# Patient Record
Sex: Male | Born: 1955 | Race: White | Hispanic: No | Marital: Single | Smoking: Never smoker
Health system: Southern US, Community
[De-identification: ages and names within clinical notes are randomized; demographics above are authoritative.]

## PROBLEM LIST (undated history)

## (undated) DIAGNOSIS — F419 Anxiety disorder, unspecified: Secondary | ICD-10-CM

## (undated) DIAGNOSIS — G709 Myoneural disorder, unspecified: Secondary | ICD-10-CM

## (undated) DIAGNOSIS — T7840XA Allergy, unspecified, initial encounter: Secondary | ICD-10-CM

## (undated) DIAGNOSIS — E785 Hyperlipidemia, unspecified: Secondary | ICD-10-CM

## (undated) DIAGNOSIS — K047 Periapical abscess without sinus: Secondary | ICD-10-CM

## (undated) DIAGNOSIS — D649 Anemia, unspecified: Secondary | ICD-10-CM

## (undated) DIAGNOSIS — Z8489 Family history of other specified conditions: Secondary | ICD-10-CM

## (undated) DIAGNOSIS — R0602 Shortness of breath: Secondary | ICD-10-CM

## (undated) DIAGNOSIS — Z9289 Personal history of other medical treatment: Secondary | ICD-10-CM

## (undated) DIAGNOSIS — R51 Headache: Secondary | ICD-10-CM

## (undated) DIAGNOSIS — Z87442 Personal history of urinary calculi: Secondary | ICD-10-CM

## (undated) DIAGNOSIS — C801 Malignant (primary) neoplasm, unspecified: Secondary | ICD-10-CM

## (undated) DIAGNOSIS — I10 Essential (primary) hypertension: Secondary | ICD-10-CM

## (undated) HISTORY — DX: Hyperlipidemia, unspecified: E78.5

## (undated) HISTORY — PX: FRACTURE SURGERY: SHX138

## (undated) HISTORY — DX: Periapical abscess without sinus: K04.7

## (undated) HISTORY — DX: Malignant (primary) neoplasm, unspecified: C80.1

## (undated) HISTORY — PX: PILONIDAL CYST EXCISION: SHX744

## (undated) HISTORY — DX: Allergy, unspecified, initial encounter: T78.40XA

## (undated) HISTORY — DX: Anemia, unspecified: D64.9

## (undated) HISTORY — DX: Essential (primary) hypertension: I10

---

## 2000-01-12 ENCOUNTER — Emergency Department (HOSPITAL_COMMUNITY): Admission: EM | Admit: 2000-01-12 | Discharge: 2000-01-12 | Payer: Self-pay | Admitting: Emergency Medicine

## 2003-04-14 ENCOUNTER — Encounter: Admission: RE | Admit: 2003-04-14 | Discharge: 2003-07-13 | Payer: Self-pay | Admitting: Family Medicine

## 2006-01-01 DIAGNOSIS — G622 Polyneuropathy due to other toxic agents: Secondary | ICD-10-CM | POA: Insufficient documentation

## 2011-08-09 DIAGNOSIS — M1710 Unilateral primary osteoarthritis, unspecified knee: Secondary | ICD-10-CM | POA: Insufficient documentation

## 2011-08-09 DIAGNOSIS — B351 Tinea unguium: Secondary | ICD-10-CM | POA: Insufficient documentation

## 2011-12-19 LAB — HM COLONOSCOPY

## 2011-12-20 ENCOUNTER — Encounter (INDEPENDENT_AMBULATORY_CARE_PROVIDER_SITE_OTHER): Payer: Self-pay | Admitting: Surgery

## 2011-12-23 ENCOUNTER — Encounter (INDEPENDENT_AMBULATORY_CARE_PROVIDER_SITE_OTHER): Payer: Self-pay | Admitting: Surgery

## 2011-12-23 ENCOUNTER — Ambulatory Visit (INDEPENDENT_AMBULATORY_CARE_PROVIDER_SITE_OTHER): Payer: 59 | Admitting: Surgery

## 2011-12-23 VITALS — BP 160/74 | HR 104 | Temp 97.4°F | Ht 71.0 in | Wt >= 6400 oz

## 2011-12-23 DIAGNOSIS — C189 Malignant neoplasm of colon, unspecified: Secondary | ICD-10-CM

## 2011-12-23 DIAGNOSIS — C187 Malignant neoplasm of sigmoid colon: Secondary | ICD-10-CM | POA: Insufficient documentation

## 2011-12-23 DIAGNOSIS — Z85038 Personal history of other malignant neoplasm of large intestine: Secondary | ICD-10-CM | POA: Insufficient documentation

## 2011-12-23 NOTE — Progress Notes (Signed)
Patient ID: Brandon Schaefer, male   DOB: Sep 25, 1955, 56 y.o.   MRN: [id number]  Chief Complaint  Patient presents with  . Pre-op Exam    eval colon lesion    HPI Bruno Leach is a 56 y.o. male.   HPI This is a pleasant gentleman referred by Dr. Evette Cristal and Dr. 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 The patient presented with anemia and heme positive stool and was found on endoscopy to have a significant mass in the sigmoid colon. Biopsy showed adenocarcinoma. He currently denies abdominal pain. He has no obstructive symptoms. Past Medical History  Diagnosis Date  . Hypertension   . Diabetes mellitus   . Allergy   . Hyperlipidemia   . Anemia   . Cancer     History reviewed. No pertinent past surgical history.  Family History  Problem Relation Age of Onset  . Diabetes Father   . Cancer Maternal Aunt     colon  . Cancer Maternal Grandmother     colon    Social History History  Substance Use Topics  . Smoking status: Never Smoker   . Smokeless tobacco: Not on file  . Alcohol Use: No    No Known Allergies  Current Outpatient Prescriptions  Medication Sig Dispense Refill  . ACCU-CHEK AVIVA PLUS test strip       . Aspirin-Acetaminophen-Caffeine (EXCEDRIN PO) Take by mouth.      [name] CARVEDILOL PO Take by mouth.      . Cetirizine HCl (ZYRTEC PO) Take by mouth.      [name] GABAPENTIN PO Take by mouth.      [name] HYDROcodone-acetaminophen (VICODIN) 5-500 MG per tablet Take 1 tablet by mouth every 6 (six) hours as needed.      . pioglitazone-metformin (ACTOPLUS MET) 15-850 MG per tablet       . ramipril (ALTACE) 10 MG capsule       . Rosuvastatin Calcium (CRESTOR PO) Take by mouth.      . SERTRALINE HCL PO Take by mouth.      . TRADJENTA 5 MG TABS tablet         Review of Systems Review of Systems  Constitutional: Negative for fever, chills and unexpected weight change.  HENT: Negative for hearing loss, congestion, sore throat, trouble  swallowing and voice change.   Eyes: Negative for visual disturbance.  Respiratory: Negative for cough and wheezing.   Cardiovascular: Negative for chest pain, palpitations and leg swelling.  Gastrointestinal: Positive for blood in stool. Negative for nausea, vomiting, abdominal pain, diarrhea, constipation, abdominal distention, anal bleeding and rectal pain.  Genitourinary: Negative for hematuria and difficulty urinating.  Musculoskeletal: Positive for arthralgias.  Skin: Negative for rash and wound.  Neurological: Negative for seizures, syncope, weakness and headaches.  Hematological: Negative for adenopathy. Does not bruise/bleed easily.  Psychiatric/Behavioral: Negative for confusion.    Blood pressure 160/74, pulse 104, temperature 97.4 F (36.3 C), temperature source Temporal, height 5\' 11"  (1.803 m), weight 452 lb 12.8 oz (205.389 kg), SpO2 95.00%.  Physical Exam Physical Exam  Constitutional: He is oriented to person, place, and time. No distress.       Morbidly obese  HENT:  Head: Normocephalic and atraumatic.  Right Ear: External ear normal.  Left Ear: External ear normal.  Nose: Nose normal.  Mouth/Throat: Oropharynx is clear and moist. No oropharyngeal exudate.  Eyes: Conjunctivae are normal. Pupils are equal, round, and reactive to light. Right eye exhibits no  discharge. Left eye exhibits no discharge. No scleral icterus.  Neck: Normal range of motion. Neck supple. No tracheal deviation present. No thyromegaly present.  Cardiovascular: Normal rate, regular rhythm and intact distal pulses.   Murmur heard. Pulmonary/Chest: Effort normal. No respiratory distress. He has no wheezes. He has no rales.  Abdominal: Soft. Bowel sounds are normal. He exhibits no distension and no mass. There is no tenderness. There is no rebound and no guarding.  Musculoskeletal: Normal range of motion. He exhibits edema. He exhibits no tenderness.  Lymphadenopathy:    He has no cervical  adenopathy.  Neurological: He is alert and oriented to person, place, and time.  Skin: Skin is warm and dry. No rash noted. He is not diaphoretic. No erythema.  Psychiatric: His behavior is normal. Judgment normal.    Data Reviewed I have reviewed the colonoscopy report and the pictures of the mass as well as the biopsy results showing invasive adenocarcinoma  Assessment    Sigmoid colon cancer    Plan    Laparoscopic-assisted partial colectomy is recommended.  I discussed this with the patient in detail and gave him literature regarding it. I discussed the risks of surgery which includes but is not limited to bleeding, infection, injury to surrounding structures, need to convert to an open procedure, anastomotic leak, anastomotic breakdown, need for ostomy, cardiopulmonary issues and DVT especially in light of his extreme morbid obesity. Preoperative bowel preparation will be given. Surgery will be scheduled. Likelihood of success is moderate.       Burtis Imhoff A 12/23/2011, 3:03 PM

## 2012-01-08 ENCOUNTER — Encounter (INDEPENDENT_AMBULATORY_CARE_PROVIDER_SITE_OTHER): Payer: Self-pay

## 2012-01-10 ENCOUNTER — Encounter (HOSPITAL_COMMUNITY): Payer: Self-pay | Admitting: Pharmacy Technician

## 2012-01-14 ENCOUNTER — Encounter (HOSPITAL_COMMUNITY): Payer: Self-pay

## 2012-01-14 ENCOUNTER — Encounter (HOSPITAL_COMMUNITY)
Admission: RE | Admit: 2012-01-14 | Discharge: 2012-01-14 | Disposition: A | Discharge status: Home / Self Care | Payer: 59 | Source: Ambulatory Visit | Attending: Surgery | Admitting: Surgery

## 2012-01-14 ENCOUNTER — Ambulatory Visit (HOSPITAL_COMMUNITY)
Admission: RE | Admit: 2012-01-14 | Discharge: 2012-01-14 | Disposition: A | Discharge status: Home / Self Care | Payer: 59 | Source: Ambulatory Visit | Attending: Surgery | Admitting: Surgery

## 2012-01-14 DIAGNOSIS — I10 Essential (primary) hypertension: Secondary | ICD-10-CM | POA: Insufficient documentation

## 2012-01-14 DIAGNOSIS — Z01810 Encounter for preprocedural cardiovascular examination: Secondary | ICD-10-CM | POA: Insufficient documentation

## 2012-01-14 DIAGNOSIS — R0602 Shortness of breath: Secondary | ICD-10-CM | POA: Insufficient documentation

## 2012-01-14 DIAGNOSIS — Z01818 Encounter for other preprocedural examination: Secondary | ICD-10-CM | POA: Insufficient documentation

## 2012-01-14 DIAGNOSIS — E119 Type 2 diabetes mellitus without complications: Secondary | ICD-10-CM | POA: Insufficient documentation

## 2012-01-14 DIAGNOSIS — Z01812 Encounter for preprocedural laboratory examination: Secondary | ICD-10-CM | POA: Insufficient documentation

## 2012-01-14 HISTORY — DX: Headache: R51

## 2012-01-14 HISTORY — DX: Anxiety disorder, unspecified: F41.9

## 2012-01-14 HISTORY — DX: Myoneural disorder, unspecified: G70.9

## 2012-01-14 HISTORY — DX: Shortness of breath: R06.02

## 2012-01-14 LAB — CBC
Hemoglobin: 7.5 g/dL — ABNORMAL LOW (ref 13.0–17.0)
MCH: 19.5 pg — ABNORMAL LOW (ref 26.0–34.0)
MCHC: 28 g/dL — ABNORMAL LOW (ref 30.0–36.0)
Platelets: 360 10*3/uL (ref 150–400)
RBC: 3.84 MIL/uL — ABNORMAL LOW (ref 4.22–5.81)

## 2012-01-14 LAB — BASIC METABOLIC PANEL
Calcium: 9.3 mg/dL (ref 8.4–10.5)
GFR calc non Af Amer: >90 mL/min (ref >90)
Glucose, Bld: 150 mg/dL — ABNORMAL HIGH (ref 70–99)
Potassium: 4.1 mEq/L (ref 3.5–5.1)
Sodium: 141 mEq/L (ref 135–145)

## 2012-01-14 LAB — SURGICAL PCR SCREEN
MRSA, PCR: NEGATIVE
Staphylococcus aureus: NEGATIVE

## 2012-01-14 NOTE — Pre-Procedure Instructions (Signed)
Stated at PST visit- "thinks is on 2 diabetes meds but isnt sure". Patients pharmacy called by pharmacy tech today for verification. Was told he is on combination pill and metformin hasnt been filled since April.  Spoke with patient at 1630 and requested call back to me tomorrow after he checks his bottles

## 2012-01-14 NOTE — Patient Instructions (Addendum)
Brandon Schaefer  01/14/2012   Your procedure is scheduled on:  01/17/12   Friday  Surgery   [id number]  Report to [facility] at  0700     AM.  Call this number if you have problems the morning of surgery: [phone]         Remember: HAVE SNACK OF LIQUID CALORIES BEFORE BEDTIME OR MIDNIGHT   THEN NONE  Do not eat food  Or drink :After Midnight. Thursday NIGHT                                                         BOWEL PREP AS PER OFFICE                              INCREASE CLEAR LIQUIDS ON THURSDAY   Take these medicines the morning of surgery with A SIP OF WATER: Zoloft, Carvedilol, Neurontin                      Flonase  VICODIN IF NEEDED   .DO NOT TAKE ANY BLOOD SUGAR MEDICINE MORNING OF SURGERY  Contacts, dentures or partial plates can not be worn to surgery  Leave suitcase in the car. After surgery it may be brought to your room.  For patients admitted to the hospital, checkout time is 11:00 AM day of  discharge.             SPECIAL INSTRUCTIONS- SEE [place] PREPARING FOR SURGERY INSTRUCTION SHEET-     DO NOT WEAR JEWELRY, LOTIONS, POWDERS, OR PERFUMES.  WOMEN-- DO NOT SHAVE LEGS OR UNDERARMS FOR 12 HOURS BEFORE SHOWERS. MEN MAY SHAVE FACE.  Patients discharged the day of surgery will not be allowed to drive home. IF going home the day of surgery, you must have a driver and someone to stay with you for the first 24 hours  Name and phone number of your driver:                                                                        Please read over the following fact sheets that you were given: MRSA Information, Incentive Spirometry Sheet, Blood Transfusion Sheet  Information                                                                                 Verbalizes understanding of liquid snack before bed or midnight Thurs night  [name]  PST [phone]  [phone]

## 2012-01-14 NOTE — Pre-Procedure Instructions (Signed)
Nikki/portable equipment notified of need for bariatric bed day of surgery and time of surgery

## 2012-01-14 NOTE — Pre-Procedure Instructions (Signed)
According to [facility]- Dr Magnus Ivan reviewed abnormal CBC at 1133

## 2012-01-14 NOTE — Progress Notes (Signed)
01/14/12 0855  OBSTRUCTIVE SLEEP APNEA  Have you ever been diagnosed with sleep apnea through a sleep study? No  Do you snore loudly (loud enough to be heard through closed doors)?  1  Do you often feel tired, fatigued, or sleepy during the daytime? 0  Has anyone observed you stop breathing during your sleep? 0  Do you have, or are you being treated for high blood pressure? 1  BMI more than 35 kg/m2? 1  Age over 59 years old? 1  Neck circumference greater than 40 cm/18 inches? 1  Gender: 1  Obstructive Sleep Apnea Score 6   Score 4 or greater  Results sent to PCP

## 2012-01-15 LAB — CEA: CEA: 2.3 ng/mL (ref 0.0–5.0)

## 2012-01-16 NOTE — H&P (Signed)
Patient ID: Brandon Schaefer, male DOB: Jun 29, 1955, 56 y.o. MRN: [id number]  Chief Complaint   Patient presents with   .  Pre-op Exam     eval colon lesion    HPI  Brandon Schaefer is a 56 y.o. male.  HPI  This is a pleasant gentleman referred by Dr. Evette Cristal and Dr. 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 The patient presented with anemia and heme positive stool and was found on endoscopy to have a significant mass in the sigmoid colon. Biopsy showed adenocarcinoma. He currently denies abdominal pain. He has no obstructive symptoms.  Past Medical History   Diagnosis  Date   .  Hypertension    .  Diabetes mellitus    .  Allergy    .  Hyperlipidemia    .  Anemia    .  Cancer     History reviewed. No pertinent past surgical history.  Family History   Problem  Relation  Age of Onset   .  Diabetes  Father    .  Cancer  Maternal Aunt       colon    .  Cancer  Maternal Grandmother       colon    Social History  History   Substance Use Topics   .  Smoking status:  Never Smoker   .  Smokeless tobacco:  Not on file   .  Alcohol Use:  No    No Known Allergies  Current Outpatient Prescriptions   Medication  Sig  Dispense  Refill   .  ACCU-CHEK AVIVA PLUS test strip      .  Aspirin-Acetaminophen-Caffeine (EXCEDRIN PO)  Take by mouth.     [name]  CARVEDILOL PO  Take by mouth.     .  Cetirizine HCl (ZYRTEC PO)  Take by mouth.     [name]  GABAPENTIN PO  Take by mouth.     [name]  HYDROcodone-acetaminophen (VICODIN) 5-500 MG per tablet  Take 1 tablet by mouth every 6 (six) hours as needed.     .  pioglitazone-metformin (ACTOPLUS MET) 15-850 MG per tablet      .  ramipril (ALTACE) 10 MG capsule      .  Rosuvastatin Calcium (CRESTOR PO)  Take by mouth.     .  SERTRALINE HCL PO  Take by mouth.     .  TRADJENTA 5 MG TABS tablet       Review of Systems  Review of Systems  Constitutional: Negative for fever, chills and unexpected weight change.  HENT: Negative for  hearing loss, congestion, sore throat, trouble swallowing and voice change.  Eyes: Negative for visual disturbance.  Respiratory: Negative for cough and wheezing.  Cardiovascular: Negative for chest pain, palpitations and leg swelling.  Gastrointestinal: Positive for blood in stool. Negative for nausea, vomiting, abdominal pain, diarrhea, constipation, abdominal distention, anal bleeding and rectal pain.  Genitourinary: Negative for hematuria and difficulty urinating.  Musculoskeletal: Positive for arthralgias.  Skin: Negative for rash and wound.  Neurological: Negative for seizures, syncope, weakness and headaches.  Hematological: Negative for adenopathy. Does not bruise/bleed easily.  Psychiatric/Behavioral: Negative for confusion.   Blood pressure 160/74, pulse 104, temperature 97.4 F (36.3 C), temperature source Temporal, height 5\' 11"  (1.803 m), weight 452 lb 12.8 oz (205.389 kg), SpO2 95.00%.  Physical Exam  Physical Exam  Constitutional: He is oriented to person, place, and time. No distress.  Morbidly obese  HENT:  Head: Normocephalic and atraumatic.  Right Ear: External ear normal.  Left Ear: External ear normal.  Nose: Nose normal.  Mouth/Throat: Oropharynx is clear and moist. No oropharyngeal exudate.  Eyes: Conjunctivae are normal. Pupils are equal, round, and reactive to light. Right eye exhibits no discharge. Left eye exhibits no discharge. No scleral icterus.  Neck: Normal range of motion. Neck supple. No tracheal deviation present. No thyromegaly present.  Cardiovascular: Normal rate, regular rhythm and intact distal pulses.  Murmur heard.  Pulmonary/Chest: Effort normal. No respiratory distress. He has no wheezes. He has no rales.  Abdominal: Soft. Bowel sounds are normal. He exhibits no distension and no mass. There is no tenderness. There is no rebound and no guarding.  Musculoskeletal: Normal range of motion. He exhibits edema. He exhibits no tenderness.    Lymphadenopathy:  He has no cervical adenopathy.  Neurological: He is alert and oriented to person, place, and time.  Skin: Skin is warm and dry. No rash noted. He is not diaphoretic. No erythema.  Psychiatric: His behavior is normal. Judgment normal.   Data Reviewed  I have reviewed the colonoscopy report and the pictures of the mass as well as the biopsy results showing invasive adenocarcinoma  Assessment   Sigmoid colon cancer   Plan   Laparoscopic-assisted partial colectomy is recommended. I discussed this with the patient in detail and gave him literature regarding it. I discussed the risks of surgery which includes but is not limited to bleeding, infection, injury to surrounding structures, need to convert to an open procedure, anastomotic leak, anastomotic breakdown, need for ostomy, cardiopulmonary issues and DVT especially in light of his extreme morbid obesity. Preoperative bowel preparation will be given. Surgery will be scheduled. Likelihood of success is moderate.   Chesni Vos A

## 2012-01-17 ENCOUNTER — Ambulatory Visit (HOSPITAL_COMMUNITY): Payer: 59 | Admitting: Anesthesiology

## 2012-01-17 ENCOUNTER — Encounter (HOSPITAL_COMMUNITY): Payer: Self-pay | Admitting: Anesthesiology

## 2012-01-17 ENCOUNTER — Inpatient Hospital Stay (HOSPITAL_COMMUNITY): Payer: 59

## 2012-01-17 ENCOUNTER — Encounter (HOSPITAL_COMMUNITY): Payer: Self-pay | Admitting: *Deleted

## 2012-01-17 ENCOUNTER — Inpatient Hospital Stay (HOSPITAL_COMMUNITY)
Admission: RE | Admit: 2012-01-17 | Discharge: 2012-01-24 | DRG: 329 | Disposition: A | Discharge status: Home Health | Payer: 59 | Source: Ambulatory Visit | Attending: Surgery | Admitting: Surgery

## 2012-01-17 ENCOUNTER — Encounter (HOSPITAL_COMMUNITY)
Admission: RE | Disposition: A | Discharge status: Home Health | Payer: Self-pay | Source: Ambulatory Visit | Attending: Surgery

## 2012-01-17 DIAGNOSIS — Z800 Family history of malignant neoplasm of digestive organs: Secondary | ICD-10-CM

## 2012-01-17 DIAGNOSIS — C189 Malignant neoplasm of colon, unspecified: Secondary | ICD-10-CM

## 2012-01-17 DIAGNOSIS — J95821 Acute postprocedural respiratory failure: Secondary | ICD-10-CM | POA: Diagnosis not present

## 2012-01-17 DIAGNOSIS — I498 Other specified cardiac arrhythmias: Secondary | ICD-10-CM | POA: Diagnosis not present

## 2012-01-17 DIAGNOSIS — C187 Malignant neoplasm of sigmoid colon: Principal | ICD-10-CM | POA: Diagnosis present

## 2012-01-17 DIAGNOSIS — Z85038 Personal history of other malignant neoplasm of large intestine: Secondary | ICD-10-CM | POA: Diagnosis present

## 2012-01-17 DIAGNOSIS — I152 Hypertension secondary to endocrine disorders: Secondary | ICD-10-CM

## 2012-01-17 DIAGNOSIS — J9589 Other postprocedural complications and disorders of respiratory system, not elsewhere classified: Secondary | ICD-10-CM

## 2012-01-17 DIAGNOSIS — I10 Essential (primary) hypertension: Secondary | ICD-10-CM

## 2012-01-17 DIAGNOSIS — K560 Paralytic ileus: Secondary | ICD-10-CM | POA: Diagnosis not present

## 2012-01-17 DIAGNOSIS — Z5331 Laparoscopic surgical procedure converted to open procedure: Secondary | ICD-10-CM

## 2012-01-17 DIAGNOSIS — E1169 Type 2 diabetes mellitus with other specified complication: Secondary | ICD-10-CM

## 2012-01-17 DIAGNOSIS — K921 Melena: Secondary | ICD-10-CM | POA: Diagnosis present

## 2012-01-17 DIAGNOSIS — E785 Hyperlipidemia, unspecified: Secondary | ICD-10-CM | POA: Diagnosis present

## 2012-01-17 DIAGNOSIS — E119 Type 2 diabetes mellitus without complications: Secondary | ICD-10-CM | POA: Diagnosis present

## 2012-01-17 DIAGNOSIS — Z6841 Body Mass Index (BMI) 40.0 and over, adult: Secondary | ICD-10-CM

## 2012-01-17 DIAGNOSIS — R609 Edema, unspecified: Secondary | ICD-10-CM | POA: Diagnosis present

## 2012-01-17 DIAGNOSIS — Z7982 Long term (current) use of aspirin: Secondary | ICD-10-CM

## 2012-01-17 DIAGNOSIS — E669 Obesity, unspecified: Secondary | ICD-10-CM

## 2012-01-17 DIAGNOSIS — D630 Anemia in neoplastic disease: Secondary | ICD-10-CM | POA: Diagnosis present

## 2012-01-17 HISTORY — PX: PROCTOSCOPY: SHX2266

## 2012-01-17 HISTORY — PX: PARTIAL COLECTOMY: SHX5273

## 2012-01-17 LAB — BLOOD GAS, ARTERIAL
Acid-base deficit: 1.2 mmol/L (ref 0.0–2.0)
Drawn by: 340271
MECHVT: 620 mL
RATE: 14 resp/min
pCO2 arterial: 56.5 mmHg — ABNORMAL HIGH (ref 35.0–45.0)
pH, Arterial: 7.276 — ABNORMAL LOW (ref 7.350–7.450)

## 2012-01-17 LAB — CBC
MCH: 20.7 pg — ABNORMAL LOW (ref 26.0–34.0)
Platelets: 347 10*3/uL (ref 150–400)
RBC: 4.15 MIL/uL — ABNORMAL LOW (ref 4.22–5.81)

## 2012-01-17 LAB — GLUCOSE, CAPILLARY: Glucose-Capillary: 162 mg/dL — ABNORMAL HIGH (ref 70–99)

## 2012-01-17 SURGERY — LAPAROSCOPIC PARTIAL COLECTOMY
Anesthesia: General | Wound class: Clean Contaminated

## 2012-01-17 MED ORDER — ALVIMOPAN 12 MG PO CAPS
12.0000 mg | ORAL_CAPSULE | Freq: Two times a day (BID) | ORAL | Status: DC
  Administered 2012-01-18 – 2012-01-19 (×4): 12 mg via ORAL
  Filled 2012-01-17 (×6): qty 1

## 2012-01-17 MED ORDER — SUFENTANIL CITRATE 50 MCG/ML IV SOLN
INTRAVENOUS | Status: DC | PRN
  Administered 2012-01-17 (×2): 5 ug via INTRAVENOUS
  Administered 2012-01-17 (×3): 10 ug via INTRAVENOUS
  Administered 2012-01-17 (×2): 5 ug via INTRAVENOUS
  Administered 2012-01-17 (×3): 10 ug via INTRAVENOUS
  Administered 2012-01-17: 5 ug via INTRAVENOUS
  Administered 2012-01-17: 10 ug via INTRAVENOUS

## 2012-01-17 MED ORDER — 0.9 % SODIUM CHLORIDE (POUR BTL) OPTIME
TOPICAL | Status: DC | PRN
  Administered 2012-01-17: 2000 mL

## 2012-01-17 MED ORDER — ONDANSETRON HCL 4 MG/2ML IJ SOLN: 4.0000 mg | Freq: Four times a day (QID) | INTRAMUSCULAR | Status: DC | PRN

## 2012-01-17 MED ORDER — LIP MEDEX EX OINT
1.0000 "application " | TOPICAL_OINTMENT | Freq: Two times a day (BID) | CUTANEOUS | Status: DC
  Administered 2012-01-17 – 2012-01-23 (×11): 1 via TOPICAL
  Filled 2012-01-17: qty 7

## 2012-01-17 MED ORDER — PANTOPRAZOLE SODIUM 40 MG IV SOLR
40.0000 mg | Freq: Every day | INTRAVENOUS | Status: DC
  Administered 2012-01-17 – 2012-01-19 (×3): 40 mg via INTRAVENOUS
  Filled 2012-01-17 (×4): qty 40

## 2012-01-17 MED ORDER — ACETAMINOPHEN 10 MG/ML IV SOLN
INTRAVENOUS | Status: DC | PRN
  Administered 2012-01-17: 1000 mg via INTRAVENOUS

## 2012-01-17 MED ORDER — ALVIMOPAN 12 MG PO CAPS
12.0000 mg | ORAL_CAPSULE | Freq: Once | ORAL | Status: AC
  Administered 2012-01-17: 12 mg via ORAL

## 2012-01-17 MED ORDER — POTASSIUM CHLORIDE IN NACL 20-0.9 MEQ/L-% IV SOLN
INTRAVENOUS | Status: DC
  Administered 2012-01-17: 15:00:00 via INTRAVENOUS
  Administered 2012-01-18: 50 mL via INTRAVENOUS
  Administered 2012-01-18 – 2012-01-21 (×4): via INTRAVENOUS
  Filled 2012-01-17 (×11): qty 1000

## 2012-01-17 MED ORDER — SODIUM CHLORIDE 0.9 % IV SOLN
INTRAVENOUS | Status: AC
  Filled 2012-01-17: qty 1

## 2012-01-17 MED ORDER — DIPHENHYDRAMINE HCL 50 MG/ML IJ SOLN
12.5000 mg | Freq: Four times a day (QID) | INTRAMUSCULAR | Status: DC | PRN
Start: 2012-01-17 — End: 2012-01-18

## 2012-01-17 MED ORDER — FENTANYL CITRATE 0.05 MG/ML IJ SOLN
25.0000 ug | INTRAMUSCULAR | Status: DC | PRN
  Administered 2012-01-17: 100 ug via INTRAVENOUS
  Filled 2012-01-17 (×2): qty 2

## 2012-01-17 MED ORDER — HYDROMORPHONE HCL PF 1 MG/ML IJ SOLN: 1.0000 mg | INTRAMUSCULAR | Status: DC | PRN

## 2012-01-17 MED ORDER — MIDAZOLAM HCL 5 MG/5ML IJ SOLN
INTRAMUSCULAR | Status: DC | PRN
  Administered 2012-01-17: 2 mg via INTRAVENOUS

## 2012-01-17 MED ORDER — PHENOL 1.4 % MT LIQD
2.0000 | OROMUCOSAL | Status: DC | PRN
  Filled 2012-01-17: qty 177

## 2012-01-17 MED ORDER — SODIUM CHLORIDE 0.9 % IJ SOLN: 9.0000 mL | INTRAMUSCULAR | Status: DC | PRN

## 2012-01-17 MED ORDER — LIDOCAINE HCL (CARDIAC) 20 MG/ML IV SOLN
INTRAVENOUS | Status: DC | PRN
  Administered 2012-01-17: 60 mg via INTRAVENOUS

## 2012-01-17 MED ORDER — PROPOFOL 10 MG/ML IV BOLUS
INTRAVENOUS | Status: DC | PRN
  Administered 2012-01-17: 20 mg via INTRAVENOUS

## 2012-01-17 MED ORDER — ALVIMOPAN 12 MG PO CAPS
ORAL_CAPSULE | ORAL | Status: AC
  Filled 2012-01-17: qty 1

## 2012-01-17 MED ORDER — MIDAZOLAM HCL 5 MG/ML IJ SOLN: 1.0000 mg | INTRAMUSCULAR | Status: DC | PRN

## 2012-01-17 MED ORDER — FENTANYL CITRATE 0.05 MG/ML IJ SOLN
25.0000 ug | INTRAMUSCULAR | Status: DC | PRN
  Administered 2012-01-17: 100 ug via INTRAVENOUS
  Filled 2012-01-17: qty 2

## 2012-01-17 MED ORDER — SODIUM CHLORIDE 0.9 % IV SOLN
INTRAVENOUS | Status: DC | PRN
  Administered 2012-01-17: 11:00:00 via INTRAVENOUS

## 2012-01-17 MED ORDER — PROMETHAZINE HCL 25 MG/ML IJ SOLN: 6.2500 mg | INTRAMUSCULAR | Status: DC | PRN

## 2012-01-17 MED ORDER — LACTATED RINGERS IV SOLN: INTRAVENOUS | Status: DC

## 2012-01-17 MED ORDER — ENOXAPARIN SODIUM 40 MG/0.4ML ~~LOC~~ SOLN
40.0000 mg | Freq: Two times a day (BID) | SUBCUTANEOUS | Status: DC
  Filled 2012-01-17 (×3): qty 0.4

## 2012-01-17 MED ORDER — METOCLOPRAMIDE HCL 5 MG/ML IJ SOLN
INTRAMUSCULAR | Status: DC | PRN
  Administered 2012-01-17: 10 mg via INTRAVENOUS

## 2012-01-17 MED ORDER — SUCCINYLCHOLINE CHLORIDE 20 MG/ML IJ SOLN
INTRAMUSCULAR | Status: DC | PRN
  Administered 2012-01-17: 160 mg via INTRAVENOUS

## 2012-01-17 MED ORDER — ENOXAPARIN SODIUM 40 MG/0.4ML ~~LOC~~ SOLN: 40.0000 mg | SUBCUTANEOUS | Status: DC

## 2012-01-17 MED ORDER — ROCURONIUM BROMIDE 100 MG/10ML IV SOLN
INTRAVENOUS | Status: DC | PRN
  Administered 2012-01-17: 5 mg via INTRAVENOUS

## 2012-01-17 MED ORDER — BUPIVACAINE HCL (PF) 0.5 % IJ SOLN
INTRAMUSCULAR | Status: AC
  Filled 2012-01-17: qty 30

## 2012-01-17 MED ORDER — DIPHENHYDRAMINE HCL 12.5 MG/5ML PO ELIX: 12.5000 mg | ORAL_SOLUTION | Freq: Four times a day (QID) | ORAL | Status: DC | PRN

## 2012-01-17 MED ORDER — CISATRACURIUM BESYLATE (PF) 10 MG/5ML IV SOLN
INTRAVENOUS | Status: DC | PRN
  Administered 2012-01-17: 6 mg via INTRAVENOUS
  Administered 2012-01-17: 2 mg via INTRAVENOUS
  Administered 2012-01-17: 3 mg via INTRAVENOUS
  Administered 2012-01-17 (×2): 4 mg via INTRAVENOUS
  Administered 2012-01-17 (×2): 6 mg via INTRAVENOUS

## 2012-01-17 MED ORDER — SODIUM CHLORIDE 0.9 % IV SOLN
1.0000 g | INTRAVENOUS | Status: AC
  Administered 2012-01-18: 1 g via INTRAVENOUS
  Filled 2012-01-17: qty 1

## 2012-01-17 MED ORDER — LACTATED RINGERS IV SOLN
INTRAVENOUS | Status: DC | PRN
  Administered 2012-01-17 (×3): via INTRAVENOUS

## 2012-01-17 MED ORDER — ACETAMINOPHEN 10 MG/ML IV SOLN
INTRAVENOUS | Status: AC
  Filled 2012-01-17: qty 100

## 2012-01-17 MED ORDER — SODIUM CHLORIDE 0.9 % IV SOLN
1.0000 g | INTRAVENOUS | Status: AC
  Administered 2012-01-17: 1 g via INTRAVENOUS

## 2012-01-17 MED ORDER — HYDROMORPHONE 0.3 MG/ML IV SOLN
INTRAVENOUS | Status: DC
  Administered 2012-01-17: 2.1 mg via INTRAVENOUS
  Administered 2012-01-17: 17:00:00 via INTRAVENOUS
  Administered 2012-01-18: 0.6 mg via INTRAVENOUS
  Administered 2012-01-18: 0.9 mg via INTRAVENOUS
  Filled 2012-01-17: qty 25

## 2012-01-17 MED ORDER — DIPHENHYDRAMINE HCL 12.5 MG/5ML PO ELIX: 25.0000 mg | ORAL_SOLUTION | Freq: Four times a day (QID) | ORAL | Status: DC | PRN

## 2012-01-17 MED ORDER — BIOTENE DRY MOUTH MT LIQD
15.0000 mL | Freq: Four times a day (QID) | OROMUCOSAL | Status: DC
  Administered 2012-01-17 – 2012-01-22 (×13): 15 mL via OROMUCOSAL

## 2012-01-17 MED ORDER — ENOXAPARIN SODIUM 40 MG/0.4ML ~~LOC~~ SOLN: 40.0000 mg | Freq: Two times a day (BID) | SUBCUTANEOUS | Status: DC

## 2012-01-17 MED ORDER — MAGIC MOUTHWASH
15.0000 mL | Freq: Four times a day (QID) | ORAL | Status: DC | PRN
  Filled 2012-01-17: qty 15

## 2012-01-17 MED ORDER — LACTATED RINGERS IV SOLN
INTRAVENOUS | Status: DC | PRN
  Administered 2012-01-17 (×2): via INTRAVENOUS

## 2012-01-17 MED ORDER — NALOXONE HCL 0.4 MG/ML IJ SOLN: 0.4000 mg | INTRAMUSCULAR | Status: DC | PRN

## 2012-01-17 MED ORDER — ALUM & MAG HYDROXIDE-SIMETH 200-200-20 MG/5ML PO SUSP: 30.0000 mL | Freq: Four times a day (QID) | ORAL | Status: DC | PRN

## 2012-01-17 MED ORDER — PROMETHAZINE HCL 25 MG/ML IJ SOLN: 12.5000 mg | Freq: Four times a day (QID) | INTRAMUSCULAR | Status: DC | PRN

## 2012-01-17 MED ORDER — CHLORHEXIDINE GLUCONATE 0.12 % MT SOLN
15.0000 mL | Freq: Two times a day (BID) | OROMUCOSAL | Status: DC
  Administered 2012-01-17 – 2012-01-22 (×5): 15 mL via OROMUCOSAL
  Filled 2012-01-17 (×14): qty 15

## 2012-01-17 MED ORDER — HYDROMORPHONE HCL PF 1 MG/ML IJ SOLN: 0.2500 mg | INTRAMUSCULAR | Status: DC | PRN

## 2012-01-17 MED ORDER — MENTHOL 3 MG MT LOZG
1.0000 | LOZENGE | OROMUCOSAL | Status: DC | PRN
  Filled 2012-01-17: qty 9

## 2012-01-17 MED ORDER — METOPROLOL TARTRATE 1 MG/ML IV SOLN
5.0000 mg | Freq: Four times a day (QID) | INTRAVENOUS | Status: DC | PRN
  Filled 2012-01-17: qty 5

## 2012-01-17 MED ORDER — DIPHENHYDRAMINE HCL 50 MG/ML IJ SOLN: 12.5000 mg | Freq: Four times a day (QID) | INTRAMUSCULAR | Status: DC | PRN

## 2012-01-17 MED ORDER — ONDANSETRON HCL 4 MG PO TABS: 4.0000 mg | ORAL_TABLET | Freq: Four times a day (QID) | ORAL | Status: DC | PRN

## 2012-01-17 MED ORDER — INSULIN ASPART 100 UNIT/ML ~~LOC~~ SOLN
2.0000 [IU] | SUBCUTANEOUS | Status: DC
  Administered 2012-01-17 – 2012-01-18 (×5): 4 [IU] via SUBCUTANEOUS
  Administered 2012-01-18 – 2012-01-19 (×6): 2 [IU] via SUBCUTANEOUS
  Administered 2012-01-19: 4 [IU] via SUBCUTANEOUS
  Administered 2012-01-19: 2 [IU] via SUBCUTANEOUS
  Administered 2012-01-19: 4 [IU] via SUBCUTANEOUS
  Administered 2012-01-20: 2 [IU] via SUBCUTANEOUS
  Administered 2012-01-20: 4 [IU] via SUBCUTANEOUS
  Administered 2012-01-20: 2 [IU] via SUBCUTANEOUS
  Administered 2012-01-20 (×3): 4 [IU] via SUBCUTANEOUS
  Administered 2012-01-21 – 2012-01-22 (×11): 2 [IU] via SUBCUTANEOUS

## 2012-01-17 MED ORDER — HYDROMORPHONE HCL PF 1 MG/ML IJ SOLN
INTRAMUSCULAR | Status: DC | PRN
  Administered 2012-01-17 (×4): 0.5 mg via INTRAVENOUS

## 2012-01-17 MED ORDER — ONDANSETRON HCL 4 MG/2ML IJ SOLN
INTRAMUSCULAR | Status: DC | PRN
  Administered 2012-01-17: 4 mg via INTRAVENOUS

## 2012-01-17 SURGICAL SUPPLY — 79 items
APPLIER CLIP 5 13 M/L LIGAMAX5 (MISCELLANEOUS) ×3
APPLIER CLIP ROT 10 11.4 M/L (STAPLE) ×3
BLADE EXTENDED COATED 6.5IN (ELECTRODE) ×3 IMPLANT
BLADE HEX COATED 2.75 (ELECTRODE) ×3 IMPLANT
BLADE SURG SZ10 CARB STEEL (BLADE) ×3 IMPLANT
CABLE HIGH FREQUENCY MONO STRZ (ELECTRODE) ×3 IMPLANT
CANISTER SUCTION 2500CC (MISCELLANEOUS) ×3 IMPLANT
CELLS DAT CNTRL 66122 CELL SVR (MISCELLANEOUS) IMPLANT
CHLORAPREP W/TINT 26ML (MISCELLANEOUS) ×3 IMPLANT
CLIP APPLIE 5 13 M/L LIGAMAX5 (MISCELLANEOUS) ×2 IMPLANT
CLIP APPLIE ROT 10 11.4 M/L (STAPLE) ×2 IMPLANT
CLOTH BEACON ORANGE TIMEOUT ST (SAFETY) ×3 IMPLANT
COVER MAYO STAND STRL (DRAPES) ×3 IMPLANT
DECANTER SPIKE VIAL GLASS SM (MISCELLANEOUS) ×3 IMPLANT
DRAIN CHANNEL 19F RND (DRAIN) IMPLANT
DRAPE LAPAROSCOPIC ABDOMINAL (DRAPES) ×3 IMPLANT
DRAPE LG THREE QUARTER DISP (DRAPES) ×3 IMPLANT
DRAPE WARM FLUID 44X44 (DRAPE) ×6 IMPLANT
DRESSING TELFA 8X3 (GAUZE/BANDAGES/DRESSINGS) ×3 IMPLANT
DRSG AQUACEL AG ADV 3.5X 4 (GAUZE/BANDAGES/DRESSINGS) IMPLANT
DRSG AQUACEL AG ADV 3.5X 6 (GAUZE/BANDAGES/DRESSINGS) IMPLANT
DRSG PAD ABDOMINAL 8X10 ST (GAUZE/BANDAGES/DRESSINGS) ×3 IMPLANT
ELECT REM PT RETURN 9FT ADLT (ELECTROSURGICAL) ×3
ELECTRODE REM PT RTRN 9FT ADLT (ELECTROSURGICAL) ×2 IMPLANT
EVACUATOR DRAINAGE 10X20 100CC (DRAIN) IMPLANT
EVACUATOR SILICONE 100CC (DRAIN)
GLOVE BIO SURGEON STRL SZ7 (GLOVE) ×3 IMPLANT
GLOVE BIO SURGEON STRL SZ8 (GLOVE) ×9 IMPLANT
GLOVE BIOGEL PI IND STRL 7.0 (GLOVE) ×4 IMPLANT
GLOVE BIOGEL PI INDICATOR 7.0 (GLOVE) ×2
GLOVE ECLIPSE 6.5 STRL STRAW (GLOVE) ×12 IMPLANT
GLOVE SURG SIGNA 7.5 PF LTX (GLOVE) ×12 IMPLANT
GOWN STRL NON-REIN LRG LVL3 (GOWN DISPOSABLE) ×3 IMPLANT
GOWN STRL REIN XL XLG (GOWN DISPOSABLE) ×15 IMPLANT
HAND ACTIVATED (MISCELLANEOUS) IMPLANT
KIT BASIN OR (CUSTOM PROCEDURE TRAY) ×3 IMPLANT
LEGGING LITHOTOMY PAIR STRL (DRAPES) IMPLANT
LIGASURE IMPACT 36 18CM CVD LR (INSTRUMENTS) ×3 IMPLANT
NS IRRIG 1000ML POUR BTL (IV SOLUTION) ×3 IMPLANT
PENCIL BUTTON HOLSTER BLD 10FT (ELECTRODE) ×3 IMPLANT
REGULATOR SUCTION ADULT (MISCELLANEOUS) IMPLANT
RTRCTR WOUND ALEXIS 18CM MED (MISCELLANEOUS)
SCISSORS LAP 5X35 DISP (ENDOMECHANICALS) ×3 IMPLANT
SEALER TISSUE G2 CVD JAW 45CM (ENDOMECHANICALS) IMPLANT
SEALER TISSUE X1 CVD JAW (INSTRUMENTS) IMPLANT
SET IRRIG TUBING LAPAROSCOPIC (IRRIGATION / IRRIGATOR) ×3 IMPLANT
SOLUTION ANTI FOG 6CC (MISCELLANEOUS) ×3 IMPLANT
SPONGE GAUZE 4X4 12PLY (GAUZE/BANDAGES/DRESSINGS) ×3 IMPLANT
SPONGE LAP 18X18 X RAY DECT (DISPOSABLE) ×6 IMPLANT
STAPLER CIRC CVD 29MM 37CM (STAPLE) ×3 IMPLANT
STAPLER CUT CVD 40MM BLUE (STAPLE) ×3 IMPLANT
STAPLER VISISTAT 35W (STAPLE) ×3 IMPLANT
SUCTION POOLE TIP (SUCTIONS) ×3 IMPLANT
SUT NOV 1 T60/GS (SUTURE) ×15 IMPLANT
SUT NYLON 3 0 (SUTURE) IMPLANT
SUT PDS AB 1 CTX 36 (SUTURE) ×6 IMPLANT
SUT PDS AB 1 TP1 96 (SUTURE) ×6 IMPLANT
SUT PROLENE 2 0 CT2 30 (SUTURE) ×3 IMPLANT
SUT PROLENE 2 0 KS (SUTURE) IMPLANT
SUT SILK 2 0 (SUTURE) ×2
SUT SILK 2 0 SH CR/8 (SUTURE) ×6 IMPLANT
SUT SILK 2 0SH CR/8 30 (SUTURE) ×3 IMPLANT
SUT SILK 2-0 18XBRD TIE 12 (SUTURE) ×2 IMPLANT
SUT SILK 2-0 30XBRD TIE 12 (SUTURE) ×2 IMPLANT
SUT SILK 3 0 (SUTURE) ×1
SUT SILK 3 0 SH CR/8 (SUTURE) ×3 IMPLANT
SUT SILK 3-0 18XBRD TIE 12 (SUTURE) ×2 IMPLANT
SUT VIC AB 2-0 SH 18 (SUTURE) ×6 IMPLANT
SUT VICRYL 2 0 18  UND BR (SUTURE) ×2
SUT VICRYL 2 0 18 UND BR (SUTURE) ×4 IMPLANT
SYR 30ML LL (SYRINGE) IMPLANT
TAPE CLOTH SURG 6X10 WHT LF (GAUZE/BANDAGES/DRESSINGS) ×3 IMPLANT
TRAY FOLEY CATH 14FRSI W/METER (CATHETERS) ×3 IMPLANT
TRAY LAP CHOLE (CUSTOM PROCEDURE TRAY) ×3 IMPLANT
TROCAR XCEL BLUNT TIP 100MML (ENDOMECHANICALS) IMPLANT
TROCAR XCEL NON-BLD 5MMX100MML (ENDOMECHANICALS) ×3 IMPLANT
TUBING FILTER THERMOFLATOR (ELECTROSURGICAL) ×3 IMPLANT
YANKAUER SUCT BULB TIP 10FT TU (MISCELLANEOUS) ×3 IMPLANT
YANKAUER SUCT BULB TIP NO VENT (SUCTIONS) ×3 IMPLANT

## 2012-01-17 NOTE — Consult Note (Signed)
Name: Brandon Schaefer MRN: [id number] DOB: [birth year]    LOS: 0  Referring Provider:  [name]  Reason for Referral:  Post surgical weaning and extubation  PULMONARY / CRITICAL CARE MEDICINE  HPI:  Patient is a morbidly obese 56 year old white male with severe exertional limitations, who had difficulty walking 10 feet, just had surgery 10/4 for colon CA in the sigmoid colon. Initially the patient was supposed to have laparoscopic partial colectomy but ended up having open colectomy by Dr. Magnus Ivan. Patient admitted to the ICU for weaning and extubation.  Past Medical History  Diagnosis Date  . Hypertension   . Diabetes mellitus   . Allergy   . Hyperlipidemia   . Anemia   . Cancer   . Anxiety   . Shortness of breath     with exertion  . Headache   . Neuromuscular disorder     peripheral neuropathy   Past Surgical History  Procedure Date  . Fracture surgery      ORIF-left radius has pin  . Pilonidal cyst excision    Prior to Admission medications   Medication Sig Start Date End Date Taking? Authorizing Provider  carvedilol (COREG) 6.25 MG tablet Take 6.25 mg by mouth 2 (two) times daily with a meal.   Yes Historical Provider, MD  fluticasone (FLONASE) 50 MCG/ACT nasal spray Place 2 sprays into the nose daily.   Yes Historical Provider, MD  gabapentin (NEURONTIN) 400 MG capsule Take 800 mg by mouth 3 (three) times daily.   Yes Historical Provider, MD  HYDROcodone-acetaminophen (VICODIN) 5-500 MG per tablet Take 1 tablet by mouth every 6 (six) hours as needed. Pain   Yes Historical Provider, MD  ramipril (ALTACE) 10 MG capsule Take 10 mg by mouth daily before breakfast.   Yes Historical Provider, MD  rosuvastatin (CRESTOR) 20 MG tablet Take 20 mg by mouth daily before breakfast.   Yes Historical Provider, MD  sertraline (ZOLOFT) 100 MG tablet Take 150 mg by mouth daily before breakfast.   Yes Historical Provider, MD  aspirin EC 81 MG tablet Take 81 mg by mouth daily.     Historical Provider, MD  Multiple Vitamin (MULTIVITAMIN WITH MINERALS) TABS Take 1 tablet by mouth daily.    Historical Provider, MD  pioglitazone-metformin (ACTOPLUS MET) 15-850 MG per tablet Take 1 tablet by mouth 3 (three) times daily.    Historical Provider, MD   Allergies No Known Allergies  Family History Family History  Problem Relation Age of Onset  . Diabetes Father   . Cancer Maternal Aunt     colon  . Cancer Maternal Grandmother     colon   Social History  reports that he has never smoked. He has never used smokeless tobacco. He reports that he does not drink alcohol or use illicit drugs.  Review Of Systems:  All negative with exception of blood in stool prior to surgery and arthralgias.  Brief patient description:  Patient is a 56 year old morbidly obese gentleman recently diagnosed with an adenocarcinoma in the sigmoid colon. Had a colectomy 10/4.  Current Status: guarded  Vital Signs: Temp:  [99.5 F (37.5 C)] 99.5 F (37.5 C) (10/04 0709) Pulse Rate:  [91-114] 114  (10/04 1345) Resp:  [16-18] 18  (10/04 1345) BP: (152-176)/(75-81) 176/75 mmHg (10/04 1345) SpO2:  [99 %] 99 % (10/04 1345) FiO2 (%):  [80 %] 80 % (10/04 1345) Weight:  [204.572 kg (451 lb)] 204.572 kg (451 lb) (10/04 1345)  Physical Examination: General:  Morbidly  obese Neuro:  Patient's initially asleep quickly awoke now following commands HEENT:  Normocephalic atraumatic ears nose mouth normal Neck:  Neck supple no tracheal deviation no thyroidmegaly Cardiovascular:  Tachycardic slight fast murmur heard intact pulses Lungs:  Intubated diminished breath sounds in lower lobes Abdomen:  Soft hypoactive bowel sounds large midline abdominal dressing intact Musculoskeletal:  Normal range of motion no tenderness no edema noted Skin:  Intact warm and dry no rash no erythema  Active Problems:  * No active hospital problems. *    ASSESSMENT AND PLAN  PULMONARY No results found for this  basename: PHART:5,PCO2:5,PCO2ART:5,PO2ART:5,HCO3:5,O2SAT:5 in the last 168 hours Ventilator Settings: Vent Mode:  [-] PRVC FiO2 (%):  [80 %] 80 % Set Rate:  [14 bmp] 14 bmp Vt Set:  [620 mL] 620 mL PEEP:  [5 cmH20] 5 cmH20 CXR:  10/1 preop chest no acute findings ETT:  10/4 patient intubated for surgery  A:   1) Post op respiratory failure in setting of OHS/OSA and s/p open lap.  His pain and size will be major contributors and present high risk for acute respiratory failure after extubation. Will almost certainly need to have BIPAP available after extubation. Given his significant co-morbids and difficulty of his surgical case will give him over night to "let the dust settle" P:   Reduce FiO2 Full vent support Intermittent sedation  Assess in am for SBT on 10/5  CARDIOVASCULAR No results found for this basename: TROPONINI:5,LATICACIDVEN:5, O2SATVEN:5,PROBNP:5 in the last 168 hours ECG:  Sinus tachycardia Lines: Peripheral lines x2  A: Sinus tachycardia currently postop tachycardia probably due to pain P:  Monitor urine output   RENAL  Lab 01/14/12 0930  NA 141  K 4.1  CL 102  CO2 29  BUN 13  CREATININE 0.69  CALCIUM 9.3  MG --  PHOS --   Intake/Output      10/03 0701 - 10/04 0700 10/04 0701 - 10/05 0700   I.V. (mL/kg)  4400 (21.5)   Blood  700   Total Intake(mL/kg)  5100 (24.9)   Urine (mL/kg/hr)  150 (0.1)   Blood  800   Total Output  950   Net  +4150         Foley:  10/4 postop  A:  No problems noted P:   Continue to monitor  GASTROINTESTINAL No results found for this basename: AST:5,ALT:5,ALKPHOS:5,BILITOT:5,PROT:5,ALBUMIN:5 in the last 168 hours  A:  1) Patient postop colectomy in setting of adenocarcinoma of sigmoid colon 2) super obesity  P:   Place patient on PPI NPO Nutrition per surgery   HEMATOLOGIC  Lab 01/14/12 0930  HGB 7.5*  HCT 26.8*  PLT 360  INR --  APTT --   A:  Patient presenting with preop anemia.  P:  Check postop  H&H Transfuse if hemoglobin less than 7  INFECTIOUS  Lab 01/14/12 0930  WBC 7.6  PROCALCITON --   Cultures: None Antibiotics: Invanz 10/4>>>  A:  No acute infection  P:   Monitor WBC  ENDOCRINE  Lab 01/17/12 0759  GLUCAP 162*   A: Diabetes   P:   SSI Monitor BMP  NEUROLOGIC  A:  Patient is awake opens eyes follows commands has post-op pain.  P: Maintain pain control but minimize sedation  Continue to monitor   BEST PRACTICE / DISPOSITION Level of Care:  ICU Primary Service:  Critical care Consultants:  Critical care Code Status:  Full Diet:  N.p.o. DVT Px:  SCDs GI Px:  PPI  Skin Integrity:  Abdominal incision Social / Family:  Unknown  Raynor, Richard C 01/17/2012, 2:11 PM  Reviewed above, examined pt, and agree with assessment/plan.  56 yo male recently dx with colon cancer after developing heme positive stools.  Was scheduled for laparoscopic procedure, but required open colectomy.  He is super morbidly obese, and there is concern for his pulmonary status with chronic dyspnea and possible sleep disordered breathing.  He was slow to recovery from anesthesia after surgery.  As a result he remained on the ventilator post-op.  He is tolerating pressure support weaning at this time, and mental status has improved since transfer from PACU.  If he passes SBT, then may proceed with extubation later today with plan for prn BPAP after extubation.  Otherwise would plan to re-assess for extubation in AM of 10/05.  Coralyn Helling, MD [facility] Pulmonary/Critical Care 01/17/2012, 3:24 PM Pager:  [phone] After 3pm call: [phone]

## 2012-01-17 NOTE — Progress Notes (Signed)
Pt extubated to 2 L [place] - no stridor - no distress.  RT will continue to monitor.

## 2012-01-17 NOTE — Anesthesia Postprocedure Evaluation (Signed)
  Anesthesia Post-op Note  Patient: Brandon Schaefer  Procedure(s) Performed: Procedure(s) (LRB) with comments: LAPAROSCOPIC PARTIAL COLECTOMY (N/A) - attempted PARTIAL COLECTOMY () PROCTOSCOPY ()  Patient Location: ICU  Anesthesia Type: General  Level of Consciousness: sedated and Patient remains intubated per anesthesia plan  Airway and Oxygen Therapy: Patient remains intubated per anesthesia plan and Patient placed on Ventilator (see vital sign flow sheet for setting)  Post-op Pain: mild  Post-op Assessment: Post-op Vital signs reviewed  Post-op Vital Signs: stable  Complications: No apparent anesthesia complications

## 2012-01-17 NOTE — Interval H&P Note (Signed)
History and Physical Interval Note: no change in H and P  01/17/2012 9:03 AM  [name]  has presented today for surgery, with the diagnosis of colon cancer  The various methods of treatment have been discussed with the patient and family. After consideration of risks, benefits and other options for treatment, the patient has consented to  Procedure(s) (LRB) with comments: LAPAROSCOPIC PARTIAL COLECTOMY (N/A) as a surgical intervention .  The patient's history has been reviewed, patient examined, no change in status, stable for surgery.  I have reviewed the patient's chart and labs.  Questions were answered to the patient's satisfaction.     Manon Banbury A

## 2012-01-17 NOTE — Anesthesia Preprocedure Evaluation (Signed)
Anesthesia Evaluation  Patient identified by MRN, date of birth, ID band Patient awake    Reviewed: Allergy & Precautions, H&P , NPO status , Patient's Chart, lab work & pertinent test results  Airway Mallampati: III TM Distance: >3 FB Neck ROM: Full    Dental  (+) Teeth Intact and Partial Upper   Pulmonary shortness of breath, with exertion and at rest,  breath sounds clear to auscultation  Pulmonary exam normal       Cardiovascular hypertension, Pt. on medications and Pt. on home beta blockers Rhythm:Regular Rate:Normal     Neuro/Psych  Headaches, Anxiety  Neuromuscular disease negative psych ROS   GI/Hepatic Neg liver ROS, Neoplasm Colon   Endo/Other  diabetes, Type 2, Oral Hypoglycemic AgentsMorbid obesity  Renal/GU negative Renal ROS  negative genitourinary   Musculoskeletal negative musculoskeletal ROS (+)   Abdominal (+) + obese,   Peds  Hematology negative hematology ROS (+)   Anesthesia Other Findings   Reproductive/Obstetrics negative OB ROS                           Anesthesia Physical Anesthesia Plan  ASA: IV  Anesthesia Plan: General   Post-op Pain Management:    Induction: Intravenous  Airway Management Planned: Oral ETT  Additional Equipment:   Intra-op Plan:   Post-operative Plan: Possible Post-op intubation/ventilation  Informed Consent: I have reviewed the patients History and Physical, chart, labs and discussed the procedure including the risks, benefits and alternatives for the proposed anesthesia with the patient or authorized representative who has indicated his/her understanding and acceptance.   Dental advisory given  Plan Discussed with: CRNA  Anesthesia Plan Comments:         Anesthesia Quick Evaluation

## 2012-01-17 NOTE — Transfer of Care (Signed)
Immediate Anesthesia Transfer of Care Note  Patient: Brandon Schaefer  Procedure(s) Performed: Procedure(s) (LRB) with comments: LAPAROSCOPIC PARTIAL COLECTOMY (N/A) - attempted PARTIAL COLECTOMY () PROCTOSCOPY ()  Patient Location: ICU  Anesthesia Type: General  Level of Consciousness: sedated  Airway & Oxygen Therapy: Patient remains intubated per anesthesia plan  Post-op Assessment: Report given to PACU RN, Post -op Vital signs reviewed and stable  Post vital signs: Reviewed and stable  Complications: No apparent anesthesia complications

## 2012-01-17 NOTE — Op Note (Signed)
LAPAROSCOPIC PARTIAL COLECTOMY, PARTIAL COLECTOMY, PROCTOSCOPY  Procedure Note  JAKHI PARA 01/17/2012   Pre-op Diagnosis: colon cancer, morbid obesity     Post-op Diagnosis: same  Procedure(s): DIAGNOSTIC LAPAROSCOPY SIGMOID COLECTOMY PROCTOSCOPY  Surgeon(s): Shelly Rubenstein, MD Velora Heckler, MD  Anesthesia: General  Staff:  Amy Ames Dura, RN - Circulator Traci Luciana Axe, CST - Scrub Person Guadelupe Sabin, [place] - Scrub Person Arnette Norris, RN - Relief Circulator Victoriano Lain Allred, RN - Scrub Person  Estimated Blood Loss: Minimal               Specimens: sigmoid colon mass          [name]   Date: 01/17/2012  Time: 12:50 PM

## 2012-01-17 NOTE — Significant Event (Signed)
Pt did very well with SBT.  Fully alert, and following commands.  Hemodynamics stable.  Will proceed with extubation.  Will keep NG tube in place.  Will add oxygen as needed to keep SpO2 > 92%.  Will use BiPAP as needed after extubation.  Coralyn Helling, MD [facility] Pulmonary/Critical Care 01/17/2012, 3:48 PM Pager:  [phone] After 3pm call: [phone]

## 2012-01-18 ENCOUNTER — Inpatient Hospital Stay (HOSPITAL_COMMUNITY): Payer: 59

## 2012-01-18 DIAGNOSIS — J95821 Acute postprocedural respiratory failure: Secondary | ICD-10-CM

## 2012-01-18 DIAGNOSIS — E1169 Type 2 diabetes mellitus with other specified complication: Secondary | ICD-10-CM

## 2012-01-18 DIAGNOSIS — I10 Essential (primary) hypertension: Secondary | ICD-10-CM

## 2012-01-18 DIAGNOSIS — E119 Type 2 diabetes mellitus without complications: Secondary | ICD-10-CM

## 2012-01-18 DIAGNOSIS — I152 Hypertension secondary to endocrine disorders: Secondary | ICD-10-CM

## 2012-01-18 DIAGNOSIS — Z6841 Body Mass Index (BMI) 40.0 and over, adult: Secondary | ICD-10-CM

## 2012-01-18 DIAGNOSIS — C187 Malignant neoplasm of sigmoid colon: Principal | ICD-10-CM

## 2012-01-18 LAB — GLUCOSE, CAPILLARY
Glucose-Capillary: 142 mg/dL — ABNORMAL HIGH (ref 70–99)
Glucose-Capillary: 183 mg/dL — ABNORMAL HIGH (ref 70–99)
Glucose-Capillary: 186 mg/dL — ABNORMAL HIGH (ref 70–99)

## 2012-01-18 LAB — BASIC METABOLIC PANEL
BUN: 8 mg/dL (ref 6–23)
CO2: 27 mEq/L (ref 19–32)
Chloride: 100 mEq/L (ref 96–112)
GFR calc Af Amer: >90 mL/min (ref >90)
Potassium: 4.4 mEq/L (ref 3.5–5.1)

## 2012-01-18 LAB — CBC
HCT: 29 % — ABNORMAL LOW (ref 39.0–52.0)
Hemoglobin: 8.4 g/dL — ABNORMAL LOW (ref 13.0–17.0)
MCV: 70.2 fL — ABNORMAL LOW (ref 78.0–100.0)
RBC: 4.13 MIL/uL — ABNORMAL LOW (ref 4.22–5.81)
RDW: 17.1 % — ABNORMAL HIGH (ref 11.5–15.5)
WBC: 17.5 10*3/uL — ABNORMAL HIGH (ref 4.0–10.5)

## 2012-01-18 MED ORDER — FLUTICASONE PROPIONATE 50 MCG/ACT NA SUSP
2.0000 | Freq: Every day | NASAL | Status: DC
  Administered 2012-01-18 – 2012-01-23 (×6): 2 via NASAL
  Filled 2012-01-18: qty 16

## 2012-01-18 MED ORDER — GABAPENTIN 400 MG PO CAPS
800.0000 mg | ORAL_CAPSULE | Freq: Three times a day (TID) | ORAL | Status: DC
  Administered 2012-01-18 – 2012-01-23 (×18): 800 mg via ORAL
  Filled 2012-01-18 (×21): qty 2

## 2012-01-18 MED ORDER — HYDROMORPHONE 0.3 MG/ML IV SOLN
INTRAVENOUS | Status: DC
  Administered 2012-01-18: 0.9 mg via INTRAVENOUS
  Administered 2012-01-18: 14:00:00 via INTRAVENOUS
  Administered 2012-01-18 (×2): 1.5 mg via INTRAVENOUS
  Administered 2012-01-19: 1.2 mg via INTRAVENOUS
  Administered 2012-01-19: 13:00:00 via INTRAVENOUS
  Administered 2012-01-19: 1.6 mg via INTRAVENOUS
  Administered 2012-01-19: 0.9 mg via INTRAVENOUS
  Administered 2012-01-19: 1.8 mg via INTRAVENOUS
  Administered 2012-01-19: 0.9 mg via INTRAVENOUS
  Administered 2012-01-20: 1.5 mg via INTRAVENOUS
  Administered 2012-01-20: 0.3 mg via INTRAVENOUS
  Administered 2012-01-20: 0.9 mg via INTRAVENOUS
  Administered 2012-01-20: 1.2 mg via INTRAVENOUS
  Filled 2012-01-18 (×2): qty 25

## 2012-01-18 MED ORDER — CARVEDILOL 6.25 MG PO TABS
6.2500 mg | ORAL_TABLET | Freq: Two times a day (BID) | ORAL | Status: DC
  Administered 2012-01-18 – 2012-01-24 (×13): 6.25 mg via ORAL
  Filled 2012-01-18 (×15): qty 1

## 2012-01-18 MED ORDER — SERTRALINE HCL 50 MG PO TABS
150.0000 mg | ORAL_TABLET | Freq: Every day | ORAL | Status: DC
  Administered 2012-01-18 – 2012-01-24 (×7): 150 mg via ORAL
  Filled 2012-01-18 (×8): qty 1

## 2012-01-18 MED ORDER — HYDROMORPHONE HCL PF 1 MG/ML IJ SOLN
1.0000 mg | INTRAMUSCULAR | Status: DC | PRN
  Administered 2012-01-20 (×2): 1 mg via INTRAVENOUS
  Administered 2012-01-21 (×2): 2 mg via INTRAVENOUS
  Administered 2012-01-21 (×2): 1 mg via INTRAVENOUS
  Administered 2012-01-21: 2 mg via INTRAVENOUS
  Administered 2012-01-22 – 2012-01-23 (×4): 1 mg via INTRAVENOUS
  Administered 2012-01-23: 2 mg via INTRAVENOUS
  Administered 2012-01-23: 1 mg via INTRAVENOUS
  Administered 2012-01-23: 2 mg via INTRAVENOUS
  Filled 2012-01-18: qty 2
  Filled 2012-01-18 (×2): qty 1
  Filled 2012-01-18: qty 2
  Filled 2012-01-18 (×3): qty 1
  Filled 2012-01-18: qty 2
  Filled 2012-01-18: qty 1
  Filled 2012-01-18 (×2): qty 2
  Filled 2012-01-18 (×3): qty 1

## 2012-01-18 MED ORDER — NALOXONE HCL 0.4 MG/ML IJ SOLN: 0.4000 mg | INTRAMUSCULAR | Status: DC | PRN

## 2012-01-18 MED ORDER — ENOXAPARIN SODIUM 40 MG/0.4ML ~~LOC~~ SOLN
40.0000 mg | Freq: Two times a day (BID) | SUBCUTANEOUS | Status: DC
  Administered 2012-01-18 – 2012-01-24 (×13): 40 mg via SUBCUTANEOUS
  Filled 2012-01-18 (×15): qty 0.4

## 2012-01-18 MED ORDER — ADULT MULTIVITAMIN W/MINERALS CH
1.0000 | ORAL_TABLET | Freq: Every day | ORAL | Status: DC
  Administered 2012-01-18 – 2012-01-23 (×6): 1 via ORAL
  Filled 2012-01-18 (×7): qty 1

## 2012-01-18 MED ORDER — SODIUM CHLORIDE 0.9 % IJ SOLN: 9.0000 mL | INTRAMUSCULAR | Status: DC | PRN

## 2012-01-18 NOTE — Progress Notes (Signed)
CRITICAL VALUE ALERT  Critical value received:  Troponin 13.87  Date of notification:  01/18/12   Time of notification:  2025  Critical value read back:yes  Nurse who received alert:  Mort Sawyers RN  MD notified (1st page):  Maryln Manuel MD  Time of first page:  2108  MD notified (2nd page):Chris Elsie Saas MD  Time of second page: 2128  Responding MD:  Maryln Manuel MD Time MD responded:  [phone]

## 2012-01-18 NOTE — Progress Notes (Signed)
Name: CREG GILMER MRN: [id number] DOB: December 18, 1955    LOS: 1  Referring Provider:  [name]  Reason for Referral:  Post surgical weaning and extubation  PULMONARY / CRITICAL CARE MEDICINE  HPI:  Patient is a morbidly obese 56 year old white male with severe exertional limitations, who had difficulty walking 10 feet, just had surgery 10/4 for colon CA in the sigmoid colon. Initially the patient was supposed to have laparoscopic partial colectomy but ended up having open colectomy by Dr. Magnus Ivan. Patient admitted to the ICU for weaning and extubation.  Vital Signs: Temp:  [97.6 F (36.4 C)-98.5 F (36.9 C)] 98.1 F (36.7 C) (10/05 0800) Pulse Rate:  [68-116] 100  (10/05 0900) Resp:  [15-24] 18  (10/05 0800) BP: (114-176)/(54-82) 138/55 mmHg (10/05 0900) SpO2:  [91 %-99 %] 95 % (10/05 0900) FiO2 (%):  [50 %-80 %] 50 % (10/05 0500) Weight:  [204.572 kg (451 lb)] 204.572 kg (451 lb) (10/04 1345)  Physical Examination: General:  Morbidly obese Neuro:  Patient's initially asleep quickly awoke now following commands HEENT:  Normocephalic atraumatic ears nose mouth normal Neck:  Neck supple no tracheal deviation no thyroidmegaly Cardiovascular:  Tachycardic slight fast murmur heard intact pulses Lungs:  Intubated diminished breath sounds in lower lobes Abdomen:  Soft hypoactive bowel sounds large midline abdominal dressing intact Musculoskeletal:  Normal range of motion no tenderness no edema noted Skin:  Intact warm and dry no rash no erythema  Principal Problem:  *Cancer of sigmoid colon Active Problems:  Morbid obesity with body mass index of 60.0-69.9 in adult  Respiratory failure, post-operative  DM (diabetes mellitus)  HTN (hypertension)   ASSESSMENT AND PLAN  PULMONARY  Lab 01/17/12 1358  PHART 7.276*  PCO2ART 56.5*  PO2ART 165.0*  HCO3 25.4*  O2SAT 97.2   Ventilator Settings: 50% mask CXR:  10/1 preop chest no acute findings ETT:  10/4 patient  intubated for surgery>>>10/4  A:   1) Post op respiratory failure in setting of OHS/OSA and s/p open lap.  Extubated and doing relatively ok from need of positive pressure standpoint.  Oxygenation is poor however. P:   Titrate O2. IS and flutter valve per RT protocol. Pain control. Decrease IVF.  CARDIOVASCULAR No results found for this basename: TROPONINI:5,LATICACIDVEN:5, O2SATVEN:5,PROBNP:5 in the last 168 hours ECG:  Sinus tachycardia Lines: Peripheral lines x2  A: Sinus tachycardia resolved P:  Monitor I/O.  3L positive.  RENAL  Lab 01/18/12 0338 01/14/12 0930  NA 135 141  K 4.4 4.1  CL 100 102  CO2 27 29  BUN 8 13  CREATININE 0.59 0.69  CALCIUM 8.5 9.3  MG -- --  PHOS -- --   Intake/Output      10/04 0701 - 10/05 0700 10/05 0701 - 10/06 0700   I.V. (mL/kg) 6025 (29.5) 375 (1.8)   Blood 700    IV Piggyback 10    Total Intake(mL/kg) 6735 (32.9) 375 (1.8)   Urine (mL/kg/hr) 1650 (0.3) 200   Emesis/NG output 300    Blood 800    Total Output 2750 200   Net +3985 +175          Intake/Output Summary (Last 24 hours) at 01/18/12 1057 Last data filed at 01/18/12 0900  Gross per 24 hour  Intake   5760 ml  Output   2850 ml  Net   2910 ml   Foley:  10/4 postop  A:  No problems noted P:   3L positive, crackles, Decrease IVF.  GASTROINTESTINAL  No results found for this basename: AST:5,ALT:5,ALKPHOS:5,BILITOT:5,PROT:5,ALBUMIN:5 in the last 168 hours  A:  1) Patient postop colectomy in setting of adenocarcinoma of sigmoid colon 2) super obesity  P:   Place patient on PPI NPO Nutrition per surgery   HEMATOLOGIC  Lab 01/18/12 0338 01/17/12 1600 01/14/12 0930  HGB 8.4* 8.6* 7.5*  HCT 29.0* 28.6* 26.8*  PLT 391 347 360  INR -- -- --  APTT -- -- --   A:  Patient presenting with preop anemia.  P:  Follow H&H Transfuse if hemoglobin less than 7  INFECTIOUS  Lab 01/18/12 0338 01/17/12 1600 01/14/12 0930  WBC 17.5* 14.5* 7.6  PROCALCITON -- -- --     Cultures: None Antibiotics: Invanz 10/4>>>  A:  No acute infection  P:   Monitor WBC  ENDOCRINE  Lab 01/18/12 0804 01/18/12 0427 01/18/12 0018 01/17/12 1931 01/17/12 1609  GLUCAP 186* 183* 142* 155* 195*   A: Diabetes   P:   SSI Monitor BMP  NEUROLOGIC  A:  Patient is awake opens eyes follows commands has post-op pain.  P: Maintain pain control but minimize sedation  Continue to monitor   Would love to be able to ambulate quickly, will need to check with surgery about OOB to chair.  Alyson Reedy, M.D. [facility] Pulmonary/Critical Care Medicine. Pager: [phone]. After hours pager: [phone].

## 2012-01-18 NOTE — Progress Notes (Signed)
Brandon Schaefer [id number] 1955-07-13   Subjective:  Using BiPAP in stepdown Unit - now on FM O2 Family & RN at bedside No major events overnight Pain controlled  Objective:  Vital signs:  Filed Vitals:   01/18/12 0200 01/18/12 0300 01/18/12 0400 01/18/12 0500  BP: 157/67 166/63 174/61 156/67  Pulse: 104 104 104 104  Temp:   98 F (36.7 C)   TempSrc:   Axillary   Resp: 19 19 15 24   Height:      Weight:      SpO2: 92% 94% 95% 91%       Intake/Output   Yesterday:  10/04 0701 - 10/05 0700 In: 6610 [I.V.:5900; Blood:700; IV Piggyback:10] Out: 2000 [Urine:900; Emesis/NG output:300; Blood:800] This shift:  Total I/O In: 1375 [I.V.:1375] Out: 750 [Urine:450; Emesis/NG output:300]  Bowel function:  Flatus: n  BM: n  Physical Exam:  General: Pt awakens in no distress Eyes: PERRL, normal EOM.  Sclera clear.  No icterus Neuro: CN II-XII intact w/o focal sensory/motor deficits. Lymph: No head/neck/groin lymphadenopathy Psych:  No delerium/psychosis/paranoia HENT: Normocephalic, Mucus membranes moist.  No thrush Neck: Supple, No tracheal deviation Chest: No chest wall pain w good excursion CV:  Pulses intact.  Regular rhythm Abdomen: Soft.  Morbidly obese.  Nondistended.  Moderately tender at incisions only.  Scant serosanguinous drainage.  No incarcerated hernias. Ext:  SCDs BLE.  No mjr edema.  No cyanosis Skin: No petechiae / purpurae  Problem List:  Principal Problem:  *Cancer of sigmoid colon Active Problems:  Morbid obesity with body mass index of 60.0-69.9 in adult  Respiratory failure, post-operative  DM (diabetes mellitus)  HTN (hypertension)   Assessment  Brandon Schaefer  56 y.o. male  1 Day Post-Op  Procedure(s): LAPAROSCOPIC PARTIAL COLECTOMY PARTIAL COLECTOMY PROCTOSCOPY  Stabilizing  Plan:  -BiPAP as needed, Pulmon help appreciated -keep in SDUnit today, hopefully floor tomorrow if pulmon status improves -glc control - SSI -HTN -  start coreg.  Hold ramapril & follow -d/c NGT since low output -anti-ileus protocol.  sips PO for now & adv slowly -VTE prophylaxis- SCDs, etc -mobilize as tolerated to help recovery  Ardeth Sportsman, M.D., F.A.C.S. Gastrointestinal and Minimally Invasive Surgery [facility] [address], [address] [place], [place] [address] [phone] Main / Paging [phone] Voice Mail   01/18/2012  CARE TEAM:  PCP: Herb Grays, MD  Outpatient Care Team: Patient Care Team: Herb Grays, MD as PCP - General (Family Medicine)  Inpatient Treatment Team: Treatment Team: Attending Provider: Shelly Rubenstein, MD; Registered Nurse: Bynum Bellows, Student-NP; Registered Nurse: Doreene Eland, RN; Rounding Team: Md Pccm, MD; Registered Nurse: Carlos Levering, RN   Results:   Labs: Results for orders placed during the hospital encounter of 01/17/12 (from the past 48 hour(s))  TYPE AND SCREEN     Status: Normal (Preliminary result)   Collection Time   01/17/12  7:55 AM      Component Value Range Comment   ABO/RH(D) O POS      Antibody Screen NEG      Sample Expiration 01/20/2012      Unit Number [id number]      Blood Component Type RED CELLS,LR      Unit division 00      Status of Unit ISSUED      Transfusion Status OK TO TRANSFUSE      Crossmatch Result Compatible      Unit Number [id number]  Blood Component Type RED CELLS,LR      Unit division 00      Status of Unit ISSUED      Transfusion Status OK TO TRANSFUSE      Crossmatch Result Compatible      Unit Number [id number]      Blood Component Type RED CELLS,LR      Unit division 00      Status of Unit ALLOCATED      Transfusion Status OK TO TRANSFUSE      Crossmatch Result Compatible      Unit Number [id number]      Blood Component Type RED CELLS,LR      Unit division 00      Status of Unit ALLOCATED      Transfusion Status OK TO TRANSFUSE      Crossmatch Result  Compatible     ABO/RH     Status: Normal   Collection Time   01/17/12  7:55 AM      Component Value Range Comment   ABO/RH(D) O POS     GLUCOSE, CAPILLARY     Status: Abnormal   Collection Time   01/17/12  7:59 AM      Component Value Range Comment   Glucose-Capillary 162 (*) 70 - 99 mg/dL   BLOOD GAS, ARTERIAL     Status: Abnormal   Collection Time   01/17/12  1:58 PM      Component Value Range Comment   FIO2 0.80      Delivery systems VENTILATOR      Mode PRESSURE REGULATED VOLUME CONTROL      VT 620      Rate 14      Peep/cpap 5.0      pH, Arterial 7.276 (*) 7.350 - 7.450    pCO2 arterial 56.5 (*) 35.0 - 45.0 mmHg    pO2, Arterial 165.0 (*) 80.0 - 100.0 mmHg    Bicarbonate 25.4 (*) 20.0 - 24.0 mEq/L    TCO2 24.6  0 - 100 mmol/L    Acid-base deficit 1.2  0.0 - 2.0 mmol/L    O2 Saturation 97.2      Patient temperature 98.6      Collection site LEFT RADIAL      Drawn by [id number]      Sample type ARTERIAL DRAW      Allens test (pass/fail) PASS  PASS   CBC     Status: Abnormal   Collection Time   01/17/12  4:00 PM      Component Value Range Comment   WBC 14.5 (*) 4.0 - 10.5 K/uL    RBC 4.15 (*) 4.22 - 5.81 MIL/uL    Hemoglobin 8.6 (*) 13.0 - 17.0 g/dL    HCT 84.6 (*) 96.2 - 52.0 %    MCV 68.9 (*) 78.0 - 100.0 fL    MCH 20.7 (*) 26.0 - 34.0 pg    MCHC 30.1  30.0 - 36.0 g/dL    RDW 95.2 (*) 84.1 - 15.5 %    Platelets 347  150 - 400 K/uL   GLUCOSE, CAPILLARY     Status: Abnormal   Collection Time   01/17/12  4:09 PM      Component Value Range Comment   Glucose-Capillary 195 (*) 70 - 99 mg/dL   GLUCOSE, CAPILLARY     Status: Abnormal   Collection Time   01/17/12  7:31 PM      Component Value Range Comment   Glucose-Capillary 155 (*)  70 - 99 mg/dL   GLUCOSE, CAPILLARY     Status: Abnormal   Collection Time   01/18/12 12:18 AM      Component Value Range Comment   Glucose-Capillary 142 (*) 70 - 99 mg/dL   CBC     Status: Abnormal   Collection Time   01/18/12  3:38 AM       Component Value Range Comment   WBC 17.5 (*) 4.0 - 10.5 K/uL WHITE COUNT CONFIRMED ON SMEAR   RBC 4.13 (*) 4.22 - 5.81 MIL/uL    Hemoglobin 8.4 (*) 13.0 - 17.0 g/dL    HCT 13.0 (*) 86.5 - 52.0 %    MCV 70.2 (*) 78.0 - 100.0 fL    MCH 20.3 (*) 26.0 - 34.0 pg    MCHC 29.0 (*) 30.0 - 36.0 g/dL    RDW 78.4 (*) 69.6 - 15.5 %    Platelets 391  150 - 400 K/uL   BASIC METABOLIC PANEL     Status: Abnormal   Collection Time   01/18/12  3:38 AM      Component Value Range Comment   Sodium 135  135 - 145 mEq/L    Potassium 4.4  3.5 - 5.1 mEq/L    Chloride 100  96 - 112 mEq/L    CO2 27  19 - 32 mEq/L    Glucose, Bld 176 (*) 70 - 99 mg/dL    BUN 8  6 - 23 mg/dL    Creatinine, Ser 2.95  0.50 - 1.35 mg/dL    Calcium 8.5  8.4 - 28.4 mg/dL    GFR calc non Af Amer >90  >90 mL/min    GFR calc Af Amer >90  >90 mL/min   GLUCOSE, CAPILLARY     Status: Abnormal   Collection Time   01/18/12  4:27 AM      Component Value Range Comment   Glucose-Capillary 183 (*) 70 - 99 mg/dL    Comment 1 Documented in Chart      Comment 2 Notify RN       Imaging / Studies: Portable Chest Xray  01/17/2012  *RADIOLOGY REPORT*  Clinical Data: Endotracheal tube placement.  PORTABLE CHEST - 1 VIEW  Comparison: 01/14/2012  Findings: Endotracheal tube is approximately 3.6 cm above the carina.  Nasogastric tube extends into the abdomen.  Prominent interstitial densities could represent low lung volumes or mild edema.  Heart size is accentuated by the technique.  IMPRESSION: Support apparatuses as described.  Prominent interstitial markings as described.   Original Report Authenticated By: Richarda Overlie, M.D.     Medications / Allergies: per chart  Antibiotics: Anti-infectives     Start     Dose/Rate Route Frequency Ordered Stop   01/18/12 1000   ertapenem (INVANZ) 1 g in sodium chloride 0.9 % 50 mL IVPB        1 g 100 mL/hr over 30 Minutes Intravenous Every 24 hours 01/17/12 1347 01/19/12 0959   01/17/12 0709   ertapenem  (INVANZ) 1 g in sodium chloride 0.9 % 50 mL IVPB        1 g 100 mL/hr over 30 Minutes Intravenous 60 min pre-op 01/17/12 0709 01/17/12 0954

## 2012-01-18 NOTE — Op Note (Signed)
NAMEMarland Kitchen  Schaefer, Brandon NO.:  [id number]  MEDICAL RECORD NO.:  [id number]  LOCATION:  1236                         FACILITY:  [facility]  PHYSICIAN:  [name], M.D. DATE OF BIRTH:  1956/04/08  DATE OF PROCEDURE:  01/17/2012 DATE OF DISCHARGE:                              OPERATIVE REPORT   PREOPERATIVE DIAGNOSIS:  Sigmoid colon cancer.  POSTOPERATIVE DIAGNOSIS:  Sigmoid colon cancer.  PROCEDURE: 1. Diagnostic laparoscopy. 2. Sigmoid colectomy. 3. Proctoscopy.  SURGEON:  [name], MD  ASSISTANT:  Velora Heckler, MD  ANESTHESIA:  General endotracheal anesthesia.  ESTIMATED BLOOD LOSS:  Minimal.  INDICATIONS:  This is a extremely morbidly obese 56 year old gentleman, who was found to be anemic.  He then underwent a colonoscopy and was found to have a near obstructing lesion in the sigmoid colon.  Decision was made to proceed to the operating room for partial colectomy.  FINDINGS:  The patient was found to have morbid obesity.  He has large amount of omental fat and morbidly obese abdominal wall, precluded anything past a diagnostic laparoscopy.  I could find no gross spread of cancer in the abdomen with liver appearing normal.  PROCEDURE IN DETAIL:  The patient was brought to the operating room, identified as Brandon Schaefer.  He was placed supine on the operating table and general anesthesia was induced.  His abdomen was then prepped and draped in usual sterile fashion.  The patient was then placed in lithotomy position.  His abdomen was then prepped and draped in the usual sterile fashion.  I made a small incision above the umbilicus.  I took this down to the fascia which was opened with scalpel.  A hemostat was used to pass to the peritoneal cavity.  A 0 Vicryl pursestring suture was then placed around the fascial opening.  The Hasson port was placed.  The opening and insufflation of the abdomen was begun.  Because of the patient's extreme  morbid obesity and large pannus, I was able to distend the abdomen enough to visualize the pelvis at all.  His omentum was very thickened and fatty and filled the abdominal cavity as well.  I could visualize the liver which appeared fatty as well but free of gross metastatic disease.  At this point, decision made to forego an attempt at laparoscopic colectomy and convert to an open procedure.  The Hasson port was removed and the abdomen was deflated.  I then created a large midline incision from above to below the umbilicus.  I took this down through the fascia with electrocautery.  The peritoneum was opened entirely through the incision.  Again the patient had a very thick abdominal wall and a large amount of intra-abdominal fat including the omentum.  This made the case quite difficult.  I could not even eviscerate the small bowel secondary to his large body habitus.  I could identify the sigmoid colon and was finally able to mobilize along the white line of Toldt and identify the tumor which had been marked with ink.  I was able to transect the colon distal to this with the contour stapler.  I then was able to mobilize the proximal colon with  the ligature cautery device.  Several bleeding areas in the mesentery had to be controlled with silk sutures.  I could finally after a large amount of difficulty secondary to his body habitus elevate the colon enough and I was able to transect the colon after placing a bowel clamp proximal to the tumor.  I then took down the rest of the mesentery with the ligature.  The specimen was sent to Pathology and grossly both margins were widely negative.  The patient then underwent proctoscopy and a EEA 29 stapling device was brought up through the anus and could be manipulated toward the anterior surface of the staple line.  I placed a 2-0 Prolene pursestring suture around the opening of the proximal colon and placed anvil in the proximal colon and tied  the suture down, securing the proximal colon around the anvil.  The distal end of the stapler was then brought through the anterior wall of the sigmoid colon under direct vision.  I then brought the 2 ends of the colon together with the stapling device and they snapped in place well.  The stapling device was slowly closed bringing the proximal colon down to the remaining sigmoid colon and rectum.  The staple device was then fired, creating an end-to-end anastomosis.  Both donuts were examined and found to be well intact.  I placed a bowel clamp proximally and filled the pelvis full of saline.  Proctoscopy was again performed and the anastomosis insufflated and distended nicely without evidence of leak. The bowel clamp was then removed.  I then thoroughly irrigated the abdomen with several liters of normal saline.  Again, hemostasis appeared to be achieved.  At this point, the patient's midline fascia was closed with a running #1 looped PDS suture as well as interrupted internal #1 Novafil retention sutures.  I then irrigated the patient's large abdominal pannus and closed loosely with staples in place and placed Telfa wicks down into the wound.  Gauze and ABD pads were then applied.  The patient appeared to tolerate the procedure secondary to his morbid obesity and anemia.  Anesthesia went ahead and transfused 2 units of blood during the procedure and decided to leave the patient intubated.  He was thus taken intubated from the operating room to the intensive care unit.     [name], M.D.     DB/MEDQ  D:  01/17/2012  T:  01/18/2012  Job:  [id number]

## 2012-01-19 ENCOUNTER — Inpatient Hospital Stay (HOSPITAL_COMMUNITY): Payer: 59

## 2012-01-19 DIAGNOSIS — R51 Headache: Secondary | ICD-10-CM

## 2012-01-19 LAB — CBC
Hemoglobin: 7.5 g/dL — ABNORMAL LOW (ref 13.0–17.0)
MCHC: 28.4 g/dL — ABNORMAL LOW (ref 30.0–36.0)
RDW: 17.5 % — ABNORMAL HIGH (ref 11.5–15.5)

## 2012-01-19 LAB — BASIC METABOLIC PANEL
GFR calc Af Amer: >90 mL/min (ref >90)
GFR calc non Af Amer: >90 mL/min (ref >90)
Potassium: 4.4 mEq/L (ref 3.5–5.1)
Sodium: 140 mEq/L (ref 135–145)

## 2012-01-19 LAB — GLUCOSE, CAPILLARY
Glucose-Capillary: 136 mg/dL — ABNORMAL HIGH (ref 70–99)
Glucose-Capillary: 138 mg/dL — ABNORMAL HIGH (ref 70–99)
Glucose-Capillary: 140 mg/dL — ABNORMAL HIGH (ref 70–99)
Glucose-Capillary: 152 mg/dL — ABNORMAL HIGH (ref 70–99)

## 2012-01-19 LAB — PHOSPHORUS: Phosphorus: 3.3 mg/dL (ref 2.3–4.6)

## 2012-01-19 MED ORDER — RAMIPRIL 5 MG PO CAPS
5.0000 mg | ORAL_CAPSULE | Freq: Every day | ORAL | Status: DC
  Administered 2012-01-20 – 2012-01-24 (×5): 5 mg via ORAL
  Filled 2012-01-19 (×6): qty 1

## 2012-01-19 MED ORDER — PIOGLITAZONE HCL 15 MG PO TABS
7.5000 mg | ORAL_TABLET | Freq: Three times a day (TID) | ORAL | Status: DC
  Administered 2012-01-19 – 2012-01-23 (×14): 7.5 mg via ORAL
  Filled 2012-01-19 (×21): qty 0.5

## 2012-01-19 MED ORDER — MAGNESIUM SULFATE 40 MG/ML IJ SOLN
2.0000 g | Freq: Once | INTRAMUSCULAR | Status: AC
  Administered 2012-01-19: 2 g via INTRAVENOUS
  Filled 2012-01-19: qty 50

## 2012-01-19 MED ORDER — INFLUENZA VIRUS VACC SPLIT PF IM SUSP
0.5000 mL | INTRAMUSCULAR | Status: AC
  Administered 2012-01-20: 0.5 mL via INTRAMUSCULAR
  Filled 2012-01-19: qty 0.5

## 2012-01-19 MED ORDER — PIOGLITAZONE HCL-METFORMIN HCL 15-850 MG PO TABS
1.0000 | ORAL_TABLET | Freq: Three times a day (TID) | ORAL | Status: DC
Start: 2012-01-19 — End: 2012-01-19

## 2012-01-19 MED ORDER — METFORMIN HCL 850 MG PO TABS
425.0000 mg | ORAL_TABLET | Freq: Three times a day (TID) | ORAL | Status: DC
  Administered 2012-01-19 – 2012-01-23 (×14): 425 mg via ORAL
  Filled 2012-01-19 (×18): qty 0.5

## 2012-01-19 MED ORDER — ATORVASTATIN CALCIUM 40 MG PO TABS
40.0000 mg | ORAL_TABLET | Freq: Every day | ORAL | Status: DC
  Administered 2012-01-19 – 2012-01-23 (×4): 40 mg via ORAL
  Filled 2012-01-19 (×6): qty 1

## 2012-01-19 MED ORDER — ASPIRIN EC 81 MG PO TBEC
81.0000 mg | DELAYED_RELEASE_TABLET | Freq: Every day | ORAL | Status: DC
  Administered 2012-01-19 – 2012-01-23 (×5): 81 mg via ORAL
  Filled 2012-01-19 (×6): qty 1

## 2012-01-19 MED ORDER — ACETAMINOPHEN 500 MG PO TABS
1000.0000 mg | ORAL_TABLET | Freq: Three times a day (TID) | ORAL | Status: DC
  Administered 2012-01-19 – 2012-01-23 (×13): 1000 mg via ORAL
  Filled 2012-01-19 (×26): qty 2

## 2012-01-19 MED ORDER — MAGNESIUM SULFATE BOLUS VIA INFUSION: 2.0000 g | Freq: Once | INTRAVENOUS | Status: DC

## 2012-01-19 NOTE — Progress Notes (Signed)
Brandon Schaefer [id number] [birth year]   Subjective:  Weaned down to [facility] RN at bedside No major events overnight Pain controlled x c/o headache (usually OTC tylenol controls) Much more alert  Objective:  Vital signs:  Filed Vitals:   01/19/12 0000 01/19/12 0200 01/19/12 0400 01/19/12 0800  BP: 138/53 151/116 150/65   Pulse: 99 99 100   Temp: 99.9 F (37.7 C)  99.6 F (37.6 C) 98.2 F (36.8 C)  TempSrc: Oral  Oral Oral  Resp: 21 18 20 18   Height:      Weight: 449 lb 4.8 oz (203.8 kg)     SpO2: 92% 93% 91% 94%       Intake/Output   Yesterday:  10/05 0701 - 10/06 0700 In: 1665 [I.V.:1665] Out: 1350 [Urine:1350] This shift:  Total I/O In: 50 [I.V.:50] Out: 200 [Urine:200]  Bowel function:  Flatus: n  BM: n  Physical Exam:  General: Pt awakens in no distress.  AAox4 Eyes: PERRL, normal EOM.  Sclera clear.  No icterus Neuro: CN II-XII intact w/o focal sensory/motor deficits. Lymph: No head/neck/groin lymphadenopathy Psych:  No delerium/psychosis/paranoia.  Calm.  Slow to answer questions but not agitated HENT: Normocephalic, Mucus membranes moist.  No thrush Neck: Supple, No tracheal deviation Chest: No chest wall pain w good excursion CV:  Pulses intact.  Regular rhythm Abdomen: Soft.  Super Morbidly obese.  Nondistended.  Moderately tender at incisions only.  Scant serosanguinous drainage at incision.  No incarcerated hernias. Ext:  SCDs BLE.  No mjr edema.  No cyanosis Skin: No petechiae / purpurae  Problem List:  Principal Problem:  *Cancer of sigmoid colon Active Problems:  Morbid obesity with body mass index of 60.0-69.9 in adult  Respiratory failure, post-operative  DM (diabetes mellitus)  HTN (hypertension)   Assessment  Brandon Schaefer  56 y.o. male  2 Days Post-Op  Procedure(s): LAPAROSCOPIC PARTIAL COLECTOMY PARTIAL COLECTOMY PROCTOSCOPY  Stabilizing  Plan:  -BiPAP as needed, Pulmon help appreciated -Transfer floor  -glc  control - SSI -HTN - start coreg.  Hold ramapril & follow -anti-ileus protocol.  sips PO for now & adv slowly -VTE prophylaxis- SCDs, etc -mobilize as tolerated to help recovery.  PT eval.  GET HIM UP & MOVING!.  Pulmonary team agrees  Ardeth Sportsman, M.D., F.A.C.S. Gastrointestinal and Minimally Invasive Surgery [facility] [address], [address] [place], [place] [address] [phone] Main / Paging [phone] Voice Mail   01/19/2012  CARE TEAM:  PCP: Herb Grays, MD  Outpatient Care Team: Patient Care Team: Herb Grays, MD as PCP - General (Family Medicine)  Inpatient Treatment Team: Treatment Team: Attending Provider: Shelly Rubenstein, MD; Registered Nurse: Bynum Bellows, Student-NP; Registered Nurse: Doreene Eland, RN; Rounding Team: Md Pccm, MD; Registered Nurse: Carlos Levering, RN; Registered Nurse: Toy Care, RN; Respiratory Therapist: Renold Genta, RRT   Results:   Labs: Results for orders placed during the hospital encounter of 01/17/12 (from the past 48 hour(s))  BLOOD GAS, ARTERIAL     Status: Abnormal   Collection Time   01/17/12  1:58 PM      Component Value Range Comment   FIO2 0.80      Delivery systems VENTILATOR      Mode PRESSURE REGULATED VOLUME CONTROL      VT 620      Rate 14      Peep/cpap 5.0      pH, Arterial 7.276 (*) 7.350 -  7.450    pCO2 arterial 56.5 (*) 35.0 - 45.0 mmHg    pO2, Arterial 165.0 (*) 80.0 - 100.0 mmHg    Bicarbonate 25.4 (*) 20.0 - 24.0 mEq/L    TCO2 24.6  0 - 100 mmol/L    Acid-base deficit 1.2  0.0 - 2.0 mmol/L    O2 Saturation 97.2      Patient temperature 98.6      Collection site LEFT RADIAL      Drawn by [id number]      Sample type ARTERIAL DRAW      Allens test (pass/fail) PASS  PASS   CBC     Status: Abnormal   Collection Time   01/17/12  4:00 PM      Component Value Range Comment   WBC 14.5 (*) 4.0 - 10.5 K/uL    RBC 4.15 (*) 4.22 - 5.81 MIL/uL     Hemoglobin 8.6 (*) 13.0 - 17.0 g/dL    HCT 04.5 (*) 40.9 - 52.0 %    MCV 68.9 (*) 78.0 - 100.0 fL    MCH 20.7 (*) 26.0 - 34.0 pg    MCHC 30.1  30.0 - 36.0 g/dL    RDW 81.1 (*) 91.4 - 15.5 %    Platelets 347  150 - 400 K/uL   GLUCOSE, CAPILLARY     Status: Abnormal   Collection Time   01/17/12  4:09 PM      Component Value Range Comment   Glucose-Capillary 195 (*) 70 - 99 mg/dL   GLUCOSE, CAPILLARY     Status: Abnormal   Collection Time   01/17/12  7:31 PM      Component Value Range Comment   Glucose-Capillary 155 (*) 70 - 99 mg/dL   GLUCOSE, CAPILLARY     Status: Abnormal   Collection Time   01/18/12 12:18 AM      Component Value Range Comment   Glucose-Capillary 142 (*) 70 - 99 mg/dL   CBC     Status: Abnormal   Collection Time   01/18/12  3:38 AM      Component Value Range Comment   WBC 17.5 (*) 4.0 - 10.5 K/uL WHITE COUNT CONFIRMED ON SMEAR   RBC 4.13 (*) 4.22 - 5.81 MIL/uL    Hemoglobin 8.4 (*) 13.0 - 17.0 g/dL    HCT 78.2 (*) 95.6 - 52.0 %    MCV 70.2 (*) 78.0 - 100.0 fL    MCH 20.3 (*) 26.0 - 34.0 pg    MCHC 29.0 (*) 30.0 - 36.0 g/dL    RDW 21.3 (*) 08.6 - 15.5 %    Platelets 391  150 - 400 K/uL   BASIC METABOLIC PANEL     Status: Abnormal   Collection Time   01/18/12  3:38 AM      Component Value Range Comment   Sodium 135  135 - 145 mEq/L    Potassium 4.4  3.5 - 5.1 mEq/L    Chloride 100  96 - 112 mEq/L    CO2 27  19 - 32 mEq/L    Glucose, Bld 176 (*) 70 - 99 mg/dL    BUN 8  6 - 23 mg/dL    Creatinine, Ser 5.78  0.50 - 1.35 mg/dL    Calcium 8.5  8.4 - 46.9 mg/dL    GFR calc non Af Amer >90  >90 mL/min    GFR calc Af Amer >90  >90 mL/min   GLUCOSE, CAPILLARY     Status:  Abnormal   Collection Time   01/18/12  4:27 AM      Component Value Range Comment   Glucose-Capillary 183 (*) 70 - 99 mg/dL    Comment 1 Documented in Chart      Comment 2 Notify RN     GLUCOSE, CAPILLARY     Status: Abnormal   Collection Time   01/18/12  8:04 AM      Component Value Range  Comment   Glucose-Capillary 186 (*) 70 - 99 mg/dL    Comment 1 Documented in Chart      Comment 2 Notify RN     GLUCOSE, CAPILLARY     Status: Abnormal   Collection Time   01/18/12 11:59 AM      Component Value Range Comment   Glucose-Capillary 159 (*) 70 - 99 mg/dL    Comment 1 Documented in Chart      Comment 2 Notify RN     GLUCOSE, CAPILLARY     Status: Abnormal   Collection Time   01/18/12  4:06 PM      Component Value Range Comment   Glucose-Capillary 131 (*) 70 - 99 mg/dL    Comment 1 Documented in Chart      Comment 2 Notify RN     GLUCOSE, CAPILLARY     Status: Abnormal   Collection Time   01/18/12  8:19 PM      Component Value Range Comment   Glucose-Capillary 140 (*) 70 - 99 mg/dL   GLUCOSE, CAPILLARY     Status: Abnormal   Collection Time   01/18/12 11:55 PM      Component Value Range Comment   Glucose-Capillary 136 (*) 70 - 99 mg/dL   GLUCOSE, CAPILLARY     Status: Abnormal   Collection Time   01/19/12  3:51 AM      Component Value Range Comment   Glucose-Capillary 138 (*) 70 - 99 mg/dL   CBC     Status: Abnormal   Collection Time   01/19/12  3:58 AM      Component Value Range Comment   WBC 14.7 (*) 4.0 - 10.5 K/uL    RBC 3.66 (*) 4.22 - 5.81 MIL/uL    Hemoglobin 7.5 (*) 13.0 - 17.0 g/dL    HCT 98.1 (*) 19.1 - 52.0 %    MCV 72.1 (*) 78.0 - 100.0 fL    MCH 20.5 (*) 26.0 - 34.0 pg    MCHC 28.4 (*) 30.0 - 36.0 g/dL    RDW 47.8 (*) 29.5 - 15.5 %    Platelets 307  150 - 400 K/uL   BASIC METABOLIC PANEL     Status: Abnormal   Collection Time   01/19/12  3:58 AM      Component Value Range Comment   Sodium 140  135 - 145 mEq/L    Potassium 4.4  3.5 - 5.1 mEq/L    Chloride 101  96 - 112 mEq/L    CO2 31  19 - 32 mEq/L    Glucose, Bld 154 (*) 70 - 99 mg/dL    BUN 10  6 - 23 mg/dL    Creatinine, Ser 6.21  0.50 - 1.35 mg/dL    Calcium 8.9  8.4 - 30.8 mg/dL    GFR calc non Af Amer >90  >90 mL/min    GFR calc Af Amer >90  >90 mL/min   MAGNESIUM     Status: Normal    Collection Time   01/19/12  3:58 AM      Component Value Range Comment   Magnesium 1.8  1.5 - 2.5 mg/dL   PHOSPHORUS     Status: Normal   Collection Time   01/19/12  3:58 AM      Component Value Range Comment   Phosphorus 3.3  2.3 - 4.6 mg/dL   GLUCOSE, CAPILLARY     Status: Abnormal   Collection Time   01/19/12  7:52 AM      Component Value Range Comment   Glucose-Capillary 140 (*) 70 - 99 mg/dL    Comment 1 Documented in Chart      Comment 2 Notify RN       Imaging / Studies: Portable Chest Xray In Am  01/18/2012  *RADIOLOGY REPORT*  Clinical Data: Respiratory failure  CHEST - 1 VIEW  Comparison:  01/17/2012  Findings: Endotracheal tube has been removed.  Nasogastric tube remains.  Lungs show persistent low volumes.  No overt focal consolidation or pulmonary edema is identified.  Heart remains enlarged.  No significant pleural effusions.  IMPRESSION: Low lung volumes.   Original Report Authenticated By: Reola Calkins, M.D.    Portable Chest Xray  01/17/2012  *RADIOLOGY REPORT*  Clinical Data: Endotracheal tube placement.  PORTABLE CHEST - 1 VIEW  Comparison: 01/14/2012  Findings: Endotracheal tube is approximately 3.6 cm above the carina.  Nasogastric tube extends into the abdomen.  Prominent interstitial densities could represent low lung volumes or mild edema.  Heart size is accentuated by the technique.  IMPRESSION: Support apparatuses as described.  Prominent interstitial markings as described.   Original Report Authenticated By: Richarda Overlie, M.D.     Medications / Allergies: per chart  Antibiotics: Anti-infectives     Start     Dose/Rate Route Frequency Ordered Stop   01/18/12 1000   ertapenem (INVANZ) 1 g in sodium chloride 0.9 % 50 mL IVPB        1 g 100 mL/hr over 30 Minutes Intravenous Every 24 hours 01/17/12 1347 01/18/12 1130   01/17/12 0709   ertapenem (INVANZ) 1 g in sodium chloride 0.9 % 50 mL IVPB        1 g 100 mL/hr over 30 Minutes Intravenous 60 min pre-op  01/17/12 0709 01/17/12 0954

## 2012-01-19 NOTE — Progress Notes (Signed)
Name: Brandon Schaefer MRN: [id number] DOB: March 25, 1956    LOS: 2  Referring Provider:  [name]  Reason for Referral:  Post surgical weaning and extubation  PULMONARY / CRITICAL CARE MEDICINE  HPI:  Patient is a morbidly obese 56 year old white male with severe exertional limitations, who had difficulty walking 10 feet, just had surgery 10/4 for colon CA in the sigmoid colon. Initially the patient was supposed to have laparoscopic partial colectomy but ended up having open colectomy by Dr. Magnus Ivan. Patient admitted to the ICU for weaning and extubation.  Vital Signs: Temp:  [98.1 F (36.7 C)-99.9 F (37.7 C)] 98.2 F (36.8 C) (10/06 0800) Pulse Rate:  [76-101] 76  (10/06 1100) Resp:  [15-22] 17  (10/06 1100) BP: (127-151)/(52-116) 134/75 mmHg (10/06 1100) SpO2:  [90 %-94 %] 92 % (10/06 1100) Weight:  [203.8 kg (449 lb 4.8 oz)] 203.8 kg (449 lb 4.8 oz) (10/06 0000)  Physical Examination: General:  Morbidly obese Neuro:  Patient's initially asleep quickly awoke now following commands HEENT:  Normocephalic atraumatic ears nose mouth normal Neck:  Neck supple no tracheal deviation no thyroidmegaly Cardiovascular:  Tachycardic slight fast murmur heard intact pulses Lungs:  Intubated diminished breath sounds in lower lobes Abdomen:  Soft hypoactive bowel sounds large midline abdominal dressing intact Musculoskeletal:  Normal range of motion no tenderness no edema noted Skin:  Intact warm and dry no rash no erythema  Principal Problem:  *Cancer of sigmoid colon Active Problems:  Morbid obesity with body mass index of 60.0-69.9 in adult  Respiratory failure, post-operative  DM (diabetes mellitus)  HTN (hypertension)   ASSESSMENT AND PLAN  PULMONARY  Lab 01/17/12 1358  PHART 7.276*  PCO2ART 56.5*  PO2ART 165.0*  HCO3 25.4*  O2SAT 97.2   Ventilator Settings: 50% mask CXR:  10/1 preop chest no acute findings ETT:  10/4 patient intubated for surgery>>>10/4  A:    1) Post op respiratory failure in setting of OHS/OSA and s/p open lap.  Extubated and doing relatively ok from need of positive pressure standpoint.  Oxygenation is poor however. P:   Titrate O2. IS and flutter valve per RT protocol. Pain control. Decrease IVF.  CARDIOVASCULAR No results found for this basename: TROPONINI:5,LATICACIDVEN:5, O2SATVEN:5,PROBNP:5 in the last 168 hours ECG:  Sinus tachycardia Lines: Peripheral lines x2  A: Sinus tachycardia resolved P:  Monitor I/O.  RENAL  Lab 01/19/12 0358 01/18/12 0338 01/14/12 0930  NA 140 135 141  K 4.4 4.4 --  CL 101 100 102  CO2 31 27 29   BUN 10 8 13   CREATININE 0.60 0.59 0.69  CALCIUM 8.9 8.5 9.3  MG 1.8 -- --  PHOS 3.3 -- --   Intake/Output      10/05 0701 - 10/06 0700 10/06 0701 - 10/07 0700   I.V. (mL/kg) 1665 (8.2) 150 (0.7)   Blood     IV Piggyback     Total Intake(mL/kg) 1665 (8.2) 150 (0.7)   Urine (mL/kg/hr) 1350 (0.3) 200 (0.2)   Emesis/NG output     Blood     Total Output 1350 200   Net +315 -50          Intake/Output Summary (Last 24 hours) at 01/19/12 1130 Last data filed at 01/19/12 1100  Gross per 24 hour  Intake   1440 ml  Output   1350 ml  Net     90 ml   Foley:  10/4 postop  A:  No problems noted P:   3L positive,  crackles, Decrease IVF. Keep fluid even as much as possible.  GASTROINTESTINAL No results found for this basename: AST:5,ALT:5,ALKPHOS:5,BILITOT:5,PROT:5,ALBUMIN:5 in the last 168 hours  A:  1) Patient postop colectomy in setting of adenocarcinoma of sigmoid colon 2) super obesity  P:   Place patient on PPI Nutrition per surgery   HEMATOLOGIC  Lab 01/19/12 0358 01/18/12 0338 01/17/12 1600 01/14/12 0930  HGB 7.5* 8.4* 8.6* 7.5*  HCT 26.4* 29.0* 28.6* 26.8*  PLT 307 391 347 360  INR -- -- -- --  APTT -- -- -- --   A:  Patient presenting with preop anemia.  P:  Follow H&H Transfuse if hemoglobin less than 7  INFECTIOUS  Lab 01/19/12 0358 01/18/12 0338  01/17/12 1600 01/14/12 0930  WBC 14.7* 17.5* 14.5* 7.6  PROCALCITON -- -- -- --   Cultures: None Antibiotics: Invanz 10/4>>>  A:  No acute infection  P:   Monitor WBC  ENDOCRINE  Lab 01/19/12 0752 01/19/12 0351 01/18/12 2355 01/18/12 2019 01/18/12 1606  GLUCAP 140* 138* 136* 140* 131*   A: Diabetes   P:   SSI Monitor BMP  NEUROLOGIC  A:  Patient is awake opens eyes follows commands has post-op pain.  P: Maintain pain control but minimize sedation  Continue to monitor   PCCM signing off, please call back if needed.  Alyson Reedy, M.D. [facility] Pulmonary/Critical Care Medicine. Pager: [phone]. After hours pager: [phone].

## 2012-01-20 ENCOUNTER — Encounter (HOSPITAL_COMMUNITY): Payer: Self-pay | Admitting: Surgery

## 2012-01-20 LAB — BASIC METABOLIC PANEL
BUN: 15 mg/dL (ref 6–23)
Creatinine, Ser: 0.92 mg/dL (ref 0.50–1.35)
GFR calc Af Amer: >90 mL/min (ref >90)
GFR calc non Af Amer: >90 mL/min (ref >90)

## 2012-01-20 LAB — GLUCOSE, CAPILLARY
Glucose-Capillary: 139 mg/dL — ABNORMAL HIGH (ref 70–99)
Glucose-Capillary: 140 mg/dL — ABNORMAL HIGH (ref 70–99)
Glucose-Capillary: 149 mg/dL — ABNORMAL HIGH (ref 70–99)
Glucose-Capillary: 152 mg/dL — ABNORMAL HIGH (ref 70–99)
Glucose-Capillary: 154 mg/dL — ABNORMAL HIGH (ref 70–99)
Glucose-Capillary: 186 mg/dL — ABNORMAL HIGH (ref 70–99)

## 2012-01-20 LAB — CBC
HCT: 25.2 % — ABNORMAL LOW (ref 39.0–52.0)
MCHC: 28.6 g/dL — ABNORMAL LOW (ref 30.0–36.0)
MCV: 71.6 fL — ABNORMAL LOW (ref 78.0–100.0)
Platelets: 314 10*3/uL (ref 150–400)
RDW: 17.6 % — ABNORMAL HIGH (ref 11.5–15.5)

## 2012-01-20 LAB — MAGNESIUM: Magnesium: 1.9 mg/dL (ref 1.5–2.5)

## 2012-01-20 LAB — PREPARE RBC (CROSSMATCH)

## 2012-01-20 MED ORDER — OXYCODONE-ACETAMINOPHEN 5-325 MG PO TABS
1.0000 | ORAL_TABLET | ORAL | Status: DC | PRN
  Administered 2012-01-20 – 2012-01-24 (×7): 2 via ORAL
  Filled 2012-01-20 (×7): qty 2

## 2012-01-20 NOTE — Progress Notes (Signed)
Pharmacy Brief Note - Alvimopan (Entereg)  The standing order set for alvimopan (Entereg) now includes an automatic order to discontinue the drug after the patient has had a bowel movement.  The change was approved by the Pharmacy & Therapeutics Committee and the Medical Executive Committee.    This patient has had a bowel movement documented by nursing.  Therefore, alvimopan has been discontinued.  If there are questions, please contact the pharmacy at [phone].  Thank you-  Elie Goody, PharmD, BCPS Pager: [phone] 01/20/2012  6:52 AM

## 2012-01-20 NOTE — Care Management Note (Signed)
    Page 1 of 2   01/22/2012     4:58:23 PM   CARE MANAGEMENT NOTE 01/22/2012  Patient:  Brandon Schaefer, Brandon Schaefer   Account Number:  [id number]  Date Initiated:  01/20/2012  Documentation initiated by:  Lorenda Ishihara  Subjective/Objective Assessment:   56 yo male admitted s/p open colectomy. PTA lived at home with parent.     Action/Plan:   Anticipated DC Date:  01/24/2012   Anticipated DC Plan:  HOME W HOME HEALTH SERVICES      DC Planning Services  CM consult      Choice offered to / List presented to:  C-1 Patient   DME arranged  Levan Hurst      DME agency  [facility]     HH arranged  HH-2 PT  HH-4 NURSE'S AIDE      HH agency  [facility]   Status of service:  Completed, signed off Medicare Important Message given?   (If response is "NO", the following Medicare IM given date fields will be blank) Date Medicare IM given:   Date Additional Medicare IM given:    Discharge Disposition:  HOME W HOME HEALTH SERVICES  Per UR Regulation:  Reviewed for med. necessity/level of care/duration of stay  If discussed at Long Length of Stay Meetings, dates discussed:    Comments:  01-22-12 Lorenda Ishihara RN CM 1600 Spoke with patient regarding HH needs, wants to use [facility] for [facility] services. Will likely need [facility] RN for wound care and PT, aide requested. Contacted [facility] to arrange. Awaiting final orders.  01-20-12 Lorenda Ishihara RN CM [phone] Spoke with patient at bedside. Patient states he currently is primary caregiver for his mother. She is physically able to care for self but he assist with finances, transportation, meals etc. He is unsure what he might need at this time. Has some family support but was independent prior to admission. Provided him with a list of HH agencies for choice if he needs skilled services. Will continue to follow for other d/c needs.

## 2012-01-20 NOTE — Progress Notes (Signed)
3 Days Post-Op  Subjective: POD#3 +flatus and BM per patient report Comfortable No SOB  Objective: Vital signs in last 24 hours: Temp:  [98 F (36.7 C)-98.6 F (37 C)] 98.4 F (36.9 C) (10/07 0548) Pulse Rate:  [76-96] 84  (10/07 0548) Resp:  [12-20] 12  (10/07 0741) BP: (118-155)/(55-78) 155/67 mmHg (10/07 0548) SpO2:  [92 %-98 %] 95 % (10/07 0741) Last BM Date: 01/19/12  Intake/Output from previous day: 10/06 0701 - 10/07 0700 In: 1820 [P.O.:720; I.V.:1100] Out: 200 [Urine:200] Intake/Output this shift:    Lungs clear Abdomen soft, incision stable  Lab Results:   Basename 01/20/12 0355 01/19/12 0358  WBC 13.1* 14.7*  HGB 7.2* 7.5*  HCT 25.2* 26.4*  PLT 314 307   BMET  Basename 01/20/12 0355 01/19/12 0358  NA 138 140  K 4.2 4.4  CL 99 101  CO2 34* 31  GLUCOSE 171* 154*  BUN 15 10  CREATININE 0.92 0.60  CALCIUM 9.1 8.9   PT/INR No results found for this basename: LABPROT:2,INR:2 in the last 72 hours ABG  Basename 01/17/12 1358  PHART 7.276*  HCO3 25.4*    Studies/Results: Dg Chest Port 1 View  01/19/2012  *RADIOLOGY REPORT*  Clinical Data: Hypoxemia.  Diabetes.  Shortness of breath.  PORTABLE CHEST - 1 VIEW  Comparison: 01/18/2012.  Findings: Nasogastric tube has been removed.  Heart is enlarged. There is mild interstitial edema.  No overt alveolar edema identified.  No focal consolidations or pleural effusions.  There is elevation of the right hemidiaphragm.  IMPRESSION: Cardiomegaly and interstitial edema.  The   Original Report Authenticated By: Patterson Hammersmith, M.D.     Anti-infectives: Anti-infectives     Start     Dose/Rate Route Frequency Ordered Stop   01/18/12 1000   ertapenem (INVANZ) 1 g in sodium chloride 0.9 % 50 mL IVPB        1 g 100 mL/hr over 30 Minutes Intravenous Every 24 hours 01/17/12 1347 01/18/12 1130   01/17/12 0709   ertapenem (INVANZ) 1 g in sodium chloride 0.9 % 50 mL IVPB        1 g 100 mL/hr over 30 Minutes  Intravenous 60 min pre-op 01/17/12 0709 01/17/12 0954          Assessment/Plan: s/p Procedure(s) (LRB) with comments: LAPAROSCOPIC PARTIAL COLECTOMY (N/A) - attempted PARTIAL COLECTOMY () PROCTOSCOPY ()  Ileus resolving.  Will go slow on advancing diet Chronic anemia.  Will give two more units PRBC ambulate  LOS: 3 days    Makenzy Krist A 01/20/2012

## 2012-01-20 NOTE — Evaluation (Signed)
Physical Therapy Evaluation Patient Details Name: Brandon Schaefer MRN: [id number] DOB: 10-13-1955 Today's Date: 01/20/2012 Time: 4098-1191 PT Time Calculation (min): 27 min  PT Assessment / Plan / Recommendation Clinical Impression  Pt s/p lap partial colectomy.  Pt would benefit from acute PT services in order to safely mobilize morbidly obese pt to increase independence with transfers and ambulation for safe d/c home.  Pt reports he will have mother and friends to assist him at home, if 24/7 assist not available then recommend ST-SNF.    PT Assessment  Patient needs continued PT services    Follow Up Recommendations  Home health PT;Supervision/Assistance - 24 hour    Does the patient have the potential to tolerate intense rehabilitation      Barriers to Discharge        Equipment Recommendations  Rolling walker with 5" wheels (bariatric)    Recommendations for Other Services     Frequency Min 3X/week    Precautions / Restrictions Precautions Precautions: Fall Precaution Comments: abdominal incision   Pertinent Vitals/Pain 1-2/10 abdominal pain at rest, 7-8/10 with mobility      Mobility  Bed Mobility Bed Mobility: Supine to Sit;Sit to Supine Supine to Sit: 3: Mod assist;HOB elevated Sit to Supine: 3: Mod assist Details for Bed Mobility Assistance: assist for R leg over EOB and trunk, assist for bilateral LEs onto bed Transfers Transfers: Sit to Stand;Stand to Sit Sit to Stand: With upper extremity assist;4: Min guard;From bed Stand to Sit: With upper extremity assist;4: Min guard;To bed Details for Transfer Assistance: verbal cues for safe technique Ambulation/Gait Ambulation/Gait Assistance: 4: Min guard Ambulation Distance (Feet): 15 Feet Assistive device: Rolling walker Ambulation/Gait Assistance Details: pt reports RW assists with pain control, ambulated around room, verbal cues for turns with RW Gait Pattern: Step-through pattern;Decreased stride  length;Trunk flexed;Wide base of support Gait velocity: decreased General Gait Details: pt reports 2/4 dyspnea on exertion however reports it has been worse before surgery, ambulated without oxygen and SaO2 dropped to 86%, 3L 02  reapplied upon sitting on bed.  Increased time with all mobility   Shoulder Instructions     Exercises     PT Diagnosis: Difficulty walking;Acute pain  PT Problem List: Decreased activity tolerance;Decreased mobility;Cardiopulmonary status limiting activity;Decreased knowledge of use of DME;Pain;Obesity PT Treatment Interventions: Gait training;DME instruction;Stair training;Patient/family education;Functional mobility training;Therapeutic activities;Therapeutic exercise   PT Goals Acute Rehab PT Goals PT Goal Formulation: With patient Time For Goal Achievement: 01/27/12 Potential to Achieve Goals: Good Pt will go Supine/Side to Sit: with modified independence PT Goal: Supine/Side to Sit - Progress: Goal set today Pt will go Sit to Supine/Side: with modified independence PT Goal: Sit to Supine/Side - Progress: Goal set today Pt will go Sit to Stand: with supervision PT Goal: Sit to Stand - Progress: Goal set today Pt will go Stand to Sit: with supervision PT Goal: Stand to Sit - Progress: Goal set today Pt will Ambulate: 51 - 150 feet;with supervision;with least restrictive assistive device PT Goal: Ambulate - Progress: Goal set today Pt will Perform Home Exercise Program: with supervision, verbal cues required/provided PT Goal: Perform Home Exercise Program - Progress: Goal set today  Visit Information  Last PT Received On: 01/20/12 Assistance Needed: +1    Subjective Data  Subjective: "I've been getting up to the [facility]."   Prior Functioning  Home Living Lives With: Other (Comment) (reports mother will be around to help) Available Help at Discharge: Friend(s) Type of Home: House Home Access: Stairs  to enter Entrance Stairs-Number of Steps:  2 Entrance Stairs-Rails: None Home Layout: One level Home Adaptive Equipment: None Prior Function Level of Independence: Independent Communication Communication: No difficulties    Cognition  Overall Cognitive Status: Appears within functional limits for tasks assessed/performed Arousal/Alertness: Awake/alert Orientation Level: Appears intact for tasks assessed Behavior During Session: [facility] for tasks performed    Extremity/Trunk Assessment Right Upper Extremity Assessment RUE ROM/Strength/Tone: [facility] for tasks assessed Left Upper Extremity Assessment LUE ROM/Strength/Tone: WFL for tasks assessed Right Lower Extremity Assessment RLE ROM/Strength/Tone: Deficits RLE ROM/Strength/Tone Deficits: assist required for movement against gravity during bed mobility Left Lower Extremity Assessment LLE ROM/Strength/Tone: Deficits LLE ROM/Strength/Tone Deficits: assist required for movement against gravity during bed mobility   Balance    End of Session PT - End of Session Equipment Utilized During Treatment: Oxygen Activity Tolerance: Patient limited by fatigue Patient left: in bed;with call bell/phone within reach Nurse Communication: Mobility status  GP     Stephannie Broner,KATHrine E 01/20/2012, 3:29 PM Pager: [phone]

## 2012-01-21 LAB — TYPE AND SCREEN
ABO/RH(D): O POS
Antibody Screen: NEGATIVE
Unit division: 0
Unit division: 0

## 2012-01-21 LAB — GLUCOSE, CAPILLARY
Glucose-Capillary: 126 mg/dL — ABNORMAL HIGH (ref 70–99)
Glucose-Capillary: 129 mg/dL — ABNORMAL HIGH (ref 70–99)
Glucose-Capillary: 136 mg/dL — ABNORMAL HIGH (ref 70–99)
Glucose-Capillary: 140 mg/dL — ABNORMAL HIGH (ref 70–99)
Glucose-Capillary: 147 mg/dL — ABNORMAL HIGH (ref 70–99)

## 2012-01-21 NOTE — Progress Notes (Signed)
4 Days Post-Op  Subjective: POD#4 Still fatigues very easily Tolerating liquids Complains of arm swelling Still having BM's  Objective: Vital signs in last 24 hours: Temp:  [97.6 F (36.4 C)-98.9 F (37.2 C)] 98.2 F (36.8 C) (10/08 0532) Pulse Rate:  [71-93] 84  (10/08 0532) Resp:  [18-22] 20  (10/08 0532) BP: (129-170)/(56-76) 170/67 mmHg (10/08 0532) SpO2:  [96 %] 96 % (10/08 0532) Last BM Date: 01/20/12  Intake/Output from previous day: 10/07 0701 - 10/08 0700 In: 2832.5 [P.O.:800; I.V.:1286.7; Blood:745.8] Out: -  Intake/Output this shift:    Abdomen obese, but soft Wounds clean, wicks backed out some  Lab Results:   Basename 01/20/12 0355 01/19/12 0358  WBC 13.1* 14.7*  HGB 7.2* 7.5*  HCT 25.2* 26.4*  PLT 314 307   BMET  Basename 01/20/12 0355 01/19/12 0358  NA 138 140  K 4.2 4.4  CL 99 101  CO2 34* 31  GLUCOSE 171* 154*  BUN 15 10  CREATININE 0.92 0.60  CALCIUM 9.1 8.9   PT/INR No results found for this basename: LABPROT:2,INR:2 in the last 72 hours ABG No results found for this basename: PHART:2,PCO2:2,PO2:2,HCO3:2 in the last 72 hours  Studies/Results: No results found.  Anti-infectives: Anti-infectives     Start     Dose/Rate Route Frequency Ordered Stop   01/18/12 1000   ertapenem (INVANZ) 1 g in sodium chloride 0.9 % 50 mL IVPB        1 g 100 mL/hr over 30 Minutes Intravenous Every 24 hours 01/17/12 1347 01/18/12 1130   01/17/12 0709   ertapenem (INVANZ) 1 g in sodium chloride 0.9 % 50 mL IVPB        1 g 100 mL/hr over 30 Minutes Intravenous 60 min pre-op 01/17/12 0709 01/17/12 0954          Assessment/Plan: s/p Procedure(s) (LRB) with comments: LAPAROSCOPIC PARTIAL COLECTOMY (N/A) - attempted PARTIAL COLECTOMY () PROCTOSCOPY ()  Continue PT Activity limited by super obesity Regular diet  LOS: 4 days    Razi Hickle A 01/21/2012

## 2012-01-21 NOTE — Progress Notes (Signed)
Physical Therapy Treatment Patient Details Name: Brandon Schaefer MRN: [id number] DOB: 1956-02-23 Today's Date: 01/21/2012 Time: 4098-1191 PT Time Calculation (min): 24 min  PT Assessment / Plan / Recommendation Comments on Treatment Session  Progressing slowly with mobility/activity tolerance. Continues to have dyspnea with activity. Kept pt on O2 throughout session.     Follow Up Recommendations  Home health PT;Supervision/Assistance - 24 hour     Does the patient have the potential to tolerate intense rehabilitation     Barriers to Discharge        Equipment Recommendations  Rolling walker with 5" wheels (bariatric)    Recommendations for Other Services    Frequency Min 3X/week   Plan Discharge plan remains appropriate    Precautions / Restrictions Precautions Precautions: Fall Restrictions Weight Bearing Restrictions: No   Pertinent Vitals/Pain Abdomen 7-8/10. Rn made aware    Mobility  Bed Mobility Bed Mobility: Supine to Sit;Sit to Supine Supine to Sit: 3: Mod assist Sit to Supine: 3: Mod assist Details for Bed Mobility Assistance: Assist for trunk to upright and bil LEs onto bed. Increased time. Pt used trapeze as well.  Transfers Transfers: Sit to Stand;Stand to Sit Sit to Stand: 4: Min guard;From bed Stand to Sit: 4: Min guard;To bed Ambulation/Gait Ambulation/Gait Assistance: 4: Min guard Ambulation Distance (Feet): 60 Feet Assistive device: Rolling walker Ambulation/Gait Assistance Details: Slow gait speed. Dyspnea 3/4. 1 standing rest break. VCs pursed lip breathing.  Ambulated on 3L O2.  Gait Pattern: Step-through pattern;Decreased stride length;Decreased step length - right;Decreased step length - left    Exercises     PT Diagnosis:    PT Problem List:   PT Treatment Interventions:     PT Goals Acute Rehab PT Goals Pt will go Supine/Side to Sit: with modified independence PT Goal: Supine/Side to Sit - Progress: Progressing toward goal Pt will  go Sit to Supine/Side: with modified independence PT Goal: Sit to Supine/Side - Progress: Progressing toward goal Pt will go Sit to Stand: with supervision PT Goal: Sit to Stand - Progress: Progressing toward goal Pt will go Stand to Sit: with supervision PT Goal: Stand to Sit - Progress: Progressing toward goal Pt will Ambulate: 51 - 150 feet;with supervision;with least restrictive assistive device PT Goal: Ambulate - Progress: Progressing toward goal  Visit Information  Last PT Received On: 01/21/12 Assistance Needed: +1    Subjective Data  Subjective: "I've been up a few times to the bathroom" Patient Stated Goal: None stated   Cognition  Overall Cognitive Status: Appears within functional limits for tasks assessed/performed Arousal/Alertness: Awake/alert Orientation Level: Appears intact for tasks assessed Behavior During Session: [facility] for tasks performed    Balance     End of Session PT - End of Session Equipment Utilized During Treatment: Oxygen Activity Tolerance: Patient limited by fatigue Patient left: in bed;with call bell/phone within reach   GP     Rebeca Alert [facility] 01/21/2012, 4:22 PM [phone]

## 2012-01-22 LAB — GLUCOSE, CAPILLARY
Glucose-Capillary: 139 mg/dL — ABNORMAL HIGH (ref 70–99)
Glucose-Capillary: 147 mg/dL — ABNORMAL HIGH (ref 70–99)

## 2012-01-22 LAB — CBC
Hemoglobin: 8.3 g/dL — ABNORMAL LOW (ref 13.0–17.0)
MCHC: 29.4 g/dL — ABNORMAL LOW (ref 30.0–36.0)
Platelets: 296 10*3/uL (ref 150–400)

## 2012-01-22 MED ORDER — INSULIN ASPART 100 UNIT/ML ~~LOC~~ SOLN
2.0000 [IU] | Freq: Three times a day (TID) | SUBCUTANEOUS | Status: DC
  Administered 2012-01-22: 4 [IU] via SUBCUTANEOUS
  Administered 2012-01-23 (×2): 2 [IU] via SUBCUTANEOUS
  Administered 2012-01-23: 4 [IU] via SUBCUTANEOUS
  Administered 2012-01-24: 2 [IU] via SUBCUTANEOUS

## 2012-01-22 NOTE — Progress Notes (Signed)
Physical Therapy Treatment Patient Details Name: Brandon Schaefer MRN: [id number] DOB: [birth year] Today's Date: 01/22/2012 Time: 1415-1450 PT Time Calculation (min): 35 min  PT Assessment / Plan / Recommendation Comments on Treatment Session  Continuing to progress slowly. Able to ambulate on RA this session. Still dyspneic-2/4. Discussed possibility of pt having to sleep in recliner initially since pt is still requiring Mod A for bed mobility. Recommend HHPT     Follow Up Recommendations  Home health PT;Supervision/Assistance - 24 hour     Does the patient have the potential to tolerate intense rehabilitation     Barriers to Discharge        Equipment Recommendations  Rolling walker with 5" wheels (bariatric)    Recommendations for Other Services    Frequency Min 3X/week   Plan Discharge plan remains appropriate    Precautions / Restrictions Precautions Precautions: Fall Precaution Comments: abdominal incision Restrictions Weight Bearing Restrictions: No   Pertinent Vitals/Pain 5/10 abdomen    Mobility  Bed Mobility Bed Mobility: Supine to Sit;Sit to Supine Supine to Sit: 3: Mod assist Sit to Supine: 3: Mod assist Details for Bed Mobility Assistance: Assist for trunk to upright and bil LEs onto bed. Increased time. Pt used trapeze as well. Discussed bed mobility at home. Pt states he has a recliner that he can sleep in. Recliner may be best option.  Transfers Transfers: Sit to Stand;Stand to Sit Sit to Stand: From bed;5: Supervision Stand to Sit: To bed;5: Supervision Ambulation/Gait Ambulation/Gait Assistance: 5: Supervision Ambulation Distance (Feet): 75 Feet Assistive device: Rolling walker Ambulation/Gait Assistance Details: Slow gait speed. Dsypnea 2/4. 2 short standing rest breaks. Amublated on RA. O2 sat monitor malfunctioning during walk. Sats 96% on RA after walk.  Gait Pattern: Step-through pattern;Decreased stride length    Exercises     PT Diagnosis:     PT Problem List:   PT Treatment Interventions:     PT Goals Acute Rehab PT Goals Pt will go Supine/Side to Sit: with modified independence PT Goal: Supine/Side to Sit - Progress: Progressing toward goal Pt will go Sit to Supine/Side: with modified independence PT Goal: Sit to Supine/Side - Progress: Progressing toward goal Pt will go Sit to Stand: with supervision PT Goal: Sit to Stand - Progress: Progressing toward goal Pt will go Stand to Sit: with supervision PT Goal: Stand to Sit - Progress: Progressing toward goal Pt will Ambulate: 51 - 150 feet;with supervision;with least restrictive assistive device PT Goal: Ambulate - Progress: Progressing toward goal  Visit Information  Last PT Received On: 01/22/12 Assistance Needed: +1    Subjective Data  Subjective: "I was waiting on pain meds, but we can take a walk" Patient Stated Goal: Home   Cognition  Overall Cognitive Status: Appears within functional limits for tasks assessed/performed Arousal/Alertness: Awake/alert Orientation Level: Appears intact for tasks assessed Behavior During Session: [facility] for tasks performed    Balance     End of Session PT - End of Session Activity Tolerance: Patient limited by pain;Patient limited by fatigue Patient left: in bed;with call bell/phone within reach   GP     Rebeca Alert [facility] 01/22/2012, 2:58 PM [phone]

## 2012-01-22 NOTE — Progress Notes (Signed)
5 Days Post-Op  Subjective: Tolerating po Having BM's Still needing IV pain meds when trying to get out of bed Having extreme difficulty trying to clean self after BM's  Objective: Vital signs in last 24 hours: Temp:  [97.8 F (36.6 C)-98.3 F (36.8 C)] 98.3 F (36.8 C) (10/09 1100) Pulse Rate:  [77-88] 78  (10/09 1100) Resp:  [18-20] 20  (10/09 1100) BP: (131-167)/(53-82) 143/72 mmHg (10/09 1100) SpO2:  [93 %-98 %] 95 % (10/09 1100) Last BM Date: 01/22/12  Intake/Output from previous day: 10/08 0701 - 10/09 0700 In: 873.7 [P.O.:320; I.V.:553.7] Out: -  Intake/Output this shift: Total I/O In: 120 [P.O.:120] Out: -   Abdomen obese, soft, wound stable Lungs clear  Lab Results:   Basename 01/22/12 0340 01/20/12 0355  WBC 9.8 13.1*  HGB 8.3* 7.2*  HCT 28.2* 25.2*  PLT 296 314   BMET  Basename 01/20/12 0355  NA 138  K 4.2  CL 99  CO2 34*  GLUCOSE 171*  BUN 15  CREATININE 0.92  CALCIUM 9.1   PT/INR No results found for this basename: LABPROT:2,INR:2 in the last 72 hours ABG No results found for this basename: PHART:2,PCO2:2,PO2:2,HCO3:2 in the last 72 hours  Studies/Results: No results found.  Anti-infectives: Anti-infectives     Start     Dose/Rate Route Frequency Ordered Stop   01/18/12 1000   ertapenem (INVANZ) 1 g in sodium chloride 0.9 % 50 mL IVPB        1 g 100 mL/hr over 30 Minutes Intravenous Every 24 hours 01/17/12 1347 01/18/12 1130   01/17/12 0709   ertapenem (INVANZ) 1 g in sodium chloride 0.9 % 50 mL IVPB        1 g 100 mL/hr over 30 Minutes Intravenous 60 min pre-op 01/17/12 0709 01/17/12 0954          Assessment/Plan: s/p Procedure(s) (LRB) with comments: LAPAROSCOPIC PARTIAL COLECTOMY (N/A) - attempted PARTIAL COLECTOMY () PROCTOSCOPY ()  Progress limited by patient's super obesity. Approaching discharge Will need home health/case manager assessment for home needs at discharge.  Wound still has wics that I plan of removing  tomorrow.  Will probably need daily wound checks.  LOS: 5 days    Brandon Schaefer A 01/22/2012

## 2012-01-23 LAB — GLUCOSE, CAPILLARY
Glucose-Capillary: 163 mg/dL — ABNORMAL HIGH (ref 70–99)
Glucose-Capillary: 117 mg/dL — ABNORMAL HIGH (ref 70–99)
Glucose-Capillary: 148 mg/dL — ABNORMAL HIGH (ref 70–99)

## 2012-01-23 NOTE — Progress Notes (Signed)
6 Days Post-Op  Subjective: Improving Still having difficulty with self care  Objective: Vital signs in last 24 hours: Temp:  [98.2 F (36.8 C)-99.2 F (37.3 C)] 98.2 F (36.8 C) (10/10 0624) Pulse Rate:  [78-101] 83  (10/10 0624) Resp:  [18-20] 18  (10/10 0624) BP: (142-157)/(52-72) 151/60 mmHg (10/10 0757) SpO2:  [91 %-97 %] 97 % (10/10 0624) Last BM Date: 01/22/12  Intake/Output from previous day: 10/09 0701 - 10/10 0700 In: 960.3 [P.O.:480; I.V.:480.3] Out: 0  Intake/Output this shift:    Abdomen soft, wounds stable I removed the wicks Minimal tenderness  Lab Results:   Basename 01/22/12 0340  WBC 9.8  HGB 8.3*  HCT 28.2*  PLT 296   BMET No results found for this basename: NA:2,K:2,CL:2,CO2:2,GLUCOSE:2,BUN:2,CREATININE:2,CALCIUM:2 in the last 72 hours PT/INR No results found for this basename: LABPROT:2,INR:2 in the last 72 hours ABG No results found for this basename: PHART:2,PCO2:2,PO2:2,HCO3:2 in the last 72 hours  Studies/Results: No results found.  Anti-infectives: Anti-infectives     Start     Dose/Rate Route Frequency Ordered Stop   01/18/12 1000   ertapenem (INVANZ) 1 g in sodium chloride 0.9 % 50 mL IVPB        1 g 100 mL/hr over 30 Minutes Intravenous Every 24 hours 01/17/12 1347 01/18/12 1130   01/17/12 0709   ertapenem (INVANZ) 1 g in sodium chloride 0.9 % 50 mL IVPB        1 g 100 mL/hr over 30 Minutes Intravenous 60 min pre-op 01/17/12 0709 01/17/12 0954          Assessment/Plan: s/p Procedure(s) (LRB) with comments: LAPAROSCOPIC PARTIAL COLECTOMY (N/A) - attempted PARTIAL COLECTOMY () PROCTOSCOPY ()  Saline lock IV Monitor wound to see if drainage increases with wicks out Plan discharge in a.m. With home health  LOS: 6 days    Codie Hainer A 01/23/2012

## 2012-01-23 NOTE — Progress Notes (Signed)
PT Cancellation Note  ___Treatment cancelled today due to medical issues with patient which prohibited therapy  ___ Treatment cancelled today due to patient receiving procedure or test   ___ Treatment cancelled today due to patient's refusal to participate   _X_ Pt declined due to c/o increased pain today.  Stated he has been OOB to BR and moving fine.  Did not want to attempt any OOB activity right now.  Stated he might go home tomorrow.  Felecia Shelling  PTA [facility]  Acute  Rehab Pager     [phone]

## 2012-01-24 MED ORDER — OXYCODONE-ACETAMINOPHEN 7.5-325 MG PO TABS: 1.0000 | ORAL_TABLET | ORAL | Status: DC | PRN

## 2012-01-24 NOTE — Progress Notes (Signed)
Patient ID: Brandon Schaefer, male   DOB: May 21, 1955, 56 y.o.   MRN: [id number]  Doing well today AF/VSS Lungs clear Abdomen soft, wound stable  Plan: Discharge home

## 2012-01-24 NOTE — Discharge Summary (Signed)
Physician Discharge Summary  Patient ID: Brandon Schaefer MRN: [id number] DOB/AGE: [birth year] 56 y.o.  Admit date: 01/17/2012 Discharge date: 01/24/2012  Admission Diagnoses:  Discharge Diagnoses:  Principal Problem:  *Cancer of sigmoid colon Active Problems:  Morbid obesity with body mass index of 60.0-69.9 in adult  Respiratory failure, post-operative  DM (diabetes mellitus)  HTN (hypertension) anemia of chronic disease  Discharged Condition: good  Hospital Course: admitted for elective partial colectomy.  Left intubated post op secondary to super morbid obesity.  Extubated later that day post op by CCM.  Transfused PRBC's during admission for chronic anemia from cancer.  Bowel function resumed by POD#3.  Activity limited by obesity.  Diet advanced.  Ready for discharge 1 week post op tolerating a diet and having BM's  Consults: pulmonary/intensive care  Significant Diagnostic Studies:   Treatments: surgery: partial colectomy  Discharge Exam: Blood pressure 143/78, pulse 90, temperature 98 F (36.7 C), temperature source Oral, resp. rate 20, height 6' (1.829 m), weight 449 lb 4.8 oz (203.8 kg), SpO2 99.00%. General appearance: alert, cooperative and no distress Resp: clear to auscultation bilaterally Cardio: regular rate and rhythm, S1, S2 normal, no murmur, click, rub or gallop Incision/Wound: clean wound  Disposition: Final discharge disposition not confirmed  Discharge Orders    Future Appointments: Provider: Department: Dept Phone: Center:   02/05/2012 3:10 PM Shelly Rubenstein, MD [facility] [phone] None     Future Orders Please Complete By Expires   Ambulatory referral to Home Health      Comments:   Please evaluate Brandon Schaefer for admission to [facility].  Disciplines requested: Nursing  Services to provide: [facility]  Physician to follow patient's care (the person listed here will be responsible for signing ongoing orders):  Referring Provider  Requested Start of Care Date: Tomorrow  Special Instructions:  Wound care.  Dry dressing daily.  Home health aid       Medication List     As of 01/24/2012  7:30 AM    TAKE these medications         aspirin EC 81 MG tablet   Take 81 mg by mouth daily.      carvedilol 6.25 MG tablet   Commonly known as: COREG   Take 6.25 mg by mouth 2 (two) times daily with a meal.      fluticasone 50 MCG/ACT nasal spray   Commonly known as: FLONASE   Place 2 sprays into the nose daily.      gabapentin 400 MG capsule   Commonly known as: NEURONTIN   Take 800 mg by mouth 3 (three) times daily.      HYDROcodone-acetaminophen 5-500 MG per tablet   Commonly known as: VICODIN   Take 1 tablet by mouth every 6 (six) hours as needed. Pain      multivitamin with minerals Tabs   Take 1 tablet by mouth daily.      oxyCODONE-acetaminophen 7.5-325 MG per tablet   Commonly known as: PERCOCET   Take 1 tablet by mouth every 4 (four) hours as needed for pain.      pioglitazone-metformin 15-850 MG per tablet   Commonly known as: ACTOPLUS MET   Take 1 tablet by mouth 3 (three) times daily.      ramipril 10 MG capsule   Commonly known as: ALTACE   Take 10 mg by mouth daily before breakfast.      rosuvastatin 20 MG tablet   Commonly known as: CRESTOR  Take 20 mg by mouth daily before breakfast.      sertraline 100 MG tablet   Commonly known as: ZOLOFT   Take 150 mg by mouth daily before breakfast.           Follow-up Information    Follow up with [facility] A, MD. Call in 1 week. (staple removal next week)    Contact information:   [address] [place] [address] [phone]          Signed: Shelly Rubenstein 01/24/2012, 7:30 AM

## 2012-01-28 ENCOUNTER — Telehealth (INDEPENDENT_AMBULATORY_CARE_PROVIDER_SITE_OTHER): Payer: Self-pay

## 2012-01-28 ENCOUNTER — Ambulatory Visit (INDEPENDENT_AMBULATORY_CARE_PROVIDER_SITE_OTHER): Payer: 59 | Admitting: Surgery

## 2012-01-28 ENCOUNTER — Encounter (INDEPENDENT_AMBULATORY_CARE_PROVIDER_SITE_OTHER): Payer: Self-pay | Admitting: Surgery

## 2012-01-28 VITALS — BP 146/86 | HR 112 | Temp 97.6°F | Resp 18 | Ht 72.0 in | Wt >= 6400 oz

## 2012-01-28 DIAGNOSIS — Z09 Encounter for follow-up examination after completed treatment for conditions other than malignant neoplasm: Secondary | ICD-10-CM

## 2012-01-28 DIAGNOSIS — C189 Malignant neoplasm of colon, unspecified: Secondary | ICD-10-CM

## 2012-01-28 NOTE — Progress Notes (Signed)
Subjective:     Patient ID: Brandon Schaefer, male   DOB: 12/22/1955, 56 y.o.   MRN: [id number]  HPI He is here for his first postop visit status post sigmoid colectomy for cancer. He has no complaints. His appetite is improving and he is moving his bowels daily. He denies fevers.  Review of Systems     Objective:   Physical Exam He has super morbid obesity. As I started removing the staples, the lower part of the wound which was opened drained a very large amount of infected seroma fluid. I packed this with gauze.    Assessment:     Postop wound infection    Plan:     I will arrange home health for daily wet to dry dressing changes. I renewed his Percocet and will start him on doxycycline. I will see him back next week.

## 2012-01-28 NOTE — Telephone Encounter (Signed)
I called and spoke to [facility] the RN for [facility] and gave her the new order to do daily wet to dry packing dressing changes.  They will plan for tomorrow.  I told her how his distal incision opened up and we packed it with a large kerlix gauze.  He lives alone but had his sister in law with him today and i think he can be taught.  I also notified Amber per the pt that he is slow getting up so when they ring his doorbell to be patient.

## 2012-01-29 ENCOUNTER — Telehealth: Payer: Self-pay | Admitting: Hematology & Oncology

## 2012-01-29 ENCOUNTER — Other Ambulatory Visit (INDEPENDENT_AMBULATORY_CARE_PROVIDER_SITE_OTHER): Payer: Self-pay

## 2012-01-29 DIAGNOSIS — Z9889 Other specified postprocedural states: Secondary | ICD-10-CM

## 2012-01-29 NOTE — Telephone Encounter (Signed)
Pt aware of 10-29 appointment °

## 2012-01-30 ENCOUNTER — Encounter: Payer: Self-pay | Admitting: *Deleted

## 2012-02-05 ENCOUNTER — Ambulatory Visit (INDEPENDENT_AMBULATORY_CARE_PROVIDER_SITE_OTHER): Payer: 59 | Admitting: Surgery

## 2012-02-05 ENCOUNTER — Encounter (INDEPENDENT_AMBULATORY_CARE_PROVIDER_SITE_OTHER): Payer: Self-pay | Admitting: Surgery

## 2012-02-05 VITALS — BP 136/88 | HR 104 | Temp 98.0°F | Resp 18 | Ht 72.0 in | Wt >= 6400 oz

## 2012-02-05 DIAGNOSIS — Z09 Encounter for follow-up examination after completed treatment for conditions other than malignant neoplasm: Secondary | ICD-10-CM

## 2012-02-05 NOTE — Progress Notes (Signed)
Subjective:     Patient ID: Brandon Schaefer, male   DOB: Nov 18, 1955, 56 y.o.   MRN: [id number]  HPI He is here for a wound check. He is now undergoing wet-to-dry dressing changes twice daily. He is eating well and moving his bowels well.  Review of Systems     Objective:   Physical Exam On exam, there is still some purulent drainage from the lower part of the incision. The rest of the wound looks good    Assessment:     Postop wound infection status post partial colectomy for cancer    Plan:     We will now see if home health can place a wound VAC. I will see him back in 2 weeks

## 2012-02-11 ENCOUNTER — Ambulatory Visit (HOSPITAL_BASED_OUTPATIENT_CLINIC_OR_DEPARTMENT_OTHER): Payer: 59 | Admitting: Hematology & Oncology

## 2012-02-11 ENCOUNTER — Other Ambulatory Visit (HOSPITAL_BASED_OUTPATIENT_CLINIC_OR_DEPARTMENT_OTHER): Payer: 59 | Admitting: Lab

## 2012-02-11 ENCOUNTER — Ambulatory Visit: Payer: 59

## 2012-02-11 VITALS — BP 134/56 | HR 104 | Temp 98.1°F | Resp 22 | Ht 70.0 in | Wt >= 6400 oz

## 2012-02-11 DIAGNOSIS — D649 Anemia, unspecified: Secondary | ICD-10-CM

## 2012-02-11 DIAGNOSIS — E119 Type 2 diabetes mellitus without complications: Secondary | ICD-10-CM

## 2012-02-11 DIAGNOSIS — C187 Malignant neoplasm of sigmoid colon: Secondary | ICD-10-CM

## 2012-02-11 DIAGNOSIS — G589 Mononeuropathy, unspecified: Secondary | ICD-10-CM

## 2012-02-11 LAB — CEA: CEA: 2 ng/mL (ref 0.0–5.0)

## 2012-02-11 LAB — CBC WITH DIFFERENTIAL (CANCER CENTER ONLY)
BASO%: 0.4 % (ref 0.0–2.0)
Eosinophils Absolute: 0.2 10*3/uL (ref 0.0–0.5)
LYMPH%: 17.7 % (ref 14.0–48.0)
MCH: 21.5 pg — ABNORMAL LOW (ref 28.0–33.4)
MCV: 72 fL — ABNORMAL LOW (ref 82–98)
MONO%: 8.5 % (ref 0.0–13.0)
NEUT%: 71.3 % (ref 40.0–80.0)
Platelets: 716 10*3/uL — ABNORMAL HIGH (ref 145–400)
RDW: 21 % — ABNORMAL HIGH (ref 11.1–15.7)

## 2012-02-11 LAB — COMPREHENSIVE METABOLIC PANEL
ALT: 9 U/L (ref 0–53)
BUN: 18 mg/dL (ref 6–23)
CO2: 26 mEq/L (ref 19–32)
Creatinine, Ser: 0.88 mg/dL (ref 0.50–1.35)
Total Bilirubin: 0.4 mg/dL (ref 0.3–1.2)

## 2012-02-11 LAB — LACTATE DEHYDROGENASE: LDH: 185 U/L (ref 94–250)

## 2012-02-11 NOTE — Progress Notes (Signed)
This office note has been dictated.

## 2012-02-12 ENCOUNTER — Telehealth: Payer: Self-pay | Admitting: Hematology & Oncology

## 2012-02-12 ENCOUNTER — Other Ambulatory Visit (HOSPITAL_COMMUNITY)
Admission: RE | Admit: 2012-02-12 | Discharge: 2012-02-12 | Disposition: A | Discharge status: Home / Self Care | Payer: 59 | Source: Ambulatory Visit | Attending: Hematology & Oncology | Admitting: Hematology & Oncology

## 2012-02-12 DIAGNOSIS — C189 Malignant neoplasm of colon, unspecified: Secondary | ICD-10-CM | POA: Insufficient documentation

## 2012-02-12 NOTE — Telephone Encounter (Signed)
Pt is aware of 11-18 appointment and time. He did not have anything to write this down with so I called back and left everything on his voice mail.

## 2012-02-12 NOTE — Progress Notes (Signed)
CC:   [name], M.D. [name], M.D. [name], M.D.  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  HISTORY OF PRESENT ILLNESS:  Mr. Voland is a real nice 56 year old white gentleman.  He is the brother-in-law of my patient, Daxter Paule.  Mr. Polansky has diabetes. He said he has had diabetes for about 10 years.  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  He is morbidly obese.  He has hypertension.  He went to his family doctor, Dr. Babette Relic R. Spear. He was found to be anemic.  Dr. Collins Scotland, instinctively sent him for evaluation with GI.  He did undergo a colonoscopy.  His stools were, I think, found to be heme- positive.  Dr. Evette Cristal did the colonoscopy.  This was done on 09/05.  He was found to have possibly  obstructing lesion in the proximal sigmoid colon.  The mass was biopsied.  The path report ([id number]) showed invasive adenocarcinoma.  Of note, he also underwent an upper endoscopy. Nothing was found that was suspicious.  Biopsies were taken that were negative for Helicobacter.  I do not see where the tumor was sent off for KRAS testing.  I called Pathology myself to have this done.  Mr. Fulfer was then referred to Dr. Magnus Ivan of general surgery.  Dr. Magnus Ivan went ahead and took him to surgery on October 4th.  He underwent a partial colectomy.  The pathology report ([id number]) showed a mildly differentiated adenocarcinoma.  This extended into the pericolonic connective tissue.  Margins were negative.  Eleven lymph nodes were all negative.  He is thus a stage II (T3 N0 M0).  I do not see where he had any type of preop testing with scans.  I did have this tumor sent off for Oncotype testing.  The recurrence score was 36.  This would be indicative of about a 25% risk of recurrence.  Mr. Vanderschaaf had problems with postop recovery.  His incision site got infected.  He has a drainage tube in to help close the wound.  He was on antibiotics  with doxycycline.  He does come in with a rolling walker.  Before he was operated on, he was very healthy.  He was working.  He had a good performance status.  Currently, his performance status is ECOG 1-2.  There is no abdominal pain.  He has had no problems with his bowels or bladder.  He has had no increased leg swelling.  He has had no rashes. He has had no cough or shortness of breath.  He has had occasional headache.  PAST MEDICAL HISTORY:  Remarkable for: 1. Hypertension. 2. Non-insulin-dependent diabetes. 3. Hyperlipidemia.  ALLERGIES:  None.  MEDICATIONS:  Aspirin 81 mg p.o. q. day, Coreg 6.25 mg p.o. b.i.d., Flonase 2 sprays daily, Neurontin 800 mg p.o. t.i.d., Percocet (7.5/325) one p.o. q.4 hours p.r.n., Actoplus Met (15/850) one p.o. t.i.d., Altace 10 mg p.o. q. day, Crestor 20 mg p.o. q. day, and Zoloft 150 mg p.o. q.a.m.  SOCIAL HISTORY:  Negative for tobacco use.  There is no alcohol use.  He has no obvious occupational exposures.  FAMILY HISTORY:  Remarkable for a maternal grandmother with colon cancer.  REVIEW OF SYSTEMS:  As stated in history of present illness.  No additional findings noted on a 12-system review.  PHYSICAL EXAMINATION:  General:  This is a morbidly obese white gentleman in no obvious distress.  He does seem somewhat fatigued. Vital Signs:  Temperature of  98.1, pulse 104, respiratory rate is 18, blood pressure 134/56, weight is 423.  Head and Neck:  Normocephalic, atraumatic skull.  There are no ocular or oral lesions.  There are no palpable cervical or supraclavicular lymph nodes.  Lungs:  With some decreased breath sounds at the Bases.  Cardiac:  Slightly tachycardic, but regular.  He has no murmurs, rubs or bruits.  Abdomen:  Soft with good bowel sounds.  There is a palpable abdominal mass.  He has a laparotomy scar that is dressed.  His drain pump is attached to the a laparotomy site.  There is no fluid wave.  There is no  palpable hepatosplenomegaly.  Back:  No tenderness over the spine, ribs, or hips. Extremities:  Some trace edema in his legs.  He has no stasis dermatitis changes.  He has good range motion of his joints.  Skin:  No rashes, ecchymoses or petechia.  Neurological:  No focal neurological deficits.  LABORATORY STUDIES:  White cell count is 10.3, hemoglobin is 9.4, hematocrit 31.4, platelet count 716.  MCV is 72. His preop CEA was 2.2.  IMPRESSION:  Mr. Schreiner is a 56 year old gentleman with stage II colon cancer.  This is in the sigmoid colon.  I am thankful that we did get the Oncotype assay on him.  I believe that the Oncotype assay is significant for its recurrence.  As such, I believe Mr. Bonano would benefit from systemic chemotherapy.  According to the Oncotype assay, following adjuvant chemotherapy, his risk of recurrence will go down to about 10%.  In fact, he was very active before his surgery.  He is "down a little bit" because of this postoperative wound infection.  He should get better with this.  He does have another 3-4 weeks of this suction pump before he can get this removed.  I think that we can probably start him on adjuvant chemotherapy in about 3-4 weeks.  He will need to have a Port-A-Cath placed.  Because of his diabetes and his neuropathy, I would favor using FOLFIRI in the adjuvant setting.  I think the oxaliplatin would exacerbate his neuropathy.  I do not believe that we would be compromising any effectiveness by using FOLFIRI.  I spoke with Dr. Eliberto Ivory office.  I requested that a Port-A-Cath be placed on November 15th.  I spent a good half-hour with Mr. Bade and his sister-in-law.  I explained to them the rationale for systemic chemotherapy and why I would recommend it.  He understands this.  He agrees to take systemic chemotherapy.  I went over the side effects with him.  Given his size, we will have to be careful with our dosing but hopefully this  will not be a problem.  I told him about the potential for diarrhea.  I told him about the potential for his blood counts to be down.  Again, he understands these.  We will again try to get started on November 18th.  We will then try for 12 cycles.  We will plan to get him back to see [place] on day 1 of cycle 2.    ______________________________ Josph Macho, M.D. PRE/MEDQ  D:  02/11/2012  T:  02/12/2012  Job:  [id number]

## 2012-02-13 ENCOUNTER — Encounter: Payer: Self-pay | Admitting: Hematology & Oncology

## 2012-02-13 NOTE — Progress Notes (Signed)
I want his wound better healed prior to putting in a Port.  His is high risk of infection.  He is so large also that I may need interventional radiology to do it.  I will be seeing him next Monday to discuss

## 2012-02-17 ENCOUNTER — Ambulatory Visit (INDEPENDENT_AMBULATORY_CARE_PROVIDER_SITE_OTHER): Payer: 59 | Admitting: Surgery

## 2012-02-17 VITALS — BP 126/82 | HR 86 | Temp 98.6°F | Resp 20 | Ht 72.0 in | Wt >= 6400 oz

## 2012-02-17 DIAGNOSIS — Z09 Encounter for follow-up examination after completed treatment for conditions other than malignant neoplasm: Secondary | ICD-10-CM

## 2012-02-17 NOTE — Progress Notes (Signed)
Subjective:     Patient ID: Brandon Schaefer, male   DOB: 1955-05-26, 56 y.o.   MRN: [id number]  HPI He is here for a postoperative check. He continues to slowly improve. He has a wound VAC on his midline incision  Review of Systems     Objective:   Physical Exam  his wound is contracting much better.there is still some purulence at the lower part of the incision    Assessment:     Patient status post partial colectomy for colon cancer    Plan:     Oncology has requested a Port-A-Cath to start chemotherapy. I cannot do it a week he has requested as I am unable. I am also worried about the port getting infected given his wound. I could scheduled for the week after. If the oncologist is insistent that it be done prior to November 15, he would need to be scheduled through interventional radiology.

## 2012-02-20 ENCOUNTER — Telehealth: Payer: Self-pay | Admitting: Hematology & Oncology

## 2012-02-20 NOTE — Telephone Encounter (Signed)
Pt is getting port on 11-18 chemo is moved to 11-19. MD aware

## 2012-02-24 ENCOUNTER — Other Ambulatory Visit: Payer: Self-pay

## 2012-02-25 ENCOUNTER — Encounter (INDEPENDENT_AMBULATORY_CARE_PROVIDER_SITE_OTHER): Payer: Self-pay | Admitting: General Surgery

## 2012-02-25 ENCOUNTER — Ambulatory Visit (INDEPENDENT_AMBULATORY_CARE_PROVIDER_SITE_OTHER): Payer: 59 | Admitting: General Surgery

## 2012-02-25 VITALS — BP 158/96 | HR 108 | Temp 97.2°F | Resp 20 | Ht 72.0 in | Wt >= 6400 oz

## 2012-02-25 DIAGNOSIS — C187 Malignant neoplasm of sigmoid colon: Secondary | ICD-10-CM

## 2012-02-25 DIAGNOSIS — T8149XA Infection following a procedure, other surgical site, initial encounter: Secondary | ICD-10-CM

## 2012-02-25 DIAGNOSIS — T8140XA Infection following a procedure, unspecified, initial encounter: Secondary | ICD-10-CM

## 2012-02-25 NOTE — Patient Instructions (Signed)
You have a wound infection.  Because of the wound infection, we are going to delay the insertion of the Port-A-Cath. Our office is instructed to reschedule that approximately 2 weeks later than it was going to be done. This means that the Port-A-Cath will be inserted after Thanksgiving. We will let Dr. Myna Hidalgo know.  We will get wound care instructions to the visiting nurses. The VAC will be discontinued. The wound will be packed daily with gauze and covered with dry gauze bandage. You may shower.  You will be given an appointment to return to see Dr. Magnus Ivan in approximately 10-12 days to be sure that the infection is under control before you get the Port-A-Cath inserted.

## 2012-02-25 NOTE — Progress Notes (Signed)
Patient ID: Brandon Schaefer, male   DOB: [birth year], 56 y.o.   MRN: [id number] History: This is Dr. Eliberto Ivory patient who underwent a laparoscopic sigmoid colectomy for colon cancer on October 4. This was a T3 N0 tumor. Dr. Myna Hidalgo plans chemotherapy and a Port-A-Cath is scheduled for November 18. The patient developed a wound infection and is being managed by visiting nurses. They noticed increased drainage from the lower part of the wound.  They want him to be seen today, at the latest. He is otherwise doing well. Denies fever or chills. Tolerating diet. Having good bowel movements.  Exam: Morbidly obese. Alert. No distress. Moves slowly. Deconditioned. Wife is with him. Abdomen is morbidly obese. Soft. Nontender. Midline wound reveals some superficial gradation tissue the upper extent and the lower end there is a wide area of superficial granulation tissue but on probing the lower wound there is a deep pocket of purulent material. I opened this up thoroughly digitally. The fascia appears intact. It was packed with one-inch gauze. He tolerated this well.  Assessment: Deep incisional wound infection, opened up and packed today Cancer sigmoid colon, stage T3,  N0. Because of his wound infection, it is not safe to insert a Port-A-Cath on November 18 that will need to be rescheduled Hypertension Morbid obesity Diabetes mellitus  Plan: Visiting nurses will be instructed to discontinue the VAC, and pack the wound daily with one-inch gauze. He may shower No indicattion for antibiotics since wound adequately drained and no cellulitis or necrosis. Port-A-Cath insertion for November 18 is canceled Our office will reschedule Port-A-Cath insertion for 2 weeks later The patient return to see Dr. Magnus Ivan in 10-12 days to be sure the wound infection is under control before proceeding with the Port-A-Cath insertion. His chemotherapy will have to be postponed until this infection comes under  control.   Angelia Mould. Derrell Lolling, M.D., [facility] General and Minimally invasive Surgery Breast and Colorectal Surgery Office:   [phone] Pager:   [phone]

## 2012-02-26 ENCOUNTER — Telehealth (INDEPENDENT_AMBULATORY_CARE_PROVIDER_SITE_OTHER): Payer: Self-pay | Admitting: General Surgery

## 2012-02-26 ENCOUNTER — Other Ambulatory Visit (INDEPENDENT_AMBULATORY_CARE_PROVIDER_SITE_OTHER): Payer: Self-pay | Admitting: General Surgery

## 2012-02-26 ENCOUNTER — Encounter (INDEPENDENT_AMBULATORY_CARE_PROVIDER_SITE_OTHER): Payer: Self-pay | Admitting: General Surgery

## 2012-02-26 DIAGNOSIS — T8140XA Infection following a procedure, unspecified, initial encounter: Secondary | ICD-10-CM

## 2012-02-26 NOTE — Progress Notes (Signed)
Faxed request for wound care to the attention on Marrion Coy, RN per Dr. Jacinto Halim request. Patient was seen in urgent office yesterday. Order request submitted in EPIC and fax confirmation received. Faxed to [phone].

## 2012-02-26 NOTE — Telephone Encounter (Signed)
Called [facility] this morning to advise of wound care instructions based on urgent office visit yesterday with Dr. Derrell Lolling. Per Dr. Derrell Lolling they are to discontinue vac and pack the wound with 1 inch gauze daily and cover with dry 4x4's. According to Maye Hides, RN with [facility] the orders need to be faxed over to [phone].

## 2012-02-28 ENCOUNTER — Telehealth: Payer: Self-pay | Admitting: Hematology & Oncology

## 2012-02-28 NOTE — Telephone Encounter (Signed)
Brandon Schaefer called wanting to cancel 11-19 chemo appointment due to port not being placed, transferred call to RN

## 2012-03-02 ENCOUNTER — Ambulatory Visit: Payer: 59

## 2012-03-02 ENCOUNTER — Ambulatory Visit: Payer: 59 | Admitting: Medical

## 2012-03-02 ENCOUNTER — Encounter (HOSPITAL_BASED_OUTPATIENT_CLINIC_OR_DEPARTMENT_OTHER): Admission: RE | Payer: Self-pay | Source: Ambulatory Visit

## 2012-03-02 ENCOUNTER — Other Ambulatory Visit: Payer: 59

## 2012-03-02 ENCOUNTER — Encounter (INDEPENDENT_AMBULATORY_CARE_PROVIDER_SITE_OTHER): Payer: Self-pay | Admitting: Surgery

## 2012-03-02 ENCOUNTER — Ambulatory Visit (INDEPENDENT_AMBULATORY_CARE_PROVIDER_SITE_OTHER): Payer: 59 | Admitting: Surgery

## 2012-03-02 ENCOUNTER — Ambulatory Visit (HOSPITAL_BASED_OUTPATIENT_CLINIC_OR_DEPARTMENT_OTHER): Admission: RE | Admit: 2012-03-02 | Payer: 59 | Source: Ambulatory Visit | Admitting: Surgery

## 2012-03-02 ENCOUNTER — Other Ambulatory Visit: Payer: 59 | Admitting: Lab

## 2012-03-02 VITALS — BP 140/78 | HR 108 | Temp 97.6°F | Resp 24 | Ht 72.0 in | Wt >= 6400 oz

## 2012-03-02 DIAGNOSIS — Z09 Encounter for follow-up examination after completed treatment for conditions other than malignant neoplasm: Secondary | ICD-10-CM

## 2012-03-02 SURGERY — INSERTION, TUNNELED CENTRAL VENOUS DEVICE, WITH PORT
Anesthesia: Choice

## 2012-03-02 NOTE — Progress Notes (Signed)
Subjective:     Patient ID: Brandon Schaefer, male   DOB: Sep 23, 1955, 56 y.o.   MRN: [id number]  HPI He is here for a wound check. This Port-A-Cath insertion had to be canceled because of her recurrent wound infection. This occurred in the deep part of his midline incision underneath the wound VAC.  He is now undergoing wound packing. He has no complaints other than some erythema which is developed on his lower legs bilaterally  Review of Systems     Objective:   Physical Exam The wound is actually very clean. I treated with silver nitrate. I found no further purulence. He does have erythema on his lower legs consistent with chronic venous stasis and mild cellulitis   Assessment:     Patient stable postop    Plan:     He will continue the current wound care. I started him on Keflex regarding his lower extremities. I will also schedule him again for the Port-A-Cath insertion.

## 2012-03-03 ENCOUNTER — Other Ambulatory Visit: Payer: Self-pay | Admitting: Lab

## 2012-03-03 ENCOUNTER — Ambulatory Visit: Payer: Self-pay | Admitting: Hematology & Oncology

## 2012-03-03 ENCOUNTER — Other Ambulatory Visit: Payer: Self-pay

## 2012-03-03 ENCOUNTER — Ambulatory Visit: Payer: Self-pay

## 2012-03-04 ENCOUNTER — Telehealth: Payer: Self-pay | Admitting: Hematology & Oncology

## 2012-03-04 NOTE — Telephone Encounter (Signed)
Pt aware of 12-9, due to port on 12-5. Per Dr. Myna Hidalgo next appointment 12-31 due to Christmas.

## 2012-03-16 ENCOUNTER — Ambulatory Visit: Payer: 59

## 2012-03-16 ENCOUNTER — Other Ambulatory Visit: Payer: 59 | Admitting: Lab

## 2012-03-16 ENCOUNTER — Ambulatory Visit: Payer: 59 | Admitting: Hematology & Oncology

## 2012-03-17 NOTE — Progress Notes (Signed)
Had colon surgery 10/13-got wound infection held up getting PAC-cleared wt with dr fitzgerald and [facility]

## 2012-03-18 NOTE — H&P (Signed)
Brandon Schaefer is an 56 y.o. male.   Chief Complaint: Colon cancer HPI: 56 yo male s/p partical colectomy for colon cancer.  Now presents for port insertion.  He has no complaints  Past Medical History  Diagnosis Date  . Hypertension   . Diabetes mellitus   . Allergy   . Hyperlipidemia   . Anemia   . Cancer   . Anxiety   . Shortness of breath     with exertion  . Headache   . Neuromuscular disorder     peripheral neuropathy    Past Surgical History  Procedure Date  . Fracture surgery      ORIF-left radius has pin  . Pilonidal cyst excision   . Partial colectomy 01/17/2012    Procedure: PARTIAL COLECTOMY;  Surgeon: Shelly Rubenstein, MD;  Location: [facility];  Service: General;;  . Proctoscopy 01/17/2012    Procedure: PROCTOSCOPY;  Surgeon: Shelly Rubenstein, MD;  Location: [facility];  Service: General;;    Family History  Problem Relation Age of Onset  . Diabetes Father   . Cancer Maternal Aunt     colon  . Cancer Maternal Grandmother     colon   Social History:  reports that he has never smoked. He has never used smokeless tobacco. He reports that he does not drink alcohol or use illicit drugs.  Allergies:  Allergies  Allergen Reactions  . Adhesive (Tape)     No prescriptions prior to admission    No results found for this or any previous visit (from the past 48 hour(s)). No results found.  Review of Systems  All other systems reviewed and are negative.    There were no vitals taken for this visit. Physical Exam  Constitutional: He is oriented to person, place, and time.       Morbidly obese  HENT:  Head: Normocephalic.  Eyes: Conjunctivae normal are normal. Pupils are equal, round, and reactive to light.  Neck: Normal range of motion. No tracheal deviation present.  Cardiovascular: Normal rate, regular rhythm, normal heart sounds and intact distal pulses.   Respiratory: Effort normal and breath sounds normal. No respiratory distress. He has no wheezes.   GI: Soft.       Incision/wound clean  Neurological: He is alert and oriented to person, place, and time.  Skin: Skin is warm and dry. No erythema.  Psychiatric: His behavior is normal. Judgment normal.     Assessment/Plan Need for vascular access for chemo  Plan port a cath insertion.  The procedure including risks of bleeding, infection, pneumothorax, etc. Discussed.  Likelihood of success is good.  Brandon Schaefer A 03/18/2012, 7:36 PM

## 2012-03-19 ENCOUNTER — Encounter (HOSPITAL_BASED_OUTPATIENT_CLINIC_OR_DEPARTMENT_OTHER): Payer: Self-pay | Admitting: Anesthesiology

## 2012-03-19 ENCOUNTER — Ambulatory Visit (HOSPITAL_BASED_OUTPATIENT_CLINIC_OR_DEPARTMENT_OTHER): Payer: 59 | Admitting: Anesthesiology

## 2012-03-19 ENCOUNTER — Ambulatory Visit (HOSPITAL_COMMUNITY): Payer: 59

## 2012-03-19 ENCOUNTER — Encounter (HOSPITAL_BASED_OUTPATIENT_CLINIC_OR_DEPARTMENT_OTHER)
Admission: RE | Disposition: A | Discharge status: Home / Self Care | Payer: Self-pay | Source: Ambulatory Visit | Attending: Surgery

## 2012-03-19 ENCOUNTER — Ambulatory Visit (HOSPITAL_BASED_OUTPATIENT_CLINIC_OR_DEPARTMENT_OTHER)
Admission: RE | Admit: 2012-03-19 | Discharge: 2012-03-19 | Disposition: A | Discharge status: Home / Self Care | Payer: 59 | Source: Ambulatory Visit | Attending: Surgery | Admitting: Surgery

## 2012-03-19 ENCOUNTER — Encounter (HOSPITAL_BASED_OUTPATIENT_CLINIC_OR_DEPARTMENT_OTHER): Payer: Self-pay | Admitting: *Deleted

## 2012-03-19 DIAGNOSIS — E119 Type 2 diabetes mellitus without complications: Secondary | ICD-10-CM | POA: Insufficient documentation

## 2012-03-19 DIAGNOSIS — C187 Malignant neoplasm of sigmoid colon: Secondary | ICD-10-CM

## 2012-03-19 DIAGNOSIS — I10 Essential (primary) hypertension: Secondary | ICD-10-CM | POA: Insufficient documentation

## 2012-03-19 DIAGNOSIS — C189 Malignant neoplasm of colon, unspecified: Secondary | ICD-10-CM

## 2012-03-19 HISTORY — PX: PORTACATH PLACEMENT: SHX2246

## 2012-03-19 LAB — POCT I-STAT, CHEM 8
Calcium, Ion: 1.05 mmol/L — ABNORMAL LOW (ref 1.12–1.23)
Creatinine, Ser: 0.7 mg/dL (ref 0.50–1.35)
Glucose, Bld: 152 mg/dL — ABNORMAL HIGH (ref 70–99)
HCT: 31 % — ABNORMAL LOW (ref 39.0–52.0)
Hemoglobin: 10.5 g/dL — ABNORMAL LOW (ref 13.0–17.0)
TCO2: 27 mmol/L (ref 0–100)

## 2012-03-19 SURGERY — INSERTION, TUNNELED CENTRAL VENOUS DEVICE, WITH PORT
Anesthesia: Monitor Anesthesia Care | Site: Chest | Wound class: Clean

## 2012-03-19 MED ORDER — OXYCODONE HCL 5 MG/5ML PO SOLN
5.0000 mg | Freq: Once | ORAL | Status: DC | PRN
Start: 2012-03-19 — End: 2012-03-19

## 2012-03-19 MED ORDER — MORPHINE SULFATE 4 MG/ML IJ SOLN
4.0000 mg | INTRAMUSCULAR | Status: DC | PRN
Start: 2012-03-19 — End: 2012-03-19

## 2012-03-19 MED ORDER — SODIUM CHLORIDE 0.9 % IV SOLN: 250.0000 mL | INTRAVENOUS | Status: DC | PRN

## 2012-03-19 MED ORDER — OXYCODONE HCL 5 MG PO TABS: 5.0000 mg | ORAL_TABLET | Freq: Once | ORAL | Status: DC | PRN

## 2012-03-19 MED ORDER — HEPARIN (PORCINE) IN NACL 2-0.9 UNIT/ML-% IJ SOLN
INTRAMUSCULAR | Status: DC | PRN
  Administered 2012-03-19: 1 via INTRAVENOUS

## 2012-03-19 MED ORDER — DEXTROSE 5 % IV SOLN
3.0000 g | INTRAVENOUS | Status: AC
  Administered 2012-03-19: 3 g via INTRAVENOUS

## 2012-03-19 MED ORDER — ONDANSETRON HCL 4 MG/2ML IJ SOLN: 4.0000 mg | Freq: Four times a day (QID) | INTRAMUSCULAR | Status: DC | PRN

## 2012-03-19 MED ORDER — ACETAMINOPHEN 650 MG RE SUPP: 650.0000 mg | RECTAL | Status: DC | PRN

## 2012-03-19 MED ORDER — CEFAZOLIN SODIUM 10 G IJ SOLR: 3.0000 g | INTRAMUSCULAR | Status: DC

## 2012-03-19 MED ORDER — SODIUM CHLORIDE 0.9 % IJ SOLN: 3.0000 mL | INTRAMUSCULAR | Status: DC | PRN

## 2012-03-19 MED ORDER — HYDROMORPHONE HCL PF 1 MG/ML IJ SOLN: 0.2500 mg | INTRAMUSCULAR | Status: DC | PRN

## 2012-03-19 MED ORDER — ACETAMINOPHEN 325 MG PO TABS: 650.0000 mg | ORAL_TABLET | ORAL | Status: DC | PRN

## 2012-03-19 MED ORDER — OXYCODONE-ACETAMINOPHEN 7.5-325 MG PO TABS: 1.0000 | ORAL_TABLET | ORAL | Status: DC | PRN

## 2012-03-19 MED ORDER — HEPARIN SOD (PORK) LOCK FLUSH 100 UNIT/ML IV SOLN
INTRAVENOUS | Status: DC | PRN
  Administered 2012-03-19: 500 [IU] via INTRAVENOUS

## 2012-03-19 MED ORDER — OXYCODONE HCL 5 MG PO TABS: 5.0000 mg | ORAL_TABLET | ORAL | Status: DC | PRN

## 2012-03-19 MED ORDER — LACTATED RINGERS IV SOLN
INTRAVENOUS | Status: DC
  Administered 2012-03-19: 20 mL/h via INTRAVENOUS

## 2012-03-19 MED ORDER — SODIUM CHLORIDE 0.9 % IJ SOLN: 3.0000 mL | Freq: Two times a day (BID) | INTRAMUSCULAR | Status: DC

## 2012-03-19 MED ORDER — LIDOCAINE HCL (PF) 1 % IJ SOLN
INTRAMUSCULAR | Status: DC | PRN
  Administered 2012-03-19: 18 mL

## 2012-03-19 SURGICAL SUPPLY — 45 items
BAG DECANTER FOR FLEXI CONT (MISCELLANEOUS) ×2 IMPLANT
BENZOIN TINCTURE PRP APPL 2/3 (GAUZE/BANDAGES/DRESSINGS) ×2 IMPLANT
BLADE HEX COATED 2.75 (ELECTRODE) ×2 IMPLANT
BLADE SURG 15 STRL LF DISP TIS (BLADE) ×1 IMPLANT
BLADE SURG 15 STRL SS (BLADE) ×1
CANISTER SUCTION 1200CC (MISCELLANEOUS) IMPLANT
CHLORAPREP W/TINT 26ML (MISCELLANEOUS) ×2 IMPLANT
CLOTH BEACON ORANGE TIMEOUT ST (SAFETY) ×2 IMPLANT
COVER MAYO STAND STRL (DRAPES) ×2 IMPLANT
COVER TABLE BACK 60X90 (DRAPES) ×2 IMPLANT
DECANTER SPIKE VIAL GLASS SM (MISCELLANEOUS) IMPLANT
DRAPE C-ARM 42X72 X-RAY (DRAPES) ×2 IMPLANT
DRAPE LAPAROSCOPIC ABDOMINAL (DRAPES) ×2 IMPLANT
DRAPE UTILITY XL STRL (DRAPES) ×2 IMPLANT
DRSG TEGADERM 4X4.75 (GAUZE/BANDAGES/DRESSINGS) ×2 IMPLANT
ELECT REM PT RETURN 9FT ADLT (ELECTROSURGICAL) ×2
ELECTRODE REM PT RTRN 9FT ADLT (ELECTROSURGICAL) ×1 IMPLANT
GAUZE SPONGE 4X4 12PLY STRL LF (GAUZE/BANDAGES/DRESSINGS) ×2 IMPLANT
GLOVE BIOGEL PI IND STRL 7.0 (GLOVE) ×1 IMPLANT
GLOVE BIOGEL PI INDICATOR 7.0 (GLOVE) ×1
GLOVE ECLIPSE 7.0 STRL STRAW (GLOVE) ×2 IMPLANT
GLOVE SURG SIGNA 7.5 PF LTX (GLOVE) ×2 IMPLANT
GOWN PREVENTION PLUS XLARGE (GOWN DISPOSABLE) ×2 IMPLANT
GOWN PREVENTION PLUS XXLARGE (GOWN DISPOSABLE) ×2 IMPLANT
IV HEPARIN 1000UNITS/500ML (IV SOLUTION) ×2 IMPLANT
IV KIT MINILOC 20X1 SAFETY (NEEDLE) IMPLANT
KIT PORT POWER 8FR ISP CVUE (Catheter) ×2 IMPLANT
KIT PORT POWER 9.6FR MRI PREA (Catheter) IMPLANT
NEEDLE HYPO 25X1 1.5 SAFETY (NEEDLE) ×2 IMPLANT
PACK BASIN DAY SURGERY FS (CUSTOM PROCEDURE TRAY) ×2 IMPLANT
PENCIL BUTTON HOLSTER BLD 10FT (ELECTRODE) ×2 IMPLANT
SLEEVE SCD COMPRESS KNEE MED (MISCELLANEOUS) ×2 IMPLANT
STRIP CLOSURE SKIN 1/2X4 (GAUZE/BANDAGES/DRESSINGS) ×2 IMPLANT
SUT MNCRL AB 4-0 PS2 18 (SUTURE) ×2 IMPLANT
SUT PROLENE 2 0 SH DA (SUTURE) ×2 IMPLANT
SUT SILK 2 0 TIES 17X18 (SUTURE)
SUT SILK 2-0 18XBRD TIE BLK (SUTURE) IMPLANT
SUT VIC AB 3-0 SH 27 (SUTURE) ×1
SUT VIC AB 3-0 SH 27X BRD (SUTURE) ×1 IMPLANT
SYR CONTROL 10ML LL (SYRINGE) ×2 IMPLANT
TOWEL OR 17X24 6PK STRL BLUE (TOWEL DISPOSABLE) ×2 IMPLANT
TOWEL OR NON WOVEN STRL DISP B (DISPOSABLE) ×2 IMPLANT
TUBE CONNECTING 20X1/4 (TUBING) IMPLANT
WATER STERILE IRR 1000ML POUR (IV SOLUTION) ×2 IMPLANT
YANKAUER SUCT BULB TIP NO VENT (SUCTIONS) IMPLANT

## 2012-03-19 NOTE — Transfer of Care (Signed)
Immediate Anesthesia Transfer of Care Note  Patient: Brandon Schaefer  Procedure(s) Performed: Procedure(s) (LRB) with comments: INSERTION PORT-A-CATH (N/A)  Patient Location: PACU  Anesthesia Type:MAC  Level of Consciousness: awake, alert  and oriented  Airway & Oxygen Therapy: Patient Spontanous Breathing  Post-op Assessment: Report given to PACU RN and Post -op Vital signs reviewed and stable  Post vital signs: Reviewed and stable  Complications: No apparent anesthesia complications

## 2012-03-19 NOTE — Interval H&P Note (Signed)
History and Physical Interval Note: no change in H and P  03/19/2012 9:06 AM  Brandon Schaefer  has presented today for surgery, with the diagnosis of colon cancer  The various methods of treatment have been discussed with the patient and family. After consideration of risks, benefits and other options for treatment, the patient has consented to  Procedure(s) (LRB) with comments: INSERTION PORT-A-CATH (N/A) as a surgical intervention .  The patient's history has been reviewed, patient examined, no change in status, stable for surgery.  I have reviewed the patient's chart and labs.  Questions were answered to the patient's satisfaction.     Ladell Lea A

## 2012-03-19 NOTE — Anesthesia Preprocedure Evaluation (Signed)
Anesthesia Evaluation  Patient identified by MRN, date of birth, ID band Patient awake    Reviewed: Allergy & Precautions, H&P , NPO status , Patient's Chart, lab work & pertinent test results, reviewed documented beta blocker date and time   Airway Mallampati: III TM Distance: >3 FB Neck ROM: Full    Dental No notable dental hx. (+) Poor Dentition and Dental Advisory Given   Pulmonary neg pulmonary ROS,  breath sounds clear to auscultation  Pulmonary exam normal       Cardiovascular hypertension, On Home Beta Blockers and On Medications Rhythm:Regular Rate:Normal     Neuro/Psych  Headaches,  Neuromuscular disease negative neurological ROS  negative psych ROS   GI/Hepatic negative GI ROS, Neg liver ROS,   Endo/Other  diabetes, Type 2, Oral Hypoglycemic AgentsMorbid obesity  Renal/GU negative Renal ROS  negative genitourinary   Musculoskeletal   Abdominal   Peds  Hematology negative hematology ROS (+)   Anesthesia Other Findings   Reproductive/Obstetrics negative OB ROS                           Anesthesia Physical Anesthesia Plan  ASA: III  Anesthesia Plan: MAC   Post-op Pain Management:    Induction: Intravenous  Airway Management Planned: Simple Face Mask  Additional Equipment:   Intra-op Plan:   Post-operative Plan:   Informed Consent: I have reviewed the patients History and Physical, chart, labs and discussed the procedure including the risks, benefits and alternatives for the proposed anesthesia with the patient or authorized representative who has indicated his/her understanding and acceptance.   Dental advisory given  Plan Discussed with: CRNA  Anesthesia Plan Comments:         Anesthesia Quick Evaluation

## 2012-03-19 NOTE — Anesthesia Postprocedure Evaluation (Signed)
  Anesthesia Post-op Note  Patient: Brandon Schaefer  Procedure(s) Performed: Procedure(s) (LRB) with comments: INSERTION PORT-A-CATH (N/A)  Patient Location: PACU  Anesthesia Type:MAC  Level of Consciousness: awake, alert  and oriented  Airway and Oxygen Therapy: Patient Spontanous Breathing  Post-op Pain: none  Post-op Assessment: Post-op Vital signs reviewed, Patient's Cardiovascular Status Stable, Respiratory Function Stable, Patent Airway and No signs of Nausea or vomiting  Post-op Vital Signs: Reviewed and stable  Complications: No apparent anesthesia complications

## 2012-03-19 NOTE — Anesthesia Procedure Notes (Addendum)
Performed by: Zenia Resides D   Procedure Name: MAC Date/Time: 03/19/2012 10:37 AM Performed by: Zenia Resides D Pre-anesthesia Checklist: Patient identified, Emergency Drugs available, Suction available and Patient being monitored Patient Re-evaluated:Patient Re-evaluated prior to inductionOxygen Delivery Method: Nasal cannula Placement Confirmation: CO2 detector

## 2012-03-19 NOTE — Op Note (Signed)
INSERTION PORT-A-CATH  Procedure Note  Brandon Schaefer 03/19/2012   Pre-op Diagnosis: colon cancer     Post-op Diagnosis: same  Procedure(s): LEFT SUBCLAVIAN INSERTION PORT-A-CATH (8 FR)  Surgeon(s): Shelly Rubenstein, MD  Anesthesia: Local  Staff:  Chrystine Oiler, RN - Circulator Claudius Sis, RN - Scrub Person  Estimated Blood Loss: Minimal                         [name]   Date: 03/19/2012  Time: 11:19 AM

## 2012-03-20 ENCOUNTER — Encounter (HOSPITAL_BASED_OUTPATIENT_CLINIC_OR_DEPARTMENT_OTHER): Payer: Self-pay | Admitting: Surgery

## 2012-03-20 NOTE — Addendum Note (Signed)
Addendum  created 03/20/12 1210 by Lance Coon, CRNA   Modules edited:Charges VN

## 2012-03-20 NOTE — Op Note (Signed)
NAME:  DIVIT, STIPP NO.:  [id number]  MEDICAL RECORD NO.:  [id number]  LOCATION:                                 FACILITY:  PHYSICIAN:  [name], M.D. DATE OF BIRTH:  September 26, 1955  DATE OF PROCEDURE: DATE OF DISCHARGE:                              OPERATIVE REPORT   PREOPERATIVE DIAGNOSIS:  Colon cancer.  POSTOPERATIVE DIAGNOSIS:  Colon cancer.  PROCEDURE:  Left subclavian Port-A-Cath insertion.  SURGEON:  [name], M.D.  ANESTHESIA:  1% lidocaine and monitored anesthesia care.  ESTIMATED BLOOD LOSS:  Minimal.  PROCEDURE:  The patient was brought to the operating room, identified as Brandon Schaefer.  He was placed supine on the operative room table and anesthesia was induced.  His left chest and neck were then prepped and draped in usual sterile fashion.  I anesthetized the skin and clavicle with 1% lidocaine.  I then used the introducer needle to easily cannulate the left subclavian vein.  A guidewire was then passed through the needle into the central venous system under direct fluoroscopy.  I then removed the introducer needle.  Next, I anesthetized skin further with lidocaine.  I made an incision at the wire introduction site with a scalpel and took this down into the subcutaneous tissue with electrocautery.  I then created a pocket for the Port-A-Cath.  An 8- [place] power port was then brought into the field.  I attached the catheter to the port and then locked it in place.  I then cut it in appropriate length.  I then flushed the port with saline.  I placed the port into the pocket.  I then passed the introducer sheath and guide wire over the wire and into central venous system.  The guidewire and dilator were then removed.  I then fed the catheter down the peel-away sheath and into the central venous system.  Fluoroscopy confirmed good placement in the superior vena cava.  I then again accessed the port and good flush and return  were demonstrated.  I then instilled concentrated heparin solution into the port.  The port was then sewn to the chest wall with interrupted 2-0 Prolene sutures.  I closed the subcutaneous tissue with interrupted 3-0 Vicryl sutures and closed the skin with a running 4-0 Monocryl.  Steri-Strips, gauze, and Tegaderm were then applied.  The patient tolerated the procedure well.  All counts were correct at the end of procedure.  The patient was then taken in a stable condition from the operating room to the recovery room.    [name], M.D.    DB/MEDQ  D:  03/19/2012  T:  03/20/2012  Job:  [id number]

## 2012-03-23 ENCOUNTER — Other Ambulatory Visit (HOSPITAL_BASED_OUTPATIENT_CLINIC_OR_DEPARTMENT_OTHER): Payer: 59 | Admitting: Lab

## 2012-03-23 ENCOUNTER — Other Ambulatory Visit: Payer: Self-pay | Admitting: *Deleted

## 2012-03-23 ENCOUNTER — Ambulatory Visit (HOSPITAL_BASED_OUTPATIENT_CLINIC_OR_DEPARTMENT_OTHER): Payer: 59 | Admitting: Hematology & Oncology

## 2012-03-23 ENCOUNTER — Ambulatory Visit (HOSPITAL_BASED_OUTPATIENT_CLINIC_OR_DEPARTMENT_OTHER): Payer: 59

## 2012-03-23 ENCOUNTER — Ambulatory Visit: Payer: 59

## 2012-03-23 VITALS — BP 116/50 | HR 96 | Temp 98.3°F | Resp 20 | Ht 72.0 in | Wt >= 6400 oz

## 2012-03-23 DIAGNOSIS — Z5111 Encounter for antineoplastic chemotherapy: Secondary | ICD-10-CM

## 2012-03-23 DIAGNOSIS — C187 Malignant neoplasm of sigmoid colon: Secondary | ICD-10-CM

## 2012-03-23 LAB — CBC WITH DIFFERENTIAL (CANCER CENTER ONLY)
BASO#: 0 10e3/uL (ref 0.0–0.2)
BASO%: 0.3 % (ref 0.0–2.0)
EOS%: 4 % (ref 0.0–7.0)
Eosinophils Absolute: 0.4 10e3/uL (ref 0.0–0.5)
HCT: 32.1 % — ABNORMAL LOW (ref 38.7–49.9)
HGB: 9.5 g/dL — ABNORMAL LOW (ref 13.0–17.1)
LYMPH#: 1.4 10e3/uL (ref 0.9–3.3)
LYMPH%: 16 % (ref 14.0–48.0)
MCH: 21.5 pg — ABNORMAL LOW (ref 28.0–33.4)
MCHC: 29.6 g/dL — ABNORMAL LOW (ref 32.0–35.9)
MCV: 73 fL — ABNORMAL LOW (ref 82–98)
MONO#: 0.7 10e3/uL (ref 0.1–0.9)
MONO%: 7.8 % (ref 0.0–13.0)
NEUT#: 6.4 10e3/uL (ref 1.5–6.5)
NEUT%: 71.9 % (ref 40.0–80.0)
Platelets: 393 10e3/uL (ref 145–400)
RBC: 4.42 10e6/uL (ref 4.20–5.70)
RDW: 20.3 % — ABNORMAL HIGH (ref 11.1–15.7)
WBC: 8.9 10e3/uL (ref 4.0–10.0)

## 2012-03-23 LAB — COMPREHENSIVE METABOLIC PANEL
AST: 12 U/L (ref 0–37)
Alkaline Phosphatase: 80 U/L (ref 39–117)
BUN: 13 mg/dL (ref 6–23)
Creatinine, Ser: 0.62 mg/dL (ref 0.50–1.35)
Glucose, Bld: 134 mg/dL — ABNORMAL HIGH (ref 70–99)
Potassium: 4.7 mEq/L (ref 3.5–5.3)
Total Bilirubin: 0.3 mg/dL (ref 0.3–1.2)

## 2012-03-23 MED ORDER — SODIUM CHLORIDE 0.9 % IV SOLN
Freq: Once | INTRAVENOUS | Status: AC
  Administered 2012-03-23: 10:00:00 via INTRAVENOUS

## 2012-03-23 MED ORDER — ATROPINE SULFATE 1 MG/ML IJ SOLN
0.5000 mg | Freq: Once | INTRAMUSCULAR | Status: AC | PRN
  Administered 2012-03-23: 0.5 mg via INTRAVENOUS

## 2012-03-23 MED ORDER — DEXAMETHASONE SODIUM PHOSPHATE 4 MG/ML IJ SOLN
20.0000 mg | Freq: Once | INTRAMUSCULAR | Status: AC
  Administered 2012-03-23: 20 mg via INTRAVENOUS

## 2012-03-23 MED ORDER — LORAZEPAM 1 MG PO TABS: 1.0000 mg | ORAL_TABLET | Freq: Three times a day (TID) | ORAL | Status: DC | PRN

## 2012-03-23 MED ORDER — PROCHLORPERAZINE MALEATE 10 MG PO TABS: 10.0000 mg | ORAL_TABLET | Freq: Four times a day (QID) | ORAL | Status: DC | PRN

## 2012-03-23 MED ORDER — IRINOTECAN HCL CHEMO INJECTION 100 MG/5ML
181.5000 mg/m2 | Freq: Once | INTRAVENOUS | Status: AC
  Administered 2012-03-23: 560 mg via INTRAVENOUS
  Filled 2012-03-23: qty 28

## 2012-03-23 MED ORDER — LEUCOVORIN CALCIUM INJECTION 350 MG
400.0000 mg/m2 | Freq: Once | INTRAVENOUS | Status: AC
  Administered 2012-03-23: 1232 mg via INTRAVENOUS
  Filled 2012-03-23: qty 61.6

## 2012-03-23 MED ORDER — ONDANSETRON 16 MG/50ML IVPB (CHCC)
16.0000 mg | Freq: Once | INTRAVENOUS | Status: AC
  Administered 2012-03-23: 16 mg via INTRAVENOUS

## 2012-03-23 MED ORDER — HEPARIN SOD (PORK) LOCK FLUSH 100 UNIT/ML IV SOLN
500.0000 [IU] | Freq: Once | INTRAVENOUS | Status: DC | PRN
  Filled 2012-03-23: qty 5

## 2012-03-23 MED ORDER — LIDOCAINE-PRILOCAINE 2.5-2.5 % EX CREA: TOPICAL_CREAM | CUTANEOUS | Status: DC | PRN

## 2012-03-23 MED ORDER — SODIUM CHLORIDE 0.9 % IJ SOLN
10.0000 mL | INTRAMUSCULAR | Status: DC | PRN
  Filled 2012-03-23: qty 10

## 2012-03-23 MED ORDER — SODIUM CHLORIDE 0.9 % IV SOLN
1920.0000 mg/m2 | INTRAVENOUS | Status: DC
  Administered 2012-03-23: 5900 mg via INTRAVENOUS
  Filled 2012-03-23: qty 118

## 2012-03-23 MED ORDER — ONDANSETRON HCL 8 MG PO TABS: 8.0000 mg | ORAL_TABLET | Freq: Two times a day (BID) | ORAL | Status: DC

## 2012-03-23 MED ORDER — FLUOROURACIL CHEMO INJECTION 2.5 GM/50ML
400.0000 mg/m2 | Freq: Once | INTRAVENOUS | Status: AC
  Administered 2012-03-23: 1250 mg via INTRAVENOUS
  Filled 2012-03-23: qty 25

## 2012-03-23 MED ORDER — LORAZEPAM 2 MG/ML IJ SOLN
1.0000 mg | Freq: Once | INTRAMUSCULAR | Status: AC
  Administered 2012-03-23: 1 mg via INTRAVENOUS

## 2012-03-23 NOTE — Patient Instructions (Addendum)
[facility] Discharge Instructions for Patients Receiving Chemotherapy  Today you received the following chemotherapy agents 5FU and Camptosar  To help prevent nausea and vomiting after your treatment, we encourage you to take your nausea medication   1)Zofran 8mg  twice daily Days 2,3,4 post chemo, Begin taking 03/24/12 and Take 03/25/12 and 03/26/12.  Take twice daily to prevent nausea.  Can also take this every 8 hours as needed if Nausea continues to be a problem  2)Compazine 10 mg by mouth as needed for nausea  3) Ativan 1 mg by mouth every 8 hours as needed for nausea, vomiting or anxiety   If you develop nausea and vomiting that is not controlled by your nausea medication, call our office at [phone].   If it is after clinic hours your family physician or the after hours number for the clinic or go to the Emergency Department.   BELOW ARE SYMPTOMS THAT SHOULD BE REPORTED IMMEDIATELY:  *FEVER GREATER THAN 100.5 F  *CHILLS WITH OR WITHOUT FEVER  NAUSEA AND VOMITING THAT IS NOT CONTROLLED WITH YOUR NAUSEA MEDICATION  *UNUSUAL SHORTNESS OF BREATH  *UNUSUAL BRUISING OR BLEEDING  TENDERNESS IN MOUTH AND THROAT WITH OR WITHOUT PRESENCE OF ULCERS  *URINARY PROBLEMS  *BOWEL PROBLEMS  UNUSUAL RASH Items with * indicate a potential emergency and should be followed up as soon as possible.  One of the nurses will contact you 24 hours after your treatment. Please let the nurse know about any problems that you may have experienced. Feel free to call the clinic you have any questions or concerns. The clinic phone number is [phone].   I have been informed and understand all the instructions given to me. I know to contact the clinic, my physician, or go to the Emergency Department if any problems should occur. I do not have any questions at this time, but understand that I may call the clinic during office hours   should I have any questions or need assistance in  obtaining follow up care.    __________________________________________  _____________  __________ Signature of Patient or Authorized Representative            Date                   Time    __________________________________________ Nurse's Signature   Fluorouracil, 5-FU injection What is this medicine? FLUOROURACIL, 5-FU (flure oh YOOR a sil) is a chemotherapy drug. It slows the growth of cancer cells. This medicine is used to treat many types of cancer like breast cancer, colon or rectal cancer, pancreatic cancer, and stomach cancer. This medicine may be used for other purposes; ask your health care provider or pharmacist if you have questions. What should I tell my health care provider before I take this medicine? They need to know if you have any of these conditions: -blood disorders -dihydropyrimidine dehydrogenase (DPD) deficiency -infection (especially a virus infection such as chickenpox, cold sores, or herpes) -kidney disease -liver disease -malnourished, poor nutrition -recent or ongoing radiation therapy -an unusual or allergic reaction to fluorouracil, other chemotherapy, other medicines, foods, dyes, or preservatives -pregnant or trying to get pregnant -breast-feeding How should I use this medicine? This drug is given as an infusion or injection into a vein. It is administered in a hospital or clinic by a specially trained health care professional. Talk to your pediatrician regarding the use of this medicine in children. Special care may be needed. Overdosage: If you think you have  taken too much of this medicine contact a poison control center or emergency room at once. NOTE: This medicine is only for you. Do not share this medicine with others. What if I miss a dose? It is important not to miss your dose. Call your doctor or health care professional if you are unable to keep an appointment. What may interact with this  medicine? -allopurinol -cimetidine -dapsone -digoxin -hydroxyurea -leucovorin -levamisole -medicines for seizures like ethotoin, fosphenytoin, phenytoin -medicines to increase blood counts like filgrastim, pegfilgrastim, sargramostim -medicines that treat or prevent blood clots like warfarin, enoxaparin, and dalteparin -methotrexate -metronidazole -pyrimethamine -some other chemotherapy drugs like busulfan, cisplatin, estramustine, vinblastine -trimethoprim -trimetrexate -vaccines Talk to your doctor or health care professional before taking any of these medicines: -acetaminophen -aspirin -ibuprofen -ketoprofen -naproxen This list may not describe all possible interactions. Give your health care provider a list of all the medicines, herbs, non-prescription drugs, or dietary supplements you use. Also tell them if you smoke, drink alcohol, or use illegal drugs. Some items may interact with your medicine. What should I watch for while using this medicine? Visit your doctor for checks on your progress. This drug may make you feel generally unwell. This is not uncommon, as chemotherapy can affect healthy cells as well as cancer cells. Report any side effects. Continue your course of treatment even though you feel ill unless your doctor tells you to stop. In some cases, you may be given additional medicines to help with side effects. Follow all directions for their use. Call your doctor or health care professional for advice if you get a fever, chills or sore throat, or other symptoms of a cold or flu. Do not treat yourself. This drug decreases your body's ability to fight infections. Try to avoid being around people who are sick. This medicine may increase your risk to bruise or bleed. Call your doctor or health care professional if you notice any unusual bleeding. Be careful brushing and flossing your teeth or using a toothpick because you may get an infection or bleed more easily. If you  have any dental work done, tell your dentist you are receiving this medicine. Avoid taking products that contain aspirin, acetaminophen, ibuprofen, naproxen, or ketoprofen unless instructed by your doctor. These medicines may hide a fever. Do not become pregnant while taking this medicine. Women should inform their doctor if they wish to become pregnant or think they might be pregnant. There is a potential for serious side effects to an unborn child. Talk to your health care professional or pharmacist for more information. Do not breast-feed an infant while taking this medicine. Men should inform their doctor if they wish to father a child. This medicine may lower sperm counts. Do not treat diarrhea with over the counter products. Contact your doctor if you have diarrhea that lasts more than 2 days or if it is severe and watery. This medicine can make you more sensitive to the sun. Keep out of the sun. If you cannot avoid being in the sun, wear protective clothing and use sunscreen. Do not use sun lamps or tanning beds/booths. What side effects may I notice from receiving this medicine? Side effects that you should report to your doctor or health care professional as soon as possible: -allergic reactions like skin rash, itching or hives, swelling of the face, lips, or tongue -low blood counts - this medicine may decrease the number of white blood cells, red blood cells and platelets. You may be at increased risk  for infections and bleeding. -signs of infection - fever or chills, cough, sore throat, pain or difficulty passing urine -signs of decreased platelets or bleeding - bruising, pinpoint red spots on the skin, black, tarry stools, blood in the urine -signs of decreased red blood cells - unusually weak or tired, fainting spells, lightheadedness -breathing problems -changes in vision -chest pain -mouth sores -nausea and vomiting -pain, swelling, redness at site where injected -pain, tingling,  numbness in the hands or feet -redness, swelling, or sores on hands or feet -stomach pain -unusual bleeding Side effects that usually do not require medical attention (report to your doctor or health care professional if they continue or are bothersome): -changes in finger or toe nails -diarrhea -dry or itchy skin -hair loss -headache -loss of appetite -sensitivity of eyes to the light -stomach upset -unusually teary eyes This list may not describe all possible side effects. Call your doctor for medical advice about side effects. You may report side effects to FDA at 1-800-FDA-1088. Where should I keep my medicine? This drug is given in a hospital or clinic and will not be stored at home. NOTE: This sheet is a summary. It may not cover all possible information. If you have questions about this medicine, talk to your doctor, pharmacist, or health care provider.  2012, [employer]. (08/05/2007 1:53:16 PM)Irinotecan injection What is this medicine?    IRINOTECAN (ir in oh TEE kan ) is a chemotherapy drug. It is used to treat colon and rectal cancer. This medicine may be used for other purposes; ask your health care provider or pharmacist if you have questions. What should I tell my health care provider before I take this medicine? They need to know if you have any of these conditions: -blood disorders -dehydration -diarrhea -infection (especially a virus infection such as chickenpox, cold sores, or herpes) -liver disease -low blood counts, like low white cell, platelet, or red cell counts -recent or ongoing radiation therapy -an unusual or allergic reaction to irinotecan, sorbitol, other chemotherapy, other medicines, foods, dyes, or preservatives -pregnant or trying to get pregnant -breast-feeding How should I use this medicine? This drug is given as an infusion into a vein. It is administered in a hospital or clinic by a specially trained health care  professional. Talk to your pediatrician regarding the use of this medicine in children. Special care may be needed. Overdosage: If you think you have taken too much of this medicine contact a poison control center or emergency room at once. NOTE: This medicine is only for you. Do not share this medicine with others. What if I miss a dose? It is important not to miss your dose. Call your doctor or health care professional if you are unable to keep an appointment. What may interact with this medicine? Do not take this medicine with any of the following medications: -atazanavir -ketoconazole -St. John's Wort This medicine may also interact with the following medications: -dexamethasone -diuretics -laxatives -medicines for seizures like carbamazepine, mephobarbital, phenobarbital, phenytoin, primidone -medicines to increase blood counts like filgrastim, pegfilgrastim, sargramostim -prochlorperazine -vaccines This list may not describe all possible interactions. Give your health care provider a list of all the medicines, herbs, non-prescription drugs, or dietary supplements you use. Also tell them if you smoke, drink alcohol, or use illegal drugs. Some items may interact with your medicine. What should I watch for while using this medicine? Your condition will be monitored carefully while you are receiving this medicine. You will need important blood work  done while you are taking this medicine. This drug may make you feel generally unwell. This is not uncommon, as chemotherapy can affect healthy cells as well as cancer cells. Report any side effects. Continue your course of treatment even though you feel ill unless your doctor tells you to stop. In some cases, you may be given additional medicines to help with side effects. Follow all directions for their use. You may get drowsy or dizzy. Do not drive, use machinery, or do anything that needs mental alertness until you know how this medicine  affects you. Do not stand or sit up quickly, especially if you are an older patient. This reduces the risk of dizzy or fainting spells. Call your doctor or health care professional for advice if you get a fever, chills or sore throat, or other symptoms of a cold or flu. Do not treat yourself. This drug decreases your body's ability to fight infections. Try to avoid being around people who are sick. This medicine may increase your risk to bruise or bleed. Call your doctor or health care professional if you notice any unusual bleeding. Be careful brushing and flossing your teeth or using a toothpick because you may get an infection or bleed more easily. If you have any dental work done, tell your dentist you are receiving this medicine. Avoid taking products that contain aspirin, acetaminophen, ibuprofen, naproxen, or ketoprofen unless instructed by your doctor. These medicines may hide a fever. Do not become pregnant while taking this medicine. Women should inform their doctor if they wish to become pregnant or think they might be pregnant. There is a potential for serious side effects to an unborn child. Talk to your health care professional or pharmacist for more information. Do not breast-feed an infant while taking this medicine. What side effects may I notice from receiving this medicine? Side effects that you should report to your doctor or health care professional as soon as possible: -allergic reactions like skin rash, itching or hives, swelling of the face, lips, or tongue -low blood counts - this medicine may decrease the number of white blood cells, red blood cells and platelets. You may be at increased risk for infections and bleeding. -signs of infection - fever or chills, cough, sore throat, pain or difficulty passing urine -signs of decreased platelets or bleeding - bruising, pinpoint red spots on the skin, black, tarry stools, blood in the urine -signs of decreased red blood cells -  unusually weak or tired, fainting spells, lightheadedness -breathing problems -chest pain -diarrhea -feeling faint or lightheaded, falls -flushing, runny nose, sweating during infusion -mouth sores or pain -pain, swelling, redness or irritation where injected -pain, swelling, warmth in the leg -pain, tingling, numbness in the hands or feet -problems with balance, talking, walking -stomach cramps, pain -trouble passing urine or change in the amount of urine -vomiting as to be unable to hold down drinks or food -yellowing of the eyes or skin Side effects that usually do not require medical attention (report to your doctor or health care professional if they continue or are bothersome): -constipation -hair loss -headache -loss of appetite -nausea, vomiting -stomach upset This list may not describe all possible side effects. Call your doctor for medical advice about side effects. You may report side effects to FDA at 1-800-FDA-1088. Where should I keep my medicine? This drug is given in a hospital or clinic and will not be stored at home. NOTE: This sheet is a summary. It may not cover all possible  information. If you have questions about this medicine, talk to your doctor, pharmacist, or health care provider.  2012, [employer] Standard. (08/18/2007 4:29:12 PM)

## 2012-03-23 NOTE — Progress Notes (Signed)
This office note has been dictated.

## 2012-03-24 NOTE — Progress Notes (Signed)
CC:   [name], M.D. [name], M.D. [name], M.D.  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  CURRENT THERAPY: Patient to finally start adjuvant chemotherapy today.  INTERIM HISTORY:  Brandon Schaefer comes in for a followup.  The abdominal wound is finally healed up.  It still is being packed, but there is very little exudate from this.  His sister-in-law is helping out quite a bit.  He finally had a Port-A-Cath placed.  His diabetes seems to be doing okay.  His cancer is KRAS negative.  We did get that tested.  PHYSICAL EXAMINATION:  General:  This is an obese white gentleman in no obvious distress.  Vital signs:  Temperature of 98.3, pulse 96, respiratory rate 20, blood pressure 116/50.  Weight is 427.  Head and neck:  Normocephalic, atraumatic skull.  There are no ocular or oral lesions.  There are no palpable cervical or supraclavicular lymph nodes. Lungs:  Clear bilaterally.  There may be some slight decrease at the bases.  Cardiac:  Regular rate and rhythm with a normal S1 and S2. There are no murmurs, rubs, or bruits.  Abdomen:  Laparotomy scar.  He does have some dressing over the laparotomy scar.  There is no tenderness in the abdomen.  He is morbidly obese.  There is no palpable hepatosplenomegaly.  Extremities:  Some stasis dermatitis in his lower legs.  Neurologic:  No focal neurological deficits.  LABORATORY STUDIES:  White cell count is 8.9, hemoglobin 9.5, hematocrit 32.1, platelet count is 393.  IMPRESSION:  Brandon Schaefer is a 56 year old white gentleman with stage II adenocarcinoma of the sigmoid colon.  His assay score was 36.  This was on the high side and again, would be suggestive of increased risk of recurrence.  Thankfully, his tumor is KRAS negative.  We will go and start his treatments today.  I feel that he is in good enough shape to be able to handle the treatments.  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    ______________________________ Josph Macho, M.D. PRE/MEDQ  D:  03/23/2012  T:  03/24/2012  Job:  [id number]

## 2012-03-25 ENCOUNTER — Ambulatory Visit: Payer: 59

## 2012-03-26 ENCOUNTER — Telehealth (INDEPENDENT_AMBULATORY_CARE_PROVIDER_SITE_OTHER): Payer: Self-pay

## 2012-03-26 ENCOUNTER — Encounter (INDEPENDENT_AMBULATORY_CARE_PROVIDER_SITE_OTHER): Payer: Self-pay

## 2012-03-26 ENCOUNTER — Encounter: Payer: Self-pay | Admitting: Hematology & Oncology

## 2012-03-26 NOTE — Telephone Encounter (Signed)
The sister in law called to request a work note for going back to work on 12/16.  I typed a note and faxed it to [phone].

## 2012-03-30 ENCOUNTER — Ambulatory Visit: Payer: 59

## 2012-03-30 ENCOUNTER — Telehealth: Payer: Self-pay | Admitting: *Deleted

## 2012-03-30 ENCOUNTER — Other Ambulatory Visit: Payer: 59 | Admitting: Lab

## 2012-03-30 ENCOUNTER — Ambulatory Visit: Payer: 59 | Admitting: Medical

## 2012-03-30 DIAGNOSIS — T451X5A Adverse effect of antineoplastic and immunosuppressive drugs, initial encounter: Secondary | ICD-10-CM

## 2012-03-30 DIAGNOSIS — C187 Malignant neoplasm of sigmoid colon: Secondary | ICD-10-CM

## 2012-03-30 DIAGNOSIS — K521 Toxic gastroenteritis and colitis: Secondary | ICD-10-CM

## 2012-03-30 MED ORDER — DIPHENOXYLATE-ATROPINE 2.5-0.025 MG PO TABS: ORAL_TABLET | ORAL | Status: DC

## 2012-03-30 NOTE — Telephone Encounter (Signed)
Pt called with c/o diarrhea since last week (7-8 per day). He stated that the Imodium isn't working. Confirmed that he is taking correctly. Denies feeling dehydrated. Reviewed with Dr Myna Hidalgo. To start Lomotil 1-2 tabs after each loose stool (no limit on the # of pills per day). Pt made aware of the instructions and knows to call the office if the Lomotil doesn't help or if he feels as though he is becoming dehydrated.

## 2012-04-06 ENCOUNTER — Ambulatory Visit: Payer: Self-pay | Admitting: Medical

## 2012-04-06 ENCOUNTER — Ambulatory Visit: Payer: Self-pay

## 2012-04-06 ENCOUNTER — Other Ambulatory Visit: Payer: Self-pay | Admitting: Lab

## 2012-04-14 ENCOUNTER — Ambulatory Visit (HOSPITAL_BASED_OUTPATIENT_CLINIC_OR_DEPARTMENT_OTHER): Payer: 59 | Admitting: Medical

## 2012-04-14 ENCOUNTER — Telehealth: Payer: Self-pay | Admitting: Hematology & Oncology

## 2012-04-14 ENCOUNTER — Other Ambulatory Visit (HOSPITAL_BASED_OUTPATIENT_CLINIC_OR_DEPARTMENT_OTHER): Payer: 59 | Admitting: Lab

## 2012-04-14 ENCOUNTER — Ambulatory Visit: Payer: Self-pay

## 2012-04-14 VITALS — BP 124/50 | HR 107 | Temp 98.5°F | Resp 22 | Ht 72.0 in | Wt >= 6400 oz

## 2012-04-14 DIAGNOSIS — C187 Malignant neoplasm of sigmoid colon: Secondary | ICD-10-CM

## 2012-04-14 DIAGNOSIS — M7989 Other specified soft tissue disorders: Secondary | ICD-10-CM

## 2012-04-14 DIAGNOSIS — R197 Diarrhea, unspecified: Secondary | ICD-10-CM

## 2012-04-14 LAB — CBC WITH DIFFERENTIAL (CANCER CENTER ONLY)
BASO#: 0.1 10*3/uL (ref 0.0–0.2)
EOS%: 0.8 % (ref 0.0–7.0)
Eosinophils Absolute: 0.1 10*3/uL (ref 0.0–0.5)
HCT: 31.4 % — ABNORMAL LOW (ref 38.7–49.9)
HGB: 9.2 g/dL — ABNORMAL LOW (ref 13.0–17.1)
MCH: 21.6 pg — ABNORMAL LOW (ref 28.0–33.4)
MCHC: 29.3 g/dL — ABNORMAL LOW (ref 32.0–35.9)
MCV: 74 fL — ABNORMAL LOW (ref 82–98)
MONO%: 7.8 % (ref 0.0–13.0)
NEUT%: 76.5 % (ref 40.0–80.0)
RBC: 4.25 10*6/uL (ref 4.20–5.70)

## 2012-04-14 LAB — COMPREHENSIVE METABOLIC PANEL
AST: 14 U/L (ref 0–37)
Albumin: 3.6 g/dL (ref 3.5–5.2)
Alkaline Phosphatase: 90 U/L (ref 39–117)
BUN: 11 mg/dL (ref 6–23)
Calcium: 8.8 mg/dL (ref 8.4–10.5)
Creatinine, Ser: 0.61 mg/dL (ref 0.50–1.35)
Glucose, Bld: 148 mg/dL — ABNORMAL HIGH (ref 70–99)
Potassium: 4.4 mEq/L (ref 3.5–5.3)

## 2012-04-14 MED ORDER — CHOLESTYRAMINE 4 G PO PACK: 1.0000 | PACK | Freq: Three times a day (TID) | ORAL | Status: DC

## 2012-04-14 NOTE — Progress Notes (Signed)
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  Brandon Schaefer presents today for an office followup visit.  Overall, he, reports, that he's not doing relatively well.  He did complete his first cycle of  FOLFIRI.  He, reports, significant diarrhea, since December 16.  He is on Lomotil and Imodium, now, without any resolution.  He, also reports, extreme fatigue.  He still continues to work, but mainly sits down.  He, reports, that he has a good appetite.  He is able to eat and he is treating plenty of fluids.  He does not feel dehydrated.  She reports, the diarrhea is anywhere from 8-10 times a day.  He's not reporting any nausea, vomiting, cough, chest pain, or shortness of breath.  He does have some dyspnea on exertion.  He denies any fevers, chills, or night sweats.  He denies any headaches, visual changes, or rashes.  He denies any mucositis.  He does have some peripheral neuropathy, however, this is from his diabetes.  This has not worsened.  He does have bilateral lower extremity pitting edema, which is new.  I did advise him to get some support stockings and to let us know if this helped.  If not we can give him a mild diuretic.  I really feel secondary to the, grade 3 diarrhea, that we need to hold off on his chemotherapy for one week.  I'm going to call in some Questran for him.  Hopefully, this will help.  We will have him return on January 7 for lab work, and then to start his second cycle of chemotherapy.  I also informed him to move around as much as he can instead of being sedentary.  He is at high risk for thromboembolic event and does like to avoid that as much as possible.   Review of Systems: Constitutional:Negative for malaise/fatigue, fever, chills, weight loss, diaphoresis, activity change, appetite change, and unexpected weight change.  HEENT: Negative for double vision, blurred  vision, visual loss, ear pain, tinnitus, congestion, rhinorrhea, epistaxis sore throat or sinus disease, oral pain/lesion, tongue soreness Respiratory: Negative for cough, chest tightness, shortness of breath, wheezing and stridor.  Cardiovascular: Negative for chest pain, palpitations, leg swelling, orthopnea, PND, DOE or claudication Gastrointestinal: Negative for nausea, vomiting, abdominal pain, diarrhea, constipation, blood in stool, melena, hematochezia, abdominal distention, anal bleeding, rectal pain, anorexia and hematemesis.  Genitourinary: Negative for dysuria, frequency, hematuria,  Musculoskeletal: Negative for myalgias, back pain, joint swelling, arthralgias and gait problem.  Skin: Negative for rash, color change, pallor and wound.  Neurological:. Negative for dizziness/light-headedness, tremors, seizures, syncope, facial asymmetry, speech difficulty, weakness, numbness, headaches and paresthesias.  Hematological: Negative for adenopathy. Does not bruise/bleed easily.  Psychiatric/Behavioral:  Negative for depression, no loss of interest in normal activity or change in sleep pattern.   Physical Exam: This is a 56 year old, obese, white gentleman, in no obvious distress Vitals: temperature 90.5 degrees, pulse 107, respiration 22 blood pressure 104/50 weight 430 pounds  HEENT reveals a normocephalic, atraumatic skull, no scleral icterus, no oral lesions  Neck is supple without any cervical or supraclavicular adenopathy.  Lungs are clear to auscultation bilaterally. There are no wheezes, rales or rhonci Cardiac is regular rate and rhythm with a normal S1 and S2. There are no murmurs, rubs, or bruits.  Abdomen is soft with good bowel sounds, there is no palpable mass. There is no palpable hepatosplenomegaly. There is  no palpable fluid wave.  Musculoskeletal no tenderness of the spine, ribs, or hips.  Extremities there are no clubbing, cyanosis, or edema.  Skin no petechia, purpura or  ecchymosis Neurologic is nonfocal.  Laboratory Data:  white count 13.3, hemoglobin 9.2, hematocrit 31.4, platelets 556,000   Current Outpatient Prescriptions on File Prior to Visit  Medication Sig Dispense Refill  . carvedilol (COREG) 6.25 MG tablet Take 6.25 mg by mouth 2 (two) times daily with a meal.      . diphenoxylate-atropine (LOMOTIL) 2.5-0.025 MG per tablet Take 1-2 tabs after every loose stool.  100 tablet  1  . fluticasone (FLONASE) 50 MCG/ACT nasal spray Place 2 sprays into the nose daily.      [name] gabapentin (NEURONTIN) 400 MG capsule Take 800 mg by mouth 3 (three) times daily.      [name] HYDROcodone-acetaminophen (VICODIN) 5-500 MG per tablet Take 1 tablet by mouth every 6 (six) hours as needed. Pain      . lidocaine-prilocaine (EMLA) cream Apply topically as needed. Apply to portacath site at least one hour prior to chemotherapy appointment  30 g  3  . LORazepam (ATIVAN) 1 MG tablet Take 1 tablet (1 mg total) by mouth every 8 (eight) hours as needed for anxiety. Or for nausea and vomiting  30 tablet  3  . Multiple Vitamin (MULTIVITAMIN WITH MINERALS) TABS Take 1 tablet by mouth daily.      . ondansetron (ZOFRAN) 8 MG tablet Take 1 tablet (8 mg total) by mouth 2 (two) times daily. Take one tablet twice daily beginning day after chemotherapy for 3 days then every 8 hours  as needed for nausea  20 tablet  3  . oxyCODONE-acetaminophen (PERCOCET) 7.5-325 MG per tablet Take 1 tablet by mouth every 4 (four) hours as needed for pain.  80 tablet  0  . pioglitazone-metformin (ACTOPLUS MET) 15-850 MG per tablet Take 1 tablet by mouth 3 (three) times daily.      . prochlorperazine (COMPAZINE) 10 MG tablet Take 1 tablet (10 mg total) by mouth every 6 (six) hours as needed. For nausea and vomiting  30 tablet  2  . ramipril (ALTACE) 10 MG capsule Take 10 mg by mouth daily before breakfast.      . rosuvastatin (CRESTOR) 20 MG tablet Take 20 mg by mouth daily before breakfast.      . sertraline  (ZOLOFT) 100 MG tablet Take 150 mg by mouth daily before breakfast.      . TRADJENTA 5 MG TABS tablet Take by mouth daily.        Assessment/Plan: This is a pleasant, 56 year old, white male with the following issues:  #1.  Stage II adenocarcinoma of the sigmoid colon.  The patient did have a sigmoid colectomy.  His Assay score was 36.  This is on the high side and again, would be suggestive of increased risk of recurrence.  Thankfully, his tumor is K-ras negative.  Unfortunately, he is having some toxicity in the form of diarrhea, related to the chemotherapy.  We will go ahead and postpone his chemotherapy by one week.  He will come back on January 7 for lab work and to start his second cycle of FOLFIRI.  #2.  Diarrhea.  This is secondary to #1.  He is on Lomotil and Imodium without any resolution.  I'm going to go ahead and call him in [place].  Hopefully, this will help.  I advised him to continue to drink plenty of fluids.  We  are delaying his chemotherapy by one week.  #3.  Lower extremity swelling.  I did advise him to elevate his legs when he can.  I also advised him to get some support stockings, which should make a significant difference in the swelling.  If this does not work we may need to go with a mild diuretic.  #4.  Followup.  Mr. Haywood will follow back up with [place] in the next few weeks, but before then should there be questions or concerns.

## 2012-04-14 NOTE — Telephone Encounter (Signed)
Due to blood work pt's chemo cx for today moved to 1-7 and all appointments are moved out one week. Talked with Britta Mccreedy at [facility] because pt had post op appointment for 04-21-12. They didn't have anything open till 1-20. Caregiver stated pt has open wound with discharge and needs to be seen sooner than that. [facility] is aware of this and said they would talk to the Doctor after the holiday's. Caregiver is aware and will call Friday if they haven't called them she has pt schedule . French Ana PA is aware of situation.

## 2012-04-14 NOTE — Progress Notes (Signed)
Patient not going to receive chemo treatment today per Eunice Blase PA

## 2012-04-16 ENCOUNTER — Ambulatory Visit: Payer: 59

## 2012-04-16 ENCOUNTER — Other Ambulatory Visit: Payer: 59 | Admitting: Lab

## 2012-04-16 ENCOUNTER — Encounter: Payer: Self-pay | Admitting: Pharmacist

## 2012-04-16 ENCOUNTER — Ambulatory Visit: Payer: 59 | Admitting: Hematology & Oncology

## 2012-04-21 ENCOUNTER — Encounter (INDEPENDENT_AMBULATORY_CARE_PROVIDER_SITE_OTHER): Payer: 59 | Admitting: Surgery

## 2012-04-21 ENCOUNTER — Other Ambulatory Visit (HOSPITAL_BASED_OUTPATIENT_CLINIC_OR_DEPARTMENT_OTHER): Payer: 59 | Admitting: Lab

## 2012-04-21 ENCOUNTER — Ambulatory Visit (HOSPITAL_BASED_OUTPATIENT_CLINIC_OR_DEPARTMENT_OTHER): Payer: 59

## 2012-04-21 ENCOUNTER — Other Ambulatory Visit: Payer: 59 | Admitting: Lab

## 2012-04-21 ENCOUNTER — Ambulatory Visit: Payer: 59

## 2012-04-21 ENCOUNTER — Ambulatory Visit: Payer: 59 | Admitting: Medical

## 2012-04-21 VITALS — BP 121/64 | HR 103 | Temp 97.0°F | Resp 20

## 2012-04-21 DIAGNOSIS — Z5111 Encounter for antineoplastic chemotherapy: Secondary | ICD-10-CM

## 2012-04-21 DIAGNOSIS — C187 Malignant neoplasm of sigmoid colon: Secondary | ICD-10-CM

## 2012-04-21 LAB — COMPREHENSIVE METABOLIC PANEL
Albumin: 3.8 g/dL (ref 3.5–5.2)
BUN: 12 mg/dL (ref 6–23)
CO2: 28 mEq/L (ref 19–32)
Calcium: 9 mg/dL (ref 8.4–10.5)
Chloride: 104 mEq/L (ref 96–112)
Glucose, Bld: 147 mg/dL — ABNORMAL HIGH (ref 70–99)
Potassium: 4.1 mEq/L (ref 3.5–5.3)
Sodium: 142 mEq/L (ref 135–145)
Total Protein: 6.8 g/dL (ref 6.0–8.3)

## 2012-04-21 LAB — CBC WITH DIFFERENTIAL (CANCER CENTER ONLY)
BASO#: 0.1 10*3/uL (ref 0.0–0.2)
Eosinophils Absolute: 0.1 10*3/uL (ref 0.0–0.5)
HGB: 9.8 g/dL — ABNORMAL LOW (ref 13.0–17.1)
MCH: 21.9 pg — ABNORMAL LOW (ref 28.0–33.4)
MCV: 74 fL — ABNORMAL LOW (ref 82–98)
MONO#: 0.8 10*3/uL (ref 0.1–0.9)
NEUT#: 10.2 10*3/uL — ABNORMAL HIGH (ref 1.5–6.5)
Platelets: 399 10*3/uL (ref 145–400)
RBC: 4.47 10*6/uL (ref 4.20–5.70)
WBC: 12.8 10*3/uL — ABNORMAL HIGH (ref 4.0–10.0)

## 2012-04-21 LAB — CEA: CEA: 1.2 ng/mL (ref 0.0–5.0)

## 2012-04-21 MED ORDER — LEUCOVORIN CALCIUM INJECTION 350 MG
400.0000 mg/m2 | Freq: Once | INTRAVENOUS | Status: AC
  Administered 2012-04-21: 1232 mg via INTRAVENOUS
  Filled 2012-04-21: qty 61.6

## 2012-04-21 MED ORDER — ATROPINE SULFATE 1 MG/ML IJ SOLN
0.5000 mg | Freq: Once | INTRAMUSCULAR | Status: AC | PRN
  Administered 2012-04-21: 0.5 mg via INTRAVENOUS

## 2012-04-21 MED ORDER — HEPARIN SOD (PORK) LOCK FLUSH 100 UNIT/ML IV SOLN
500.0000 [IU] | Freq: Once | INTRAVENOUS | Status: DC | PRN
  Filled 2012-04-21: qty 5

## 2012-04-21 MED ORDER — FLUOROURACIL CHEMO INJECTION 2.5 GM/50ML
400.0000 mg/m2 | Freq: Once | INTRAVENOUS | Status: AC
  Administered 2012-04-21: 1250 mg via INTRAVENOUS
  Filled 2012-04-21: qty 25

## 2012-04-21 MED ORDER — ONDANSETRON 16 MG/50ML IVPB (CHCC)
16.0000 mg | Freq: Once | INTRAVENOUS | Status: AC
  Administered 2012-04-21: 16 mg via INTRAVENOUS

## 2012-04-21 MED ORDER — SODIUM CHLORIDE 0.9 % IV SOLN
1920.0000 mg/m2 | INTRAVENOUS | Status: DC
  Administered 2012-04-21: 5900 mg via INTRAVENOUS
  Filled 2012-04-21: qty 118

## 2012-04-21 MED ORDER — SODIUM CHLORIDE 0.9 % IV SOLN
Freq: Once | INTRAVENOUS | Status: AC
  Administered 2012-04-21: 11:00:00 via INTRAVENOUS

## 2012-04-21 MED ORDER — DEXAMETHASONE SODIUM PHOSPHATE 10 MG/ML IJ SOLN
20.0000 mg | Freq: Once | INTRAMUSCULAR | Status: AC
  Administered 2012-04-21: 20 mg via INTRAVENOUS

## 2012-04-21 MED ORDER — IRINOTECAN HCL CHEMO INJECTION 100 MG/5ML
144.0000 mg/m2 | Freq: Once | INTRAVENOUS | Status: AC
  Administered 2012-04-21: 444 mg via INTRAVENOUS
  Filled 2012-04-21: qty 22.2

## 2012-04-21 MED ORDER — SODIUM CHLORIDE 0.9 % IJ SOLN
10.0000 mL | INTRAMUSCULAR | Status: DC | PRN
  Filled 2012-04-21: qty 10

## 2012-04-21 NOTE — Patient Instructions (Signed)
[facility] Discharge Instructions for Patients Receiving Chemotherapy  Today you received the following chemotherapy agents 5FU and Camptosar  To help prevent nausea and vomiting after your treatment, we encourage you to take your nausea medication   1)Zofran 8mg  twice daily Days 2,3,4 post chemo, Begin taking 03/24/12 and Take 03/25/12 and 03/26/12.  Take twice daily to prevent nausea.  Can also take this every 8 hours as needed if Nausea continues to be a problem  2)Compazine 10 mg by mouth as needed for nausea  3) Ativan 1 mg by mouth every 8 hours as needed for nausea, vomiting or anxiety   If you develop nausea and vomiting that is not controlled by your nausea medication, call our office at [phone].   If it is after clinic hours your family physician or the after hours number for the clinic or go to the Emergency Department.   BELOW ARE SYMPTOMS THAT SHOULD BE REPORTED IMMEDIATELY:  *FEVER GREATER THAN 100.5 F  *CHILLS WITH OR WITHOUT FEVER  NAUSEA AND VOMITING THAT IS NOT CONTROLLED WITH YOUR NAUSEA MEDICATION  *UNUSUAL SHORTNESS OF BREATH  *UNUSUAL BRUISING OR BLEEDING  TENDERNESS IN MOUTH AND THROAT WITH OR WITHOUT PRESENCE OF ULCERS  *URINARY PROBLEMS  *BOWEL PROBLEMS  UNUSUAL RASH Items with * indicate a potential emergency and should be followed up as soon as possible.  One of the nurses will contact you 24 hours after your treatment. Please let the nurse know about any problems that you may have experienced. Feel free to call the clinic you have any questions or concerns. The clinic phone number is [phone].   I have been informed and understand all the instructions given to me. I know to contact the clinic, my physician, or go to the Emergency Department if any problems should occur. I do not have any questions at this time, but understand that I may call the clinic during office hours   should I have any questions or need assistance in  obtaining follow up care.    __________________________________________  _____________  __________ Signature of Patient or Authorized Representative            Date                   Time    __________________________________________ Nurse's Signature   Fluorouracil, 5-FU injection What is this medicine? FLUOROURACIL, 5-FU (flure oh YOOR a sil) is a chemotherapy drug. It slows the growth of cancer cells. This medicine is used to treat many types of cancer like breast cancer, colon or rectal cancer, pancreatic cancer, and stomach cancer. This medicine may be used for other purposes; ask your health care provider or pharmacist if you have questions. What should I tell my health care provider before I take this medicine? They need to know if you have any of these conditions: -blood disorders -dihydropyrimidine dehydrogenase (DPD) deficiency -infection (especially a virus infection such as chickenpox, cold sores, or herpes) -kidney disease -liver disease -malnourished, poor nutrition -recent or ongoing radiation therapy -an unusual or allergic reaction to fluorouracil, other chemotherapy, other medicines, foods, dyes, or preservatives -pregnant or trying to get pregnant -breast-feeding How should I use this medicine? This drug is given as an infusion or injection into a vein. It is administered in a hospital or clinic by a specially trained health care professional. Talk to your pediatrician regarding the use of this medicine in children. Special care may be needed. Overdosage: If you think you have  taken too much of this medicine contact a poison control center or emergency room at once. NOTE: This medicine is only for you. Do not share this medicine with others. What if I miss a dose? It is important not to miss your dose. Call your doctor or health care professional if you are unable to keep an appointment. What may interact with this  medicine? -allopurinol -cimetidine -dapsone -digoxin -hydroxyurea -leucovorin -levamisole -medicines for seizures like ethotoin, fosphenytoin, phenytoin -medicines to increase blood counts like filgrastim, pegfilgrastim, sargramostim -medicines that treat or prevent blood clots like warfarin, enoxaparin, and dalteparin -methotrexate -metronidazole -pyrimethamine -some other chemotherapy drugs like busulfan, cisplatin, estramustine, vinblastine -trimethoprim -trimetrexate -vaccines Talk to your doctor or health care professional before taking any of these medicines: -acetaminophen -aspirin -ibuprofen -ketoprofen -naproxen This list may not describe all possible interactions. Give your health care provider a list of all the medicines, herbs, non-prescription drugs, or dietary supplements you use. Also tell them if you smoke, drink alcohol, or use illegal drugs. Some items may interact with your medicine. What should I watch for while using this medicine? Visit your doctor for checks on your progress. This drug may make you feel generally unwell. This is not uncommon, as chemotherapy can affect healthy cells as well as cancer cells. Report any side effects. Continue your course of treatment even though you feel ill unless your doctor tells you to stop. In some cases, you may be given additional medicines to help with side effects. Follow all directions for their use. Call your doctor or health care professional for advice if you get a fever, chills or sore throat, or other symptoms of a cold or flu. Do not treat yourself. This drug decreases your body's ability to fight infections. Try to avoid being around people who are sick. This medicine may increase your risk to bruise or bleed. Call your doctor or health care professional if you notice any unusual bleeding. Be careful brushing and flossing your teeth or using a toothpick because you may get an infection or bleed more easily. If you  have any dental work done, tell your dentist you are receiving this medicine. Avoid taking products that contain aspirin, acetaminophen, ibuprofen, naproxen, or ketoprofen unless instructed by your doctor. These medicines may hide a fever. Do not become pregnant while taking this medicine. Women should inform their doctor if they wish to become pregnant or think they might be pregnant. There is a potential for serious side effects to an unborn child. Talk to your health care professional or pharmacist for more information. Do not breast-feed an infant while taking this medicine. Men should inform their doctor if they wish to father a child. This medicine may lower sperm counts. Do not treat diarrhea with over the counter products. Contact your doctor if you have diarrhea that lasts more than 2 days or if it is severe and watery. This medicine can make you more sensitive to the sun. Keep out of the sun. If you cannot avoid being in the sun, wear protective clothing and use sunscreen. Do not use sun lamps or tanning beds/booths. What side effects may I notice from receiving this medicine? Side effects that you should report to your doctor or health care professional as soon as possible: -allergic reactions like skin rash, itching or hives, swelling of the face, lips, or tongue -low blood counts - this medicine may decrease the number of white blood cells, red blood cells and platelets. You may be at increased risk  for infections and bleeding. -signs of infection - fever or chills, cough, sore throat, pain or difficulty passing urine -signs of decreased platelets or bleeding - bruising, pinpoint red spots on the skin, black, tarry stools, blood in the urine -signs of decreased red blood cells - unusually weak or tired, fainting spells, lightheadedness -breathing problems -changes in vision -chest pain -mouth sores -nausea and vomiting -pain, swelling, redness at site where injected -pain, tingling,  numbness in the hands or feet -redness, swelling, or sores on hands or feet -stomach pain -unusual bleeding Side effects that usually do not require medical attention (report to your doctor or health care professional if they continue or are bothersome): -changes in finger or toe nails -diarrhea -dry or itchy skin -hair loss -headache -loss of appetite -sensitivity of eyes to the light -stomach upset -unusually teary eyes This list may not describe all possible side effects. Call your doctor for medical advice about side effects. You may report side effects to FDA at 1-800-FDA-1088. Where should I keep my medicine? This drug is given in a hospital or clinic and will not be stored at home. NOTE: This sheet is a summary. It may not cover all possible information. If you have questions about this medicine, talk to your doctor, pharmacist, or health care provider.  2012, [employer] Standard. (08/05/2007 1:53:16 PM)Irinotecan injection What is this medicine?    IRINOTECAN (ir in oh TEE kan ) is a chemotherapy drug. It is used to treat colon and rectal cancer. This medicine may be used for other purposes; ask your health care provider or pharmacist if you have questions. What should I tell my health care provider before I take this medicine? They need to know if you have any of these conditions: -blood disorders -dehydration -diarrhea -infection (especially a virus infection such as chickenpox, cold sores, or herpes) -liver disease -low blood counts, like low white cell, platelet, or red cell counts -recent or ongoing radiation therapy -an unusual or allergic reaction to irinotecan, sorbitol, other chemotherapy, other medicines, foods, dyes, or preservatives -pregnant or trying to get pregnant -breast-feeding How should I use this medicine? This drug is given as an infusion into a vein. It is administered in a hospital or clinic by a specially trained health care  professional. Talk to your pediatrician regarding the use of this medicine in children. Special care may be needed. Overdosage: If you think you have taken too much of this medicine contact a poison control center or emergency room at once. NOTE: This medicine is only for you. Do not share this medicine with others. What if I miss a dose? It is important not to miss your dose. Call your doctor or health care professional if you are unable to keep an appointment. What may interact with this medicine? Do not take this medicine with any of the following medications: -atazanavir -ketoconazole -St. John's Wort This medicine may also interact with the following medications: -dexamethasone -diuretics -laxatives -medicines for seizures like carbamazepine, mephobarbital, phenobarbital, phenytoin, primidone -medicines to increase blood counts like filgrastim, pegfilgrastim, sargramostim -prochlorperazine -vaccines This list may not describe all possible interactions. Give your health care provider a list of all the medicines, herbs, non-prescription drugs, or dietary supplements you use. Also tell them if you smoke, drink alcohol, or use illegal drugs. Some items may interact with your medicine. What should I watch for while using this medicine? Your condition will be monitored carefully while you are receiving this medicine. You will need important blood work  done while you are taking this medicine. This drug may make you feel generally unwell. This is not uncommon, as chemotherapy can affect healthy cells as well as cancer cells. Report any side effects. Continue your course of treatment even though you feel ill unless your doctor tells you to stop. In some cases, you may be given additional medicines to help with side effects. Follow all directions for their use. You may get drowsy or dizzy. Do not drive, use machinery, or do anything that needs mental alertness until you know how this medicine  affects you. Do not stand or sit up quickly, especially if you are an older patient. This reduces the risk of dizzy or fainting spells. Call your doctor or health care professional for advice if you get a fever, chills or sore throat, or other symptoms of a cold or flu. Do not treat yourself. This drug decreases your body's ability to fight infections. Try to avoid being around people who are sick. This medicine may increase your risk to bruise or bleed. Call your doctor or health care professional if you notice any unusual bleeding. Be careful brushing and flossing your teeth or using a toothpick because you may get an infection or bleed more easily. If you have any dental work done, tell your dentist you are receiving this medicine. Avoid taking products that contain aspirin, acetaminophen, ibuprofen, naproxen, or ketoprofen unless instructed by your doctor. These medicines may hide a fever. Do not become pregnant while taking this medicine. Women should inform their doctor if they wish to become pregnant or think they might be pregnant. There is a potential for serious side effects to an unborn child. Talk to your health care professional or pharmacist for more information. Do not breast-feed an infant while taking this medicine. What side effects may I notice from receiving this medicine? Side effects that you should report to your doctor or health care professional as soon as possible: -allergic reactions like skin rash, itching or hives, swelling of the face, lips, or tongue -low blood counts - this medicine may decrease the number of white blood cells, red blood cells and platelets. You may be at increased risk for infections and bleeding. -signs of infection - fever or chills, cough, sore throat, pain or difficulty passing urine -signs of decreased platelets or bleeding - bruising, pinpoint red spots on the skin, black, tarry stools, blood in the urine -signs of decreased red blood cells -  unusually weak or tired, fainting spells, lightheadedness -breathing problems -chest pain -diarrhea -feeling faint or lightheaded, falls -flushing, runny nose, sweating during infusion -mouth sores or pain -pain, swelling, redness or irritation where injected -pain, swelling, warmth in the leg -pain, tingling, numbness in the hands or feet -problems with balance, talking, walking -stomach cramps, pain -trouble passing urine or change in the amount of urine -vomiting as to be unable to hold down drinks or food -yellowing of the eyes or skin Side effects that usually do not require medical attention (report to your doctor or health care professional if they continue or are bothersome): -constipation -hair loss -headache -loss of appetite -nausea, vomiting -stomach upset This list may not describe all possible side effects. Call your doctor for medical advice about side effects. You may report side effects to FDA at 1-800-FDA-1088. Where should I keep my medicine? This drug is given in a hospital or clinic and will not be stored at home. NOTE: This sheet is a summary. It may not cover all possible  information. If you have questions about this medicine, talk to your doctor, pharmacist, or health care provider.  2012, Elsevier/Gold Standard. (08/18/2007 4:29:12 PM)   Leucovorin injection What is this medicine? LEUCOVORIN (loo koe VOR in) is used to prevent or treat the harmful effects of some medicines. This medicine is used to treat anemia caused by a low amount of folic acid in the body. It is also used with 5-fluorouracil (5-FU) to treat colon cancer. This medicine may be used for other purposes; ask your health care provider or pharmacist if you have questions. What should I tell my health care provider before I take this medicine? They need to know if you have any of these conditions: -anemia from low levels of vitamin B-12 in the blood -an unusual or allergic reaction to  leucovorin, folic acid, other medicines, foods, dyes, or preservatives -pregnant or trying to get pregnant -breast-feeding How should I use this medicine? This medicine is for injection into a muscle or into a vein. It is given by a health care professional in a hospital or clinic setting. Talk to your pediatrician regarding the use of this medicine in children. Special care may be needed. Overdosage: If you think you have taken too much of this medicine contact a poison control center or emergency room at once. NOTE: This medicine is only for you. Do not share this medicine with others. What if I miss a dose? This does not apply. What may interact with this medicine? -capecitabine -fluorouracil -phenobarbital -phenytoin -primidone -trimethoprim-sulfamethoxazole This list may not describe all possible interactions. Give your health care provider a list of all the medicines, herbs, non-prescription drugs, or dietary supplements you use. Also tell them if you smoke, drink alcohol, or use illegal drugs. Some items may interact with your medicine. What should I watch for while using this medicine? Your condition will be monitored carefully while you are receiving this medicine. This medicine may increase the side effects of 5-fluorouracil, 5-FU. Tell your doctor or health care professional if you have diarrhea or mouth sores that do not get better or that get worse. What side effects may I notice from receiving this medicine? Side effects that you should report to your doctor or health care professional as soon as possible: -allergic reactions like skin rash, itching or hives, swelling of the face, lips, or tongue -breathing problems -fever, infection -mouth sores -unusual bleeding or bruising -unusually weak or tired Side effects that usually do not require medical attention (report to your doctor or health care professional if they continue or are bothersome): -constipation or  diarrhea -loss of appetite -nausea, vomiting This list may not describe all possible side effects. Call your doctor for medical advice about side effects. You may report side effects to FDA at 1-800-FDA-1088. Where should I keep my medicine? This drug is given in a hospital or clinic and will not be stored at home. NOTE: This sheet is a summary. It may not cover all possible information. If you have questions about this medicine, talk to your doctor, pharmacist, or health care provider.  2012, [employer]vier/Gold Standard. (10/06/2007 4:50:29 PM)

## 2012-04-23 ENCOUNTER — Ambulatory Visit (HOSPITAL_BASED_OUTPATIENT_CLINIC_OR_DEPARTMENT_OTHER): Payer: 59

## 2012-04-23 VITALS — BP 135/73 | HR 91 | Temp 98.1°F | Resp 20

## 2012-04-23 DIAGNOSIS — C187 Malignant neoplasm of sigmoid colon: Secondary | ICD-10-CM

## 2012-04-23 DIAGNOSIS — Z452 Encounter for adjustment and management of vascular access device: Secondary | ICD-10-CM

## 2012-04-23 MED ORDER — HEPARIN SOD (PORK) LOCK FLUSH 100 UNIT/ML IV SOLN
500.0000 [IU] | Freq: Once | INTRAVENOUS | Status: AC | PRN
  Administered 2012-04-23: 500 [IU]
  Filled 2012-04-23: qty 5

## 2012-04-23 MED ORDER — SODIUM CHLORIDE 0.9 % IJ SOLN
10.0000 mL | INTRAMUSCULAR | Status: DC | PRN
  Administered 2012-04-23: 10 mL
  Filled 2012-04-23: qty 10

## 2012-04-23 NOTE — Patient Instructions (Signed)

## 2012-04-24 ENCOUNTER — Telehealth: Payer: Self-pay | Admitting: Hematology & Oncology

## 2012-04-24 NOTE — Telephone Encounter (Signed)
Left pt message moved time of 1-21 appointment

## 2012-04-27 ENCOUNTER — Ambulatory Visit: Payer: 59 | Admitting: Hematology & Oncology

## 2012-04-27 ENCOUNTER — Other Ambulatory Visit: Payer: 59 | Admitting: Lab

## 2012-04-27 ENCOUNTER — Ambulatory Visit: Payer: 59

## 2012-05-01 ENCOUNTER — Encounter (INDEPENDENT_AMBULATORY_CARE_PROVIDER_SITE_OTHER): Payer: 59 | Admitting: General Surgery

## 2012-05-04 ENCOUNTER — Ambulatory Visit (INDEPENDENT_AMBULATORY_CARE_PROVIDER_SITE_OTHER): Payer: 59 | Admitting: Surgery

## 2012-05-04 ENCOUNTER — Encounter (INDEPENDENT_AMBULATORY_CARE_PROVIDER_SITE_OTHER): Payer: Self-pay | Admitting: Surgery

## 2012-05-04 VITALS — BP 132/87 | HR 82 | Temp 97.1°F | Resp 22 | Ht 72.0 in | Wt >= 6400 oz

## 2012-05-04 DIAGNOSIS — Z09 Encounter for follow-up examination after completed treatment for conditions other than malignant neoplasm: Secondary | ICD-10-CM

## 2012-05-04 NOTE — Progress Notes (Signed)
Subjective:     Patient ID: Brandon Schaefer, male   DOB: 03-Aug-1955, 56 y.o.   MRN: [id number]  HPI He is here because of continued drainage from the midline incision. He is undergoing active chemotherapy.  Review of Systems     Objective:   Physical Exam On exam, the wound is small but several centimeters deep. There is purulence. I treated with silver nitrate.    Assessment:     Chronic abdominal wound    Plan:     I am going to place him on doxycycline. We will continue the current wound care and they'll see him back in 2 weeks

## 2012-05-05 ENCOUNTER — Other Ambulatory Visit (HOSPITAL_BASED_OUTPATIENT_CLINIC_OR_DEPARTMENT_OTHER): Payer: 59 | Admitting: Lab

## 2012-05-05 ENCOUNTER — Ambulatory Visit: Payer: 59

## 2012-05-05 ENCOUNTER — Ambulatory Visit: Payer: 59 | Admitting: Hematology & Oncology

## 2012-05-05 ENCOUNTER — Ambulatory Visit (HOSPITAL_BASED_OUTPATIENT_CLINIC_OR_DEPARTMENT_OTHER): Payer: 59

## 2012-05-05 ENCOUNTER — Ambulatory Visit (HOSPITAL_BASED_OUTPATIENT_CLINIC_OR_DEPARTMENT_OTHER): Payer: 59 | Admitting: Hematology & Oncology

## 2012-05-05 ENCOUNTER — Other Ambulatory Visit: Payer: 59 | Admitting: Lab

## 2012-05-05 VITALS — BP 142/52 | HR 98 | Temp 97.6°F | Resp 20 | Ht 70.0 in | Wt >= 6400 oz

## 2012-05-05 DIAGNOSIS — D509 Iron deficiency anemia, unspecified: Secondary | ICD-10-CM

## 2012-05-05 DIAGNOSIS — C187 Malignant neoplasm of sigmoid colon: Secondary | ICD-10-CM

## 2012-05-05 DIAGNOSIS — Z5111 Encounter for antineoplastic chemotherapy: Secondary | ICD-10-CM

## 2012-05-05 LAB — CBC WITH DIFFERENTIAL (CANCER CENTER ONLY)
BASO#: 0 10*3/uL (ref 0.0–0.2)
BASO%: 0.5 % (ref 0.0–2.0)
EOS%: 7.5 % — ABNORMAL HIGH (ref 0.0–7.0)
HCT: 29.4 % — ABNORMAL LOW (ref 38.7–49.9)
HGB: 8.9 g/dL — ABNORMAL LOW (ref 13.0–17.1)
LYMPH#: 1.1 10*3/uL (ref 0.9–3.3)
MCHC: 30.3 g/dL — ABNORMAL LOW (ref 32.0–35.9)
MONO#: 0.2 10*3/uL (ref 0.1–0.9)
NEUT#: 2.2 10*3/uL (ref 1.5–6.5)
NEUT%: 57 % (ref 40.0–80.0)
RDW: 17.9 % — ABNORMAL HIGH (ref 11.1–15.7)
WBC: 3.9 10*3/uL — ABNORMAL LOW (ref 4.0–10.0)

## 2012-05-05 LAB — COMPREHENSIVE METABOLIC PANEL
ALT: 10 U/L (ref 0–53)
AST: 12 U/L (ref 0–37)
Albumin: 3.6 g/dL (ref 3.5–5.2)
CO2: 27 mEq/L (ref 19–32)
Calcium: 8.9 mg/dL (ref 8.4–10.5)
Chloride: 105 mEq/L (ref 96–112)
Potassium: 3.4 mEq/L — ABNORMAL LOW (ref 3.5–5.3)

## 2012-05-05 MED ORDER — LEUCOVORIN CALCIUM INJECTION 350 MG
400.0000 mg/m2 | Freq: Once | INTRAVENOUS | Status: AC
  Administered 2012-05-05: 1232 mg via INTRAVENOUS
  Filled 2012-05-05: qty 61.6

## 2012-05-05 MED ORDER — DEXAMETHASONE SODIUM PHOSPHATE 4 MG/ML IJ SOLN
20.0000 mg | Freq: Once | INTRAMUSCULAR | Status: AC
  Administered 2012-05-05: 20 mg via INTRAVENOUS

## 2012-05-05 MED ORDER — IRINOTECAN HCL CHEMO INJECTION 100 MG/5ML
144.0000 mg/m2 | Freq: Once | INTRAVENOUS | Status: AC
  Administered 2012-05-05: 444 mg via INTRAVENOUS
  Filled 2012-05-05: qty 22.2

## 2012-05-05 MED ORDER — SODIUM CHLORIDE 0.9 % IJ SOLN
10.0000 mL | INTRAMUSCULAR | Status: DC | PRN
  Filled 2012-05-05: qty 10

## 2012-05-05 MED ORDER — HEPARIN SOD (PORK) LOCK FLUSH 100 UNIT/ML IV SOLN
500.0000 [IU] | Freq: Once | INTRAVENOUS | Status: DC | PRN
  Filled 2012-05-05: qty 5

## 2012-05-05 MED ORDER — ONDANSETRON 16 MG/50ML IVPB (CHCC)
16.0000 mg | Freq: Once | INTRAVENOUS | Status: AC
  Administered 2012-05-05: 16 mg via INTRAVENOUS

## 2012-05-05 MED ORDER — SODIUM CHLORIDE 0.9 % IV SOLN
1920.0000 mg/m2 | INTRAVENOUS | Status: DC
  Administered 2012-05-05 (×2): 5900 mg via INTRAVENOUS
  Filled 2012-05-05: qty 118

## 2012-05-05 MED ORDER — FLUOROURACIL CHEMO INJECTION 2.5 GM/50ML
400.0000 mg/m2 | Freq: Once | INTRAVENOUS | Status: AC
  Administered 2012-05-05: 1250 mg via INTRAVENOUS
  Filled 2012-05-05: qty 25

## 2012-05-05 MED ORDER — SODIUM CHLORIDE 0.9 % IV SOLN
Freq: Once | INTRAVENOUS | Status: AC
  Administered 2012-05-05: 10:00:00 via INTRAVENOUS

## 2012-05-05 MED ORDER — SODIUM CHLORIDE 0.9 % IV SOLN
1020.0000 mg | Freq: Once | INTRAVENOUS | Status: AC
  Administered 2012-05-05: 1020 mg via INTRAVENOUS
  Filled 2012-05-05: qty 34

## 2012-05-05 NOTE — Progress Notes (Signed)
[employer]prox. 50 minutes after pt discharged to home the pt called and said "his cat had chewed through the chemo IV tubing and it's leaking".  Instructed to turn pump off, put the bag of chemo into a zip lock bag and come back to clinic.  Pt came back in at 1650.  New bag and tubing of chemo restarted.

## 2012-05-05 NOTE — Progress Notes (Signed)
This office note has been dictated.

## 2012-05-05 NOTE — Progress Notes (Signed)
CC:   [name], M.D. [name], M.D. [name], M.D.  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  CURRENT THERAPY:  The patient status post 2 cycles of FOLFIRI.  INTERIM HISTORY:  Mr. Gautreau comes in for his followup.  He did better with the last cycle of chemotherapy.  I decreased his dose a little bit. He is quite large.  I felt that his dosage adjustment would make it a little more tolerable for him.  His diarrhea is much better.  I told him to try a little Kaopectate for the diarrhea.  His abdominal wound still has not healed up.  He saw Dr. Charline Bills think Magnus Ivan yesterday.  Dr. Magnus Ivan put him on some doxycycline.  He is eating okay.  His blood sugars are doing all right.  He is not walking too well.  His iron studies are on the low side.  As such, I will give him some IV iron today.  I think this will help him out.  He has had no fever, sweats or chills.  There have been no mouth sores.  PHYSICAL EXAMINATION:  General:  This is a morbidly obese white gentleman in no obvious distress.  Vital signs:  Show temperature of 97.6, pulse 98, respiratory rate 20, blood pressure 142/52.  Weight is 409.  Head and neck:  Shows a normocephalic, atraumatic skull.  There are no ocular or oral lesions.  There are no palpable cervical or supraclavicular lymph nodes.  Lungs:  Clear bilaterally.  He may have some slight decrease at the bases.  Cardiac:  Regular rate and rhythm with a normal S1 and S2.  There are no murmurs, rubs or bruits. Abdomen:  Soft with good bowel sounds.  There is no palpable abdominal mass.  There is no palpable hepatosplenomegaly.  He has a dressing over his abdominal wound.  Back:  No tenderness over the spine, ribs or hips. Extremities:  Show no clubbing, cyanosis or edema.  LABORATORY STUDIES:  White cell count is 3.9, hemoglobin 8.9, hematocrit 29.4, platelet count 172.  MCV is 72.  IMPRESSION:  Mr. Sarver is a  57 year old white gentleman with stage II adenocarcinoma of the sigmoid colon.  I would consider him high risk for recurrence.  His archetype assay score was 36.  Of note, his tumor is KRAS wild type.  We will go ahead and plan for his 3rd cycle of chemo today.  Again, I will give him IV iron with the chemotherapy.    ______________________________ Josph Macho, M.D. PRE/MEDQ  D:  05/05/2012  T:  05/05/2012  Job:  [id number]

## 2012-05-07 ENCOUNTER — Ambulatory Visit (HOSPITAL_BASED_OUTPATIENT_CLINIC_OR_DEPARTMENT_OTHER): Payer: 59

## 2012-05-07 VITALS — BP 152/83 | HR 102 | Temp 98.2°F | Resp 20

## 2012-05-07 DIAGNOSIS — C187 Malignant neoplasm of sigmoid colon: Secondary | ICD-10-CM

## 2012-05-07 MED ORDER — SODIUM CHLORIDE 0.9 % IJ SOLN
10.0000 mL | INTRAMUSCULAR | Status: DC | PRN
  Administered 2012-05-07: 10 mL
  Filled 2012-05-07: qty 10

## 2012-05-07 MED ORDER — HEPARIN SOD (PORK) LOCK FLUSH 100 UNIT/ML IV SOLN
500.0000 [IU] | Freq: Once | INTRAVENOUS | Status: AC | PRN
  Administered 2012-05-07: 500 [IU]
  Filled 2012-05-07: qty 5

## 2012-05-07 NOTE — Patient Instructions (Addendum)

## 2012-05-08 ENCOUNTER — Telehealth: Payer: Self-pay | Admitting: Hematology & Oncology

## 2012-05-08 NOTE — Telephone Encounter (Signed)
Left pt message moved 2-4 to 2-3 °

## 2012-05-11 ENCOUNTER — Ambulatory Visit: Payer: 59 | Admitting: Hematology & Oncology

## 2012-05-11 ENCOUNTER — Other Ambulatory Visit: Payer: 59 | Admitting: Lab

## 2012-05-11 ENCOUNTER — Ambulatory Visit: Payer: 59

## 2012-05-18 ENCOUNTER — Ambulatory Visit (HOSPITAL_BASED_OUTPATIENT_CLINIC_OR_DEPARTMENT_OTHER): Payer: 59

## 2012-05-18 ENCOUNTER — Ambulatory Visit (HOSPITAL_BASED_OUTPATIENT_CLINIC_OR_DEPARTMENT_OTHER): Payer: 59 | Admitting: Hematology & Oncology

## 2012-05-18 ENCOUNTER — Other Ambulatory Visit (HOSPITAL_BASED_OUTPATIENT_CLINIC_OR_DEPARTMENT_OTHER): Payer: 59 | Admitting: Lab

## 2012-05-18 VITALS — BP 116/56 | HR 58 | Temp 98.8°F | Resp 20 | Ht 70.0 in | Wt >= 6400 oz

## 2012-05-18 DIAGNOSIS — C187 Malignant neoplasm of sigmoid colon: Secondary | ICD-10-CM

## 2012-05-18 DIAGNOSIS — Z5111 Encounter for antineoplastic chemotherapy: Secondary | ICD-10-CM

## 2012-05-18 LAB — COMPREHENSIVE METABOLIC PANEL
ALT: 9 U/L (ref 0–53)
Albumin: 3.7 g/dL (ref 3.5–5.2)
CO2: 28 mEq/L (ref 19–32)
Chloride: 103 mEq/L (ref 96–112)
Glucose, Bld: 162 mg/dL — ABNORMAL HIGH (ref 70–99)
Potassium: 4.2 mEq/L (ref 3.5–5.3)
Sodium: 141 mEq/L (ref 135–145)
Total Protein: 6.2 g/dL (ref 6.0–8.3)

## 2012-05-18 LAB — CBC WITH DIFFERENTIAL (CANCER CENTER ONLY)
Eosinophils Absolute: 0.1 10*3/uL (ref 0.0–0.5)
LYMPH#: 1 10*3/uL (ref 0.9–3.3)
LYMPH%: 27.2 % (ref 14.0–48.0)
MCV: 75 fL — ABNORMAL LOW (ref 82–98)
MONO#: 0.4 10*3/uL (ref 0.1–0.9)
NEUT#: 2.3 10*3/uL (ref 1.5–6.5)
Platelets: 351 10*3/uL (ref 145–400)
RBC: 4.17 10*6/uL — ABNORMAL LOW (ref 4.20–5.70)
WBC: 3.8 10*3/uL — ABNORMAL LOW (ref 4.0–10.0)

## 2012-05-18 MED ORDER — SODIUM CHLORIDE 0.9 % IV SOLN
Freq: Once | INTRAVENOUS | Status: AC
  Administered 2012-05-18: 10:00:00 via INTRAVENOUS

## 2012-05-18 MED ORDER — SODIUM CHLORIDE 0.9 % IV SOLN
1920.0000 mg/m2 | INTRAVENOUS | Status: DC
  Administered 2012-05-18: 5900 mg via INTRAVENOUS
  Filled 2012-05-18: qty 118

## 2012-05-18 MED ORDER — FLUOROURACIL CHEMO INJECTION 2.5 GM/50ML
400.0000 mg/m2 | Freq: Once | INTRAVENOUS | Status: AC
  Administered 2012-05-18: 1250 mg via INTRAVENOUS
  Filled 2012-05-18: qty 25

## 2012-05-18 MED ORDER — ONDANSETRON 16 MG/50ML IVPB (CHCC)
16.0000 mg | Freq: Once | INTRAVENOUS | Status: AC
  Administered 2012-05-18: 16 mg via INTRAVENOUS

## 2012-05-18 MED ORDER — SODIUM CHLORIDE 0.9 % IJ SOLN
10.0000 mL | INTRAMUSCULAR | Status: DC | PRN
  Filled 2012-05-18: qty 10

## 2012-05-18 MED ORDER — DEXTROSE 5 % IV SOLN
1240.0000 mg | Freq: Once | INTRAVENOUS | Status: AC
  Administered 2012-05-18: 1240 mg via INTRAVENOUS
  Filled 2012-05-18: qty 62

## 2012-05-18 MED ORDER — ATROPINE SULFATE 1 MG/ML IJ SOLN: 0.5000 mg | Freq: Once | INTRAMUSCULAR | Status: DC | PRN

## 2012-05-18 MED ORDER — DEXAMETHASONE SODIUM PHOSPHATE 4 MG/ML IJ SOLN
20.0000 mg | Freq: Once | INTRAMUSCULAR | Status: AC
  Administered 2012-05-18: 20 mg via INTRAVENOUS

## 2012-05-18 MED ORDER — HEPARIN SOD (PORK) LOCK FLUSH 100 UNIT/ML IV SOLN
500.0000 [IU] | Freq: Once | INTRAVENOUS | Status: DC | PRN
  Filled 2012-05-18: qty 5

## 2012-05-18 NOTE — Progress Notes (Signed)
This office note has been dictated.

## 2012-05-18 NOTE — Patient Instructions (Addendum)
[facility] Discharge Instructions for Patients Receiving Chemotherapy  Today you received the following chemotherapy agents Flourouracil To help prevent nausea and vomiting after your treatment, we encourage you to take your nausea medication as prescribed    If you develop nausea and vomiting that is not controlled by your nausea medication, call the clinic. If it is after clinic hours your family physician or the after hours number for the clinic or go to the Emergency Department.   BELOW ARE SYMPTOMS THAT SHOULD BE REPORTED IMMEDIATELY:  *FEVER GREATER THAN 100.5 F  *CHILLS WITH OR WITHOUT FEVER  NAUSEA AND VOMITING THAT IS NOT CONTROLLED WITH YOUR NAUSEA MEDICATION  *UNUSUAL SHORTNESS OF BREATH  *UNUSUAL BRUISING OR BLEEDING  TENDERNESS IN MOUTH AND THROAT WITH OR WITHOUT PRESENCE OF ULCERS  *URINARY PROBLEMS  *BOWEL PROBLEMS  UNUSUAL RASH Items with * indicate a potential emergency and should be followed up as soon as possible.  One of the nurses will contact you 24 hours after your treatment. Please let the nurse know about any problems that you may have experienced. Feel free to call the clinic you have any questions or concerns. The clinic phone number is [phone].   I have been informed and understand all the instructions given to me. I know to contact the clinic, my physician, or go to the Emergency Department if any problems should occur. I do not have any questions at this time, but understand that I may call the clinic during office hours   should I have any questions or need assistance in obtaining follow up care.    __________________________________________  _____________  __________ Signature of Patient or Authorized Representative            Date                   Time    __________________________________________ Nurse's Signature

## 2012-05-19 ENCOUNTER — Ambulatory Visit: Payer: 59 | Admitting: Hematology & Oncology

## 2012-05-19 ENCOUNTER — Other Ambulatory Visit: Payer: 59 | Admitting: Lab

## 2012-05-19 ENCOUNTER — Ambulatory Visit: Payer: 59

## 2012-05-19 NOTE — Progress Notes (Signed)
CC:   [name], M.D. [name], M.D. [name], M.D.  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  CURRENT THERAPY:  Patient is status post 3 cycles of FOLFIRI (we will omit the irinotecan from here on out.)  INTERIM HISTORY:  Mr. Tosh comes in for his followup.  He still has a draining abdominal wound from his surgery.  This is taking a very long time to heal up.  He is doing okay otherwise.  The diarrhea is not as bad.  From a recent clinical trial, we now know that there is no added benefit to additional chemo agents added to 5-FU for stage II colon cancer.  As such, we will omit irinotecan.  He has had no bleeding.  He has had no mouth sores.  He has had no rashes.  He has had no increase in leg swelling.  PHYSICAL EXAMINATION:  General:  This is a morbidly obese white gentleman in no obvious distress.  Vital signs:  Temperature of 98.8, pulse 58, respiratory rate 20, blood pressure 116/56.  Weight is 408. Head and neck:  Normocephalic, atraumatic skull.  There are no ocular or oral lesions.  He has no palpable cervical or supraclavicular lymph nodes.  Lungs:  Clear bilaterally.  Cardiac:  Regular rate and rhythm with a normal S1 and S2.  There are no murmurs, rubs, or bruits. Abdomen:  Soft abdomen.  He is morbidly obese.  He has a dressing on the laparotomy scar.  He has no guarding or rebound tenderness.  There is no palpable hepatosplenomegaly.  Extremities:  Some trace edema in his legs.  Skin:  No rashes.  LABS:  Pending.  IMPRESSION:  Mr. Guerrette is a 57 year old white gentleman with stage II adenocarcinoma of the sigmoid colon.  He is considered high risk in my opinion.  Of note, his KRAS wild-typed.  His Oncotype score was 36.  We will go ahead and treat just with 5-FU at this point.  I do not see that the added irinotecan is going to be beneficial, at least from the clinical trials run.  We will get him back in 2 weeks for  a followup appointment.    ______________________________ Josph Macho, M.D. PRE/MEDQ  D:  05/18/2012  T:  05/19/2012  Job:  [id number]

## 2012-05-20 ENCOUNTER — Ambulatory Visit (HOSPITAL_BASED_OUTPATIENT_CLINIC_OR_DEPARTMENT_OTHER): Payer: 59

## 2012-05-20 VITALS — BP 134/71 | HR 88 | Temp 97.0°F | Resp 18

## 2012-05-20 DIAGNOSIS — C187 Malignant neoplasm of sigmoid colon: Secondary | ICD-10-CM

## 2012-05-20 DIAGNOSIS — Z452 Encounter for adjustment and management of vascular access device: Secondary | ICD-10-CM

## 2012-05-20 MED ORDER — HEPARIN SOD (PORK) LOCK FLUSH 100 UNIT/ML IV SOLN
500.0000 [IU] | Freq: Once | INTRAVENOUS | Status: AC | PRN
  Administered 2012-05-20: 500 [IU]
  Filled 2012-05-20: qty 5

## 2012-05-20 MED ORDER — COLD PACK MISC ONCOLOGY
1.0000 | Freq: Once | Status: DC | PRN
  Filled 2012-05-20: qty 1

## 2012-05-20 MED ORDER — HEPARIN SOD (PORK) LOCK FLUSH 100 UNIT/ML IV SOLN
250.0000 [IU] | Freq: Once | INTRAVENOUS | Status: DC | PRN
  Filled 2012-05-20: qty 5

## 2012-05-20 MED ORDER — SODIUM CHLORIDE 0.9 % IJ SOLN
10.0000 mL | INTRAMUSCULAR | Status: DC | PRN
  Administered 2012-05-20: 10 mL
  Filled 2012-05-20: qty 10

## 2012-05-20 MED ORDER — SODIUM CHLORIDE 0.9 % IJ SOLN
3.0000 mL | INTRAMUSCULAR | Status: DC | PRN
  Filled 2012-05-20: qty 10

## 2012-05-20 MED ORDER — ALTEPLASE 2 MG IJ SOLR
2.0000 mg | Freq: Once | INTRAMUSCULAR | Status: DC | PRN
  Filled 2012-05-20: qty 2

## 2012-05-20 NOTE — Patient Instructions (Signed)

## 2012-05-21 ENCOUNTER — Encounter (INDEPENDENT_AMBULATORY_CARE_PROVIDER_SITE_OTHER): Payer: Self-pay | Admitting: Surgery

## 2012-05-21 ENCOUNTER — Ambulatory Visit (INDEPENDENT_AMBULATORY_CARE_PROVIDER_SITE_OTHER): Payer: 59 | Admitting: Surgery

## 2012-05-21 VITALS — BP 140/82 | HR 76 | Temp 98.2°F | Resp 20 | Ht >= 80 in | Wt >= 6400 oz

## 2012-05-21 DIAGNOSIS — Z09 Encounter for follow-up examination after completed treatment for conditions other than malignant neoplasm: Secondary | ICD-10-CM

## 2012-05-21 MED ORDER — DOXYCYCLINE HYCLATE 100 MG PO TABS: 100.0000 mg | ORAL_TABLET | Freq: Two times a day (BID) | ORAL | Status: DC

## 2012-05-21 NOTE — Progress Notes (Signed)
Subjective:     Patient ID: Brandon Schaefer, male   DOB: September 18, 1955, 57 y.o.   MRN: [id number]  HPI He is here for another wound check. He is undergoing active chemotherapy. He remains on antibiotics.  Review of Systems     Objective:   Physical Exam There is no erythema the wound but still purulent drainage. It is still several inches deep. I again treated with silver nitrate    Assessment:     Postoperative abdominal wound    Plan:     Given his chemotherapy, I will continue his antibiotics and local wound care. I will see him back in 3 weeks

## 2012-06-01 ENCOUNTER — Ambulatory Visit (HOSPITAL_BASED_OUTPATIENT_CLINIC_OR_DEPARTMENT_OTHER): Payer: 59 | Admitting: Hematology & Oncology

## 2012-06-01 ENCOUNTER — Other Ambulatory Visit (HOSPITAL_BASED_OUTPATIENT_CLINIC_OR_DEPARTMENT_OTHER): Payer: 59 | Admitting: Lab

## 2012-06-01 ENCOUNTER — Ambulatory Visit (HOSPITAL_BASED_OUTPATIENT_CLINIC_OR_DEPARTMENT_OTHER): Payer: 59

## 2012-06-01 VITALS — BP 134/62 | HR 89 | Temp 97.8°F | Resp 22 | Ht 74.0 in | Wt >= 6400 oz

## 2012-06-01 DIAGNOSIS — C187 Malignant neoplasm of sigmoid colon: Secondary | ICD-10-CM

## 2012-06-01 DIAGNOSIS — Z5111 Encounter for antineoplastic chemotherapy: Secondary | ICD-10-CM

## 2012-06-01 DIAGNOSIS — E119 Type 2 diabetes mellitus without complications: Secondary | ICD-10-CM

## 2012-06-01 DIAGNOSIS — Z452 Encounter for adjustment and management of vascular access device: Secondary | ICD-10-CM

## 2012-06-01 LAB — CBC WITH DIFFERENTIAL (CANCER CENTER ONLY)
BASO%: 0.6 % (ref 0.0–2.0)
EOS%: 1.8 % (ref 0.0–7.0)
LYMPH%: 11.9 % — ABNORMAL LOW (ref 14.0–48.0)
MCH: 23.6 pg — ABNORMAL LOW (ref 28.0–33.4)
MCHC: 30.9 g/dL — ABNORMAL LOW (ref 32.0–35.9)
MCV: 76 fL — ABNORMAL LOW (ref 82–98)
MONO%: 5 % (ref 0.0–13.0)
NEUT#: 9.5 10*3/uL — ABNORMAL HIGH (ref 1.5–6.5)
Platelets: 230 10*3/uL (ref 145–400)
RBC: 4.5 10*6/uL (ref 4.20–5.70)
RDW: 22.6 % — ABNORMAL HIGH (ref 11.1–15.7)

## 2012-06-01 LAB — COMPREHENSIVE METABOLIC PANEL
AST: 11 U/L (ref 0–37)
Albumin: 4 g/dL (ref 3.5–5.2)
Alkaline Phosphatase: 78 U/L (ref 39–117)
Glucose, Bld: 178 mg/dL — ABNORMAL HIGH (ref 70–99)
Potassium: 4.3 mEq/L (ref 3.5–5.3)
Sodium: 136 mEq/L (ref 135–145)
Total Bilirubin: 0.4 mg/dL (ref 0.3–1.2)
Total Protein: 6.6 g/dL (ref 6.0–8.3)

## 2012-06-01 MED ORDER — ONDANSETRON 16 MG/50ML IVPB (CHCC)
16.0000 mg | Freq: Once | INTRAVENOUS | Status: AC
  Administered 2012-06-01: 16 mg via INTRAVENOUS

## 2012-06-01 MED ORDER — ALTEPLASE 2 MG IJ SOLR
2.0000 mg | Freq: Once | INTRAMUSCULAR | Status: AC | PRN
  Administered 2012-06-01: 2 mg
  Filled 2012-06-01: qty 2

## 2012-06-01 MED ORDER — SODIUM CHLORIDE 0.9 % IJ SOLN
10.0000 mL | INTRAMUSCULAR | Status: DC | PRN
  Filled 2012-06-01: qty 10

## 2012-06-01 MED ORDER — DEXAMETHASONE SODIUM PHOSPHATE 4 MG/ML IJ SOLN
20.0000 mg | Freq: Once | INTRAMUSCULAR | Status: AC
  Administered 2012-06-01: 20 mg via INTRAVENOUS

## 2012-06-01 MED ORDER — FLUOROURACIL CHEMO INJECTION 2.5 GM/50ML
400.0000 mg/m2 | Freq: Once | INTRAVENOUS | Status: AC
  Administered 2012-06-01: 1250 mg via INTRAVENOUS
  Filled 2012-06-01: qty 25

## 2012-06-01 MED ORDER — LEUCOVORIN CALCIUM INJECTION 350 MG
1240.0000 mg | Freq: Once | INTRAVENOUS | Status: AC
  Administered 2012-06-01: 1240 mg via INTRAVENOUS
  Filled 2012-06-01: qty 62

## 2012-06-01 MED ORDER — SODIUM CHLORIDE 0.9 % IV SOLN
1920.0000 mg/m2 | INTRAVENOUS | Status: DC
  Administered 2012-06-01: 5900 mg via INTRAVENOUS
  Filled 2012-06-01: qty 118

## 2012-06-01 MED ORDER — SODIUM CHLORIDE 0.9 % IV SOLN
Freq: Once | INTRAVENOUS | Status: AC
  Administered 2012-06-01: 12:00:00 via INTRAVENOUS

## 2012-06-01 MED ORDER — HEPARIN SOD (PORK) LOCK FLUSH 100 UNIT/ML IV SOLN
500.0000 [IU] | Freq: Once | INTRAVENOUS | Status: DC | PRN
  Filled 2012-06-01: qty 5

## 2012-06-01 NOTE — Patient Instructions (Addendum)
[facility] Discharge Instructions for Patients Receiving Chemotherapy  Today you received the following chemotherapy agents 5 FU/Leucovorin To help prevent nausea and vomiting after your treatment, we encourage you to take your nausea medication as prescribed.   If you develop nausea and vomiting that is not controlled by your nausea medication, call the clinic. If it is after clinic hours your family physician or the after hours number for the clinic or go to the Emergency Department.   BELOW ARE SYMPTOMS THAT SHOULD BE REPORTED IMMEDIATELY:  *FEVER GREATER THAN 100.5 F  *CHILLS WITH OR WITHOUT FEVER  NAUSEA AND VOMITING THAT IS NOT CONTROLLED WITH YOUR NAUSEA MEDICATION  *UNUSUAL SHORTNESS OF BREATH  *UNUSUAL BRUISING OR BLEEDING  TENDERNESS IN MOUTH AND THROAT WITH OR WITHOUT PRESENCE OF ULCERS  *URINARY PROBLEMS  *BOWEL PROBLEMS  UNUSUAL RASH Items with * indicate a potential emergency and should be followed up as soon as possible.  One of the nurses will contact you 24 hours after your treatment. Please let the nurse know about any problems that you may have experienced. Feel free to call the clinic you have any questions or concerns. The clinic phone number is [phone].   I have been informed and understand all the instructions given to me. I know to contact the clinic, my physician, or go to the Emergency Department if any problems should occur. I do not have any questions at this time, but understand that I may call the clinic during office hours   should I have any questions or need assistance in obtaining follow up care.    __________________________________________  _____________  __________ Signature of Patient or Authorized Representative            Date                   Time    __________________________________________ Nurse's Signature    

## 2012-06-01 NOTE — Progress Notes (Signed)
This office note has been dictated.

## 2012-06-02 NOTE — Progress Notes (Signed)
CC:   [name], M.D. [name], M.D. [name], M.D.  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  CURRENT THERAPY:  The patient is status post 4 cycles of infusional 5FU/leucovorin.  INTERIM HISTORY:  Brandon Schaefer comes in for followup.  He did well with the last cycle of chemotherapy.  We omitted the irinotecan.  Studies with stage II colon cancer have shown that there is no added benefit to a 3rd chemo drug.  As such, the infusional 5FU over 2 days is what we are doing and this seems to be agreeing with him.  The abdominal wound is healing up slowly.  He still has a dressing on the lower part of the wound but this appears to be doing better.  He has had no nausea or vomiting.  He has had no diarrhea.  His blood sugars seem to be doing okay.  He has had no rashes.  There has been no increase in leg swelling.  Overall, his performance status is ECOG 2.  PHYSICAL EXAMINATION:  General:  This is a morbidly obese white gentleman in no obvious distress.  Vital signs:  Show temperature of 97.8, pulse 89, respiratory rate 16, blood pressure 134/62.  Weight is 405.  Head and neck:  Shows a normocephalic, atraumatic skull.  There are no ocular or oral lesions.  There are no palpable cervical or supraclavicular lymph nodes.  Lungs:  Clear bilaterally.  Cardiac: Regular rate and rhythm with a normal S1 and S2.  There are no murmurs, rubs or bruits.  Abdomen:  Abdomen is morbidly obese.  He has a laparotomy scar that is healing.  This is healing by second intention. There is still an open area in the inferior aspect of the wound that has a dressing on it.  There is no fluid wave in the abdomen.  There is no palpable hepatosplenomegaly.  Back:  No tenderness over the spine, ribs or hips.  Extremities:  Show chronic nonpitting edema of his lower legs. Skin:  Shows some stasis dermatitis changes in the lower legs.  LABORATORY STUDIES:   White cell count is 11.8, hemoglobin 10.6, hematocrit 34.3, platelet count 230.  IMPRESSION:  Brandon Schaefer is a 57 year old gentleman with stage II adenocarcinoma of the sigmoid colon.  We did send his tumor off for Oncotype score.  He scores fairly high at 36.  As such, I felt that he would be a candidate for adjuvant chemotherapy.  We will continue just the infusional 5FU.  This hopefully will be adequate for minimizing his recurrence.  We will plan to get him back in another 2 weeks.    ______________________________ Brandon Schaefer, M.D. PRE/MEDQ  D:  06/01/2012  T:  06/02/2012  Job:  [id number]

## 2012-06-03 ENCOUNTER — Ambulatory Visit (HOSPITAL_BASED_OUTPATIENT_CLINIC_OR_DEPARTMENT_OTHER): Payer: 59

## 2012-06-03 VITALS — BP 138/82 | HR 82 | Temp 98.0°F

## 2012-06-03 DIAGNOSIS — C187 Malignant neoplasm of sigmoid colon: Secondary | ICD-10-CM

## 2012-06-03 MED ORDER — SODIUM CHLORIDE 0.9 % IJ SOLN
10.0000 mL | INTRAMUSCULAR | Status: DC | PRN
  Administered 2012-06-03: 10 mL
  Filled 2012-06-03: qty 10

## 2012-06-03 MED ORDER — HEPARIN SOD (PORK) LOCK FLUSH 100 UNIT/ML IV SOLN
500.0000 [IU] | Freq: Once | INTRAVENOUS | Status: AC | PRN
  Administered 2012-06-03: 500 [IU]
  Filled 2012-06-03: qty 5

## 2012-06-03 NOTE — Patient Instructions (Signed)
Fluorouracil, 5-FU injection What is this medicine? FLUOROURACIL, 5-FU (flure oh YOOR a sil) is a chemotherapy drug. It slows the growth of cancer cells. This medicine is used to treat many types of cancer like breast cancer, colon or rectal cancer, pancreatic cancer, and stomach cancer. This medicine may be used for other purposes; ask your health care provider or pharmacist if you have questions. What should I tell my health care provider before I take this medicine? They need to know if you have any of these conditions: -blood disorders -dihydropyrimidine dehydrogenase (DPD) deficiency -infection (especially a virus infection such as chickenpox, cold sores, or herpes) -kidney disease -liver disease -malnourished, poor nutrition -recent or ongoing radiation therapy -an unusual or allergic reaction to fluorouracil, other chemotherapy, other medicines, foods, dyes, or preservatives -pregnant or trying to get pregnant -breast-feeding How should I use this medicine? This drug is given as an infusion or injection into a vein. It is administered in a hospital or clinic by a specially trained health care professional. Talk to your pediatrician regarding the use of this medicine in children. Special care may be needed. Overdosage: If you think you have taken too much of this medicine contact a poison control center or emergency room at once. NOTE: This medicine is only for you. Do not share this medicine with others. What if I miss a dose? It is important not to miss your dose. Call your doctor or health care professional if you are unable to keep an appointment. What may interact with this medicine? -allopurinol -cimetidine -dapsone -digoxin -hydroxyurea -leucovorin -levamisole -medicines for seizures like ethotoin, fosphenytoin, phenytoin -medicines to increase blood counts like filgrastim, pegfilgrastim, sargramostim -medicines that treat or prevent blood clots like warfarin,  enoxaparin, and dalteparin -methotrexate -metronidazole -pyrimethamine -some other chemotherapy drugs like busulfan, cisplatin, estramustine, vinblastine -trimethoprim -trimetrexate -vaccines Talk to your doctor or health care professional before taking any of these medicines: -acetaminophen -aspirin -ibuprofen -ketoprofen -naproxen This list may not describe all possible interactions. Give your health care provider a list of all the medicines, herbs, non-prescription drugs, or dietary supplements you use. Also tell them if you smoke, drink alcohol, or use illegal drugs. Some items may interact with your medicine. What should I watch for while using this medicine? Visit your doctor for checks on your progress. This drug may make you feel generally unwell. This is not uncommon, as chemotherapy can affect healthy cells as well as cancer cells. Report any side effects. Continue your course of treatment even though you feel ill unless your doctor tells you to stop. In some cases, you may be given additional medicines to help with side effects. Follow all directions for their use. Call your doctor or health care professional for advice if you get a fever, chills or sore throat, or other symptoms of a cold or flu. Do not treat yourself. This drug decreases your body's ability to fight infections. Try to avoid being around people who are sick. This medicine may increase your risk to bruise or bleed. Call your doctor or health care professional if you notice any unusual bleeding. Be careful brushing and flossing your teeth or using a toothpick because you may get an infection or bleed more easily. If you have any dental work done, tell your dentist you are receiving this medicine. Avoid taking products that contain aspirin, acetaminophen, ibuprofen, naproxen, or ketoprofen unless instructed by your doctor. These medicines may hide a fever. Do not become pregnant while taking this medicine. Women should    inform their doctor if they wish to become pregnant or think they might be pregnant. There is a potential for serious side effects to an unborn child. Talk to your health care professional or pharmacist for more information. Do not breast-feed an infant while taking this medicine. Men should inform their doctor if they wish to father a child. This medicine may lower sperm counts. Do not treat diarrhea with over the counter products. Contact your doctor if you have diarrhea that lasts more than 2 days or if it is severe and watery. This medicine can make you more sensitive to the sun. Keep out of the sun. If you cannot avoid being in the sun, wear protective clothing and use sunscreen. Do not use sun lamps or tanning beds/booths. What side effects may I notice from receiving this medicine? Side effects that you should report to your doctor or health care professional as soon as possible: -allergic reactions like skin rash, itching or hives, swelling of the face, lips, or tongue -low blood counts - this medicine may decrease the number of white blood cells, red blood cells and platelets. You may be at increased risk for infections and bleeding. -signs of infection - fever or chills, cough, sore throat, pain or difficulty passing urine -signs of decreased platelets or bleeding - bruising, pinpoint red spots on the skin, black, tarry stools, blood in the urine -signs of decreased red blood cells - unusually weak or tired, fainting spells, lightheadedness -breathing problems -changes in vision -chest pain -mouth sores -nausea and vomiting -pain, swelling, redness at site where injected -pain, tingling, numbness in the hands or feet -redness, swelling, or sores on hands or feet -stomach pain -unusual bleeding Side effects that usually do not require medical attention (report to your doctor or health care professional if they continue or are bothersome): -changes in finger or toe  nails -diarrhea -dry or itchy skin -hair loss -headache -loss of appetite -sensitivity of eyes to the light -stomach upset -unusually teary eyes This list may not describe all possible side effects. Call your doctor for medical advice about side effects. You may report side effects to FDA at 1-800-FDA-1088. Where should I keep my medicine? This drug is given in a hospital or clinic and will not be stored at home. NOTE: This sheet is a summary. It may not cover all possible information. If you have questions about this medicine, talk to your doctor, pharmacist, or health care provider.  2012, [employer]. (08/05/2007 1:53:16 PM) 

## 2012-06-08 ENCOUNTER — Encounter (INDEPENDENT_AMBULATORY_CARE_PROVIDER_SITE_OTHER): Payer: Self-pay | Admitting: Surgery

## 2012-06-08 ENCOUNTER — Ambulatory Visit (INDEPENDENT_AMBULATORY_CARE_PROVIDER_SITE_OTHER): Payer: 59 | Admitting: Surgery

## 2012-06-08 VITALS — BP 130/72 | HR 97 | Temp 97.4°F | Resp 20 | Ht 72.0 in | Wt >= 6400 oz

## 2012-06-08 DIAGNOSIS — Z09 Encounter for follow-up examination after completed treatment for conditions other than malignant neoplasm: Secondary | ICD-10-CM

## 2012-06-08 NOTE — Progress Notes (Signed)
Subjective:     Patient ID: Brandon Schaefer, male   DOB: [birth year], 57 y.o.   MRN: [id number]  HPI He is still undergoing chemotherapy. He has noticed a new blister develop on the upper part of his incision. He is still undergoing wound packing for lower part  Review of Systems     Objective:   Physical Exam There is a blister on the upper part of his incision which opened up and drained serous fluid. The lower wound still has a moderate purulence. It is still quite deep. I treated both with a silver nitrate    Assessment:     Postop wound infection     Plan:     I will continue the current wound care. He will finish his course of antibiotics. I will see him back in 2-3 weeks and probably re-culture the wound at that time

## 2012-06-15 ENCOUNTER — Other Ambulatory Visit: Payer: 59 | Admitting: Lab

## 2012-06-15 ENCOUNTER — Other Ambulatory Visit (HOSPITAL_BASED_OUTPATIENT_CLINIC_OR_DEPARTMENT_OTHER): Payer: 59 | Admitting: Lab

## 2012-06-15 ENCOUNTER — Ambulatory Visit: Payer: 59

## 2012-06-15 ENCOUNTER — Ambulatory Visit (HOSPITAL_BASED_OUTPATIENT_CLINIC_OR_DEPARTMENT_OTHER): Payer: 59

## 2012-06-15 ENCOUNTER — Ambulatory Visit (HOSPITAL_BASED_OUTPATIENT_CLINIC_OR_DEPARTMENT_OTHER): Payer: 59 | Admitting: Medical

## 2012-06-15 ENCOUNTER — Ambulatory Visit: Payer: 59 | Admitting: Hematology & Oncology

## 2012-06-15 VITALS — BP 134/50 | HR 94 | Temp 98.1°F | Resp 20 | Ht 70.0 in | Wt >= 6400 oz

## 2012-06-15 DIAGNOSIS — C187 Malignant neoplasm of sigmoid colon: Secondary | ICD-10-CM

## 2012-06-15 DIAGNOSIS — Z452 Encounter for adjustment and management of vascular access device: Secondary | ICD-10-CM

## 2012-06-15 DIAGNOSIS — Z5111 Encounter for antineoplastic chemotherapy: Secondary | ICD-10-CM

## 2012-06-15 LAB — CBC WITH DIFFERENTIAL (CANCER CENTER ONLY)
BASO%: 0.3 % (ref 0.0–2.0)
LYMPH%: 13.7 % — ABNORMAL LOW (ref 14.0–48.0)
MCH: 24.7 pg — ABNORMAL LOW (ref 28.0–33.4)
MCV: 78 fL — ABNORMAL LOW (ref 82–98)
MONO%: 7.1 % (ref 0.0–13.0)
NEUT#: 8.1 10*3/uL — ABNORMAL HIGH (ref 1.5–6.5)
Platelets: 181 10*3/uL (ref 145–400)
RDW: 23.2 % — ABNORMAL HIGH (ref 11.1–15.7)
WBC: 10.6 10*3/uL — ABNORMAL HIGH (ref 4.0–10.0)

## 2012-06-15 LAB — COMPREHENSIVE METABOLIC PANEL
ALT: 10 U/L (ref 0–53)
AST: 11 U/L (ref 0–37)
Alkaline Phosphatase: 81 U/L (ref 39–117)
Sodium: 137 mEq/L (ref 135–145)
Total Bilirubin: 0.6 mg/dL (ref 0.3–1.2)
Total Protein: 6.4 g/dL (ref 6.0–8.3)

## 2012-06-15 LAB — CEA: CEA: 1.7 ng/mL (ref 0.0–5.0)

## 2012-06-15 MED ORDER — ALTEPLASE 2 MG IJ SOLR
2.0000 mg | Freq: Once | INTRAMUSCULAR | Status: AC
  Administered 2012-06-15: 2 mg
  Filled 2012-06-15: qty 2

## 2012-06-15 MED ORDER — LORAZEPAM 1 MG PO TABS
1.0000 mg | ORAL_TABLET | Freq: Once | ORAL | Status: AC
  Administered 2012-06-15: 1 mg via ORAL

## 2012-06-15 MED ORDER — FLUOROURACIL CHEMO INJECTION 2.5 GM/50ML
400.0000 mg/m2 | Freq: Once | INTRAVENOUS | Status: AC
  Administered 2012-06-15: 1250 mg via INTRAVENOUS
  Filled 2012-06-15: qty 25

## 2012-06-15 MED ORDER — ALTEPLASE 2 MG IJ SOLR
2.0000 mg | Freq: Once | INTRAMUSCULAR | Status: AC | PRN
  Administered 2012-06-15: 2 mg
  Filled 2012-06-15: qty 2

## 2012-06-15 MED ORDER — SODIUM CHLORIDE 0.9 % IV SOLN
Freq: Once | INTRAVENOUS | Status: AC
  Administered 2012-06-15: 12:00:00 via INTRAVENOUS

## 2012-06-15 MED ORDER — SODIUM CHLORIDE 0.9 % IJ SOLN
10.0000 mL | INTRAMUSCULAR | Status: DC | PRN
  Filled 2012-06-15: qty 10

## 2012-06-15 MED ORDER — LEUCOVORIN CALCIUM INJECTION 350 MG
402.5000 mg/m2 | Freq: Once | INTRAVENOUS | Status: AC
  Administered 2012-06-15: 1240 mg via INTRAVENOUS
  Filled 2012-06-15: qty 62

## 2012-06-15 MED ORDER — SODIUM CHLORIDE 0.9 % IV SOLN
1920.0000 mg/m2 | INTRAVENOUS | Status: DC
  Administered 2012-06-15: 5900 mg via INTRAVENOUS
  Filled 2012-06-15: qty 118

## 2012-06-15 MED ORDER — HEPARIN SOD (PORK) LOCK FLUSH 100 UNIT/ML IV SOLN
500.0000 [IU] | Freq: Once | INTRAVENOUS | Status: DC | PRN
  Filled 2012-06-15: qty 5

## 2012-06-15 MED ORDER — DEXAMETHASONE SODIUM PHOSPHATE 4 MG/ML IJ SOLN
20.0000 mg | Freq: Once | INTRAMUSCULAR | Status: AC
  Administered 2012-06-15: 20 mg via INTRAVENOUS

## 2012-06-15 MED ORDER — ONDANSETRON 16 MG/50ML IVPB (CHCC)
16.0000 mg | Freq: Once | INTRAVENOUS | Status: AC
  Administered 2012-06-15: 16 mg via INTRAVENOUS

## 2012-06-15 NOTE — Patient Instructions (Signed)
[facility] Discharge Instructions for Patients Receiving Chemotherapy  Today you received the following chemotherapy agents 5FU Leucovorin  To help prevent nausea and vomiting after your treatment, we encourage you to take your nausea medication as prescribed    If you develop nausea and vomiting that is not controlled by your nausea medication, call the clinic. If it is after clinic hours your family physician or the after hours number for the clinic or go to the Emergency Department.   BELOW ARE SYMPTOMS THAT SHOULD BE REPORTED IMMEDIATELY:  *FEVER GREATER THAN 100.5 F  *CHILLS WITH OR WITHOUT FEVER  NAUSEA AND VOMITING THAT IS NOT CONTROLLED WITH YOUR NAUSEA MEDICATION  *UNUSUAL SHORTNESS OF BREATH  *UNUSUAL BRUISING OR BLEEDING  TENDERNESS IN MOUTH AND THROAT WITH OR WITHOUT PRESENCE OF ULCERS  *URINARY PROBLEMS  *BOWEL PROBLEMS  UNUSUAL RASH Items with * indicate a potential emergency and should be followed up as soon as possible.  One of the nurses will contact you 24 hours after your treatment. Please let the nurse know about any problems that you may have experienced. Feel free to call the clinic you have any questions or concerns. The clinic phone number is [phone].   I have been informed and understand all the instructions given to me. I know to contact the clinic, my physician, or go to the Emergency Department if any problems should occur. I do not have any questions at this time, but understand that I may call the clinic during office hours   should I have any questions or need assistance in obtaining follow up care.    __________________________________________  _____________  __________ Signature of Patient or Authorized Representative            Date                   Time    __________________________________________ Nurse's Signature   Fluorouracil, 5-FU injection What is this medicine? FLUOROURACIL, 5-FU (flure oh  YOOR a sil) is a chemotherapy drug. It slows the growth of cancer cells. This medicine is used to treat many types of cancer like breast cancer, colon or rectal cancer, pancreatic cancer, and stomach cancer. This medicine may be used for other purposes; ask your health care provider or pharmacist if you have questions. What should I tell my health care provider before I take this medicine? They need to know if you have any of these conditions: -blood disorders -dihydropyrimidine dehydrogenase (DPD) deficiency -infection (especially a virus infection such as chickenpox, cold sores, or herpes) -kidney disease -liver disease -malnourished, poor nutrition -recent or ongoing radiation therapy -an unusual or allergic reaction to fluorouracil, other chemotherapy, other medicines, foods, dyes, or preservatives -pregnant or trying to get pregnant -breast-feeding How should I use this medicine? This drug is given as an infusion or injection into a vein. It is administered in a hospital or clinic by a specially trained health care professional. Talk to your pediatrician regarding the use of this medicine in children. Special care may be needed. Overdosage: If you think you have taken too much of this medicine contact a poison control center or emergency room at once. NOTE: This medicine is only for you. Do not share this medicine with others. What if I miss a dose? It is important not to miss your dose. Call your doctor or health care professional if you are unable to keep an appointment. What may interact with this medicine? -allopurinol -  cimetidine -dapsone -digoxin -hydroxyurea -leucovorin -levamisole -medicines for seizures like ethotoin, fosphenytoin, phenytoin -medicines to increase blood counts like filgrastim, pegfilgrastim, sargramostim -medicines that treat or prevent blood clots like warfarin, enoxaparin, and dalteparin -methotrexate -metronidazole -pyrimethamine -some other  chemotherapy drugs like busulfan, cisplatin, estramustine, vinblastine -trimethoprim -trimetrexate -vaccines Talk to your doctor or health care professional before taking any of these medicines: -acetaminophen -aspirin -ibuprofen -ketoprofen -naproxen This list may not describe all possible interactions. Give your health care provider a list of all the medicines, herbs, non-prescription drugs, or dietary supplements you use. Also tell them if you smoke, drink alcohol, or use illegal drugs. Some items may interact with your medicine. What should I watch for while using this medicine? Visit your doctor for checks on your progress. This drug may make you feel generally unwell. This is not uncommon, as chemotherapy can affect healthy cells as well as cancer cells. Report any side effects. Continue your course of treatment even though you feel ill unless your doctor tells you to stop. In some cases, you may be given additional medicines to help with side effects. Follow all directions for their use. Call your doctor or health care professional for advice if you get a fever, chills or sore throat, or other symptoms of a cold or flu. Do not treat yourself. This drug decreases your body's ability to fight infections. Try to avoid being around people who are sick. This medicine may increase your risk to bruise or bleed. Call your doctor or health care professional if you notice any unusual bleeding. Be careful brushing and flossing your teeth or using a toothpick because you may get an infection or bleed more easily. If you have any dental work done, tell your dentist you are receiving this medicine. Avoid taking products that contain aspirin, acetaminophen, ibuprofen, naproxen, or ketoprofen unless instructed by your doctor. These medicines may hide a fever. Do not become pregnant while taking this medicine. Women should inform their doctor if they wish to become pregnant or think they might be pregnant.  There is a potential for serious side effects to an unborn child. Talk to your health care professional or pharmacist for more information. Do not breast-feed an infant while taking this medicine. Men should inform their doctor if they wish to father a child. This medicine may lower sperm counts. Do not treat diarrhea with over the counter products. Contact your doctor if you have diarrhea that lasts more than 2 days or if it is severe and watery. This medicine can make you more sensitive to the sun. Keep out of the sun. If you cannot avoid being in the sun, wear protective clothing and use sunscreen. Do not use sun lamps or tanning beds/booths. What side effects may I notice from receiving this medicine? Side effects that you should report to your doctor or health care professional as soon as possible: -allergic reactions like skin rash, itching or hives, swelling of the face, lips, or tongue -low blood counts - this medicine may decrease the number of white blood cells, red blood cells and platelets. You may be at increased risk for infections and bleeding. -signs of infection - fever or chills, cough, sore throat, pain or difficulty passing urine -signs of decreased platelets or bleeding - bruising, pinpoint red spots on the skin, black, tarry stools, blood in the urine -signs of decreased red blood cells - unusually weak or tired, fainting spells, lightheadedness -breathing problems -changes in vision -chest pain -mouth sores -nausea and vomiting -  pain, swelling, redness at site where injected -pain, tingling, numbness in the hands or feet -redness, swelling, or sores on hands or feet -stomach pain -unusual bleeding Side effects that usually do not require medical attention (report to your doctor or health care professional if they continue or are bothersome): -changes in finger or toe nails -diarrhea -dry or itchy skin -hair loss -headache -loss of appetite -sensitivity of eyes to  the light -stomach upset -unusually teary eyes This list may not describe all possible side effects. Call your doctor for medical advice about side effects. You may report side effects to FDA at 1-800-FDA-1088. Where should I keep my medicine? This drug is given in a hospital or clinic and will not be stored at home. NOTE: This sheet is a summary. It may not cover all possible information. If you have questions about this medicine, talk to your doctor, pharmacist, or health care provider.  2012, [employer]. (08/05/2007 1:53:16 PM)

## 2012-06-15 NOTE — Progress Notes (Signed)
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  CURRENT THERAPY:  The patient is status post 5 cycles of infusional 5FU/leucovorin.  INTERIM HISTORY: Brandon Schaefer presents today for an office followup visit.  His family accompanies him.  Overall, he, reports, that he's been doing quite well.  He, reports, that he's not having any more issues with diarrhea since the Irinotecan has been discontinued.  He is now back on a normal bowel movement regimen.  Of note, we are just doing the infusional 5-FU over 2 days.  His abdominal wound is healing up slowly.  He still has a dressing on the lower part of the wound, but this appears to be doing better.  He does have some intermittent nausea for which he utilizes his anti-medics.  He denies any active, vomiting.  He denies any diarrhea, or constipation.  Any chest pain, shortness of breath, or cough.  He does have some dyspnea on exertion.  He denies any fevers, chills, or night sweats.  He denies any headaches, visual changes, or rashes.  He does have some lower leg swelling, which is chronic.  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  However, this is from diabetes.  He is now up and walking without a walker.  I did advise him that he needs to be more active with his walking.  He seems that he is sedentary.  Most of the time, and I do not want to see him with any type of thrombosis. Overall, his performance status is ECOG 2.  Review of Systems: Constitutional:Negative for malaise/fatigue, fever, chills, weight loss, diaphoresis, activity change, appetite change, and unexpected weight change.  HEENT: Negative for double vision, blurred vision, visual loss, ear pain, tinnitus, congestion, rhinorrhea, epistaxis sore throat or sinus disease, oral pain/lesion, tongue soreness Respiratory: Negative for cough, chest tightness, shortness of breath, wheezing and stridor.  Cardiovascular: Negative for chest pain, palpitations, leg swelling, orthopnea,  PND, DOE or claudication Gastrointestinal: Negative for nausea, vomiting, abdominal pain, diarrhea, constipation, blood in stool, melena, hematochezia, abdominal distention, anal bleeding, rectal pain, anorexia and hematemesis.  Genitourinary: Negative for dysuria, frequency, hematuria,  Musculoskeletal: Negative for myalgias, back pain, joint swelling, arthralgias and gait problem.  Skin: Negative for rash, color change, pallor and wound.  Neurological:. Negative for dizziness/light-headedness, tremors, seizures, syncope, facial asymmetry, speech difficulty, weakness, numbness, headaches and paresthesias.  Hematological: Negative for adenopathy. Does not bruise/bleed easily.  Psychiatric/Behavioral:  Negative for depression, no loss of interest in normal activity or change in sleep pattern.   Physical Exam: This is a morbidly, obese, 57 year old, white gentleman, in no obvious distress Vitals: Temperature 98.1 degrees, pulse 94, respirations 20, blood pressure 134/50 weight 209 pounds HEENT reveals a normocephalic, atraumatic skull, no scleral icterus, no oral lesions  Neck is supple without any cervical or supraclavicular adenopathy.  Lungs are clear to auscultation bilaterally. There are no wheezes, rales or rhonci Cardiac is regular rate and rhythm with a normal S1 and S2. There are no murmurs, rubs, or bruits.  Abdomen is soft with good bowel sounds, there is no palpable mass. There is no palpable hepatosplenomegaly. There is no palpable fluid wave.  Musculoskeletal no tenderness of the spine, ribs, or hips.  Extremities there are no clubbing, cyanosis, or edema.  Skin no petechia, purpura or ecchymosis Neurologic is nonfocal.  Laboratory Data: White count 10.6, hemoglobin 1.2, hematocrit 35.5.  Platelets 181,000  Assessment/Plan: This is a pleasant, 57 year old, white gentleman, with the following issues:  #  1.  Stage II adenocarcinoma of the sigmoid colon.  We did send his tumor off  for Oncotype score.  His scores, fairly high at 36.  As, such, we felt that he would be a candidate for adjuvant chemotherapy.  For now, we will continue just the infusional 5-FU.  This hopefully, will be adequate for minimizing his recurrence.  #2.  Diarrhea.  This has now resolved since he we have discontinued the Irinotecan.   #3.  Decreased activity level.  I did explain to Brandon Schaefer that he needs to get up and walk some throughout the day to help minimize his risk for any type of thrombosis.  #4.  Followup.  Brandon Schaefer will follow back up with [place] in 2 weeks, but before then should there be questions or concerns.

## 2012-06-17 ENCOUNTER — Ambulatory Visit (HOSPITAL_BASED_OUTPATIENT_CLINIC_OR_DEPARTMENT_OTHER): Payer: 59

## 2012-06-17 DIAGNOSIS — C187 Malignant neoplasm of sigmoid colon: Secondary | ICD-10-CM

## 2012-06-17 MED ORDER — HEPARIN SOD (PORK) LOCK FLUSH 100 UNIT/ML IV SOLN
500.0000 [IU] | Freq: Once | INTRAVENOUS | Status: AC | PRN
  Administered 2012-06-17: 500 [IU]
  Filled 2012-06-17: qty 5

## 2012-06-17 MED ORDER — SODIUM CHLORIDE 0.9 % IJ SOLN
10.0000 mL | INTRAMUSCULAR | Status: DC | PRN
  Administered 2012-06-17: 10 mL
  Filled 2012-06-17: qty 10

## 2012-06-25 ENCOUNTER — Other Ambulatory Visit: Payer: Self-pay | Admitting: Medical

## 2012-06-25 DIAGNOSIS — C187 Malignant neoplasm of sigmoid colon: Secondary | ICD-10-CM

## 2012-06-28 ENCOUNTER — Other Ambulatory Visit (INDEPENDENT_AMBULATORY_CARE_PROVIDER_SITE_OTHER): Payer: Self-pay | Admitting: Surgery

## 2012-06-29 ENCOUNTER — Encounter (INDEPENDENT_AMBULATORY_CARE_PROVIDER_SITE_OTHER): Payer: Self-pay | Admitting: Surgery

## 2012-06-29 ENCOUNTER — Ambulatory Visit (INDEPENDENT_AMBULATORY_CARE_PROVIDER_SITE_OTHER): Payer: 59 | Admitting: Surgery

## 2012-06-29 ENCOUNTER — Ambulatory Visit: Payer: 59

## 2012-06-29 ENCOUNTER — Ambulatory Visit: Payer: 59 | Admitting: Medical

## 2012-06-29 ENCOUNTER — Other Ambulatory Visit: Payer: 59 | Admitting: Lab

## 2012-06-29 VITALS — BP 168/74 | HR 96 | Temp 97.2°F | Resp 28 | Ht 72.0 in | Wt >= 6400 oz

## 2012-06-29 DIAGNOSIS — T8189XD Other complications of procedures, not elsewhere classified, subsequent encounter: Secondary | ICD-10-CM

## 2012-06-29 DIAGNOSIS — T8189XA Other complications of procedures, not elsewhere classified, initial encounter: Secondary | ICD-10-CM

## 2012-06-29 NOTE — Progress Notes (Signed)
Subjective:     Patient ID: Brandon Schaefer, male   DOB: 30-Jul-1955, 57 y.o.   MRN: [id number]  HPI He is here for another wound check. He is still undergoing chemotherapy for his colon cancer. He denies fevers. His bowel movements are normal. He is still undergoing  daily dressing changes  Review of Systems     Objective:   Physical Exam On exam, there is a small open area in the lower midline which is still 2-3 inches deep. Cultures were obtained of the purulence. I then treated with silver nitrate and packed with dry gauze    Assessment:     Nonhealing surgical wound     Plan:     I will go ahead and resume doxycycline. I will call him back with his culture results and change antibiotics if necessary. I will see him back in the office in 2 weeks

## 2012-06-30 ENCOUNTER — Other Ambulatory Visit (HOSPITAL_BASED_OUTPATIENT_CLINIC_OR_DEPARTMENT_OTHER): Payer: 59 | Admitting: Lab

## 2012-06-30 ENCOUNTER — Ambulatory Visit (HOSPITAL_BASED_OUTPATIENT_CLINIC_OR_DEPARTMENT_OTHER): Payer: 59

## 2012-06-30 ENCOUNTER — Ambulatory Visit (HOSPITAL_BASED_OUTPATIENT_CLINIC_OR_DEPARTMENT_OTHER): Payer: 59 | Admitting: Medical

## 2012-06-30 VITALS — BP 127/50 | HR 96 | Temp 97.9°F | Resp 18 | Ht 72.0 in | Wt >= 6400 oz

## 2012-06-30 DIAGNOSIS — C187 Malignant neoplasm of sigmoid colon: Secondary | ICD-10-CM

## 2012-06-30 DIAGNOSIS — Z5111 Encounter for antineoplastic chemotherapy: Secondary | ICD-10-CM

## 2012-06-30 LAB — CBC WITH DIFFERENTIAL (CANCER CENTER ONLY)
BASO#: 0 10e3/uL (ref 0.0–0.2)
BASO%: 0.2 % (ref 0.0–2.0)
EOS%: 2.4 % (ref 0.0–7.0)
Eosinophils Absolute: 0.3 10e3/uL (ref 0.0–0.5)
HCT: 35.5 % — ABNORMAL LOW (ref 38.7–49.9)
HGB: 11.6 g/dL — ABNORMAL LOW (ref 13.0–17.1)
LYMPH#: 2 10e3/uL (ref 0.9–3.3)
LYMPH%: 16.7 % (ref 14.0–48.0)
MCH: 25.7 pg — ABNORMAL LOW (ref 28.0–33.4)
MCHC: 32.7 g/dL (ref 32.0–35.9)
MCV: 79 fL — ABNORMAL LOW (ref 82–98)
MONO#: 0.7 10e3/uL (ref 0.1–0.9)
MONO%: 5.8 % (ref 0.0–13.0)
NEUT#: 9.2 10e3/uL — ABNORMAL HIGH (ref 1.5–6.5)
NEUT%: 74.9 % (ref 40.0–80.0)
Platelets: 239 10e3/uL (ref 145–400)
RBC: 4.51 10e6/uL (ref 4.20–5.70)
RDW: 22.1 % — ABNORMAL HIGH (ref 11.1–15.7)
WBC: 12.2 10e3/uL — ABNORMAL HIGH (ref 4.0–10.0)

## 2012-06-30 LAB — COMPREHENSIVE METABOLIC PANEL
Albumin: 4.1 g/dL (ref 3.5–5.2)
CO2: 24 mEq/L (ref 19–32)
Calcium: 9.3 mg/dL (ref 8.4–10.5)
Chloride: 103 mEq/L (ref 96–112)
Glucose, Bld: 151 mg/dL — ABNORMAL HIGH (ref 70–99)
Sodium: 139 mEq/L (ref 135–145)
Total Bilirubin: 0.4 mg/dL (ref 0.3–1.2)
Total Protein: 6.6 g/dL (ref 6.0–8.3)

## 2012-06-30 MED ORDER — HEPARIN SOD (PORK) LOCK FLUSH 100 UNIT/ML IV SOLN
500.0000 [IU] | Freq: Once | INTRAVENOUS | Status: DC | PRN
  Filled 2012-06-30: qty 5

## 2012-06-30 MED ORDER — LEUCOVORIN CALCIUM INJECTION 350 MG
1240.0000 mg | Freq: Once | INTRAVENOUS | Status: AC
  Administered 2012-06-30: 1240 mg via INTRAVENOUS
  Filled 2012-06-30: qty 62

## 2012-06-30 MED ORDER — SODIUM CHLORIDE 0.9 % IV SOLN
1920.0000 mg/m2 | INTRAVENOUS | Status: DC
  Administered 2012-06-30: 5900 mg via INTRAVENOUS
  Filled 2012-06-30: qty 118

## 2012-06-30 MED ORDER — FLUOROURACIL CHEMO INJECTION 2.5 GM/50ML
400.0000 mg/m2 | Freq: Once | INTRAVENOUS | Status: AC
  Administered 2012-06-30: 1250 mg via INTRAVENOUS
  Filled 2012-06-30: qty 25

## 2012-06-30 MED ORDER — SODIUM CHLORIDE 0.9 % IJ SOLN
10.0000 mL | INTRAMUSCULAR | Status: DC | PRN
  Filled 2012-06-30: qty 10

## 2012-06-30 MED ORDER — DEXAMETHASONE SODIUM PHOSPHATE 4 MG/ML IJ SOLN
20.0000 mg | Freq: Once | INTRAMUSCULAR | Status: AC
  Administered 2012-06-30: 20 mg via INTRAVENOUS

## 2012-06-30 MED ORDER — ONDANSETRON 16 MG/50ML IVPB (CHCC)
16.0000 mg | Freq: Once | INTRAVENOUS | Status: AC
  Administered 2012-06-30: 16 mg via INTRAVENOUS

## 2012-06-30 MED ORDER — SODIUM CHLORIDE 0.9 % IV SOLN
Freq: Once | INTRAVENOUS | Status: AC
  Administered 2012-06-30: 11:00:00 via INTRAVENOUS

## 2012-06-30 NOTE — Progress Notes (Signed)
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  CURRENT THERAPY:  The patient is status post 6 cycles of infusional 5FU/leucovorin.  INTERIM HISTORY: Brandon Schaefer presents today for an office followup visit. Overall, he reports that he's been doing quite well.  He, reports, that he's not having any more issues with diarrhea since the Irinotecan has been discontinued.  He is now back on a normal bowel movement regimen.  Of note, we are just doing the infusional 5-FU over 2 days.  His abdominal wound is healing up slowly.  He still has a dressing on the lower part of the wound, but this appears to be doing better. he recently had a followup office visit with Dr. Magnus Ivan.  His wound was examined and he still has a small open area in the lower midline about 2-3 inches deep.  Of note, cultures were obtained.  These cultures are not back yet.   He does have some intermittent nausea for which he utilizes his anti-emetics.  He denies any active vomiting.  He denies any diarrhea, or constipation.  Any chest pain, shortness of breath, or cough.  He does have some dyspnea on exertion.  He denies any fevers, chills, or night sweats.  He denies any headaches, visual changes, or rashes.  He does have some lower leg swelling, which is chronic.  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  However, this is from diabetes.  He is now up and walking without a walker.  I did advise him that he needs to be more active with his walking. He is sedentary most of the time, and I do not want to see him with any type of thrombosis. Overall, his performance status is ECOG 2. His last CEA back on March 3rd was 1.7.  Review of Systems: Constitutional:Negative for malaise/fatigue, fever, chills, weight loss, diaphoresis, activity change, appetite change, and unexpected weight change.  HEENT: Negative for double vision, blurred vision, visual loss, ear pain, tinnitus, congestion, rhinorrhea, epistaxis sore throat or sinus  disease, oral pain/lesion, tongue soreness Respiratory: Negative for cough, chest tightness, shortness of breath, wheezing and stridor.  Cardiovascular: Negative for chest pain, palpitations, leg swelling, orthopnea, PND, DOE or claudication Gastrointestinal: Negative for nausea, vomiting, abdominal pain, diarrhea, constipation, blood in stool, melena, hematochezia, abdominal distention, anal bleeding, rectal pain, anorexia and hematemesis.  Genitourinary: Negative for dysuria, frequency, hematuria,  Musculoskeletal: Negative for myalgias, back pain, joint swelling, arthralgias and gait problem.  Skin: Negative for rash, color change, pallor and wound.  Neurological:. Negative for dizziness/light-headedness, tremors, seizures, syncope, facial asymmetry, speech difficulty, weakness, numbness, headaches and paresthesias.  Hematological: Negative for adenopathy. Does not bruise/bleed easily.  Psychiatric/Behavioral:  Negative for depression, no loss of interest in normal activity or change in sleep pattern.   Physical Exam: This is a morbidly, obese, 57 year old, white gentleman, in no obvious distress Vitals: Temperature 97.9 degrees, pulse 96, respirations 18, blood pressure 127/50 weight 403 pounds HEENT reveals a normocephalic, atraumatic skull, no scleral icterus, no oral lesions  Neck is supple without any cervical or supraclavicular adenopathy.  Lungs are clear to auscultation bilaterally. There are no wheezes, rales or rhonci Cardiac is regular rate and rhythm with a normal S1 and S2. There are no murmurs, rubs, or bruits.  Abdomen is soft with good bowel sounds, there is no palpable mass. There is no palpable hepatosplenomegaly. There is no palpable fluid wave.  Musculoskeletal no tenderness of the spine, ribs, or hips.  Extremities there  are no clubbing, cyanosis, or edema.  Skin no petechia, purpura or ecchymosis Neurologic is nonfocal.  Laboratory Data:  white count 12.2, hemoglobin  11.6, hematocrit 35.5.  Platelets 239,000   Assessment/Plan: This is a pleasant, 57 year old, white gentleman, with the following issues:  #1.  Stage II adenocarcinoma of the sigmoid colon.  We did send his tumor off for Oncotype score.  His score was fairly high at 36.  As, such, we felt that he would be a candidate for adjuvant chemotherapy.   #2.  Diarrhea.  This has now resolved since we have discontinued the Irinotecan.   #3.  Decreased activity level.  I did explain to Brandon Schaefer that he needs to get up and walk some throughout the day to help minimize his risk for any type of thrombosis.  #4.  Followup.  Brandon Schaefer will follow back up with [place] in 2 weeks, but before then should there be questions or concerns.

## 2012-06-30 NOTE — Patient Instructions (Addendum)
[facility] Discharge Instructions for Patients Receiving Chemotherapy  Today you received the following chemotherapy agents 5FU     To help prevent nausea and vomiting after your treatment, we encourage you to take your nausea medication Take this as prescribed If you develop nausea and vomiting that is not controlled by your nausea medication, call the clinic. If it is after clinic hours your family physician or the after hours number for the clinic or go to the Emergency Department.   BELOW ARE SYMPTOMS THAT SHOULD BE REPORTED IMMEDIATELY:  *FEVER GREATER THAN 100.5 F  *CHILLS WITH OR WITHOUT FEVER  NAUSEA AND VOMITING THAT IS NOT CONTROLLED WITH YOUR NAUSEA MEDICATION  *UNUSUAL SHORTNESS OF BREATH  *UNUSUAL BRUISING OR BLEEDING  TENDERNESS IN MOUTH AND THROAT WITH OR WITHOUT PRESENCE OF ULCERS  *URINARY PROBLEMS  *BOWEL PROBLEMS  UNUSUAL RASH Items with * indicate a potential emergency and should be followed up as soon as possible.  One of the nurses will contact you 24 hours after your treatment. Please let the nurse know about any problems that you may have experienced. Feel free to call the clinic you have any questions or concerns. The clinic phone number is [phone].   I have been informed and understand all the instructions given to me. I know to contact the clinic, my physician, or go to the Emergency Department if any problems should occur. I do not have any questions at this time, but understand that I may call the clinic during office hours   should I have any questions or need assistance in obtaining follow up care.    __________________________________________  _____________  __________ Signature of Patient or Authorized Representative            Date                   Time    __________________________________________ Nurse's Signature

## 2012-07-02 ENCOUNTER — Ambulatory Visit (HOSPITAL_BASED_OUTPATIENT_CLINIC_OR_DEPARTMENT_OTHER): Payer: 59

## 2012-07-02 VITALS — BP 139/83 | HR 90 | Temp 97.0°F

## 2012-07-02 DIAGNOSIS — Z452 Encounter for adjustment and management of vascular access device: Secondary | ICD-10-CM

## 2012-07-02 DIAGNOSIS — C187 Malignant neoplasm of sigmoid colon: Secondary | ICD-10-CM

## 2012-07-02 MED ORDER — SODIUM CHLORIDE 0.9 % IJ SOLN
10.0000 mL | INTRAMUSCULAR | Status: DC | PRN
  Administered 2012-07-02: 10 mL
  Filled 2012-07-02: qty 10

## 2012-07-02 MED ORDER — HEPARIN SOD (PORK) LOCK FLUSH 100 UNIT/ML IV SOLN
500.0000 [IU] | Freq: Once | INTRAVENOUS | Status: AC | PRN
  Administered 2012-07-02: 500 [IU]
  Filled 2012-07-02: qty 5

## 2012-07-02 NOTE — Patient Instructions (Signed)
[facility] Discharge Instructions for Patients Receiving Chemotherapy  Today you received the following chemotherapy agents 5FU  To help prevent nausea and vomiting after your treatment, we encourage you to take your nausea medication as prescribed.   If you develop nausea and vomiting that is not controlled by your nausea medication, call the clinic. If it is after clinic hours your family physician or the after hours number for the clinic or go to the Emergency Department.   BELOW ARE SYMPTOMS THAT SHOULD BE REPORTED IMMEDIATELY:  *FEVER GREATER THAN 100.5 F  *CHILLS WITH OR WITHOUT FEVER  NAUSEA AND VOMITING THAT IS NOT CONTROLLED WITH YOUR NAUSEA MEDICATION  *UNUSUAL SHORTNESS OF BREATH  *UNUSUAL BRUISING OR BLEEDING  TENDERNESS IN MOUTH AND THROAT WITH OR WITHOUT PRESENCE OF ULCERS  *URINARY PROBLEMS  *BOWEL PROBLEMS  UNUSUAL RASH Items with * indicate a potential emergency and should be followed up as soon as possible.  One of the nurses will contact you 24 hours after your treatment. Please let the nurse know about any problems that you may have experienced. Feel free to call the clinic you have any questions or concerns. The clinic phone number is [phone].   I have been informed and understand all the instructions given to me. I know to contact the clinic, my physician, or go to the Emergency Department if any problems should occur. I do not have any questions at this time, but understand that I may call the clinic during office hours   should I have any questions or need assistance in obtaining follow up care.    __________________________________________  _____________  __________ Signature of Patient or Authorized Representative            Date                   Time    __________________________________________ Nurse's Signature    

## 2012-07-13 ENCOUNTER — Encounter (INDEPENDENT_AMBULATORY_CARE_PROVIDER_SITE_OTHER): Payer: Self-pay | Admitting: Surgery

## 2012-07-13 ENCOUNTER — Ambulatory Visit (INDEPENDENT_AMBULATORY_CARE_PROVIDER_SITE_OTHER): Payer: 59 | Admitting: Surgery

## 2012-07-13 VITALS — BP 148/90 | HR 96 | Resp 22 | Ht 72.0 in | Wt >= 6400 oz

## 2012-07-13 DIAGNOSIS — T8189XD Other complications of procedures, not elsewhere classified, subsequent encounter: Secondary | ICD-10-CM

## 2012-07-13 DIAGNOSIS — T8189XA Other complications of procedures, not elsewhere classified, initial encounter: Secondary | ICD-10-CM

## 2012-07-13 NOTE — Progress Notes (Signed)
Subjective:     Patient ID: Brandon Schaefer, male   DOB: 1955/10/01, 57 y.o.   MRN: [id number]  HPI He is here for another visit. He reports of the wound drainage is less. He continues on doxycycline.  Review of Systems     Objective:   Physical Exam On exam, the drainage is much clearer. The wound remained deep    Assessment:     Nonhealing surgical wound.     Plan:     We will continue the local wound care and I will see him back in 3 weeks

## 2012-07-14 ENCOUNTER — Encounter: Payer: Self-pay | Admitting: Hematology & Oncology

## 2012-07-14 ENCOUNTER — Other Ambulatory Visit (HOSPITAL_BASED_OUTPATIENT_CLINIC_OR_DEPARTMENT_OTHER): Payer: 59 | Admitting: Lab

## 2012-07-14 ENCOUNTER — Ambulatory Visit (HOSPITAL_BASED_OUTPATIENT_CLINIC_OR_DEPARTMENT_OTHER): Payer: 59

## 2012-07-14 ENCOUNTER — Ambulatory Visit (HOSPITAL_BASED_OUTPATIENT_CLINIC_OR_DEPARTMENT_OTHER): Payer: 59 | Admitting: Medical

## 2012-07-14 VITALS — BP 144/54 | HR 106 | Temp 97.9°F | Resp 20 | Ht 72.0 in | Wt >= 6400 oz

## 2012-07-14 DIAGNOSIS — C187 Malignant neoplasm of sigmoid colon: Secondary | ICD-10-CM

## 2012-07-14 DIAGNOSIS — Z5111 Encounter for antineoplastic chemotherapy: Secondary | ICD-10-CM

## 2012-07-14 DIAGNOSIS — M7989 Other specified soft tissue disorders: Secondary | ICD-10-CM

## 2012-07-14 DIAGNOSIS — Z452 Encounter for adjustment and management of vascular access device: Secondary | ICD-10-CM

## 2012-07-14 DIAGNOSIS — E119 Type 2 diabetes mellitus without complications: Secondary | ICD-10-CM

## 2012-07-14 LAB — CEA: CEA: 1.7 ng/mL (ref 0.0–5.0)

## 2012-07-14 LAB — COMPREHENSIVE METABOLIC PANEL
ALT: 16 U/L (ref 0–53)
Alkaline Phosphatase: 78 U/L (ref 39–117)
Glucose, Bld: 220 mg/dL — ABNORMAL HIGH (ref 70–99)
Sodium: 137 mEq/L (ref 135–145)
Total Bilirubin: 0.5 mg/dL (ref 0.3–1.2)
Total Protein: 6.4 g/dL (ref 6.0–8.3)

## 2012-07-14 LAB — CBC WITH DIFFERENTIAL (CANCER CENTER ONLY)
BASO%: 0.3 % (ref 0.0–2.0)
EOS%: 2.8 % (ref 0.0–7.0)
LYMPH%: 19.1 % (ref 14.0–48.0)
MCH: 26.7 pg — ABNORMAL LOW (ref 28.0–33.4)
MCHC: 33.2 g/dL (ref 32.0–35.9)
MCV: 80 fL — ABNORMAL LOW (ref 82–98)
MONO%: 7.2 % (ref 0.0–13.0)
Platelets: 233 10*3/uL (ref 145–400)
RDW: 21 % — ABNORMAL HIGH (ref 11.1–15.7)

## 2012-07-14 MED ORDER — ALTEPLASE 2 MG IJ SOLR
2.0000 mg | Freq: Once | INTRAMUSCULAR | Status: AC | PRN
  Administered 2012-07-14: 2 mg
  Filled 2012-07-14: qty 2

## 2012-07-14 MED ORDER — HEPARIN SOD (PORK) LOCK FLUSH 100 UNIT/ML IV SOLN
250.0000 [IU] | Freq: Once | INTRAVENOUS | Status: DC | PRN
  Filled 2012-07-14: qty 5

## 2012-07-14 MED ORDER — FLUOROURACIL CHEMO INJECTION 2.5 GM/50ML
400.0000 mg/m2 | Freq: Once | INTRAVENOUS | Status: AC
  Administered 2012-07-14: 1250 mg via INTRAVENOUS
  Filled 2012-07-14: qty 25

## 2012-07-14 MED ORDER — SODIUM CHLORIDE 0.9 % IJ SOLN
3.0000 mL | INTRAMUSCULAR | Status: DC | PRN
  Filled 2012-07-14: qty 10

## 2012-07-14 MED ORDER — HEPARIN SOD (PORK) LOCK FLUSH 100 UNIT/ML IV SOLN
500.0000 [IU] | Freq: Once | INTRAVENOUS | Status: DC | PRN
  Filled 2012-07-14: qty 5

## 2012-07-14 MED ORDER — SODIUM CHLORIDE 0.9 % IJ SOLN
10.0000 mL | INTRAMUSCULAR | Status: DC | PRN
  Filled 2012-07-14: qty 10

## 2012-07-14 MED ORDER — ONDANSETRON 16 MG/50ML IVPB (CHCC)
16.0000 mg | Freq: Once | INTRAVENOUS | Status: AC
  Administered 2012-07-14: 16 mg via INTRAVENOUS

## 2012-07-14 MED ORDER — SODIUM CHLORIDE 0.9 % IV SOLN
Freq: Once | INTRAVENOUS | Status: AC
  Administered 2012-07-14: 09:00:00 via INTRAVENOUS

## 2012-07-14 MED ORDER — SODIUM CHLORIDE 0.9 % IV SOLN
1920.0000 mg/m2 | INTRAVENOUS | Status: DC
  Administered 2012-07-14: 5900 mg via INTRAVENOUS
  Filled 2012-07-14: qty 118

## 2012-07-14 MED ORDER — DEXAMETHASONE SODIUM PHOSPHATE 4 MG/ML IJ SOLN
20.0000 mg | Freq: Once | INTRAMUSCULAR | Status: AC
  Administered 2012-07-14: 20 mg via INTRAVENOUS

## 2012-07-14 MED ORDER — LEUCOVORIN CALCIUM INJECTION 350 MG
402.5000 mg/m2 | Freq: Once | INTRAVENOUS | Status: AC
  Administered 2012-07-14: 1240 mg via INTRAVENOUS
  Filled 2012-07-14: qty 62

## 2012-07-14 NOTE — Progress Notes (Signed)
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  CURRENT THERAPY:  The patient is status post 7 cycles of infusional 5FU/leucovorin.  INTERIM HISTORY: Brandon Schaefer presents today for an office followup visit. Overall, he reports that he's been doing quite well.  He, reports, that he's not having any more issues with diarrhea since the Irinotecan has been discontinued.  He is now back on a normal bowel movement regimen.  Of note, we are just doing the infusional 5-FU over 2 days.  His abdominal wound is healing up slowly.  He still has a dressing on the lower part of the wound, but this appears to be doing better. he recently had a followup office visit with Dr. Magnus Ivan. He remains on Doxycycline. He does have some intermittent nausea in the mornings, and utilizes his anti-emetics.  He denies any active vomiting.  He denies any diarrhea, or constipation.  Any chest pain, shortness of breath, or cough.  He does have some dyspnea on exertion.  He denies any fevers, chills, or night sweats.  He denies any headaches, visual changes, or rashes.  He does have some lower leg swelling, which is chronic.  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  However, this is from diabetes.  He is now up and walking without a walker.  I did advise him that he needs to be more active with his walking. He is sedentary most of the time, and I do not want to see him with any type of thrombosis. Overall, his performance status is ECOG 2. His last CEA back on March 3rd was 1.7.  Review of Systems: Constitutional:Negative for malaise/fatigue, fever, chills, weight loss, diaphoresis, activity change, appetite change, and unexpected weight change.  HEENT: Negative for double vision, blurred vision, visual loss, ear pain, tinnitus, congestion, rhinorrhea, epistaxis sore throat or sinus disease, oral pain/lesion, tongue soreness Respiratory: Negative for cough, chest tightness, shortness of breath, wheezing and stridor.   Cardiovascular: Negative for chest pain, palpitations, leg swelling, orthopnea, PND, DOE or claudication Gastrointestinal: Negative for nausea, vomiting, abdominal pain, diarrhea, constipation, blood in stool, melena, hematochezia, abdominal distention, anal bleeding, rectal pain, anorexia and hematemesis.  Genitourinary: Negative for dysuria, frequency, hematuria,  Musculoskeletal: Negative for myalgias, back pain, joint swelling, arthralgias and gait problem.  Skin: Negative for rash, color change, pallor and wound.  Neurological:. Negative for dizziness/light-headedness, tremors, seizures, syncope, facial asymmetry, speech difficulty, weakness, numbness, headaches and paresthesias.  Hematological: Negative for adenopathy. Does not bruise/bleed easily.  Psychiatric/Behavioral:  Negative for depression, no loss of interest in normal activity or change in sleep pattern.   Physical Exam: This is a morbidly, obese, 57 year old, white gentleman, in no obvious distress Vitals: Temperature 97.9 degrees, pulse 96, respirations 18, blood pressure 127/50 weight 403 pounds HEENT reveals a normocephalic, atraumatic skull, no scleral icterus, no oral lesions  Neck is supple without any cervical or supraclavicular adenopathy.  Lungs are clear to auscultation bilaterally. There are no wheezes, rales or rhonci Cardiac is regular rate and rhythm with a normal S1 and S2. There are no murmurs, rubs, or bruits.  Abdomen is soft with good bowel sounds, there is no palpable mass. There is no palpable hepatosplenomegaly. There is no palpable fluid wave.  Musculoskeletal no tenderness of the spine, ribs, or hips.  Extremities there are no clubbing, cyanosis, or edema.  Skin no petechia, purpura or ecchymosis Neurologic is nonfocal.  Laboratory Data: White count 10.1, hemoglobin 1.9, hematocrit 35.8, platelets 233,000  Assessment/Plan: This is a  pleasant, 57 year old, white gentleman, with the following  issues:  #1.  Stage II adenocarcinoma of the sigmoid colon.  We did send his tumor off for Oncotype score.  His score was fairly high at 36.  As, such, we felt that he would be a candidate for adjuvant chemotherapy.  He will go ahead with his eighth cycle of treatment today.  #2.  Diarrhea.  This has now resolved since we have discontinued the Irinotecan.   #3.  Decreased activity level.  I did explain to Brandon Schaefer that he needs to get up and walk some throughout the day to help minimize his risk for any type of thrombosis.  #4.  Followup.  Brandon Schaefer will follow back up with [place] in 2 weeks, but before then should there be questions or concerns.

## 2012-07-14 NOTE — Patient Instructions (Signed)
[facility] Discharge Instructions for Patients Receiving Chemotherapy  Today you received the following chemotherapy agents 5FU  To help prevent nausea and vomiting after your treatment, we encourage you to take your nausea medication as prescribed.   If you develop nausea and vomiting that is not controlled by your nausea medication, call the clinic. If it is after clinic hours your family physician or the after hours number for the clinic or go to the Emergency Department.   BELOW ARE SYMPTOMS THAT SHOULD BE REPORTED IMMEDIATELY:  *FEVER GREATER THAN 100.5 F  *CHILLS WITH OR WITHOUT FEVER  NAUSEA AND VOMITING THAT IS NOT CONTROLLED WITH YOUR NAUSEA MEDICATION  *UNUSUAL SHORTNESS OF BREATH  *UNUSUAL BRUISING OR BLEEDING  TENDERNESS IN MOUTH AND THROAT WITH OR WITHOUT PRESENCE OF ULCERS  *URINARY PROBLEMS  *BOWEL PROBLEMS  UNUSUAL RASH Items with * indicate a potential emergency and should be followed up as soon as possible.  One of the nurses will contact you 24 hours after your treatment. Please let the nurse know about any problems that you may have experienced. Feel free to call the clinic you have any questions or concerns. The clinic phone number is [phone].   I have been informed and understand all the instructions given to me. I know to contact the clinic, my physician, or go to the Emergency Department if any problems should occur. I do not have any questions at this time, but understand that I may call the clinic during office hours   should I have any questions or need assistance in obtaining follow up care.    __________________________________________  _____________  __________ Signature of Patient or Authorized Representative            Date                   Time    __________________________________________ Nurse's Signature

## 2012-07-16 ENCOUNTER — Ambulatory Visit (HOSPITAL_BASED_OUTPATIENT_CLINIC_OR_DEPARTMENT_OTHER): Payer: 59

## 2012-07-16 VITALS — BP 135/73 | Temp 98.0°F | Resp 20

## 2012-07-16 DIAGNOSIS — C187 Malignant neoplasm of sigmoid colon: Secondary | ICD-10-CM

## 2012-07-16 DIAGNOSIS — Z452 Encounter for adjustment and management of vascular access device: Secondary | ICD-10-CM

## 2012-07-16 MED ORDER — SODIUM CHLORIDE 0.9 % IJ SOLN
10.0000 mL | INTRAMUSCULAR | Status: DC | PRN
  Administered 2012-07-16: 10 mL
  Filled 2012-07-16: qty 10

## 2012-07-16 MED ORDER — HEPARIN SOD (PORK) LOCK FLUSH 100 UNIT/ML IV SOLN
500.0000 [IU] | Freq: Once | INTRAVENOUS | Status: AC | PRN
  Administered 2012-07-16: 500 [IU]
  Filled 2012-07-16: qty 5

## 2012-07-16 NOTE — Patient Instructions (Signed)

## 2012-07-19 ENCOUNTER — Other Ambulatory Visit (INDEPENDENT_AMBULATORY_CARE_PROVIDER_SITE_OTHER): Payer: Self-pay | Admitting: Surgery

## 2012-07-28 ENCOUNTER — Ambulatory Visit: Payer: 59

## 2012-07-28 ENCOUNTER — Other Ambulatory Visit (HOSPITAL_BASED_OUTPATIENT_CLINIC_OR_DEPARTMENT_OTHER): Payer: 59 | Admitting: Lab

## 2012-07-28 ENCOUNTER — Other Ambulatory Visit: Payer: 59 | Admitting: Lab

## 2012-07-28 ENCOUNTER — Ambulatory Visit (HOSPITAL_BASED_OUTPATIENT_CLINIC_OR_DEPARTMENT_OTHER): Payer: 59

## 2012-07-28 ENCOUNTER — Ambulatory Visit (HOSPITAL_BASED_OUTPATIENT_CLINIC_OR_DEPARTMENT_OTHER): Payer: 59 | Admitting: Hematology & Oncology

## 2012-07-28 VITALS — BP 116/49 | HR 95 | Temp 98.5°F | Resp 20 | Ht 72.0 in | Wt >= 6400 oz

## 2012-07-28 DIAGNOSIS — C187 Malignant neoplasm of sigmoid colon: Secondary | ICD-10-CM

## 2012-07-28 DIAGNOSIS — Z5111 Encounter for antineoplastic chemotherapy: Secondary | ICD-10-CM

## 2012-07-28 LAB — CBC WITH DIFFERENTIAL (CANCER CENTER ONLY)
BASO%: 0.3 % (ref 0.0–2.0)
LYMPH%: 14.6 % (ref 14.0–48.0)
MCV: 83 fL (ref 82–98)
MONO#: 0.6 10*3/uL (ref 0.1–0.9)
NEUT#: 8.7 10*3/uL — ABNORMAL HIGH (ref 1.5–6.5)
Platelets: 201 10*3/uL (ref 145–400)
RDW: 19.7 % — ABNORMAL HIGH (ref 11.1–15.7)
WBC: 11.2 10*3/uL — ABNORMAL HIGH (ref 4.0–10.0)

## 2012-07-28 LAB — COMPREHENSIVE METABOLIC PANEL
ALT: 15 U/L (ref 0–53)
Alkaline Phosphatase: 68 U/L (ref 39–117)
CO2: 23 mEq/L (ref 19–32)
Potassium: 3.6 mEq/L (ref 3.5–5.3)
Sodium: 138 mEq/L (ref 135–145)
Total Bilirubin: 0.5 mg/dL (ref 0.3–1.2)
Total Protein: 6.1 g/dL (ref 6.0–8.3)

## 2012-07-28 LAB — CEA: CEA: 1.6 ng/mL (ref 0.0–5.0)

## 2012-07-28 MED ORDER — SODIUM CHLORIDE 0.9 % IJ SOLN
10.0000 mL | INTRAMUSCULAR | Status: DC | PRN
  Filled 2012-07-28: qty 10

## 2012-07-28 MED ORDER — SODIUM CHLORIDE 0.9 % IV SOLN
1920.0000 mg/m2 | INTRAVENOUS | Status: DC
  Administered 2012-07-28: 5900 mg via INTRAVENOUS
  Filled 2012-07-28: qty 118

## 2012-07-28 MED ORDER — LEUCOVORIN CALCIUM INJECTION 350 MG
400.0000 mg/m2 | Freq: Once | INTRAVENOUS | Status: AC
  Administered 2012-07-28: 1232 mg via INTRAVENOUS
  Filled 2012-07-28: qty 61.6

## 2012-07-28 MED ORDER — FLUOROURACIL CHEMO INJECTION 2.5 GM/50ML
400.0000 mg/m2 | Freq: Once | INTRAVENOUS | Status: AC
  Administered 2012-07-28: 1250 mg via INTRAVENOUS
  Filled 2012-07-28: qty 25

## 2012-07-28 MED ORDER — HEPARIN SOD (PORK) LOCK FLUSH 100 UNIT/ML IV SOLN
500.0000 [IU] | Freq: Once | INTRAVENOUS | Status: DC | PRN
  Filled 2012-07-28: qty 5

## 2012-07-28 MED ORDER — DEXAMETHASONE SODIUM PHOSPHATE 4 MG/ML IJ SOLN
20.0000 mg | Freq: Once | INTRAMUSCULAR | Status: AC
  Administered 2012-07-28: 20 mg via INTRAVENOUS

## 2012-07-28 MED ORDER — ONDANSETRON 16 MG/50ML IVPB (CHCC)
16.0000 mg | Freq: Once | INTRAVENOUS | Status: AC
  Administered 2012-07-28: 16 mg via INTRAVENOUS

## 2012-07-28 MED ORDER — SODIUM CHLORIDE 0.9 % IV SOLN
Freq: Once | INTRAVENOUS | Status: AC
  Administered 2012-07-28: 09:00:00 via INTRAVENOUS

## 2012-07-28 NOTE — Patient Instructions (Signed)
[facility] Discharge Instructions for Patients Receiving Chemotherapy  Today you received the following chemotherapy agents 5FU  To help prevent nausea and vomiting after your treatment, we encourage you to take your nausea medication as prescribed.   If you develop nausea and vomiting that is not controlled by your nausea medication, call the clinic. If it is after clinic hours your family physician or the after hours number for the clinic or go to the Emergency Department.   BELOW ARE SYMPTOMS THAT SHOULD BE REPORTED IMMEDIATELY:  *FEVER GREATER THAN 100.5 F  *CHILLS WITH OR WITHOUT FEVER  NAUSEA AND VOMITING THAT IS NOT CONTROLLED WITH YOUR NAUSEA MEDICATION  *UNUSUAL SHORTNESS OF BREATH  *UNUSUAL BRUISING OR BLEEDING  TENDERNESS IN MOUTH AND THROAT WITH OR WITHOUT PRESENCE OF ULCERS  *URINARY PROBLEMS  *BOWEL PROBLEMS  UNUSUAL RASH Items with * indicate a potential emergency and should be followed up as soon as possible.  One of the nurses will contact you 24 hours after your treatment. Please let the nurse know about any problems that you may have experienced. Feel free to call the clinic you have any questions or concerns. The clinic phone number is [phone].   I have been informed and understand all the instructions given to me. I know to contact the clinic, my physician, or go to the Emergency Department if any problems should occur. I do not have any questions at this time, but understand that I may call the clinic during office hours   should I have any questions or need assistance in obtaining follow up care.    __________________________________________  _____________  __________ Signature of Patient or Authorized Representative            Date                   Time    __________________________________________ Nurse's Signature    

## 2012-07-28 NOTE — Progress Notes (Signed)
This office note has been dictated.

## 2012-07-29 NOTE — Progress Notes (Signed)
CC:   [name], M.D. Madolyn Frieze Jens Som, MD, [facility] [name], M.D.  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  CURRENT THERAPY:  The patient is status post 8 cycles of infusional 5FU/leucovorin.  INTERIM HISTORY:  Mr. Brandon Schaefer comes in for his followup.  He is actually looking quite good.  He has really done well with chemotherapy.  He has had no problems with nausea and vomiting.  He does feel tired.  His abdominal wound still has not healed up completely.  He is going to have to go back to see the surgeon and may need surgery after he gets done with all of his chemotherapy.  He has had no problems with diarrhea.  He has had no urinary issues.  There has been no cough or shortness of breath.  He does have some "burning" in his legs.  This is more chronic.  His last CEA was 1.7 that we checked 2 weeks ago.  PHYSICAL EXAMINATION:  General:  This is an obese white gentleman in no obvious distress.  Vital signs:  Temperature of 98.5, pulse 95, respiratory rate 16, blood pressure is 116/49.  Weight is 400 pounds. Head and neck:  Normocephalic, atraumatic skull.  There are no ocular or oral lesions.  There are no palpable cervical or supraclavicular lymph nodes.  Lungs:  Clear bilaterally.  Cardiac:  Regular rate and rhythm, with a normal S1, S2.  There are no murmurs, rubs or bruits.  Abdomen: Soft, with good bowel sounds.  There is no palpable abdominal mass.  His laparotomy scar is healing, although there is still a dressing on the lower part of the wound.  There is no fluid wave.  There is no palpable hepatosplenomegaly.  Back:  No tenderness over the spine, ribs, or hips. Extremities:  Show some mild stasis dermatitis changes in the lower legs.  He has no cellulitis.  He has decent range of motion of his joints.  LABORATORY STUDIES:  White cell count is 11.2, hemoglobin 11.5, hematocrit 34.8, platelet count 201,000.  IMPRESSION:  Mr. 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  I would consider him high-risk stage II.  As such, we are treating him with adjuvant chemotherapy.  We will go ahead and get him back in 2 more weeks for his 10th cycle.  I told Mr. Culbertson that once we got done with his treatments, I would wait 4-6 weeks and then repeat his CT scan as a baseline.    ______________________________ Josph Macho, M.D. PRE/MEDQ  D:  07/28/2012  T:  07/29/2012  Job:  [id number]

## 2012-07-30 ENCOUNTER — Ambulatory Visit (HOSPITAL_BASED_OUTPATIENT_CLINIC_OR_DEPARTMENT_OTHER): Payer: 59

## 2012-07-30 VITALS — BP 113/73 | HR 78 | Temp 96.8°F | Resp 20

## 2012-07-30 DIAGNOSIS — Z452 Encounter for adjustment and management of vascular access device: Secondary | ICD-10-CM

## 2012-07-30 DIAGNOSIS — C187 Malignant neoplasm of sigmoid colon: Secondary | ICD-10-CM

## 2012-07-30 MED ORDER — HEPARIN SOD (PORK) LOCK FLUSH 100 UNIT/ML IV SOLN
500.0000 [IU] | Freq: Once | INTRAVENOUS | Status: DC | PRN
  Filled 2012-07-30: qty 5

## 2012-07-30 MED ORDER — SODIUM CHLORIDE 0.9 % IJ SOLN
10.0000 mL | INTRAMUSCULAR | Status: DC | PRN
  Filled 2012-07-30: qty 10

## 2012-07-30 NOTE — Progress Notes (Signed)
Brandon Schaefer presented for Portacath access and flush. Proper placement of portacath confirmed by CXR. Portacath located right chest wall accessed with  H 20 needle. Good blood return present. Portacath flushed with 20ml NS and 500U/72ml Heparin and needle removed intact. Procedure without incident. Patient tolerated procedure well.

## 2012-08-04 ENCOUNTER — Ambulatory Visit (INDEPENDENT_AMBULATORY_CARE_PROVIDER_SITE_OTHER): Payer: 59 | Admitting: Surgery

## 2012-08-04 ENCOUNTER — Encounter (INDEPENDENT_AMBULATORY_CARE_PROVIDER_SITE_OTHER): Payer: Self-pay | Admitting: Surgery

## 2012-08-04 VITALS — BP 128/88 | HR 72 | Temp 98.6°F | Resp 16 | Ht 73.0 in | Wt 398.8 lb

## 2012-08-04 DIAGNOSIS — T8189XD Other complications of procedures, not elsewhere classified, subsequent encounter: Secondary | ICD-10-CM

## 2012-08-04 DIAGNOSIS — Z5189 Encounter for other specified aftercare: Secondary | ICD-10-CM

## 2012-08-04 NOTE — Progress Notes (Signed)
Subjective:     Patient ID: Brandon Schaefer, male   DOB: [birth year], 57 y.o.   MRN: [id number]  HPI He is still undergoing chemotherapy. He has had increase in the drainage from the lower wound. He has been continuing on doxycycline  Review of Systems     Objective:   Physical Exam The wound remained stable his obese abdominal wall. There is still a moderate amount of purulence. I treated with silver nitrate    Assessment:     Nonhealing surgical wound     Plan:     I will change him to Bactrim and see him back in 3 weeks

## 2012-08-11 ENCOUNTER — Other Ambulatory Visit: Payer: 59 | Admitting: Lab

## 2012-08-11 ENCOUNTER — Ambulatory Visit (HOSPITAL_BASED_OUTPATIENT_CLINIC_OR_DEPARTMENT_OTHER): Payer: 59 | Admitting: Medical

## 2012-08-11 ENCOUNTER — Ambulatory Visit (HOSPITAL_BASED_OUTPATIENT_CLINIC_OR_DEPARTMENT_OTHER): Payer: 59

## 2012-08-11 ENCOUNTER — Other Ambulatory Visit (HOSPITAL_BASED_OUTPATIENT_CLINIC_OR_DEPARTMENT_OTHER): Payer: 59 | Admitting: Lab

## 2012-08-11 ENCOUNTER — Ambulatory Visit: Payer: 59

## 2012-08-11 VITALS — BP 132/49 | HR 84 | Temp 98.1°F | Ht 72.0 in | Wt >= 6400 oz

## 2012-08-11 DIAGNOSIS — C187 Malignant neoplasm of sigmoid colon: Secondary | ICD-10-CM

## 2012-08-11 DIAGNOSIS — Z5111 Encounter for antineoplastic chemotherapy: Secondary | ICD-10-CM

## 2012-08-11 LAB — CBC WITH DIFFERENTIAL (CANCER CENTER ONLY)
BASO#: 0 10*3/uL (ref 0.0–0.2)
Eosinophils Absolute: 0.3 10*3/uL (ref 0.0–0.5)
HCT: 37 % — ABNORMAL LOW (ref 38.7–49.9)
HGB: 12 g/dL — ABNORMAL LOW (ref 13.0–17.1)
LYMPH#: 1.7 10*3/uL (ref 0.9–3.3)
MCHC: 32.4 g/dL (ref 32.0–35.9)
NEUT#: 5.6 10*3/uL (ref 1.5–6.5)
NEUT%: 68.1 % (ref 40.0–80.0)
RBC: 4.29 10*6/uL (ref 4.20–5.70)

## 2012-08-11 LAB — CMP (CANCER CENTER ONLY)
Albumin: 3.4 g/dL (ref 3.3–5.5)
BUN, Bld: 14 mg/dL (ref 7–22)
CO2: 27 mEq/L (ref 18–33)
Calcium: 9.2 mg/dL (ref 8.0–10.3)
Chloride: 98 mEq/L (ref 98–108)
Creat: 0.9 mg/dl (ref 0.6–1.2)
Glucose, Bld: 191 mg/dL — ABNORMAL HIGH (ref 73–118)
Potassium: 4.6 mEq/L (ref 3.3–4.7)

## 2012-08-11 MED ORDER — ALTEPLASE 2 MG IJ SOLR
2.0000 mg | Freq: Once | INTRAMUSCULAR | Status: DC | PRN
  Filled 2012-08-11: qty 2

## 2012-08-11 MED ORDER — FLUOROURACIL CHEMO INJECTION 2.5 GM/50ML
400.0000 mg/m2 | Freq: Once | INTRAVENOUS | Status: AC
  Administered 2012-08-11: 1250 mg via INTRAVENOUS
  Filled 2012-08-11: qty 25

## 2012-08-11 MED ORDER — SODIUM CHLORIDE 0.9 % IV SOLN
1920.0000 mg/m2 | INTRAVENOUS | Status: DC
  Administered 2012-08-11: 5900 mg via INTRAVENOUS
  Filled 2012-08-11: qty 118

## 2012-08-11 MED ORDER — SODIUM CHLORIDE 0.9 % IJ SOLN
10.0000 mL | INTRAMUSCULAR | Status: DC | PRN
  Filled 2012-08-11: qty 10

## 2012-08-11 MED ORDER — SODIUM CHLORIDE 0.9 % IJ SOLN
3.0000 mL | INTRAMUSCULAR | Status: DC | PRN
  Filled 2012-08-11: qty 10

## 2012-08-11 MED ORDER — HEPARIN SOD (PORK) LOCK FLUSH 100 UNIT/ML IV SOLN
250.0000 [IU] | Freq: Once | INTRAVENOUS | Status: DC | PRN
  Filled 2012-08-11: qty 5

## 2012-08-11 MED ORDER — LEUCOVORIN CALCIUM INJECTION 350 MG
400.0000 mg/m2 | Freq: Once | INTRAVENOUS | Status: AC
  Administered 2012-08-11: 1232 mg via INTRAVENOUS
  Filled 2012-08-11: qty 61.6

## 2012-08-11 MED ORDER — ONDANSETRON 16 MG/50ML IVPB (CHCC)
16.0000 mg | Freq: Once | INTRAVENOUS | Status: AC
  Administered 2012-08-11: 16 mg via INTRAVENOUS

## 2012-08-11 MED ORDER — SODIUM CHLORIDE 0.9 % IV SOLN
Freq: Once | INTRAVENOUS | Status: AC
  Administered 2012-08-11: 10:00:00 via INTRAVENOUS

## 2012-08-11 MED ORDER — DEXAMETHASONE SODIUM PHOSPHATE 4 MG/ML IJ SOLN
20.0000 mg | Freq: Once | INTRAMUSCULAR | Status: AC
  Administered 2012-08-11: 20 mg via INTRAVENOUS

## 2012-08-11 MED ORDER — HEPARIN SOD (PORK) LOCK FLUSH 100 UNIT/ML IV SOLN
500.0000 [IU] | Freq: Once | INTRAVENOUS | Status: DC | PRN
  Filled 2012-08-11: qty 5

## 2012-08-11 NOTE — Patient Instructions (Addendum)
[facility] Discharge Instructions for Patients Receiving Chemotherapy  Today you received the following chemotherapy agents 5FU  To help prevent nausea and vomiting after your treatment, we encourage you to take your nausea medication as prescribed.   If you develop nausea and vomiting that is not controlled by your nausea medication, call the clinic. If it is after clinic hours your family physician or the after hours number for the clinic or go to the Emergency Department.   BELOW ARE SYMPTOMS THAT SHOULD BE REPORTED IMMEDIATELY:  *FEVER GREATER THAN 100.5 F  *CHILLS WITH OR WITHOUT FEVER  NAUSEA AND VOMITING THAT IS NOT CONTROLLED WITH YOUR NAUSEA MEDICATION  *UNUSUAL SHORTNESS OF BREATH  *UNUSUAL BRUISING OR BLEEDING  TENDERNESS IN MOUTH AND THROAT WITH OR WITHOUT PRESENCE OF ULCERS  *URINARY PROBLEMS  *BOWEL PROBLEMS  UNUSUAL RASH Items with * indicate a potential emergency and should be followed up as soon as possible.  One of the nurses will contact you 24 hours after your treatment. Please let the nurse know about any problems that you may have experienced. Feel free to call the clinic you have any questions or concerns. The clinic phone number is [phone].   I have been informed and understand all the instructions given to me. I know to contact the clinic, my physician, or go to the Emergency Department if any problems should occur. I do not have any questions at this time, but understand that I may call the clinic during office hours   should I have any questions or need assistance in obtaining follow up care.    __________________________________________  _____________  __________ Signature of Patient or Authorized Representative            Date                   Time    __________________________________________ Nurse's Signature    

## 2012-08-11 NOTE — Progress Notes (Signed)
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  CURRENT THERAPY:  The patient is status post 9 cycles of infusional 5FU/leucovorin.  INTERIM HISTORY: Brandon Schaefer presents today for an office followup visit. Overall, he reports that he's been doing quite well.  He, reports, that he's not having any more issues with diarrhea since the Irinotecan has been discontinued.  He is now back on a normal bowel movement regimen. His abdominal wound is healing up slowly.  He still has a dressing on the lower part of the wound, but this appears to be doing better. he recently had a followup office visit with Dr. Magnus Ivan. He remains on Doxycycline. He does have some intermittent nausea in the mornings, and utilizes his anti-emetics.  He denies any active vomiting.  He denies any diarrhea, or constipation.  Any chest pain, shortness of breath, or cough.  He does have some dyspnea on exertion.  He denies any fevers, chills, or night sweats.  He denies any headaches, visual changes, or rashes.  He does have some lower leg swelling, which is chronic.  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  However, this is from diabetes.  He is now up and walking without a walker.  I did advise him that he needs to be more active with his walking. He is sedentary most of the time, and I do not want to see him with any type of thrombosis. Overall, his performance status is ECOG 2. His last CEA back on 07/28/12 was 1.6.  Review of Systems: Constitutional:Negative for malaise/fatigue, fever, chills, weight loss, diaphoresis, activity change, appetite change, and unexpected weight change.  HEENT: Negative for double vision, blurred vision, visual loss, ear pain, tinnitus, congestion, rhinorrhea, epistaxis sore throat or sinus disease, oral pain/lesion, tongue soreness Respiratory: Negative for cough, chest tightness, shortness of breath, wheezing and stridor.  Cardiovascular: Negative for chest pain, palpitations, leg swelling,  orthopnea, PND, DOE or claudication Gastrointestinal: Negative for nausea, vomiting, abdominal pain, diarrhea, constipation, blood in stool, melena, hematochezia, abdominal distention, anal bleeding, rectal pain, anorexia and hematemesis.  Genitourinary: Negative for dysuria, frequency, hematuria,  Musculoskeletal: Negative for myalgias, back pain, joint swelling, arthralgias and gait problem.  Skin: Negative for rash, color change, pallor and wound.  Neurological:. Negative for dizziness/light-headedness, tremors, seizures, syncope, facial asymmetry, speech difficulty, weakness, numbness, headaches and paresthesias.  Hematological: Negative for adenopathy. Does not bruise/bleed easily.  Psychiatric/Behavioral:  Negative for depression, no loss of interest in normal activity or change in sleep pattern.   Physical Exam: This is a morbidly, obese, 57 year old, white gentleman, in no obvious distress Vitals: Temperature 98.1 degrees, pulse 84, respirations 20, blood pressure 132/49, weight 402 pounds HEENT reveals a normocephalic, atraumatic skull, no scleral icterus, no oral lesions  Neck is supple without any cervical or supraclavicular adenopathy.  Lungs are clear to auscultation bilaterally. There are no wheezes, rales or rhonci Cardiac is regular rate and rhythm with a normal S1 and S2. There are no murmurs, rubs, or bruits.  Abdomen is soft with good bowel sounds, there is no palpable mass. There is no palpable hepatosplenomegaly. There is no palpable fluid wave.  Musculoskeletal no tenderness of the spine, ribs, or hips.  Extremities there are no clubbing, cyanosis, or edema.  Skin no petechia, purpura or ecchymosis Neurologic is nonfocal.  Laboratory Data: White count 8.3, hemoglobin 12.0, hematocrit 37.0, platelets 216,000  Assessment/Plan: This is a pleasant, 57 year old, white gentleman, with the following issues:  #1.  Stage II adenocarcinoma of  the sigmoid colon.  We did send his  tumor off for Oncotype score.  His score was fairly high at 36.  As, such, we felt that he would be a candidate for adjuvant chemotherapy.  He will go ahead with his tenth cycle of treatment today. The plan is to repeat a CT scan on him once he's done with treatment.  We will wait 4-6 weeks out from his chemotherapy.    #2.  Diarrhea.  This has now resolved since we have discontinued the Irinotecan.   #3.  Decreased activity level.  I did explain to Brandon Schaefer that he needs to get up and walk some throughout the day to help minimize his risk for any type of thrombosis.  #4.  Followup.  Brandon Schaefer will follow back up with [place] in 2 weeks, but before then should there be questions or concerns.

## 2012-08-13 ENCOUNTER — Encounter (INDEPENDENT_AMBULATORY_CARE_PROVIDER_SITE_OTHER): Payer: Self-pay

## 2012-08-13 ENCOUNTER — Ambulatory Visit (HOSPITAL_BASED_OUTPATIENT_CLINIC_OR_DEPARTMENT_OTHER): Payer: 59

## 2012-08-13 VITALS — BP 118/73 | HR 93 | Temp 96.8°F

## 2012-08-13 DIAGNOSIS — C187 Malignant neoplasm of sigmoid colon: Secondary | ICD-10-CM

## 2012-08-13 DIAGNOSIS — Z452 Encounter for adjustment and management of vascular access device: Secondary | ICD-10-CM

## 2012-08-13 MED ORDER — HEPARIN SOD (PORK) LOCK FLUSH 100 UNIT/ML IV SOLN
500.0000 [IU] | Freq: Once | INTRAVENOUS | Status: AC | PRN
  Administered 2012-08-13: 500 [IU]
  Filled 2012-08-13: qty 5

## 2012-08-13 MED ORDER — SODIUM CHLORIDE 0.9 % IJ SOLN
10.0000 mL | INTRAMUSCULAR | Status: DC | PRN
  Administered 2012-08-13: 10 mL
  Filled 2012-08-13: qty 10

## 2012-08-13 NOTE — Patient Instructions (Signed)

## 2012-08-25 ENCOUNTER — Other Ambulatory Visit: Payer: Self-pay | Admitting: *Deleted

## 2012-08-25 ENCOUNTER — Other Ambulatory Visit (HOSPITAL_BASED_OUTPATIENT_CLINIC_OR_DEPARTMENT_OTHER): Payer: 59 | Admitting: Lab

## 2012-08-25 ENCOUNTER — Ambulatory Visit (HOSPITAL_BASED_OUTPATIENT_CLINIC_OR_DEPARTMENT_OTHER): Payer: 59

## 2012-08-25 ENCOUNTER — Ambulatory Visit (HOSPITAL_BASED_OUTPATIENT_CLINIC_OR_DEPARTMENT_OTHER): Payer: 59 | Admitting: Hematology & Oncology

## 2012-08-25 VITALS — BP 112/53 | HR 103 | Temp 98.1°F | Resp 18 | Ht 70.0 in | Wt 389.0 lb

## 2012-08-25 DIAGNOSIS — C187 Malignant neoplasm of sigmoid colon: Secondary | ICD-10-CM

## 2012-08-25 DIAGNOSIS — Z5111 Encounter for antineoplastic chemotherapy: Secondary | ICD-10-CM

## 2012-08-25 LAB — COMPREHENSIVE METABOLIC PANEL
ALT: 16 U/L (ref 0–53)
Albumin: 4.2 g/dL (ref 3.5–5.2)
BUN: 16 mg/dL (ref 6–23)
CO2: 18 mEq/L — ABNORMAL LOW (ref 19–32)
Calcium: 9.2 mg/dL (ref 8.4–10.5)
Chloride: 105 mEq/L (ref 96–112)
Creatinine, Ser: 1.08 mg/dL (ref 0.50–1.35)
Potassium: 4.4 mEq/L (ref 3.5–5.3)

## 2012-08-25 LAB — CBC WITH DIFFERENTIAL (CANCER CENTER ONLY)
BASO#: 0 10*3/uL (ref 0.0–0.2)
BASO%: 0.3 % (ref 0.0–2.0)
HCT: 37.9 % — ABNORMAL LOW (ref 38.7–49.9)
HGB: 12.6 g/dL — ABNORMAL LOW (ref 13.0–17.1)
LYMPH#: 1.9 10*3/uL (ref 0.9–3.3)
MONO#: 0.4 10*3/uL (ref 0.1–0.9)
NEUT#: 3.4 10*3/uL (ref 1.5–6.5)
NEUT%: 57.1 % (ref 40.0–80.0)
WBC: 5.9 10*3/uL (ref 4.0–10.0)

## 2012-08-25 LAB — CEA: CEA: 2 ng/mL (ref 0.0–5.0)

## 2012-08-25 MED ORDER — AZITHROMYCIN 250 MG PO TABS: 250.0000 mg | ORAL_TABLET | Freq: Every day | ORAL | Status: DC

## 2012-08-25 MED ORDER — HEPARIN SOD (PORK) LOCK FLUSH 100 UNIT/ML IV SOLN
250.0000 [IU] | Freq: Once | INTRAVENOUS | Status: AC | PRN
  Filled 2012-08-25: qty 5

## 2012-08-25 MED ORDER — SODIUM CHLORIDE 0.9 % IJ SOLN
10.0000 mL | INTRAMUSCULAR | Status: DC | PRN
Start: 2012-08-25 — End: 2014-05-18
  Filled 2012-08-25: qty 10

## 2012-08-25 MED ORDER — LEUCOVORIN CALCIUM INJECTION 350 MG
400.0000 mg/m2 | Freq: Once | INTRAVENOUS | Status: AC
  Administered 2012-08-25: 1232 mg via INTRAVENOUS
  Filled 2012-08-25: qty 61.6

## 2012-08-25 MED ORDER — SODIUM CHLORIDE 0.9 % IV SOLN
Freq: Once | INTRAVENOUS | Status: AC
  Administered 2012-08-25: 11:00:00 via INTRAVENOUS

## 2012-08-25 MED ORDER — PROCHLORPERAZINE MALEATE 10 MG PO TABS
10.0000 mg | ORAL_TABLET | Freq: Once | ORAL | Status: AC
  Administered 2012-08-25: 10 mg via ORAL

## 2012-08-25 MED ORDER — FLUOROURACIL CHEMO INJECTION 2.5 GM/50ML
400.0000 mg/m2 | Freq: Once | INTRAVENOUS | Status: AC
  Administered 2012-08-25: 1250 mg via INTRAVENOUS
  Filled 2012-08-25: qty 25

## 2012-08-25 MED ORDER — SODIUM CHLORIDE 0.9 % IV SOLN
1920.0000 mg/m2 | INTRAVENOUS | Status: AC
  Administered 2012-08-25: 5900 mg via INTRAVENOUS
  Filled 2012-08-25: qty 118

## 2012-08-25 NOTE — Progress Notes (Signed)
This office note has been dictated.

## 2012-08-25 NOTE — Patient Instructions (Signed)
[facility] Discharge Instructions for Patients Receiving Chemotherapy  Today you received the following chemotherapy agents 5FU  To help prevent nausea and vomiting after your treatment, we encourage you to take your nausea medication as prescribed.   If you develop nausea and vomiting that is not controlled by your nausea medication, call the clinic. If it is after clinic hours your family physician or the after hours number for the clinic or go to the Emergency Department.   BELOW ARE SYMPTOMS THAT SHOULD BE REPORTED IMMEDIATELY:  *FEVER GREATER THAN 100.5 F  *CHILLS WITH OR WITHOUT FEVER  NAUSEA AND VOMITING THAT IS NOT CONTROLLED WITH YOUR NAUSEA MEDICATION  *UNUSUAL SHORTNESS OF BREATH  *UNUSUAL BRUISING OR BLEEDING  TENDERNESS IN MOUTH AND THROAT WITH OR WITHOUT PRESENCE OF ULCERS  *URINARY PROBLEMS  *BOWEL PROBLEMS  UNUSUAL RASH Items with * indicate a potential emergency and should be followed up as soon as possible.  One of the nurses will contact you 24 hours after your treatment. Please let the nurse know about any problems that you may have experienced. Feel free to call the clinic you have any questions or concerns. The clinic phone number is [phone].   I have been informed and understand all the instructions given to me. I know to contact the clinic, my physician, or go to the Emergency Department if any problems should occur. I do not have any questions at this time, but understand that I may call the clinic during office hours   should I have any questions or need assistance in obtaining follow up care.    __________________________________________  _____________  __________ Signature of Patient or Authorized Representative            Date                   Time    __________________________________________ Nurse's Signature    

## 2012-08-25 NOTE — Progress Notes (Signed)
CC:   [name], M.D. [name], M.D. [name], M.D.  DIAGNOSIS:  Stage II (T3 N0 M0) adenocarcinoma of the sigmoid colon-high risk for recurrence.  CURRENT THERAPY:  Patient is status post 10 cycles of infusional 5- FU/leucovorin 5.  INTERIM HISTORY:  Brandon Schaefer comes in for followup.  He continues to look good.  He continues to feel well.  His abdominal incision has pretty much healed up.  He sees Dr. Magnus Ivan tomorrow.  He has had no issues with nausea and vomiting.  He has had no diarrhea. There have been no mouth sores.  He has lost weight, so he is quite happy about this.  PHYSICAL EXAMINATION:  General:  This is a morbidly obese white gentleman in no obvious distress.  Vital signs:  Temperature of 98.1, pulse 103, respiratory rate 18, blood pressure 112/53.  Weight is 389. Head and neck:  Normocephalic, atraumatic skull.  There are no ocular or oral lesions.  There are no palpable cervical or supraclavicular lymph nodes.  Lungs:  Clear bilaterally.  Cardiac:  Regular rate and rhythm with a normal S1 and S2.  There are no murmurs, rubs, or bruits. Abdomen:  Soft with good bowel sounds.  There is no palpable abdominal mass.  There is no palpable hepatosplenomegaly.  He has a healing laparotomy scar.  Extremities:  Some chronic stasis dermatitis-type changes in his lower extremities.  Back:  No tenderness of the spine, ribs, or hips.  LABORATORY STUDIES:  Show white cell count 5.8, hemoglobin 12.6, hematocrit 37.9, platelet count 224.  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  He has done quite well with chemotherapy.  He had a high Oncotype score of 36.  As such, I felt that he would benefit from adjuvant chemotherapy.  I  He will go for his 11th cycle of chemo.  He has 1 more after this.  We will plan to get him back in 2 weeks for his 12th and final cycle  of treatment.    ______________________________ Brandon Schaefer, M.D. PRE/MEDQ  D:  08/25/2012  T:  08/25/2012  Job:  [id number]

## 2012-08-26 ENCOUNTER — Encounter (INDEPENDENT_AMBULATORY_CARE_PROVIDER_SITE_OTHER): Payer: Self-pay | Admitting: Surgery

## 2012-08-26 ENCOUNTER — Ambulatory Visit (INDEPENDENT_AMBULATORY_CARE_PROVIDER_SITE_OTHER): Payer: 59 | Admitting: Surgery

## 2012-08-26 VITALS — BP 124/84 | HR 106 | Temp 96.8°F | Ht 72.0 in | Wt 393.0 lb

## 2012-08-26 DIAGNOSIS — T8189XD Other complications of procedures, not elsewhere classified, subsequent encounter: Secondary | ICD-10-CM

## 2012-08-26 DIAGNOSIS — S31109A Unspecified open wound of abdominal wall, unspecified quadrant without penetration into peritoneal cavity, initial encounter: Secondary | ICD-10-CM

## 2012-08-26 NOTE — Progress Notes (Signed)
Subjective:     Patient ID: Brandon Schaefer, male   DOB: [birth year], 57 y.o.   MRN: [id number]  HPI He is continuing on chemotherapy. After this time he will only have one treatment left. He remains on antibiotics. He still has drainage from the lower abdominal wound  Review of Systems     Objective:   Physical Exam On exam, the wound continues to get smaller but still deep. There is still some mild purulence. I repacked the wound after treating with silver nitrate    Assessment:     Nonhealing surgical wound     Plan:     We will continue the current wound care and I'll see him back in approximately 3-4 weeks

## 2012-08-27 ENCOUNTER — Ambulatory Visit (HOSPITAL_BASED_OUTPATIENT_CLINIC_OR_DEPARTMENT_OTHER): Payer: 59

## 2012-08-27 VITALS — BP 115/69 | HR 88 | Temp 97.0°F

## 2012-08-27 DIAGNOSIS — C187 Malignant neoplasm of sigmoid colon: Secondary | ICD-10-CM

## 2012-08-27 MED ORDER — SODIUM CHLORIDE 0.9 % IJ SOLN
10.0000 mL | INTRAMUSCULAR | Status: DC | PRN
  Administered 2012-08-27: 10 mL
  Filled 2012-08-27: qty 10

## 2012-08-27 MED ORDER — HEPARIN SOD (PORK) LOCK FLUSH 100 UNIT/ML IV SOLN
500.0000 [IU] | Freq: Once | INTRAVENOUS | Status: AC | PRN
  Administered 2012-08-27: 500 [IU]
  Filled 2012-08-27: qty 5

## 2012-08-27 NOTE — Patient Instructions (Addendum)
[facility] Discharge Instructions for Patients Receiving Chemotherapy  Today you received the following chemotherapy agents 5FU  To help prevent nausea and vomiting after your treatment, we encourage you to take your nausea medication  Begin taking it at  and take it as often as prescribed   If you develop nausea and vomiting that is not controlled by your nausea medication, call the clinic. If it is after clinic hours your family physician or the after hours number for the clinic or go to the Emergency Department.   BELOW ARE SYMPTOMS THAT SHOULD BE REPORTED IMMEDIATELY:  *FEVER GREATER THAN 100.5 F  *CHILLS WITH OR WITHOUT FEVER  NAUSEA AND VOMITING THAT IS NOT CONTROLLED WITH YOUR NAUSEA MEDICATION  *UNUSUAL SHORTNESS OF BREATH  *UNUSUAL BRUISING OR BLEEDING  TENDERNESS IN MOUTH AND THROAT WITH OR WITHOUT PRESENCE OF ULCERS  *URINARY PROBLEMS  *BOWEL PROBLEMS  UNUSUAL RASH Items with * indicate a potential emergency and should be followed up as soon as possible.  One of the nurses will contact you 24 hours after your treatment. Please let the nurse know about any problems that you may have experienced. Feel free to call the clinic you have any questions or concerns. The clinic phone number is [phone].   I have been informed and understand all the instructions given to me. I know to contact the clinic, my physician, or go to the Emergency Department if any problems should occur. I do not have any questions at this time, but understand that I may call the clinic during office hours   should I have any questions or need assistance in obtaining follow up care.    __________________________________________  _____________  __________ Signature of Patient or Authorized Representative            Date                   Time    __________________________________________ Nurse's Signature

## 2012-09-08 ENCOUNTER — Other Ambulatory Visit (HOSPITAL_BASED_OUTPATIENT_CLINIC_OR_DEPARTMENT_OTHER): Payer: 59 | Admitting: Lab

## 2012-09-08 ENCOUNTER — Ambulatory Visit (HOSPITAL_BASED_OUTPATIENT_CLINIC_OR_DEPARTMENT_OTHER): Payer: 59 | Admitting: Hematology & Oncology

## 2012-09-08 ENCOUNTER — Ambulatory Visit (HOSPITAL_BASED_OUTPATIENT_CLINIC_OR_DEPARTMENT_OTHER): Payer: 59

## 2012-09-08 VITALS — BP 110/53 | HR 99 | Temp 98.3°F | Resp 20 | Ht 72.0 in | Wt 397.0 lb

## 2012-09-08 DIAGNOSIS — C187 Malignant neoplasm of sigmoid colon: Secondary | ICD-10-CM

## 2012-09-08 DIAGNOSIS — Z5111 Encounter for antineoplastic chemotherapy: Secondary | ICD-10-CM

## 2012-09-08 LAB — COMPREHENSIVE METABOLIC PANEL
AST: 17 U/L (ref 0–37)
BUN: 15 mg/dL (ref 6–23)
CO2: 23 mEq/L (ref 19–32)
Calcium: 9.5 mg/dL (ref 8.4–10.5)
Creatinine, Ser: 0.85 mg/dL (ref 0.50–1.35)
Potassium: 4.2 mEq/L (ref 3.5–5.3)
Sodium: 141 mEq/L (ref 135–145)

## 2012-09-08 LAB — CBC WITH DIFFERENTIAL (CANCER CENTER ONLY)
BASO%: 0.3 % (ref 0.0–2.0)
LYMPH%: 22.8 % (ref 14.0–48.0)
MCV: 89 fL (ref 82–98)
MONO#: 0.6 10*3/uL (ref 0.1–0.9)
NEUT#: 5.1 10*3/uL (ref 1.5–6.5)
Platelets: 202 10*3/uL (ref 145–400)
RBC: 4.04 10*6/uL — ABNORMAL LOW (ref 4.20–5.70)
RDW: 17 % — ABNORMAL HIGH (ref 11.1–15.7)
WBC: 7.7 10*3/uL (ref 4.0–10.0)

## 2012-09-08 MED ORDER — FLUOROURACIL CHEMO INJECTION 5 GM/100ML
1920.0000 mg/m2 | INTRAVENOUS | Status: DC
  Administered 2012-09-08: 5900 mg via INTRAVENOUS
  Filled 2012-09-08: qty 118

## 2012-09-08 MED ORDER — LEUCOVORIN CALCIUM INJECTION 350 MG
400.0000 mg/m2 | Freq: Once | INTRAVENOUS | Status: AC
  Administered 2012-09-08: 1232 mg via INTRAVENOUS
  Filled 2012-09-08: qty 61.6

## 2012-09-08 MED ORDER — SODIUM CHLORIDE 0.9 % IJ SOLN
10.0000 mL | INTRAMUSCULAR | Status: DC | PRN
  Filled 2012-09-08: qty 10

## 2012-09-08 MED ORDER — FLUOROURACIL CHEMO INJECTION 2.5 GM/50ML
400.0000 mg/m2 | Freq: Once | INTRAVENOUS | Status: AC
  Administered 2012-09-08: 1250 mg via INTRAVENOUS
  Filled 2012-09-08: qty 25

## 2012-09-08 MED ORDER — PROCHLORPERAZINE MALEATE 10 MG PO TABS
10.0000 mg | ORAL_TABLET | Freq: Once | ORAL | Status: AC
  Administered 2012-09-08: 10 mg via ORAL

## 2012-09-08 MED ORDER — HEPARIN SOD (PORK) LOCK FLUSH 100 UNIT/ML IV SOLN
500.0000 [IU] | Freq: Once | INTRAVENOUS | Status: DC | PRN
  Filled 2012-09-08: qty 5

## 2012-09-08 MED ORDER — SODIUM CHLORIDE 0.9 % IV SOLN
Freq: Once | INTRAVENOUS | Status: AC
  Administered 2012-09-08: 10:00:00 via INTRAVENOUS

## 2012-09-08 NOTE — Progress Notes (Signed)
This office note has been dictated.

## 2012-09-08 NOTE — Progress Notes (Signed)
CC:   [name], M.D. [name], M.D. [name], M.D.  DIAGNOSIS:  Stage II (T3, N0, M0) adenocarcinoma of the sigmoid colon. Current therapy:  Patient to complete adjuvant chemotherapy with infusional 5-FU today.  INTERIM HISTORY:  Brandon Schaefer comes in for followup.  Today will be his last week of chemotherapy.  He has had 11 cycles of chemo so far.  He has done very well.  He has lost about 60 pounds since we 1st started. He wanted to lose some weight.  His abdominal wound is healing up.  There is still 1 small area that is open.  He has had no problems with cough or shortness of breath.  He has had no mouth sores.  He has had occasional diarrhea.  __________ leg swelling. He has had no bleeding.  He has had no fever.  Overall, his performance status is ECOG 1-2.  PHYSICAL EXAMINATION:  General:  This is a morbidly obese white gentleman in no obvious distress.  Vital signs:  Temperature of 98.3, pulse 99, respiratory rate 20, blood pressure 110/53.  Weight is 397. Head and neck exam:  Shows a normocephalic, atraumatic skull.  There are no ocular or oral lesions.  There are no palpable cervical or supraclavicular lymph nodes.  Lungs:  Clear bilaterally.  Cardiac exam: Regular rate and rhythm with a normal S1, S2.  There are no murmurs, rubs, or bruits.  Abdominal exam:  Shows a laparotomy scar that is healed except for 1 area below the umbilicus.  He has a dressing on this.  He has a fluid wave.  There is no guarding or rebound tenderness. There is no palpable hepatosplenomegaly.  Extremities:  Shows some slight stasis dermatitis changes in his lower legs.  No edema is noted in his legs.  Neurological exam:  Shows no focal neurological deficits.  LABORATORY STUDIES:  White cell count 7.7, hemoglobin 11.8, hematocrit 35.8, platelet count 202.  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   He had a pretty high Oncotype score.  His score is 36.  As such, I felt that adjuvant chemotherapy would be beneficial to him.  We will finish up infusional 5-FU today.  Of note, his last CEA done 2 weeks ago was 2.  We will now plan for followup.  We will plan for scans to be done in about 6 weeks.  These will be our baseline scans.  I probably will follow  up Brandon Schaefer with scans every 4 months for the 1st year and then every 6 months the 2nd year.  Brandon Schaefer has done quite nicely.  I am very impressed with his resilience.__________    ______________________________ Josph Macho, M.D. PRE/MEDQ  D:  09/08/2012  T:  09/08/2012  Job:  [id number]

## 2012-09-08 NOTE — Patient Instructions (Signed)
Fluorouracil, 5-FU injection What is this medicine? FLUOROURACIL, 5-FU (flure oh YOOR a sil) is a chemotherapy drug. It slows the growth of cancer cells. This medicine is used to treat many types of cancer like breast cancer, colon or rectal cancer, pancreatic cancer, and stomach cancer. This medicine may be used for other purposes; ask your health care provider or pharmacist if you have questions. What should I tell my health care provider before I take this medicine? They need to know if you have any of these conditions: -blood disorders -dihydropyrimidine dehydrogenase (DPD) deficiency -infection (especially a virus infection such as chickenpox, cold sores, or herpes) -kidney disease -liver disease -malnourished, poor nutrition -recent or ongoing radiation therapy -an unusual or allergic reaction to fluorouracil, other chemotherapy, other medicines, foods, dyes, or preservatives -pregnant or trying to get pregnant -breast-feeding How should I use this medicine? This drug is given as an infusion or injection into a vein. It is administered in a hospital or clinic by a specially trained health care professional. Talk to your pediatrician regarding the use of this medicine in children. Special care may be needed. Overdosage: If you think you have taken too much of this medicine contact a poison control center or emergency room at once. NOTE: This medicine is only for you. Do not share this medicine with others. What if I miss a dose? It is important not to miss your dose. Call your doctor or health care professional if you are unable to keep an appointment. What may interact with this medicine? -allopurinol -cimetidine -dapsone -digoxin -hydroxyurea -leucovorin -levamisole -medicines for seizures like ethotoin, fosphenytoin, phenytoin -medicines to increase blood counts like filgrastim, pegfilgrastim, sargramostim -medicines that treat or prevent blood clots like warfarin,  enoxaparin, and dalteparin -methotrexate -metronidazole -pyrimethamine -some other chemotherapy drugs like busulfan, cisplatin, estramustine, vinblastine -trimethoprim -trimetrexate -vaccines Talk to your doctor or health care professional before taking any of these medicines: -acetaminophen -aspirin -ibuprofen -ketoprofen -naproxen This list may not describe all possible interactions. Give your health care provider a list of all the medicines, herbs, non-prescription drugs, or dietary supplements you use. Also tell them if you smoke, drink alcohol, or use illegal drugs. Some items may interact with your medicine. What should I watch for while using this medicine? Visit your doctor for checks on your progress. This drug may make you feel generally unwell. This is not uncommon, as chemotherapy can affect healthy cells as well as cancer cells. Report any side effects. Continue your course of treatment even though you feel ill unless your doctor tells you to stop. In some cases, you may be given additional medicines to help with side effects. Follow all directions for their use. Call your doctor or health care professional for advice if you get a fever, chills or sore throat, or other symptoms of a cold or flu. Do not treat yourself. This drug decreases your body's ability to fight infections. Try to avoid being around people who are sick. This medicine may increase your risk to bruise or bleed. Call your doctor or health care professional if you notice any unusual bleeding. Be careful brushing and flossing your teeth or using a toothpick because you may get an infection or bleed more easily. If you have any dental work done, tell your dentist you are receiving this medicine. Avoid taking products that contain aspirin, acetaminophen, ibuprofen, naproxen, or ketoprofen unless instructed by your doctor. These medicines may hide a fever. Do not become pregnant while taking this medicine. Women should    inform their doctor if they wish to become pregnant or think they might be pregnant. There is a potential for serious side effects to an unborn child. Talk to your health care professional or pharmacist for more information. Do not breast-feed an infant while taking this medicine. Men should inform their doctor if they wish to father a child. This medicine may lower sperm counts. Do not treat diarrhea with over the counter products. Contact your doctor if you have diarrhea that lasts more than 2 days or if it is severe and watery. This medicine can make you more sensitive to the sun. Keep out of the sun. If you cannot avoid being in the sun, wear protective clothing and use sunscreen. Do not use sun lamps or tanning beds/booths. What side effects may I notice from receiving this medicine? Side effects that you should report to your doctor or health care professional as soon as possible: -allergic reactions like skin rash, itching or hives, swelling of the face, lips, or tongue -low blood counts - this medicine may decrease the number of white blood cells, red blood cells and platelets. You may be at increased risk for infections and bleeding. -signs of infection - fever or chills, cough, sore throat, pain or difficulty passing urine -signs of decreased platelets or bleeding - bruising, pinpoint red spots on the skin, black, tarry stools, blood in the urine -signs of decreased red blood cells - unusually weak or tired, fainting spells, lightheadedness -breathing problems -changes in vision -chest pain -mouth sores -nausea and vomiting -pain, swelling, redness at site where injected -pain, tingling, numbness in the hands or feet -redness, swelling, or sores on hands or feet -stomach pain -unusual bleeding Side effects that usually do not require medical attention (report to your doctor or health care professional if they continue or are bothersome): -changes in finger or toe  nails -diarrhea -dry or itchy skin -hair loss -headache -loss of appetite -sensitivity of eyes to the light -stomach upset -unusually teary eyes This list may not describe all possible side effects. Call your doctor for medical advice about side effects. You may report side effects to FDA at 1-800-FDA-1088. Where should I keep my medicine? This drug is given in a hospital or clinic and will not be stored at home. NOTE: This sheet is a summary. It may not cover all possible information. If you have questions about this medicine, talk to your doctor, pharmacist, or health care provider.  2012, [employer]vier/[employer] Standard. (08/05/2007 1:53:16 PM)Leucovorin injection What is this medicine? LEUCOVORIN (loo koe VOR in) is used to prevent or treat the harmful effects of some medicines. This medicine is used to treat anemia caused by a low amount of folic acid in the body. It is also used with 5-fluorouracil (5-FU) to treat colon cancer. This medicine may be used for other purposes; ask your health care provider or pharmacist if you have questions. What should I tell my health care provider before I take this medicine? They need to know if you have any of these conditions: -anemia from low levels of vitamin B-12 in the blood -an unusual or allergic reaction to leucovorin, folic acid, other medicines, foods, dyes, or preservatives -pregnant or trying to get pregnant -breast-feeding How should I use this medicine? This medicine is for injection into a muscle or into a vein. It is given by a health care professional in a hospital or clinic setting. Talk to your pediatrician regarding the use of this medicine in children. Special care  may be needed. Overdosage: If you think you have taken too much of this medicine contact a poison control center or emergency room at once. NOTE: This medicine is only for you. Do not share this medicine with others. What if I miss a dose? This does not apply. What may  interact with this medicine? -capecitabine -fluorouracil -phenobarbital -phenytoin -primidone -trimethoprim-sulfamethoxazole This list may not describe all possible interactions. Give your health care provider a list of all the medicines, herbs, non-prescription drugs, or dietary supplements you use. Also tell them if you smoke, drink alcohol, or use illegal drugs. Some items may interact with your medicine. What should I watch for while using this medicine? Your condition will be monitored carefully while you are receiving this medicine. This medicine may increase the side effects of 5-fluorouracil, 5-FU. Tell your doctor or health care professional if you have diarrhea or mouth sores that do not get better or that get worse. What side effects may I notice from receiving this medicine? Side effects that you should report to your doctor or health care professional as soon as possible: -allergic reactions like skin rash, itching or hives, swelling of the face, lips, or tongue -breathing problems -fever, infection -mouth sores -unusual bleeding or bruising -unusually weak or tired Side effects that usually do not require medical attention (report to your doctor or health care professional if they continue or are bothersome): -constipation or diarrhea -loss of appetite -nausea, vomiting This list may not describe all possible side effects. Call your doctor for medical advice about side effects. You may report side effects to FDA at 1-800-FDA-1088. Where should I keep my medicine? This drug is given in a hospital or clinic and will not be stored at home. NOTE: This sheet is a summary. It may not cover all possible information. If you have questions about this medicine, talk to your doctor, pharmacist, or health care provider.  2012, Elsevier/Gold Standard. (10/06/2007 4:50:29 PM)

## 2012-09-10 ENCOUNTER — Ambulatory Visit (HOSPITAL_BASED_OUTPATIENT_CLINIC_OR_DEPARTMENT_OTHER): Payer: 59

## 2012-09-10 VITALS — BP 130/80 | HR 101 | Temp 97.6°F | Resp 22

## 2012-09-10 DIAGNOSIS — Z452 Encounter for adjustment and management of vascular access device: Secondary | ICD-10-CM

## 2012-09-10 DIAGNOSIS — C187 Malignant neoplasm of sigmoid colon: Secondary | ICD-10-CM

## 2012-09-10 MED ORDER — HEPARIN SOD (PORK) LOCK FLUSH 100 UNIT/ML IV SOLN
500.0000 [IU] | Freq: Once | INTRAVENOUS | Status: AC | PRN
  Administered 2012-09-10: 500 [IU]
  Filled 2012-09-10: qty 5

## 2012-09-10 MED ORDER — SODIUM CHLORIDE 0.9 % IJ SOLN
10.0000 mL | INTRAMUSCULAR | Status: DC | PRN
  Administered 2012-09-10: 10 mL
  Filled 2012-09-10: qty 10

## 2012-09-18 ENCOUNTER — Encounter (INDEPENDENT_AMBULATORY_CARE_PROVIDER_SITE_OTHER): Payer: Self-pay | Admitting: Surgery

## 2012-09-18 ENCOUNTER — Ambulatory Visit (INDEPENDENT_AMBULATORY_CARE_PROVIDER_SITE_OTHER): Payer: 59 | Admitting: Surgery

## 2012-09-18 VITALS — BP 138/84 | HR 100 | Temp 97.6°F | Resp 20 | Ht 72.0 in | Wt >= 6400 oz

## 2012-09-18 DIAGNOSIS — S31109A Unspecified open wound of abdominal wall, unspecified quadrant without penetration into peritoneal cavity, initial encounter: Secondary | ICD-10-CM

## 2012-09-18 DIAGNOSIS — T8189XD Other complications of procedures, not elsewhere classified, subsequent encounter: Secondary | ICD-10-CM

## 2012-09-18 NOTE — Progress Notes (Signed)
Subjective:     Patient ID: Brandon Schaefer, male   DOB: 12-29-1955, 57 y.o.   MRN: [id number]  HPI He is here for another wound check. He is now finished his chemotherapy. He has also finished his antibiotics. He has no complaints.  Review of Systems     Objective:   Physical Exam There is still a deep small open wound at the lower midline. There is no erythema. Treated with silver nitrate    Assessment:     Nonhealing surgical wound     Plan:     I will see him back in July. Again, if this area persists, I will excise the area in the operating room. This can be done at the same time his Port-A-Cath removal if his oncologist feels we can remove it.

## 2012-09-22 ENCOUNTER — Encounter: Payer: Self-pay | Admitting: Hematology & Oncology

## 2012-10-19 ENCOUNTER — Ambulatory Visit (HOSPITAL_COMMUNITY): Payer: 59

## 2012-10-19 ENCOUNTER — Ambulatory Visit (HOSPITAL_COMMUNITY)
Admission: RE | Admit: 2012-10-19 | Discharge: 2012-10-19 | Disposition: A | Discharge status: Home / Self Care | Payer: 59 | Source: Ambulatory Visit | Attending: Hematology & Oncology | Admitting: Hematology & Oncology

## 2012-10-19 ENCOUNTER — Encounter (INDEPENDENT_AMBULATORY_CARE_PROVIDER_SITE_OTHER): Payer: Self-pay | Admitting: Surgery

## 2012-10-19 ENCOUNTER — Ambulatory Visit (INDEPENDENT_AMBULATORY_CARE_PROVIDER_SITE_OTHER): Payer: 59 | Admitting: Surgery

## 2012-10-19 VITALS — BP 172/82 | HR 84 | Resp 20 | Ht 72.0 in | Wt >= 6400 oz

## 2012-10-19 DIAGNOSIS — T8189XD Other complications of procedures, not elsewhere classified, subsequent encounter: Secondary | ICD-10-CM

## 2012-10-19 DIAGNOSIS — C187 Malignant neoplasm of sigmoid colon: Secondary | ICD-10-CM | POA: Insufficient documentation

## 2012-10-19 DIAGNOSIS — N200 Calculus of kidney: Secondary | ICD-10-CM | POA: Insufficient documentation

## 2012-10-19 DIAGNOSIS — K7689 Other specified diseases of liver: Secondary | ICD-10-CM | POA: Insufficient documentation

## 2012-10-19 DIAGNOSIS — N281 Cyst of kidney, acquired: Secondary | ICD-10-CM | POA: Insufficient documentation

## 2012-10-19 DIAGNOSIS — N1330 Unspecified hydronephrosis: Secondary | ICD-10-CM | POA: Insufficient documentation

## 2012-10-19 DIAGNOSIS — C189 Malignant neoplasm of colon, unspecified: Secondary | ICD-10-CM

## 2012-10-19 DIAGNOSIS — N201 Calculus of ureter: Secondary | ICD-10-CM | POA: Insufficient documentation

## 2012-10-19 MED ORDER — IOHEXOL 300 MG/ML  SOLN
100.0000 mL | Freq: Once | INTRAMUSCULAR | Status: AC | PRN
  Administered 2012-10-19: 100 mL via INTRAVENOUS

## 2012-10-19 NOTE — Progress Notes (Signed)
Subjective:     Patient ID: Brandon Schaefer, male   DOB: [birth year], 57 y.o.   MRN: [id number]  HPI He is here for a followup of his nonhealing midline abdominal wound. He reports the drainage continues to decrease. He denies any abdominal pain but is having some nausea after the CAT contrast. He reports he is moving his bowels well  Review of Systems     Objective:   Physical Exam On exam, his abdomen is soft and nontender. His small midline wound is still approximately an inch deep. There is no purulence. I treated with silver nitrate  He just had a CAT scan today that his oncologist ordered. I reviewed this. There is no evidence of adenopathy or metastatic spread of his cancer. He does however have an obstructing ureteral stone on left side    Assessment:     Nonhealing surgical wound Obstructing left ureteral kidney stone     Plan:     He will continue his current wound care and I will see him back in one month. Because of the ureteral stone, he will need to be referred to the urologist ASAP further evaluation

## 2012-10-23 ENCOUNTER — Telehealth: Payer: Self-pay | Admitting: Oncology

## 2012-10-23 NOTE — Telephone Encounter (Signed)
Message copied by Lacie Draft on Fri Oct 23, 2012  5:25 PM ------      Message from: Arlan Organ R      Created: Tue Oct 20, 2012  1:58 PM       Call - no obvious cancer!!  You do have kidney stones that need to be watched!!!  Cindee Lame ------

## 2012-11-02 ENCOUNTER — Telehealth: Payer: Self-pay | Admitting: *Deleted

## 2012-11-02 NOTE — Telephone Encounter (Addendum)
Message copied by Mirian Capuchin on Mon Nov 02, 2012  4:41 PM ------      Message from: Josph Macho      Created: Tue Oct 20, 2012  1:58 PM       Call - no obvious cancer!!  You do have kidney stones that need to be watched!!!  Cindee Lame ------This message given to patient.  Voiced understanding.  Pt knew about the kidney stones.

## 2012-11-03 ENCOUNTER — Encounter: Payer: Self-pay | Admitting: *Deleted

## 2012-11-03 ENCOUNTER — Ambulatory Visit (HOSPITAL_BASED_OUTPATIENT_CLINIC_OR_DEPARTMENT_OTHER): Payer: 59 | Admitting: Hematology & Oncology

## 2012-11-03 ENCOUNTER — Other Ambulatory Visit (HOSPITAL_BASED_OUTPATIENT_CLINIC_OR_DEPARTMENT_OTHER): Payer: 59 | Admitting: Lab

## 2012-11-03 VITALS — BP 123/46 | HR 85 | Temp 97.8°F | Resp 20 | Ht 72.0 in | Wt >= 6400 oz

## 2012-11-03 DIAGNOSIS — C187 Malignant neoplasm of sigmoid colon: Secondary | ICD-10-CM

## 2012-11-03 LAB — CBC WITH DIFFERENTIAL (CANCER CENTER ONLY)
Eosinophils Absolute: 0.3 10*3/uL (ref 0.0–0.5)
MONO#: 0.7 10*3/uL (ref 0.1–0.9)
MONO%: 7.7 % (ref 0.0–13.0)
NEUT#: 5.9 10*3/uL (ref 1.5–6.5)
Platelets: 254 10*3/uL (ref 145–400)
RBC: 4.33 10*6/uL (ref 4.20–5.70)
WBC: 8.9 10*3/uL (ref 4.0–10.0)

## 2012-11-03 LAB — CMP (CANCER CENTER ONLY)
Alkaline Phosphatase: 75 U/L (ref 26–84)
Creat: 1 mg/dl (ref 0.6–1.2)
Glucose, Bld: 130 mg/dL — ABNORMAL HIGH (ref 73–118)
Sodium: 144 mEq/L (ref 128–145)
Total Bilirubin: 0.7 mg/dl (ref 0.20–1.60)
Total Protein: 7.1 g/dL (ref 6.4–8.1)

## 2012-11-03 NOTE — Progress Notes (Signed)
This office note has been dictated.

## 2012-11-04 ENCOUNTER — Telehealth: Payer: Self-pay | Admitting: Hematology & Oncology

## 2012-11-04 NOTE — Telephone Encounter (Signed)
Pt aware of 10-20 CT to be NPO 4 hrs and to drink contrast. He is also aware of 10-27 MD

## 2012-11-04 NOTE — Progress Notes (Signed)
CC:   [name], M.D.  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  CURRENT THERAPY:  Patient has completed adjuvant chemotherapy with 2 week infusional 5-FU.  INTERIM HISTORY:  Brandon Schaefer comes in for his followup.  He is doing okay.  Unfortunately, he has now kidney stones to deal with.  We did do scans on him.  The scans were done on 10/19/2012.  There was no evidence of recurrent disease.  However, he did have kidney stones with the left kidney.  It sounds like he is going to need to have surgery to remove these.  His abdominal laparotomy incision is still healing up.  This has healed up very, very slowly.  He is working pretty much full-time.  He has had no problem with bleeding.  He has had no fever.  When we last saw him in May, his CEA was 1.7.  He has had no leg swelling.  He has had no issues with respect to his diabetes.  He has had no problems with his bowels or bladder.  PHYSICAL EXAMINATION:  General:  This is a morbidly obese white gentleman in no obvious distress.  Vital signs:  Temperature of 97.8, pulse 85, respiratory 20, blood pressure 123/46.  Weight is 408.  Head and neck:  Normocephalic, atraumatic skull.  There are no ocular or oral lesions.  There are no palpable cervical or supraclavicular lymph nodes. Lungs:  Clear bilaterally.  Cardiac:  Regular rate and rhythm with a normal S1, S2.  There are no murmurs, rubs or bruits.  Abdomen:  Soft with good bowel sounds.  There is no palpable abdominal mass.  He has a dressing over the laparotomy scar.  There is no fluid wave.  There is no palpable hepatosplenomegaly.  Extremities:  Show chronic nonpitting edema of the legs.  Skin:  No rashes, ecchymosis, or petechia.  LABORATORY STUDIES:  White blood cell count 8.9, hemoglobin 12.7, hematocrit 38.3, platelet count 254.  IMPRESSION:  Brandon Schaefer is a 57 year old gentleman with  stage II adenocarcinoma of the sigmoid colon.  His Oncotype score is 36.  He is microsatellite stable.  I felt he was a good candidate for adjuvant chemotherapy based on these factors.  He completed his chemotherapy back in May.  He did very well with this.  I do not see any problems from my point of view with him having surgery.  His Port-A-Cath will be taken out when he has the surgery for his kidney stones.  The Port-A-Cath tip is only in the brachiocephalic vein so I do not see that this is going to be usable.  We will plan to do another CT scan in about 3 months' time.  I will plan to see Brandon Schaefer back afterwards.    ______________________________ Josph Macho, M.D. PRE/MEDQ  D:  11/03/2012  T:  11/04/2012  Job:  [id number]

## 2012-11-19 ENCOUNTER — Other Ambulatory Visit: Payer: Self-pay | Admitting: Urology

## 2012-11-27 ENCOUNTER — Ambulatory Visit (INDEPENDENT_AMBULATORY_CARE_PROVIDER_SITE_OTHER): Payer: 59 | Admitting: Surgery

## 2012-11-27 ENCOUNTER — Encounter (INDEPENDENT_AMBULATORY_CARE_PROVIDER_SITE_OTHER): Payer: Self-pay | Admitting: Surgery

## 2012-11-27 VITALS — BP 133/84 | HR 76 | Temp 97.7°F | Resp 18 | Ht 72.0 in | Wt >= 6400 oz

## 2012-11-27 DIAGNOSIS — T8189XD Other complications of procedures, not elsewhere classified, subsequent encounter: Secondary | ICD-10-CM

## 2012-11-27 DIAGNOSIS — T8189XA Other complications of procedures, not elsewhere classified, initial encounter: Secondary | ICD-10-CM

## 2012-11-27 NOTE — Progress Notes (Signed)
Subjective:     Patient ID: Brandon Schaefer, male   DOB: 18-Apr-1955, 57 y.o.   MRN: [id number]  HPI He is here for followup visit. He is actually going to be in the hospital for at least a week in late September regarding his kidney stones. His chronic abdominal wound is unchanged  Review of Systems     Objective:   Physical Exam On exam, the wound continues to slowly closed and there still chronically purulent drainage    Assessment:     Nonhealing surgical wound     Plan:     I. Lead to go ahead and schedule him for Port-A-Cath removal and excision of chronic abdominal wound in the operating room at the same time as the urologic procedure on September 24 of this is possible.

## 2012-12-29 ENCOUNTER — Other Ambulatory Visit (HOSPITAL_COMMUNITY): Payer: Self-pay | Admitting: Urology

## 2012-12-29 NOTE — Progress Notes (Signed)
ekg 01-14-12 epic Chest ct 10-19-12 epic

## 2012-12-30 ENCOUNTER — Inpatient Hospital Stay (HOSPITAL_COMMUNITY): Admission: RE | Admit: 2012-12-30 | Payer: 59 | Source: Ambulatory Visit

## 2013-01-01 ENCOUNTER — Encounter (HOSPITAL_COMMUNITY): Payer: Self-pay | Admitting: Pharmacy Technician

## 2013-01-01 ENCOUNTER — Encounter (HOSPITAL_COMMUNITY)
Admission: RE | Admit: 2013-01-01 | Discharge: 2013-01-01 | Disposition: A | Discharge status: Home / Self Care | Payer: 59 | Source: Ambulatory Visit | Attending: Urology | Admitting: Urology

## 2013-01-01 ENCOUNTER — Encounter (HOSPITAL_COMMUNITY): Payer: Self-pay

## 2013-01-01 DIAGNOSIS — Z01818 Encounter for other preprocedural examination: Secondary | ICD-10-CM | POA: Insufficient documentation

## 2013-01-01 DIAGNOSIS — Z01812 Encounter for preprocedural laboratory examination: Secondary | ICD-10-CM | POA: Insufficient documentation

## 2013-01-01 HISTORY — DX: Personal history of other medical treatment: Z92.89

## 2013-01-01 NOTE — Progress Notes (Signed)
Notified portable equipment who stated would let Trula Ore know about bari bed for 01/04/13 post op

## 2013-01-01 NOTE — Progress Notes (Signed)
Medical clearance note dr Babette Relic spear 12-31-12 on chart

## 2013-01-01 NOTE — Progress Notes (Signed)
01/01/13 0910  OBSTRUCTIVE SLEEP APNEA  Have you ever been diagnosed with sleep apnea through a sleep study? No  Do you snore loudly (loud enough to be heard through closed doors)?  0  Do you often feel tired, fatigued, or sleepy during the daytime? 0  Has anyone observed you stop breathing during your sleep? 0  Do you have, or are you being treated for high blood pressure? 1  BMI more than 35 kg/m2? 1  Age over 59 years old? 1  Neck circumference greater than 40 cm/18 inches? 1  Gender: 1  Obstructive Sleep Apnea Score 5  Score 4 or greater  Results sent to PCP

## 2013-01-01 NOTE — Progress Notes (Signed)
Cbc,Cmet 12/18/12, EKG 12/31/12  ALL ON CHART

## 2013-01-01 NOTE — Patient Instructions (Signed)
20 Aurelius Gildersleeve  01/01/2013   Your procedure is scheduled on:  01/04/13  MONDAY  Report to [facility] at 0845      AM.  Call this number if you have problems the morning of surgery: [phone]       Remember: DO NOT TAKE ANY BLOOD SUGAR MEDICATION MORNING OF SURGERY  Do not eat food  Or drink :After Midnight. Sunday NIGHT   Take these medicines the morning of surgery with A SIP OF WATER: CARVEDILOL,  SERTALINE   .  Contacts, dentures or partial plates can not be worn to surgery  Leave suitcase in the car. After surgery it may be brought to your room.  For patients admitted to the hospital, checkout time is 11:00 AM day of  discharge.             SPECIAL INSTRUCTIONS- SEE [facility] PREPARING FOR SURGERY INSTRUCTION SHEET-     DO NOT WEAR JEWELRY, LOTIONS, POWDERS, OR PERFUMES.  WOMEN-- DO NOT SHAVE LEGS OR UNDERARMS FOR 12 HOURS BEFORE SHOWERS. MEN MAY SHAVE FACE.  Patients discharged the day of surgery will not be allowed to drive home. IF going home the day of surgery, you must have a driver and someone to stay with you for the first 24 hours  Name and phone number of your driver:         Brother or sister in law                                                                                                                                                 [place]  PST [phone]  [phone]                 FAILURE TO FOLLOW THESE INSTRUCTIONS MAY RESULT IN  CANCELLATION   OF YOUR SURGERY                                                  Patient Signature _____________________________

## 2013-01-03 MED ORDER — GENTAMICIN SULFATE 40 MG/ML IJ SOLN
610.0000 mg | Freq: Once | INTRAVENOUS | Status: AC
  Administered 2013-01-04: 610 mg via INTRAVENOUS
  Filled 2013-01-03: qty 15.25

## 2013-01-04 ENCOUNTER — Inpatient Hospital Stay (HOSPITAL_COMMUNITY): Payer: 59

## 2013-01-04 ENCOUNTER — Encounter (HOSPITAL_COMMUNITY): Payer: Self-pay | Admitting: Anesthesiology

## 2013-01-04 ENCOUNTER — Encounter (HOSPITAL_COMMUNITY): Payer: Self-pay | Admitting: *Deleted

## 2013-01-04 ENCOUNTER — Inpatient Hospital Stay (HOSPITAL_COMMUNITY): Payer: 59 | Admitting: Anesthesiology

## 2013-01-04 ENCOUNTER — Inpatient Hospital Stay (HOSPITAL_COMMUNITY)
Admission: RE | Admit: 2013-01-04 | Discharge: 2013-01-11 | DRG: 660 | Disposition: A | Discharge status: Home Health | Payer: 59 | Source: Ambulatory Visit | Attending: Urology | Admitting: Urology

## 2013-01-04 ENCOUNTER — Encounter (HOSPITAL_COMMUNITY)
Admission: RE | Disposition: A | Discharge status: Home Health | Payer: Self-pay | Source: Ambulatory Visit | Attending: Urology

## 2013-01-04 DIAGNOSIS — Y838 Other surgical procedures as the cause of abnormal reaction of the patient, or of later complication, without mention of misadventure at the time of the procedure: Secondary | ICD-10-CM | POA: Diagnosis present

## 2013-01-04 DIAGNOSIS — Z01812 Encounter for preprocedural laboratory examination: Secondary | ICD-10-CM

## 2013-01-04 DIAGNOSIS — N201 Calculus of ureter: Secondary | ICD-10-CM | POA: Diagnosis present

## 2013-01-04 DIAGNOSIS — N1330 Unspecified hydronephrosis: Secondary | ICD-10-CM | POA: Diagnosis present

## 2013-01-04 DIAGNOSIS — C187 Malignant neoplasm of sigmoid colon: Secondary | ICD-10-CM | POA: Diagnosis present

## 2013-01-04 DIAGNOSIS — Z9049 Acquired absence of other specified parts of digestive tract: Secondary | ICD-10-CM

## 2013-01-04 DIAGNOSIS — N200 Calculus of kidney: Principal | ICD-10-CM | POA: Diagnosis present

## 2013-01-04 DIAGNOSIS — I10 Essential (primary) hypertension: Secondary | ICD-10-CM | POA: Diagnosis present

## 2013-01-04 DIAGNOSIS — R112 Nausea with vomiting, unspecified: Secondary | ICD-10-CM | POA: Diagnosis not present

## 2013-01-04 DIAGNOSIS — T8189XA Other complications of procedures, not elsewhere classified, initial encounter: Secondary | ICD-10-CM | POA: Diagnosis present

## 2013-01-04 DIAGNOSIS — E119 Type 2 diabetes mellitus without complications: Secondary | ICD-10-CM | POA: Diagnosis present

## 2013-01-04 DIAGNOSIS — Z6841 Body Mass Index (BMI) 40.0 and over, adult: Secondary | ICD-10-CM

## 2013-01-04 HISTORY — PX: HOLMIUM LASER APPLICATION: SHX5852

## 2013-01-04 HISTORY — PX: CYSTOSCOPY W/ URETERAL STENT PLACEMENT: SHX1429

## 2013-01-04 HISTORY — PX: NEPHROLITHOTOMY: SHX5134

## 2013-01-04 LAB — BASIC METABOLIC PANEL
BUN: 12 mg/dL (ref 6–23)
CO2: 27 mEq/L (ref 19–32)
Chloride: 101 mEq/L (ref 96–112)
Glucose, Bld: 208 mg/dL — ABNORMAL HIGH (ref 70–99)
Potassium: 5.4 mEq/L — ABNORMAL HIGH (ref 3.5–5.1)
Sodium: 137 mEq/L (ref 135–145)

## 2013-01-04 LAB — GLUCOSE, CAPILLARY
Glucose-Capillary: 201 mg/dL — ABNORMAL HIGH (ref 70–99)
Glucose-Capillary: 154 mg/dL — ABNORMAL HIGH (ref 70–99)
Glucose-Capillary: 177 mg/dL — ABNORMAL HIGH (ref 70–99)

## 2013-01-04 LAB — TYPE AND SCREEN
ABO/RH(D): O POS
Antibody Screen: NEGATIVE

## 2013-01-04 LAB — HEMOGLOBIN AND HEMATOCRIT, BLOOD: HCT: 39.6 % (ref 39.0–52.0)

## 2013-01-04 SURGERY — NEPHROLITHOTOMY PERCUTANEOUS
Anesthesia: General | Site: Ureter | Wound class: Clean Contaminated

## 2013-01-04 MED ORDER — SUCCINYLCHOLINE CHLORIDE 20 MG/ML IJ SOLN
INTRAMUSCULAR | Status: DC | PRN
  Administered 2013-01-04: 200 mg via INTRAVENOUS

## 2013-01-04 MED ORDER — PROPOFOL 10 MG/ML IV BOLUS
INTRAVENOUS | Status: DC | PRN
  Administered 2013-01-04: 300 mg via INTRAVENOUS

## 2013-01-04 MED ORDER — GLYCOPYRROLATE 0.2 MG/ML IJ SOLN
INTRAMUSCULAR | Status: DC | PRN
  Administered 2013-01-04: .8 mg via INTRAVENOUS

## 2013-01-04 MED ORDER — FENTANYL CITRATE 0.05 MG/ML IJ SOLN
INTRAMUSCULAR | Status: DC | PRN
  Administered 2013-01-04 (×2): 50 ug via INTRAVENOUS

## 2013-01-04 MED ORDER — SERTRALINE HCL 50 MG PO TABS
150.0000 mg | ORAL_TABLET | Freq: Every day | ORAL | Status: DC
  Administered 2013-01-05 – 2013-01-11 (×6): 150 mg via ORAL
  Filled 2013-01-04 (×8): qty 1

## 2013-01-04 MED ORDER — SUFENTANIL CITRATE 50 MCG/ML IV SOLN
INTRAVENOUS | Status: DC | PRN
  Administered 2013-01-04 (×5): 10 ug via INTRAVENOUS

## 2013-01-04 MED ORDER — CISATRACURIUM BESYLATE (PF) 10 MG/5ML IV SOLN
INTRAVENOUS | Status: DC | PRN
  Administered 2013-01-04: 6 mg via INTRAVENOUS
  Administered 2013-01-04: 14 mg via INTRAVENOUS
  Administered 2013-01-04: 4 mg via INTRAVENOUS
  Administered 2013-01-04: 6 mg via INTRAVENOUS
  Administered 2013-01-04: 2 mg via INTRAVENOUS

## 2013-01-04 MED ORDER — INSULIN ASPART 100 UNIT/ML ~~LOC~~ SOLN
0.0000 [IU] | Freq: Three times a day (TID) | SUBCUTANEOUS | Status: DC
Start: 2013-01-04 — End: 2013-01-11
  Administered 2013-01-05 (×2): 4 [IU] via SUBCUTANEOUS
  Administered 2013-01-06: 3 [IU] via SUBCUTANEOUS
  Administered 2013-01-07: 4 [IU] via SUBCUTANEOUS
  Administered 2013-01-07: 3 [IU] via SUBCUTANEOUS
  Administered 2013-01-07: 4 [IU] via SUBCUTANEOUS
  Administered 2013-01-08: 3 [IU] via SUBCUTANEOUS
  Administered 2013-01-08 – 2013-01-09 (×4): 4 [IU] via SUBCUTANEOUS
  Administered 2013-01-09: 3 [IU] via SUBCUTANEOUS
  Administered 2013-01-10 (×2): 4 [IU] via SUBCUTANEOUS
  Administered 2013-01-10: 3 [IU] via SUBCUTANEOUS
  Administered 2013-01-11 (×2): 4 [IU] via SUBCUTANEOUS

## 2013-01-04 MED ORDER — FENTANYL CITRATE 0.05 MG/ML IJ SOLN
25.0000 ug | INTRAMUSCULAR | Status: DC | PRN
  Administered 2013-01-04: 50 ug via INTRAVENOUS

## 2013-01-04 MED ORDER — FENTANYL CITRATE 0.05 MG/ML IJ SOLN
INTRAMUSCULAR | Status: AC
  Filled 2013-01-04: qty 2

## 2013-01-04 MED ORDER — PNEUMOCOCCAL VAC POLYVALENT 25 MCG/0.5ML IJ INJ
0.5000 mL | INJECTION | INTRAMUSCULAR | Status: AC
  Administered 2013-01-05: 0.5 mL via INTRAMUSCULAR
  Filled 2013-01-04 (×2): qty 0.5

## 2013-01-04 MED ORDER — FENTANYL CITRATE 0.05 MG/ML IJ SOLN
25.0000 ug | INTRAMUSCULAR | Status: DC | PRN
  Administered 2013-01-04: 25 ug via INTRAVENOUS
  Administered 2013-01-04: 50 ug via INTRAVENOUS
  Administered 2013-01-04: 25 ug via INTRAVENOUS
  Administered 2013-01-04: 50 ug via INTRAVENOUS

## 2013-01-04 MED ORDER — SENNA 8.6 MG PO TABS
1.0000 | ORAL_TABLET | Freq: Two times a day (BID) | ORAL | Status: DC
  Administered 2013-01-04 – 2013-01-09 (×10): 8.6 mg via ORAL
  Filled 2013-01-04 (×11): qty 1

## 2013-01-04 MED ORDER — POTASSIUM CHLORIDE IN NACL 20-0.9 MEQ/L-% IV SOLN
INTRAVENOUS | Status: DC
  Administered 2013-01-04 – 2013-01-07 (×5): via INTRAVENOUS
  Filled 2013-01-04 (×12): qty 1000

## 2013-01-04 MED ORDER — INSULIN ASPART 100 UNIT/ML ~~LOC~~ SOLN
4.0000 [IU] | Freq: Three times a day (TID) | SUBCUTANEOUS | Status: DC
  Administered 2013-01-05 – 2013-01-11 (×16): 4 [IU] via SUBCUTANEOUS

## 2013-01-04 MED ORDER — MIDAZOLAM HCL 5 MG/5ML IJ SOLN
INTRAMUSCULAR | Status: DC | PRN
  Administered 2013-01-04: 1 mg via INTRAVENOUS

## 2013-01-04 MED ORDER — INSULIN ASPART 100 UNIT/ML ~~LOC~~ SOLN
SUBCUTANEOUS | Status: AC
  Administered 2013-01-04: 7 [IU]
  Filled 2013-01-04: qty 1

## 2013-01-04 MED ORDER — ONDANSETRON HCL 4 MG/2ML IJ SOLN
4.0000 mg | INTRAMUSCULAR | Status: DC | PRN
  Administered 2013-01-05 (×2): 4 mg via INTRAVENOUS
  Filled 2013-01-04 (×2): qty 2

## 2013-01-04 MED ORDER — PROMETHAZINE HCL 25 MG/ML IJ SOLN: 6.2500 mg | INTRAMUSCULAR | Status: DC | PRN

## 2013-01-04 MED ORDER — ONDANSETRON HCL 4 MG/2ML IJ SOLN
INTRAMUSCULAR | Status: DC | PRN
  Administered 2013-01-04: 4 mg via INTRAVENOUS

## 2013-01-04 MED ORDER — ACETAMINOPHEN 500 MG PO TABS
1000.0000 mg | ORAL_TABLET | Freq: Four times a day (QID) | ORAL | Status: AC
  Administered 2013-01-04 – 2013-01-05 (×3): 1000 mg via ORAL
  Filled 2013-01-04 (×4): qty 2

## 2013-01-04 MED ORDER — DOCUSATE SODIUM 100 MG PO CAPS
100.0000 mg | ORAL_CAPSULE | Freq: Two times a day (BID) | ORAL | Status: DC
  Administered 2013-01-04 – 2013-01-10 (×11): 100 mg via ORAL
  Filled 2013-01-04 (×16): qty 1

## 2013-01-04 MED ORDER — NEOSTIGMINE METHYLSULFATE 1 MG/ML IJ SOLN
INTRAMUSCULAR | Status: DC | PRN
  Administered 2013-01-04: 5 mg via INTRAVENOUS

## 2013-01-04 MED ORDER — OXYCODONE HCL 5 MG PO TABS
5.0000 mg | ORAL_TABLET | ORAL | Status: DC | PRN
Start: 2013-01-04 — End: 2013-01-11
  Administered 2013-01-04 – 2013-01-10 (×16): 5 mg via ORAL
  Filled 2013-01-04 (×16): qty 1

## 2013-01-04 MED ORDER — STERILE WATER FOR IRRIGATION IR SOLN
Status: DC | PRN
  Administered 2013-01-04: 1000 mL

## 2013-01-04 MED ORDER — SODIUM CHLORIDE 0.9 % IR SOLN
Status: DC | PRN
  Administered 2013-01-04: 17000 mL

## 2013-01-04 MED ORDER — LACTATED RINGERS IV SOLN
INTRAVENOUS | Status: DC
  Administered 2013-01-04: 13:00:00 via INTRAVENOUS
  Administered 2013-01-04: 1000 mL via INTRAVENOUS

## 2013-01-04 MED ORDER — ATORVASTATIN CALCIUM 40 MG PO TABS
40.0000 mg | ORAL_TABLET | Freq: Every day | ORAL | Status: DC
  Administered 2013-01-04 – 2013-01-10 (×7): 40 mg via ORAL
  Filled 2013-01-04 (×8): qty 1

## 2013-01-04 MED ORDER — CARVEDILOL 6.25 MG PO TABS
6.2500 mg | ORAL_TABLET | Freq: Two times a day (BID) | ORAL | Status: DC
  Administered 2013-01-05 – 2013-01-11 (×12): 6.25 mg via ORAL
  Filled 2013-01-04 (×15): qty 1

## 2013-01-04 MED ORDER — LIDOCAINE HCL (CARDIAC) 20 MG/ML IV SOLN
INTRAVENOUS | Status: DC | PRN
  Administered 2013-01-04: 100 mg via INTRAVENOUS

## 2013-01-04 MED ORDER — HYDROMORPHONE HCL PF 1 MG/ML IJ SOLN
0.5000 mg | INTRAMUSCULAR | Status: DC | PRN
  Administered 2013-01-04 – 2013-01-05 (×6): 1 mg via INTRAVENOUS
  Filled 2013-01-04 (×6): qty 1

## 2013-01-04 MED ORDER — IOHEXOL 300 MG/ML  SOLN
INTRAMUSCULAR | Status: DC | PRN
  Administered 2013-01-04: 100 mL

## 2013-01-04 MED ORDER — ATORVASTATIN CALCIUM 40 MG PO TABS
40.0000 mg | ORAL_TABLET | Freq: Every day | ORAL | Status: DC
  Filled 2013-01-04: qty 1

## 2013-01-04 SURGICAL SUPPLY — 61 items
BAG URINE DRAINAGE (UROLOGICAL SUPPLIES) ×8 IMPLANT
BASKET ZERO TIP NITINOL 2.4FR (BASKET) ×4 IMPLANT
BENZOIN TINCTURE PRP APPL 2/3 (GAUZE/BANDAGES/DRESSINGS) ×8 IMPLANT
BLADE SURG 15 STRL LF DISP TIS (BLADE) ×3 IMPLANT
BLADE SURG 15 STRL SS (BLADE) ×1
CATH BEACON 5.038 65CM KMP-01 (CATHETERS) ×4 IMPLANT
CATH FOLEY 2W COUNCIL 20FR 5CC (CATHETERS) IMPLANT
CATH FOLEY 2WAY SLVR  5CC 16FR (CATHETERS) ×1
CATH FOLEY 2WAY SLVR 5CC 16FR (CATHETERS) ×3 IMPLANT
CATH INTERMIT  6FR 70CM (CATHETERS) ×4 IMPLANT
CATH MULTI PURPOSE 16FR DRAIN (STENTS) ×4 IMPLANT
CATH ROBINSON RED A/P 20FR (CATHETERS) IMPLANT
CATH X-FORCE N30 NEPHROSTOMY (TUBING) ×4 IMPLANT
CHLORAPREP W/TINT 26ML (MISCELLANEOUS) ×4 IMPLANT
CLOTH BEACON ORANGE TIMEOUT ST (SAFETY) ×4 IMPLANT
COVER SURGICAL LIGHT HANDLE (MISCELLANEOUS) ×4 IMPLANT
DRAPE C-ARM 42X120 X-RAY (DRAPES) ×4 IMPLANT
DRAPE CAMERA CLOSED 9X96 (DRAPES) ×8 IMPLANT
DRAPE LG THREE QUARTER DISP (DRAPES) ×4 IMPLANT
DRAPE LINGEMAN PERC (DRAPES) ×4 IMPLANT
DRAPE SURG IRRIG POUCH 19X23 (DRAPES) ×4 IMPLANT
DRSG TEGADERM 8X12 (GAUZE/BANDAGES/DRESSINGS) ×8 IMPLANT
FIBER LASER FLEXIVA 200 (UROLOGICAL SUPPLIES) ×4 IMPLANT
FIBER LASER FLEXIVA 365 (UROLOGICAL SUPPLIES) ×4 IMPLANT
GLOVE BIOGEL M STRL SZ7.5 (GLOVE) ×28 IMPLANT
GOWN STRL REIN XL XLG (GOWN DISPOSABLE) ×12 IMPLANT
GUIDEWIRE AMPLAZ .035X145 (WIRE) ×8 IMPLANT
GUIDEWIRE ANG ZIPWIRE 038X150 (WIRE) ×8 IMPLANT
GUIDEWIRE STR DUAL SENSOR (WIRE) ×8 IMPLANT
HOVERMATT SINGLE USE (MISCELLANEOUS) ×4 IMPLANT
KIT BASIN OR (CUSTOM PROCEDURE TRAY) ×4 IMPLANT
LASER FIBER DISP (UROLOGICAL SUPPLIES) IMPLANT
LASER FIBER DISP 1000U (UROLOGICAL SUPPLIES) IMPLANT
MANIFOLD NEPTUNE II (INSTRUMENTS) ×4 IMPLANT
NEEDLE TROCAR 18X15 ECHO (NEEDLE) IMPLANT
NEEDLE TROCAR 18X20 (NEEDLE) ×4 IMPLANT
NS IRRIG 1000ML POUR BTL (IV SOLUTION) ×4 IMPLANT
PACK BASIC VI WITH GOWN DISP (CUSTOM PROCEDURE TRAY) ×4 IMPLANT
PACK CYSTO (CUSTOM PROCEDURE TRAY) ×4 IMPLANT
PAD ABD 7.5X8 STRL (GAUZE/BANDAGES/DRESSINGS) ×8 IMPLANT
PROBE LITHOCLAST ULTRA 3.8X403 (UROLOGICAL SUPPLIES) IMPLANT
PROBE PNEUMATIC 1.0MMX570MM (UROLOGICAL SUPPLIES) IMPLANT
SET IRRIG Y TYPE TUR BLADDER L (SET/KITS/TRAYS/PACK) ×4 IMPLANT
SET WARMING FLUID IRRIGATION (MISCELLANEOUS) ×4 IMPLANT
SHEATH ACCESS URETERAL 24CM (SHEATH) ×4 IMPLANT
SHEATH PEELAWAY SET 9 (SHEATH) ×4 IMPLANT
SHEATH X FORCE 10MMX22CM (SHEATH) ×4 IMPLANT
SPONGE GAUZE 4X4 12PLY (GAUZE/BANDAGES/DRESSINGS) ×4 IMPLANT
SPONGE LAP 4X18 X RAY DECT (DISPOSABLE) ×4 IMPLANT
STENT CONTOUR 6FRX26X.038 (STENTS) ×4 IMPLANT
STONE CATCHER W/TUBE ADAPTER (UROLOGICAL SUPPLIES) IMPLANT
SUT SILK 2 0 (SUTURE) ×1
SUT SILK 2 0 30  PSL (SUTURE) ×1
SUT SILK 2 0 30 PSL (SUTURE) ×3 IMPLANT
SUT SILK 2-0 30XBRD TIE 12 (SUTURE) ×3 IMPLANT
SYR 20CC LL (SYRINGE) ×8 IMPLANT
SYRINGE 10CC LL (SYRINGE) ×4 IMPLANT
TOWEL OR 17X26 10 PK STRL BLUE (TOWEL DISPOSABLE) ×4 IMPLANT
TUBE CONNECTING VINYL 14FR 30C (MISCELLANEOUS) ×4 IMPLANT
TUBING CONNECTING 10 (TUBING) ×12 IMPLANT
WATER STERILE IRR 1500ML POUR (IV SOLUTION) IMPLANT

## 2013-01-04 NOTE — Anesthesia Procedure Notes (Signed)
Procedure Name: Intubation Date/Time: 01/04/2013 11:44 AM Performed by: Leroy Libman L Patient Re-evaluated:Patient Re-evaluated prior to inductionOxygen Delivery Method: Circle system utilized Preoxygenation: Pre-oxygenation with 100% oxygen Intubation Type: IV induction Ventilation: Mask ventilation without difficulty and Oral airway inserted - appropriate to patient size Grade View: Grade I Tube type: Oral Tube size: 8.0 mm Number of attempts: 1 Airway Equipment and Method: Video-laryngoscopy Placement Confirmation: ETT inserted through vocal cords under direct vision,  positive ETCO2 and breath sounds checked- equal and bilateral Secured at: 22 cm Tube secured with: Tape Dental Injury: Teeth and Oropharynx as per pre-operative assessment  Comments: Glidescope used because of patient's body habitus

## 2013-01-04 NOTE — Anesthesia Postprocedure Evaluation (Signed)
  Anesthesia Post-op Note  Patient: Brandon Schaefer  Procedure(s) Performed: Procedure(s) (LRB): NEPHROLITHOTOMY PERCUTANEOUS  LEFT 1ST STAGE PERCUTANEOUS NEPHROSTOLITHOTOMY (Left) CYSTOSCOPY WITH RETROGRADE PYELOGRAM/Right double J URETERAL STENT PLACEMENT (Bilateral) HOLMIUM LASER APPLICATION (N/A)  Patient Location: PACU  Anesthesia Type: General  Level of Consciousness: awake and alert   Airway and Oxygen Therapy: Patient Spontanous Breathing  Post-op Pain: mild  Post-op Assessment: Post-op Vital signs reviewed, Patient's Cardiovascular Status Stable, Respiratory Function Stable, Patent Airway and No signs of Nausea or vomiting  Last Vitals:  Filed Vitals:   01/04/13 1700  BP: 137/67  Pulse: 101  Temp:   Resp: 20    Post-op Vital Signs: stable   Complications: No apparent anesthesia complications

## 2013-01-04 NOTE — Preoperative (Signed)
Beta Blockers   Reason not to administer Beta Blockers:Not Applicable 

## 2013-01-04 NOTE — H&P (Signed)
Brandon Schaefer is an 57 y.o. male.    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 Stone 1000HU density wtih skin-stone distance 18cm. No prior stones.  Most recent Cr 1.0.  PMH sig for colon cancer ((s/p surgery, chemo), morbid obesity (400lb), DM2. No CV disease.   Today [name] is seen to proceed with first stage surgery fo rhis large volume urolithaisis. He has pre-op clearance by Dr. Collins [place]. Dr. Magnus Ivan is planing on revising abd wound on Wednesday during second stage procedure.   Past Medical History  Diagnosis Date  . Hypertension   . Diabetes mellitus   . Allergy   . Hyperlipidemia   . Anemia   . Cancer   . Anxiety   . Shortness of breath     with exertion  . Headache(784.0)   . Neuromuscular disorder     peripheral neuropathy  . History of blood transfusion     Past Surgical History  Procedure Laterality Date  . Fracture surgery       ORIF-left radius has pin  . Pilonidal cyst excision    . Partial colectomy  01/17/2012    Procedure: PARTIAL COLECTOMY;  Surgeon: Shelly Rubenstein, MD;  Location: [facility];  Service: General;;  . Proctoscopy  01/17/2012    Procedure: PROCTOSCOPY;  Surgeon: Shelly Rubenstein, MD;  Location: [facility];  Service: General;;  . Portacath placement  03/19/2012    Procedure: INSERTION PORT-A-CATH;  Surgeon: Shelly Rubenstein, MD;  Location: [facility];  Service: General;  Laterality: N/A;    Family History  Problem Relation Age of Onset  . Diabetes Father   . Cancer Maternal Aunt     colon  . Cancer Maternal Grandmother     colon   Social History:  reports that he has never smoked. He has never used smokeless tobacco. He reports that he does not drink  alcohol or use illicit drugs.  Allergies:  Allergies  Allergen Reactions  . Adhesive [Tape]     Medications Prior to Admission  Medication Sig Dispense Refill  . carvedilol (COREG) 6.25 MG tablet Take 6.25 mg by mouth 2 (two) times daily with a meal.      . metFORMIN (GLUCOPHAGE) 1000 MG tablet Take 1,000 mg by mouth daily with breakfast.       . pioglitazone-metformin (ACTOPLUS MET) 15-850 MG per tablet Take 1 tablet by mouth 2 (two) times daily with a meal.      . ramipril (ALTACE) 10 MG capsule Take 10 mg by mouth daily before breakfast.      . rosuvastatin (CRESTOR) 20 MG tablet Take 20 mg by mouth daily before breakfast.      . sertraline (ZOLOFT) 100 MG tablet Take 150 mg by mouth daily before breakfast.      . TRADJENTA 5 MG TABS tablet Take by mouth daily.         Results for orders placed during the hospital encounter of 01/04/13 (from the past 48 hour(s))  GLUCOSE, CAPILLARY     Status: Abnormal   Collection Time    01/04/13  8:50 AM      Result Value Range   Glucose-Capillary 185 (*) 70 - 99 mg/dL  Comment 1 Notify RN     No results found.  Review of Systems  Constitutional: Negative for fever and chills.  HENT: Negative.   Eyes: Negative.   Respiratory: Negative.   Cardiovascular: Negative.   Gastrointestinal: Negative.        Chronic abd wound  Genitourinary: Negative.   Musculoskeletal: Negative.   Skin: Negative.   Neurological: Negative.   Endo/Heme/Allergies: Negative.   Psychiatric/Behavioral: Negative.     Blood pressure 159/71, pulse 91, temperature 98 F (36.7 C), temperature source Oral, resp. rate 15, SpO2 98.00%. Physical Exam  Constitutional: He is oriented to person, place, and time. He appears well-developed and well-nourished.  Morbid obesity  HENT:  Head: Normocephalic and atraumatic.  Eyes: EOM are normal. Pupils are equal, round, and reactive to light.  Neck: Normal range of motion. Neck supple.  Cardiovascular: Normal rate.    Respiratory: Effort normal.  GI: Soft. Bowel sounds are normal.  Chronic abd wound of skin only.   Genitourinary: Penis normal.  Musculoskeletal: Normal range of motion.  Neurological: He is alert and oriented to person, place, and time.  Skin: Skin is warm and dry.  Psychiatric: He has a normal mood and affect. His behavior is normal. Judgment and thought content normal.     Assessment/Plan  1 - Left > Right Nephrolithaisis -  We rediscussed percutaneous nephrostolithotomy (PCNL) in detail including need for percutaneous access which is sometimes gained by the surgeon, and other times by radiology or through existing nephrostomy tubes if present. We specifically rediscussed that often times tubes remain in after surgery until we are confident all stone has been treated. We rementioned that staged surgery is needed in over 50% of cases of very large or complex stone. We then rediscussed general risks including bleeding, infection, damage to kidney / ureter / bladder, loss of kidney, as well as anesthetic risks and rare but serious surgical complications including DVT, PE, MI, and mortality. We also discussed risks of bilateral surgery adnthe fact he will go home with stents in place.   Proceed today as planned.    Renji Berwick 01/04/2013, 8:58 AM

## 2013-01-04 NOTE — Transfer of Care (Signed)
Immediate Anesthesia Transfer of Care Note  Patient: Brandon Schaefer  Procedure(s) Performed: Procedure(s) (LRB): NEPHROLITHOTOMY PERCUTANEOUS  LEFT 1ST STAGE PERCUTANEOUS NEPHROSTOLITHOTOMY (Left) CYSTOSCOPY WITH RETROGRADE PYELOGRAM/Right double J URETERAL STENT PLACEMENT (Bilateral) HOLMIUM LASER APPLICATION (N/A)  Patient Location: PACU  Anesthesia Type: General  Level of Consciousness: sedated, patient cooperative and responds to stimulation  Airway & Oxygen Therapy: Patient Spontanous Breathing and Patient connected to face mask oxgen  Post-op Assessment: Report given to PACU RN and Post -op Vital signs reviewed and stable  Post vital signs: Reviewed and stable  Complications: No apparent anesthesia complications

## 2013-01-04 NOTE — Brief Op Note (Signed)
01/04/2013  3:52 PM  PATIENT:  Brandon Schaefer  57 y.o. male  PRE-OPERATIVE DIAGNOSIS:  LEFT STAGHORN, RIGHT SMALL RENAL STONES  POST-OPERATIVE DIAGNOSIS:  left staghorn, right small renal stones  PROCEDURE:  Procedure(s): NEPHROLITHOTOMY PERCUTANEOUS  LEFT 1ST STAGE PERCUTANEOUS NEPHROSTOLITHOTOMY (Left) CYSTOSCOPY WITH RETROGRADE PYELOGRAM/Right double J URETERAL STENT PLACEMENT (Bilateral) HOLMIUM LASER APPLICATION (N/A)  SURGEON:  Surgeon(s) and Role:    * Sebastian Ache, MD - Primary  PHYSICIAN ASSISTANT:   ASSISTANTS: none   ANESTHESIA:   general  EBL:  Total I/O In: 1000 [I.V.:1000] Out: -   BLOOD ADMINISTERED:none  DRAINS: 1 - Left Nephrostomy to straight drain. 2 - Left Nephroureteral stent caped, 3 - Foley to straight drain   LOCAL MEDICATIONS USED:  NONE  SPECIMEN:  Source of Specimen:  Left ureteral and Renal Stones  DISPOSITION OF SPECIMEN:  [facility] for compositional analysis  COUNTS:  YES  TOURNIQUET:  * No tourniquets in log *  DICTATION: .Other Dictation: Dictation Number   unsure, disconnected  PLAN OF CARE: Admit to inpatient   PATIENT DISPOSITION:  PACU - hemodynamically stable.   Delay start of Pharmacological VTE agent (>24hrs) due to surgical blood loss or risk of bleeding: yes

## 2013-01-04 NOTE — Anesthesia Preprocedure Evaluation (Addendum)
Anesthesia Evaluation  Patient identified by MRN, date of birth, ID band Patient awake    Reviewed: Allergy & Precautions, H&P , NPO status , Patient's Chart, lab work & pertinent test results  Airway Mallampati: III TM Distance: <3 FB Neck ROM: Full    Dental  (+) Poor Dentition, Missing and Dental Advisory Given   Pulmonary sleep apnea ,  breath sounds clear to auscultation  + decreased breath sounds      Cardiovascular hypertension, Pt. on medications Rhythm:Regular Rate:Normal     Neuro/Psych negative neurological ROS  negative psych ROS   GI/Hepatic negative GI ROS, Neg liver ROS,   Endo/Other  diabetes, Oral Hypoglycemic AgentsMorbid obesity  Renal/GU negative Renal ROS  negative genitourinary   Musculoskeletal negative musculoskeletal ROS (+)   Abdominal   Peds negative pediatric ROS (+)  Hematology negative hematology ROS (+)   Anesthesia Other Findings   Reproductive/Obstetrics negative OB ROS                          Anesthesia Physical Anesthesia Plan  ASA: IV  Anesthesia Plan: General   Post-op Pain Management:    Induction: Intravenous  Airway Management Planned: Oral ETT  Additional Equipment:   Intra-op Plan:   Post-operative Plan: Possible Post-op intubation/ventilation  Informed Consent: I have reviewed the patients History and Physical, chart, labs and discussed the procedure including the risks, benefits and alternatives for the proposed anesthesia with the patient or authorized representative who has indicated his/her understanding and acceptance.   Dental advisory given  Plan Discussed with: CRNA and Surgeon  Anesthesia Plan Comments:         Anesthesia Quick Evaluation

## 2013-01-05 ENCOUNTER — Other Ambulatory Visit: Payer: Self-pay | Admitting: Urology

## 2013-01-05 LAB — GLUCOSE, CAPILLARY
Glucose-Capillary: 180 mg/dL — ABNORMAL HIGH (ref 70–99)
Glucose-Capillary: 160 mg/dL — ABNORMAL HIGH (ref 70–99)
Glucose-Capillary: 140 mg/dL — ABNORMAL HIGH (ref 70–99)
Glucose-Capillary: 155 mg/dL — ABNORMAL HIGH (ref 70–99)

## 2013-01-05 LAB — BASIC METABOLIC PANEL
BUN: 13 mg/dL (ref 6–23)
CO2: 26 mEq/L (ref 19–32)
Chloride: 104 mEq/L (ref 96–112)
GFR calc non Af Amer: >90 mL/min (ref >90)
Glucose, Bld: 163 mg/dL — ABNORMAL HIGH (ref 70–99)
Potassium: 4.7 mEq/L (ref 3.5–5.1)
Sodium: 139 mEq/L (ref 135–145)

## 2013-01-05 LAB — HEMOGLOBIN AND HEMATOCRIT, BLOOD: HCT: 34.8 % — ABNORMAL LOW (ref 39.0–52.0)

## 2013-01-05 MED ORDER — PROMETHAZINE HCL 25 MG/ML IJ SOLN
25.0000 mg | INTRAMUSCULAR | Status: DC | PRN
  Administered 2013-01-05 – 2013-01-09 (×4): 25 mg via INTRAVENOUS
  Filled 2013-01-05 (×4): qty 1

## 2013-01-05 MED ORDER — FENTANYL CITRATE 0.05 MG/ML IJ SOLN
50.0000 ug | INTRAMUSCULAR | Status: DC | PRN
  Administered 2013-01-05 – 2013-01-11 (×19): 50 ug via INTRAVENOUS
  Filled 2013-01-05 (×19): qty 2

## 2013-01-05 MED ORDER — KETOROLAC TROMETHAMINE 30 MG/ML IJ SOLN
30.0000 mg | Freq: Three times a day (TID) | INTRAMUSCULAR | Status: AC
  Administered 2013-01-05 – 2013-01-10 (×15): 30 mg via INTRAVENOUS
  Filled 2013-01-05 (×19): qty 1

## 2013-01-05 NOTE — Anesthesia Preprocedure Evaluation (Addendum)
Anesthesia Evaluation  Patient identified by MRN, date of birth, ID band Patient awake    Reviewed: Allergy & Precautions, H&P , NPO status , Patient's Chart, lab work & pertinent test results  Airway Mallampati: III TM Distance: >3 FB Neck ROM: Full    Dental  (+) Teeth Intact and Partial Upper,    Pulmonary shortness of breath, with exertion and at rest,  History of post-operative respiratory failure breath sounds clear to auscultation  Pulmonary exam normal       Cardiovascular hypertension, Pt. on medications and Pt. on home beta blockers Rhythm:Regular Rate:Normal     Neuro/Psych  Headaches, Anxiety  Neuromuscular disease negative psych ROS   GI/Hepatic Neg liver ROS, Neoplasm Colon   Endo/Other  diabetes, Poorly Controlled, Type 2, Oral Hypoglycemic AgentsMorbid obesity  Renal/GU negative Renal ROS  negative genitourinary   Musculoskeletal negative musculoskeletal ROS (+)   Abdominal (+) + obese,   Peds  Hematology negative hematology ROS (+)   Anesthesia Other Findings   Reproductive/Obstetrics negative OB ROS                         Anesthesia Physical Anesthesia Plan  ASA: IV  Anesthesia Plan: General   Post-op Pain Management:    Induction: Intravenous  Airway Management Planned: Oral ETT  Additional Equipment:   Intra-op Plan:   Post-operative Plan: Extubation in OR  Informed Consent: I have reviewed the patients History and Physical, chart, labs and discussed the procedure including the risks, benefits and alternatives for the proposed anesthesia with the patient or authorized representative who has indicated his/her understanding and acceptance.   Dental advisory given  Plan Discussed with: CRNA  Anesthesia Plan Comments:        Anesthesia Quick Evaluation

## 2013-01-05 NOTE — Progress Notes (Signed)
1 Day Post-Op  Subjective:  1 - Left > Right Nephrolithaisis - Pt with Left 2cm renal + 6mm lower pole + 9mm prox ureteral stone wtih moderate hydro as well as right punctate stones incidetnal on CT for abdominal wound / colon cancer surveillance. Stone 1000HU density wtih skin-stone distance 18cm. Now s/p first stage left percutaneous nephrostolithotomy on 9/22 with plan for second stage procedure tomorrow to address residual left sided as well as rt sided stones in conjuction with Dr. Magnus Ivan who will be revising abd wound and removing port-a-cath.  Today Brandon Schaefer is seen post-op. He has had bothersome nausea with emesis throughout the day that seems temporally related to pain med administration. No fevers.    Objective: Vital signs in last 24 hours: Temp:  [97.9 F (36.6 C)-98.2 F (36.8 C)] 98.1 F (36.7 C) (09/23 1434) Pulse Rate:  [90-107] 90 (09/23 1434) Resp:  [18-21] 20 (09/23 1434) BP: (117-148)/(51-60) 127/58 mmHg (09/23 1434) SpO2:  [94 %-97 %] 97 % (09/23 1434) Last BM Date: 01/04/13  Intake/Output from previous day: 09/22 0701 - 09/23 0700 In: 2425 [I.V.:2425] Out: 1155 [Urine:1155] Intake/Output this shift: Total I/O In: 180 [P.O.:180] Out: 700 [Urine:700]  General appearance: alert, cooperative, appears stated age and morbidly obese Head: Normocephalic, without obvious abnormality, atraumatic Eyes: conjunctivae/corneas clear. PERRL, EOM's intact. Fundi benign. Ears: normal TM's and external ear canals both ears Nose: Nares normal. Septum midline. Mucosa normal. No drainage or sinus tenderness. Throat: lips, mucosa, and tongue normal; teeth and gums normal Neck: no adenopathy, no carotid bruit, no JVD, supple, symmetrical, trachea midline and thyroid not enlarged, symmetric, no tenderness/mass/nodules Back: symmetric, no curvature. ROM normal. No CVA tenderness. Resp: clear to auscultation bilaterally Chest wall: no tenderness GI: soft, non-tender; bowel sounds  normal; no masses,  no organomegaly and abd woudn covered wtih dry dressing. Male genitalia: normal, foley c/d/i with blodd-tinges urine, no clots. Extremities: extremities normal, atraumatic, no cyanosis or edema Pulses: 2+ and symmetric Skin: Skin color, texture, turgor normal. No rashes or lesions Lymph nodes: Cervical, supraclavicular, and axillary nodes normal. Neurologic: Grossly normal Incision/Wound: Left nephrostomy tube / incision site with blood tinged urine in drainage bag as well as some drainage around site. No purlulence / gas / succus.  Lab Results:   Recent Labs  01/04/13 1627 01/05/13 0350  HGB 13.2 11.7*  HCT 39.6 34.8*   BMET  Recent Labs  01/04/13 1627 01/05/13 0350  NA 137 139  K 5.4* 4.7  CL 101 104  CO2 27 26  GLUCOSE 208* 163*  BUN 12 13  CREATININE 1.04 0.92  CALCIUM 8.7 8.5   PT/INR No results found for this basename: LABPROT, INR,  in the last 72 hours ABG No results found for this basename: PHART, PCO2, PO2, HCO3,  in the last 72 hours  Studies/Results: Dg Abd 1 View  01/04/2013   CLINICAL DATA:  Percutaneous nephrolithotomy.  EXAM: ABDOMEN - 1 VIEW  COMPARISON:  CT 10/19/2012.  FINDINGS: Series of 4 intraoperative fluoroscopic spot films demonstrate a percutaneous nephrostomy on the left. Retrograde catheter is identified on some of the images. The final image demonstrates retrieval of stone with a nephrostomy tube coiled in the region of the left renal pelvis.  IMPRESSION: Left percutaneous nephrostomy and stone retrieval.   Electronically Signed   By: Andreas Newport M.D.   On: 01/04/2013 16:43   Dg Retrograde Pyelogram  01/04/2013   CLINICAL DATA:  Bilateral retrograde ureteral pyelography. History of renal stones.  EXAM:  RETROGRADE PYELOGRAM  COMPARISON:  CT, 10/29/2012  FINDINGS: Images submitted for review show the retrograde injections of both ureters with subsequent passage of wires and placement of ureteral stents.  The left intrarenal  collecting system is dilated.  There are degenerative changes of the visualized spine. No osteoblastic or osteolytic lesions. The soft tissues are unremarkable.  IMPRESSION: Bilateral retrograde ureteral pyelography and bilateral ureteral stent placement.   Electronically Signed   By: Amie Portland   On: 01/04/2013 18:54   Dg C-arm 61-120 Min-no Report  01/04/2013   CLINICAL DATA: kidney stone   C-ARM 61-120 MINUTES  Fluoroscopy was utilized by the requesting physician.  No radiographic  interpretation.     Anti-infectives: Anti-infectives   Start     Dose/Rate Route Frequency Ordered Stop   01/04/13 0600  gentamicin (GARAMYCIN) 610 mg in dextrose 5 % 100 mL IVPB     610 mg 115.3 mL/hr over 60 Minutes Intravenous  Once 01/03/13 1915 01/04/13 1152      Assessment/Plan:  1 - Left > Right Nephrolithaisis - Doing well after first stage surgery. Plan to proceed with second stage tomorrow as scheduled in conjunction with Dr. Magnus Ivan. NPO p MN. Will change pain meds to fentanyl for breakthrough and add Toradol as kidney function and hgb acceptable.   [facility], Brandon Schaefer 01/05/2013

## 2013-01-05 NOTE — Care Management Note (Addendum)
    Page 1 of 2   01/11/2013     1:48:57 PM   CARE MANAGEMENT NOTE 01/11/2013  Patient:  Brandon Schaefer, Brandon Schaefer   Account Number:  [id number]  Date Initiated:  01/05/2013  Documentation initiated by:  Lanier Clam  Subjective/Objective Assessment:   56 Y/O M ADMITTED W/NEPHROLITHIASIS,PAC REMOVAL,NON HEALING ABD SURGICAL WOUND.BJ:YNWGN CA,PARTIAL COLECTOMY     Action/Plan:   FROM HOME.HAS PCP,PHARMACY.   Anticipated DC Date:  01/11/2013   Anticipated DC Plan:  HOME W HOME HEALTH SERVICES      DC Planning Services  CM consult      Choice offered to / List presented to:  C-1 Patient        HH arranged  HH-2 PT      [facility] agency  [facility]   Status of service:  Completed, signed off Medicare Important Message given?   (If response is "NO", the following Medicare IM given date fields will be blank) Date Medicare IM given:   Date Additional Medicare IM given:    Discharge Disposition:  HOME W HOME HEALTH SERVICES  Per UR Regulation:  Reviewed for med. necessity/level of care/duration of stay  If discussed at Long Length of Stay Meetings, dates discussed:    Comments:  01/11/13 Brandon Schaefer Brandon Schaefer REP NOTIFIED OF D/C,ALREADY HAD HHPT Schaefer02 SATS ABOVE 88%ON RA.PATIENT INDEP W/ABD INCISION SchaeferNO FURTHER D/C NEEDS OR ORDERS.  01/08/13 Brandon Salberg RN,BSN NCM [phone] PT-HH.[facility] CHOSEN FOR HH.Brandon Schaefer [facility] REP AWARE OF HHPT ORDER.NOTED ON 02,IF QUALIFIES CAN ARRANGE HOME 02.PER NSG PAITENT/CAREGIVER ABLE TO DO WOUND CARE.  01/05/13 Brandon Penick RN,BSN NCM [phone] POD#1  L PCN. NO ANTICIPATED D/C NEEDS.

## 2013-01-05 NOTE — H&P (Signed)
Brandon Schaefer is an 57 y.o. male.   Chief Complaint: Chronic abdominal wound, Port-A-Cath no longer needed HPI: This is a 57 year old gentleman who had undergone a partial colectomy for colon cancer. Postoperatively he had a Port-A-Cath placed and has finished his IV chemotherapy. 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 He presents today for Port-A-Cath removal as well as excision of a chronic draining area on his midline abdominal wound. He is also being operated on for nephrolithiasis.  Past Medical History  Diagnosis Date  . Hypertension   . Diabetes mellitus   . Allergy   . Hyperlipidemia   . Anemia   . Cancer   . Anxiety   . Shortness of breath     with exertion  . Headache(784.0)   . Neuromuscular disorder     peripheral neuropathy  . History of blood transfusion     Past Surgical History  Procedure Laterality Date  . Fracture surgery       ORIF-left radius has pin  . Pilonidal cyst excision    . Partial colectomy  01/17/2012    Procedure: PARTIAL COLECTOMY;  Surgeon: Shelly Rubenstein, MD;  Location: [facility];  Service: General;;  . Proctoscopy  01/17/2012    Procedure: PROCTOSCOPY;  Surgeon: Shelly Rubenstein, MD;  Location: [facility];  Service: General;;  . Portacath placement  03/19/2012    Procedure: INSERTION PORT-A-CATH;  Surgeon: Shelly Rubenstein, MD;  Location: [facility];  Service: General;  Laterality: N/A;    Family History  Problem Relation Age of Onset  . Diabetes Father   . Cancer Maternal Aunt     colon  . Cancer Maternal Grandmother     colon   Social History:  reports that he has never smoked. He has never used smokeless tobacco. He reports that he does not drink alcohol or use illicit drugs.  Allergies:  Allergies  Allergen Reactions  . Adhesive [Tape]     Medications Prior to Admission  Medication Sig Dispense Refill  . carvedilol (COREG) 6.25 MG  tablet Take 6.25 mg by mouth 2 (two) times daily with a meal.      . metFORMIN (GLUCOPHAGE) 1000 MG tablet Take 1,000 mg by mouth daily with breakfast.       . pioglitazone-metformin (ACTOPLUS MET) 15-850 MG per tablet Take 1 tablet by mouth 2 (two) times daily with a meal.      . ramipril (ALTACE) 10 MG capsule Take 10 mg by mouth daily before breakfast.      . rosuvastatin (CRESTOR) 20 MG tablet Take 20 mg by mouth daily before breakfast.      . sertraline (ZOLOFT) 100 MG tablet Take 150 mg by mouth daily before breakfast.      . TRADJENTA 5 MG TABS tablet Take by mouth daily.         Results for orders placed during the hospital encounter of 01/04/13 (from the past 48 hour(s))  GLUCOSE, CAPILLARY     Status: Abnormal   Collection Time    01/04/13  8:50 AM      Result Value Range   Glucose-Capillary 185 (*) 70 - 99 mg/dL   Comment 1 Notify RN    TYPE AND SCREEN     Status: None   Collection Time    01/04/13 12:00 PM      Result Value Range   ABO/RH(D) O POS  Antibody Screen NEG     Sample Expiration 01/07/2013    GLUCOSE, CAPILLARY     Status: Abnormal   Collection Time    01/04/13  4:19 PM      Result Value Range   Glucose-Capillary 201 (*) 70 - 99 mg/dL   Comment 1 Documented in Chart     Comment 2 Notify RN    HEMOGLOBIN AND HEMATOCRIT, BLOOD     Status: None   Collection Time    01/04/13  4:27 PM      Result Value Range   Hemoglobin 13.2  13.0 - 17.0 g/dL   HCT 16.1  09.6 - 04.5 %  BASIC METABOLIC PANEL     Status: Abnormal   Collection Time    01/04/13  4:27 PM      Result Value Range   Sodium 137  135 - 145 mEq/L   Potassium 5.4 (*) 3.5 - 5.1 mEq/L   Comment: MODERATE HEMOLYSIS     HEMOLYSIS AT THIS LEVEL MAY AFFECT RESULT   Chloride 101  96 - 112 mEq/L   CO2 27  19 - 32 mEq/L   Glucose, Bld 208 (*) 70 - 99 mg/dL   BUN 12  6 - 23 mg/dL   Creatinine, Ser 4.09  0.50 - 1.35 mg/dL   Calcium 8.7  8.4 - 81.1 mg/dL   GFR calc non Af Amer 77 (*) >90 mL/min   GFR  calc Af Amer 90 (*) >90 mL/min   Comment: (NOTE)     The eGFR has been calculated using the CKD EPI equation.     This calculation has not been validated in all clinical situations.     eGFR's persistently <90 mL/min signify possible Chronic Kidney     Disease.  GLUCOSE, CAPILLARY     Status: Abnormal   Collection Time    01/04/13  6:53 PM      Result Value Range   Glucose-Capillary 154 (*) 70 - 99 mg/dL  GLUCOSE, CAPILLARY     Status: Abnormal   Collection Time    01/04/13 10:08 PM      Result Value Range   Glucose-Capillary 177 (*) 70 - 99 mg/dL  BASIC METABOLIC PANEL     Status: Abnormal   Collection Time    01/05/13  3:50 AM      Result Value Range   Sodium 139  135 - 145 mEq/L   Potassium 4.7  3.5 - 5.1 mEq/L   Chloride 104  96 - 112 mEq/L   CO2 26  19 - 32 mEq/L   Glucose, Bld 163 (*) 70 - 99 mg/dL   BUN 13  6 - 23 mg/dL   Creatinine, Ser 9.14  0.50 - 1.35 mg/dL   Calcium 8.5  8.4 - 78.2 mg/dL   GFR calc non Af Amer >90  >90 mL/min   GFR calc Af Amer >90  >90 mL/min   Comment: (NOTE)     The eGFR has been calculated using the CKD EPI equation.     This calculation has not been validated in all clinical situations.     eGFR's persistently <90 mL/min signify possible Chronic Kidney     Disease.  HEMOGLOBIN AND HEMATOCRIT, BLOOD     Status: Abnormal   Collection Time    01/05/13  3:50 AM      Result Value Range   Hemoglobin 11.7 (*) 13.0 - 17.0 g/dL   HCT 95.6 (*) 21.3 - 08.6 %  GLUCOSE,  CAPILLARY     Status: Abnormal   Collection Time    01/05/13  7:11 AM      Result Value Range   Glucose-Capillary 180 (*) 70 - 99 mg/dL   Dg Abd 1 View  1/61/0960   CLINICAL DATA:  Percutaneous nephrolithotomy.  EXAM: ABDOMEN - 1 VIEW  COMPARISON:  CT 10/19/2012.  FINDINGS: Series of 4 intraoperative fluoroscopic spot films demonstrate a percutaneous nephrostomy on the left. Retrograde catheter is identified on some of the images. The final image demonstrates retrieval of stone  with a nephrostomy tube coiled in the region of the left renal pelvis.  IMPRESSION: Left percutaneous nephrostomy and stone retrieval.   Electronically Signed   By: Andreas Newport M.D.   On: 01/04/2013 16:43   Dg Retrograde Pyelogram  01/04/2013   CLINICAL DATA:  Bilateral retrograde ureteral pyelography. History of renal stones.  EXAM: RETROGRADE PYELOGRAM  COMPARISON:  CT, 10/29/2012  FINDINGS: Images submitted for review show the retrograde injections of both ureters with subsequent passage of wires and placement of ureteral stents.  The left intrarenal collecting system is dilated.  There are degenerative changes of the visualized spine. No osteoblastic or osteolytic lesions. The soft tissues are unremarkable.  IMPRESSION: Bilateral retrograde ureteral pyelography and bilateral ureteral stent placement.   Electronically Signed   By: Amie Portland   On: 01/04/2013 18:54   Dg C-arm 61-120 Min-no Report  01/04/2013   CLINICAL DATA: kidney stone   C-ARM 61-120 MINUTES  Fluoroscopy was utilized by the requesting physician.  No radiographic  interpretation.     Review of Systems  All other systems reviewed and are negative.    There were no vitals taken for this visit. Physical Exam  Constitutional: He is oriented to person, place, and time. No distress.  Morbidly obese  HENT:  Head: Normocephalic and atraumatic.  Eyes: Conjunctivae are normal. Pupils are equal, round, and reactive to light.  Neck: Normal range of motion. Neck supple.  Cardiovascular: Normal rate, regular rhythm and normal heart sounds.   Respiratory: Effort normal and breath sounds normal.  GI: Soft. Bowel sounds are normal.  Chronic abdominal scar with open wound and drainage.  Musculoskeletal: Normal range of motion.  Neurological: He is alert and oriented to person, place, and time.  Skin: Skin is warm and dry.  Psychiatric: His behavior is normal.     Assessment/Plan Nonhealing surgical wound Port-A-Cath no  longer needed  I will proceed with excision of the nonhealing surgical wound. I will also proceed with Port-A-Cath removal. I discussed the risks which includes but is not limited to bleeding, ongoing infection, having another chronic open abdominal wound, need for further surgery, et Karie Soda. I also discussed postoperative cardiopulmonary issues and DVT. He understands and wishes to proceed. Surgery will be scheduled  Seddrick Flax A 01/05/2013, 10:22 AM

## 2013-01-05 NOTE — Op Note (Signed)
NAME:  Brandon Schaefer, Brandon Schaefer NO.:  [id number]  MEDICAL RECORD NO.:  [id number]  LOCATION:  1403                         FACILITY:  [facility]  PHYSICIAN:  Sebastian Ache, MD     DATE OF BIRTH:  [birth year]  DATE OF PROCEDURE: 01/04/2013 DATE OF DISCHARGE:                              OPERATIVE REPORT   PREOPERATIVE DIAGNOSES:  Left large volume kidney stone, left ureteral stone, right renal stone.  PROCEDURE: 1. Cystoscopy, bilateral retrograde pyelogram interpretation,     insertion of bilateral ureteral stents, left ureteroscopy, Holmium     laser lithotripsy. 2. Left first stage percutaneous nephrolithotomy stone greater than 2     cm. 3. Left percutaneous access of the kidney. 4. Dilation of left percutaneous tract. 5. Left antegrade nephrostogram interpretation. 6. Insertion of left nephrostomy tube.  ESTIMATED BLOOD LOSS:  100 mL.  FINDINGS: 1. Impacted left proximal ureteral stone with severe hydronephrosis. 2. Large lower pole, very dense renal stone. 3. Unremarkable right retrograde pyelogram. 4. Unremarkable urinary bladder.  DRAINS: 1. Left nephrostomy tube, straight drain. 2. Left nephroureteral stent cap. 3. Foley catheter straight drain.  INDICATION:  Mr. Friberg is a pleasant morbidly obese, 57 year old gentleman with history of colon cancer, status post resection and chemoradiation.  He has a chronic cutaneous abdominal wound as well as a Port-A-Cath in situ related to this.  He was found on cancer surveillance imaging to have severe left hydronephrosis and multifocal stone disease including left ureteral stone, large volume left renal stone, and small volume right renal stone.  Options were discussed for management including various approaches including ureteroscopic and percutaneous in combination approaches with goal of dealing left ureteral stone versus goal of stone free.  The patient wished to proceed aggressively with goal stone free using  combination of percutaneous ureteroscopic approach in a staged fashion.  An informed consent was obtained and placed in the medical record.  PROCEDURE IN DETAIL:  The patient being Brandon Schaefer was verified. Procedure being left first stage percutaneous nephrolithotomy, cysto bilateral stents and ureteroscopic stone manipulation were confirmed. Procedure was carried out.  Time-out was performed.  Intravenous antibiotics were administered.  General endotracheal anesthesia was introduced.  The patient was placed into a low lithotomy position. Sterile field was created by prepping and draping the patient's penis, perineum, and proximal thighs using iodine x3.  Next, cystourethroscopy was performed using 22-French rigid cystoscope with 12-degree offset lens.  Inspection of the anterior and posterior urethra unremarkable. Inspection of the bladder revealed no diverticula, calcifications, papular lesions.  The right ureteral orifice was gently cannulated with a 6-French end-hole catheter and right retrograde pyelogram was obtained.  Right retrograde pyelogram demonstrates single right ureter, single system, right kidney.  No filling defects or narrowing noted.  A 0.038 Glidewire was advanced at the level of the lower pole, which a new 6 x 26 double-J stent was placed, and this will allow for passive dilation and later ureteroscopy to be performed in a safer fashion.  Next, the left ureteral orifice was cannulated with a 6-French end-hole catheter and left retrograde pyelogram was obtained.  Left retrograde pyelogram demonstrated a single left ureter, single system left kidney.  There  was a filling defect in the proximal third of the ureter with massive hydronephrosis above this consistent with known stone.  There was also a large radio-density overlying the lower pole consistent with known stone.  Attempt was made to pass the 0.038 Glidewire, however just coiled beneath the impacting  stone.  The Sensor wire was however able to navigate past this but the end-hole catheter could not be advanced, this signifies likely impaction of the ureteral stone that likely Holmium laser lithotripsy would be necessary at this point, and as such, the 24-cm 12/14 ureteral access sheath was carefully placed using fluoroscopic guidance at the level of the distal ureter. An 8-French flexible digital ureteroscope was used to perform ureteroscopy of the left distal ureter.  This revealed no mucosal abnormalities.  There was a calcification in the mid ureter,with  significant narrowing proximal to this without frank stricture.  This was too narrow to allow passage of the 8-French scope that was exchanged for the 6-French nondigital ureteroscope, which then easily navigated to the area of the stone in question, it was indeed impacted.  Holmium laser energy was applied to the stone using settings of 0.6 joules and 6 Hz and 200 nanometer fiber fragmenting the stone  into fragments approximately 2 mm or less in diameter.  This then allowed passage of the 6-French end-hole catheter at the level of the renal pelvis.  These fragments were left in situ for later retrieval.  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  The patient was then placed into a prone position applying chest rolls, axillary rolls, sequential pressure devices, and a prone view apparatus on his face.  A new sterile field was created prepping and draping the patient's entire left flank, as well as the previously placed extension tubing and nephroureteral stent.  Using simultaneous fluoroscopy, and retrograde pyelography, a suitable calyx was found in the mid kidney as this was felt likely be able to provide access to the large kidney stone as well as access to the proximal ureter for retrieval of the ureteral stones. Using bull's eye  technique, an 18-Guage Chiba needle was then used to cannulate the calyx, as verified efflux of vigorous clear urine was seen signifying placement into the collecting system.  A 0.038 Glidewire was then advanced and coiled in the renal pelvis and the needle was exchanged for a KMP catheter with angled tip, which then allowed angulation and passage of the Glidewire down the ureter, which was then exchanged for a super stiff wire via the KMP catheter.  Next, the dual lumen introducer was carefully passed at the level of the proximal ureter and a second Glidewire was advanced, and exchanged for a second super stiff wire via the KMP catheter .  Percutaneous drape was then applied.  One super stiff wire and set aside as a safety wire.  The NephroMax balloon dilation apparatus with extra long sheath was then carefully positioned across the working wire and the midpole calyx then inflated to a pressure of 16 atmospheres, held for 90 seconds.  The sheath was advanced across this.  Rigid nephroscopy was then performed. This revealed excellent placement of the sheath and no excessive bleeding.  As expected, the left renal pelvis was quite redundant. During these maneuvers, the stone in question which appeared to be in the renal pelvis moved to a lower pole calyx just adjacent to the access tract; therefore, it was not accessible via rigid  technique.  Therefore, a  flexible cystoscope was used to visualize the stone.  Holmium laser energy was applied using 365nm fiber at settings of 0.6 joules and 20 Hz fragmenting the stone into fragments approximately 8 mm or less.  An escape type basket was then used to sequentially grasp fragments and remove them out in entirety, resulted in complete ablation of all fragments larger than 1/3 mm acceptable by this access tract, and flexible technique today.  The patient had been in prone position for over 2 hours at this point, and felt that conclusion of this  first stage would be warranted.  The Glidewire was advanced down the ureter over which the KMP catheter was carefully placed, acting as nephroureteral stent.  This was capped.  Following a new 16-French nephrostomy tube was placed over the working wire and deployed in the renal pelvis.  Final antegrade nephrostogram revealed excellent placement.  No excess contrast extravasation.  Safety wire was removed.  Nephrostomy tube was made to straight drain.  Percutaneous dressing was applied.  All tubes were sutured in place using interrupted silk.  Procedure was then terminated.  The patient tolerated the procedure well with no immediate periprocedural complications.  The patient was taken to postanesthesia care in stable condition.          ______________________________ Sebastian Ache, MD     TM/MEDQ  D:  01/04/2013  T:  01/05/2013  Job:  [id number]

## 2013-01-06 ENCOUNTER — Inpatient Hospital Stay (HOSPITAL_COMMUNITY): Admission: RE | Admit: 2013-01-06 | Payer: 59 | Source: Ambulatory Visit | Admitting: Urology

## 2013-01-06 ENCOUNTER — Encounter (HOSPITAL_COMMUNITY): Payer: Self-pay | Admitting: Urology

## 2013-01-06 ENCOUNTER — Inpatient Hospital Stay (HOSPITAL_COMMUNITY): Payer: 59

## 2013-01-06 ENCOUNTER — Encounter (HOSPITAL_COMMUNITY)
Admission: RE | Disposition: A | Discharge status: Home Health | Payer: Self-pay | Source: Ambulatory Visit | Attending: Urology

## 2013-01-06 ENCOUNTER — Inpatient Hospital Stay (HOSPITAL_COMMUNITY): Payer: 59 | Admitting: Anesthesiology

## 2013-01-06 ENCOUNTER — Encounter (HOSPITAL_COMMUNITY): Payer: Self-pay | Admitting: Anesthesiology

## 2013-01-06 DIAGNOSIS — T8189XA Other complications of procedures, not elsewhere classified, initial encounter: Secondary | ICD-10-CM

## 2013-01-06 DIAGNOSIS — Z452 Encounter for adjustment and management of vascular access device: Secondary | ICD-10-CM

## 2013-01-06 HISTORY — PX: NEPHROLITHOTOMY: SHX5134

## 2013-01-06 HISTORY — PX: PORT-A-CATH REMOVAL: SHX5289

## 2013-01-06 HISTORY — PX: CYSTOSCOPY WITH RETROGRADE PYELOGRAM, URETEROSCOPY AND STENT PLACEMENT: SHX5789

## 2013-01-06 HISTORY — PX: COMPLEX WOUND CLOSURE: SHX6446

## 2013-01-06 LAB — GLUCOSE, CAPILLARY
Glucose-Capillary: 175 mg/dL — ABNORMAL HIGH (ref 70–99)
Glucose-Capillary: 171 mg/dL — ABNORMAL HIGH (ref 70–99)
Glucose-Capillary: 145 mg/dL — ABNORMAL HIGH (ref 70–99)
Glucose-Capillary: 127 mg/dL — ABNORMAL HIGH (ref 70–99)

## 2013-01-06 SURGERY — NEPHROLITHOTOMY PERCUTANEOUS SECOND LOOK
Anesthesia: General | Laterality: Right | Wound class: Clean Contaminated

## 2013-01-06 MED ORDER — SODIUM CHLORIDE 0.9 % IR SOLN
Status: DC | PRN
  Administered 2013-01-06: 5000 mL

## 2013-01-06 MED ORDER — ONDANSETRON HCL 4 MG/2ML IJ SOLN
INTRAMUSCULAR | Status: DC | PRN
  Administered 2013-01-06: 4 mg via INTRAVENOUS

## 2013-01-06 MED ORDER — GLYCOPYRROLATE 0.2 MG/ML IJ SOLN
INTRAMUSCULAR | Status: DC | PRN
  Administered 2013-01-06: .9 mg via INTRAVENOUS

## 2013-01-06 MED ORDER — LACTATED RINGERS IV SOLN
INTRAVENOUS | Status: DC | PRN
  Administered 2013-01-06 (×2): via INTRAVENOUS

## 2013-01-06 MED ORDER — GENTAMICIN SULFATE 40 MG/ML IJ SOLN
620.0000 mg | INTRAVENOUS | Status: DC
  Administered 2013-01-06: 620 mg via INTRAVENOUS
  Filled 2013-01-06: qty 15.5

## 2013-01-06 MED ORDER — FENTANYL CITRATE 0.05 MG/ML IJ SOLN
INTRAMUSCULAR | Status: DC | PRN
  Administered 2013-01-06: 150 ug via INTRAVENOUS
  Administered 2013-01-06 (×4): 25 ug via INTRAVENOUS
  Administered 2013-01-06 (×3): 50 ug via INTRAVENOUS

## 2013-01-06 MED ORDER — BUPIVACAINE HCL (PF) 0.5 % IJ SOLN
INTRAMUSCULAR | Status: DC | PRN
  Administered 2013-01-06: 14 mL

## 2013-01-06 MED ORDER — SUCCINYLCHOLINE CHLORIDE 20 MG/ML IJ SOLN
INTRAMUSCULAR | Status: DC | PRN
  Administered 2013-01-06: 200 mg via INTRAVENOUS

## 2013-01-06 MED ORDER — PHENYLEPHRINE HCL 10 MG/ML IJ SOLN
INTRAMUSCULAR | Status: DC | PRN
  Administered 2013-01-06 (×2): 40 ug via INTRAVENOUS

## 2013-01-06 MED ORDER — HYDROMORPHONE HCL PF 1 MG/ML IJ SOLN
INTRAMUSCULAR | Status: AC
  Filled 2013-01-06: qty 1

## 2013-01-06 MED ORDER — PROPOFOL 10 MG/ML IV BOLUS
INTRAVENOUS | Status: DC | PRN
  Administered 2013-01-06: 300 mg via INTRAVENOUS

## 2013-01-06 MED ORDER — LACTATED RINGERS IV SOLN: INTRAVENOUS | Status: DC

## 2013-01-06 MED ORDER — ROCURONIUM BROMIDE 100 MG/10ML IV SOLN
INTRAVENOUS | Status: DC | PRN
  Administered 2013-01-06: 20 mg via INTRAVENOUS
  Administered 2013-01-06 (×4): 10 mg via INTRAVENOUS
  Administered 2013-01-06: 20 mg via INTRAVENOUS
  Administered 2013-01-06: 50 mg via INTRAVENOUS

## 2013-01-06 MED ORDER — BUPIVACAINE HCL (PF) 0.5 % IJ SOLN
INTRAMUSCULAR | Status: AC
  Filled 2013-01-06: qty 30

## 2013-01-06 MED ORDER — PROMETHAZINE HCL 25 MG/ML IJ SOLN: 6.2500 mg | INTRAMUSCULAR | Status: DC | PRN

## 2013-01-06 MED ORDER — IOHEXOL 300 MG/ML  SOLN
INTRAMUSCULAR | Status: DC | PRN
  Administered 2013-01-06: 29 mL

## 2013-01-06 MED ORDER — ACETAMINOPHEN 10 MG/ML IV SOLN
1000.0000 mg | Freq: Once | INTRAVENOUS | Status: DC
  Filled 2013-01-06: qty 100

## 2013-01-06 MED ORDER — PHENYLEPHRINE HCL 10 MG/ML IJ SOLN
10.0000 mg | INTRAVENOUS | Status: DC | PRN
  Administered 2013-01-06: 20 ug/min via INTRAVENOUS

## 2013-01-06 MED ORDER — ACETAMINOPHEN 10 MG/ML IV SOLN
INTRAVENOUS | Status: DC | PRN
  Administered 2013-01-06: 1000 mg via INTRAVENOUS

## 2013-01-06 MED ORDER — HYDROMORPHONE HCL PF 1 MG/ML IJ SOLN
0.2500 mg | INTRAMUSCULAR | Status: DC | PRN
  Administered 2013-01-06 (×2): 0.5 mg via INTRAVENOUS

## 2013-01-06 MED ORDER — NEOSTIGMINE METHYLSULFATE 1 MG/ML IJ SOLN
INTRAMUSCULAR | Status: DC | PRN
  Administered 2013-01-06: 5 mg via INTRAVENOUS

## 2013-01-06 SURGICAL SUPPLY — 91 items
BAG URINE DRAINAGE (UROLOGICAL SUPPLIES) ×4 IMPLANT
BANDAGE ELASTIC 6 VELCRO ST LF (GAUZE/BANDAGES/DRESSINGS) IMPLANT
BASKET LASER NITINOL 1.9FR (BASKET) ×4 IMPLANT
BASKET STNLS GEMINI 4WIRE 3FR (BASKET) IMPLANT
BASKET ZERO TIP NITINOL 2.4FR (BASKET) IMPLANT
BENZOIN TINCTURE PRP APPL 2/3 (GAUZE/BANDAGES/DRESSINGS) ×8 IMPLANT
BLADE SURG 15 STRL LF DISP TIS (BLADE) ×3 IMPLANT
BLADE SURG 15 STRL SS (BLADE) ×1
CANISTER SUCTION 2500CC (MISCELLANEOUS) IMPLANT
CATH BEACON 5.038 65CM KMP-01 (CATHETERS) ×4 IMPLANT
CATH FOLEY 2W COUNCIL 20FR 5CC (CATHETERS) ×4 IMPLANT
CATH INTERMIT  6FR 70CM (CATHETERS) ×4 IMPLANT
CATH ROBINSON RED A/P 20FR (CATHETERS) IMPLANT
CATH X-FORCE N30 NEPHROSTOMY (TUBING) IMPLANT
CLEANER TIP ELECTROSURG 2X2 (MISCELLANEOUS) IMPLANT
CLOSURE STERI-STRIP 1/4X4 (GAUZE/BANDAGES/DRESSINGS) ×4 IMPLANT
CLOTH BEACON ORANGE TIMEOUT ST (SAFETY) ×8 IMPLANT
COVER SURGICAL LIGHT HANDLE (MISCELLANEOUS) ×8 IMPLANT
DECANTER SPIKE VIAL GLASS SM (MISCELLANEOUS) IMPLANT
DERMABOND ADVANCED (GAUZE/BANDAGES/DRESSINGS) ×1
DERMABOND ADVANCED .7 DNX12 (GAUZE/BANDAGES/DRESSINGS) ×3 IMPLANT
DRAPE C-ARM 42X120 X-RAY (DRAPES) ×4 IMPLANT
DRAPE CAMERA CLOSED 9X96 (DRAPES) ×4 IMPLANT
DRAPE LAPAROTOMY TRNSV 102X78 (DRAPE) ×4 IMPLANT
DRAPE LINGEMAN PERC (DRAPES) ×4 IMPLANT
DRAPE SURG IRRIG POUCH 19X23 (DRAPES) ×4 IMPLANT
DRSG TEGADERM 4X4.75 (GAUZE/BANDAGES/DRESSINGS) ×4 IMPLANT
DRSG TEGADERM 8X12 (GAUZE/BANDAGES/DRESSINGS) ×4 IMPLANT
DRSG TELFA PLUS 4X6 ADH ISLAND (GAUZE/BANDAGES/DRESSINGS) ×4 IMPLANT
ELECT COATED BLADE 2.86 ST (ELECTRODE) IMPLANT
ELECT REM PT RETURN 9FT ADLT (ELECTROSURGICAL) ×4
ELECTRODE REM PT RTRN 9FT ADLT (ELECTROSURGICAL) ×3 IMPLANT
FIBER LASER FLEXIVA 200 (UROLOGICAL SUPPLIES) IMPLANT
FIBER LASER FLEXIVA 365 (UROLOGICAL SUPPLIES) IMPLANT
FIBER LASER FLEXIVA 550 (UROLOGICAL SUPPLIES) IMPLANT
GAUZE PACKING 1/2 X5 YD (GAUZE/BANDAGES/DRESSINGS) ×4 IMPLANT
GAUZE PACKING IODOFORM 1/4X5 (PACKING) ×4 IMPLANT
GAUZE SPONGE 4X4 16PLY XRAY LF (GAUZE/BANDAGES/DRESSINGS) IMPLANT
GLOVE BIO SURGEON STRL SZ7 (GLOVE) IMPLANT
GLOVE BIOGEL M STRL SZ7.5 (GLOVE) ×4 IMPLANT
GLOVE BIOGEL PI IND STRL 7.0 (GLOVE) IMPLANT
GLOVE BIOGEL PI INDICATOR 7.0 (GLOVE)
GLOVE SURG SIGNA 7.5 PF LTX (GLOVE) ×4 IMPLANT
GLOVE SURG SS PI 8.0 STRL IVOR (GLOVE) IMPLANT
GOWN PREVENTION PLUS LG XLONG (DISPOSABLE) ×8 IMPLANT
GOWN STRL REIN XL XLG (GOWN DISPOSABLE) ×8 IMPLANT
GUIDEWIRE ANG ZIPWIRE 038X150 (WIRE) ×8 IMPLANT
GUIDEWIRE STR DUAL SENSOR (WIRE) ×4 IMPLANT
IV NS IRRIG 3000ML ARTHROMATIC (IV SOLUTION) ×4 IMPLANT
KIT BASIN OR (CUSTOM PROCEDURE TRAY) ×4 IMPLANT
LASER FIBER DISP 1000U (UROLOGICAL SUPPLIES) IMPLANT
MANIFOLD NEPTUNE II (INSTRUMENTS) ×4 IMPLANT
MARKER SKIN DUAL TIP RULER LAB (MISCELLANEOUS) IMPLANT
NEEDLE HYPO 22GX1.5 SAFETY (NEEDLE) ×4 IMPLANT
NEEDLE HYPO 25X1 1.5 SAFETY (NEEDLE) IMPLANT
NS IRRIG 1000ML POUR BTL (IV SOLUTION) ×8 IMPLANT
PACK BASIC VI WITH GOWN DISP (CUSTOM PROCEDURE TRAY) ×4 IMPLANT
PACK CYSTO (CUSTOM PROCEDURE TRAY) ×4 IMPLANT
PAD ABD 7.5X8 STRL (GAUZE/BANDAGES/DRESSINGS) ×4 IMPLANT
PENCIL BUTTON HOLSTER BLD 10FT (ELECTRODE) ×4 IMPLANT
PROBE LITHOCLAST ULTRA 3.8X403 (UROLOGICAL SUPPLIES) IMPLANT
PROBE PNEUMATIC 1.0MMX570MM (UROLOGICAL SUPPLIES) IMPLANT
SET IRRIG Y TYPE TUR BLADDER L (SET/KITS/TRAYS/PACK) IMPLANT
SET WARMING FLUID IRRIGATION (MISCELLANEOUS) IMPLANT
SHEATH ACCESS URETERAL 24CM (SHEATH) ×4 IMPLANT
SHEATH ACCESS URETERAL 38CM (SHEATH) ×4 IMPLANT
SOL PREP POV-IOD 16OZ 10% (MISCELLANEOUS) IMPLANT
SPONGE GAUZE 4X4 12PLY (GAUZE/BANDAGES/DRESSINGS) ×4 IMPLANT
SPONGE LAP 4X18 X RAY DECT (DISPOSABLE) ×8 IMPLANT
STENT CONTOUR 6FRX26X.038 (STENTS) ×4 IMPLANT
STONE CATCHER W/TUBE ADAPTER (UROLOGICAL SUPPLIES) IMPLANT
STRIP CLOSURE SKIN 1/2X4 (GAUZE/BANDAGES/DRESSINGS) ×4 IMPLANT
SUT ETHILON 2 0 PS N (SUTURE) ×8 IMPLANT
SUT SILK 2 0 30  PSL (SUTURE) ×1
SUT SILK 2 0 30 PSL (SUTURE) ×3 IMPLANT
SUT SILK 3 0 SH 30 (SUTURE) ×4 IMPLANT
SUT VIC AB 3-0 SH 27 (SUTURE) ×1
SUT VIC AB 3-0 SH 27XBRD (SUTURE) ×3 IMPLANT
SYR 20CC LL (SYRINGE) ×8 IMPLANT
SYR BULB IRRIGATION 50ML (SYRINGE) IMPLANT
SYR CONTROL 10ML LL (SYRINGE) ×4 IMPLANT
SYRINGE 10CC LL (SYRINGE) ×4 IMPLANT
SYRINGE IRR TOOMEY STRL 70CC (SYRINGE) IMPLANT
TAPE CLOTH SURG 4X10 WHT LF (GAUZE/BANDAGES/DRESSINGS) ×4 IMPLANT
TOWEL OR 17X26 10 PK STRL BLUE (TOWEL DISPOSABLE) ×8 IMPLANT
TOWEL OR NON WOVEN STRL DISP B (DISPOSABLE) ×8 IMPLANT
TRAY FOLEY CATH 14FRSI W/METER (CATHETERS) IMPLANT
TRAY FOLEY CATH 16FRSI W/METER (SET/KITS/TRAYS/PACK) ×4 IMPLANT
TUBE FEEDING 8FR 16IN STR KANG (MISCELLANEOUS) ×4 IMPLANT
TUBING CONNECTING 10 (TUBING) ×12 IMPLANT
YANKAUER SUCT BULB TIP 10FT TU (MISCELLANEOUS) ×4 IMPLANT

## 2013-01-06 NOTE — Anesthesia Postprocedure Evaluation (Signed)
Anesthesia Post Note  Patient: Brandon Schaefer  Procedure(s) Performed: Procedure(s) (LRB): NEPHROLITHOTOMY PERCUTANEOUS SECOND LOOK (Left) CYSTOSCOPY WITH RIGHT RETROGRADE PYELOGRAM, URETEROSCOPY AND BILATERAL STENT EXCHANGE, BILATERAL STONE BASKET EXTRACTION  (Right) REMOVAL PORT-A-CATH (Left) EXCISION CHRONIC ABDOMINAL WOUND (N/A)  Anesthesia type: General  Patient location: PACU  Post pain: Pain level controlled  Post assessment: Post-op Vital signs reviewed  Last Vitals:  Filed Vitals:   01/06/13 1415  BP: 175/73  Pulse: 95  Temp: 36.7 C  Resp: 21    Post vital signs: Reviewed  Level of consciousness: sedated  Complications: No apparent anesthesia complications

## 2013-01-06 NOTE — Transfer of Care (Signed)
Immediate Anesthesia Transfer of Care Note  Patient: Brandon Schaefer  Procedure(s) Performed: Procedure(s) (LRB): NEPHROLITHOTOMY PERCUTANEOUS SECOND LOOK (Left) CYSTOSCOPY WITH RIGHT RETROGRADE PYELOGRAM, URETEROSCOPY AND BILATERAL STENT EXCHANGE, BILATERAL STONE BASKET EXTRACTION  (Right) REMOVAL PORT-A-CATH (Left) EXCISION CHRONIC ABDOMINAL WOUND (N/A)  Patient Location: PACU  Anesthesia Type: General  Level of Consciousness: sedated, patient cooperative and responds to stimulation  Airway & Oxygen Therapy: Patient Spontanous Breathing and Patient connected to face mask oxgen  Post-op Assessment: Report given to PACU RN and Post -op Vital signs reviewed and stable  Post vital signs: Reviewed and stable  Complications: No apparent anesthesia complications

## 2013-01-06 NOTE — Op Note (Signed)
   Brandon Schaefer 01/04/2013 - 01/06/2013   Pre-op Diagnosis: [name] no longer needed; chronic non healing abdominal wound     Post-op Diagnosis: same  Procedure(s):  REMOVAL [name] EXCISION CHRONIC ABDOMINAL WOUND  Surgeon(s): Sebastian Ache, MD Shelly Rubenstein, MD  Anesthesia: General  Staff:  Circulator: Apolonio Schneiders, RN; Gabriel Rung, RN Scrub Person: [facility], [place]  Estimated Blood Loss: Minimal                         Brandon Schaefer   Date: 01/06/2013  Time: 9:37 AM

## 2013-01-06 NOTE — Progress Notes (Signed)
Day of Surgery  Subjective:  1 - Left > Right Nephrolithaisis - Pt with Left 2cm renal + 6mm lower pole + 9mm prox ureteral stone wtih moderate hydro as well as right punctate stones incidetnal on CT for abdominal wound / colon cancer surveillance. Stone 1000HU density wtih skin-stone distance 18cm. Now s/p first stage left percutaneous nephrostolithotomy on 9/22 with plan for second stage procedure today to address residual left sided as well as rt sided stones in conjuction with Dr. Magnus Ivan who will be revising abd wound and removing port-a-cath.  Today Davari is feeling somewhat improved in terms of nausea. No interval fevers.  Objective: Vital signs in last 24 hours: Temp:  [97.9 F (36.6 C)-98.1 F (36.7 C)] 97.9 F (36.6 C) (09/24 0539) Pulse Rate:  [90-92] 91 (09/24 0539) Resp:  [16-20] 16 (09/24 0539) BP: (127-147)/(58-68) 143/67 mmHg (09/24 0539) SpO2:  [97 %-98 %] 97 % (09/24 0539) Last BM Date: 01/04/13  Intake/Output from previous day: 09/23 0701 - 09/24 0700 In: 2017.5 [P.O.:180; I.V.:1837.5] Out: 2425 [Urine:2425] Intake/Output this shift:    General appearance: alert, cooperative, appears stated age and morbidly obese Head: Normocephalic, without obvious abnormality, atraumatic Eyes: conjunctivae/corneas clear. PERRL, EOM's intact. Fundi benign. Ears: normal TM's and external ear canals both ears Nose: Nares normal. Septum midline. Mucosa normal. No drainage or sinus tenderness. Throat: lips, mucosa, and tongue normal; teeth and gums normal Neck: no adenopathy, no carotid bruit, no JVD, supple, symmetrical, trachea midline and thyroid not enlarged, symmetric, no tenderness/mass/nodules Back: symmetric, no curvature. ROM normal. No CVA tenderness. Resp: clear to auscultation bilaterally Chest wall: no tenderness Cardio: regular rate and rhythm, S1, S2 normal, no murmur, click, rub or gallop GI: soft, non-tender; bowel sounds normal; no masses,  no organomegaly and  dry dressing over chronic subcutaneous abd wound Male genitalia: normal, foley in place with blood-tinged urine, no clots Extremities: extremities normal, atraumatic, no cyanosis or edema Pulses: 2+ and symmetric Skin: Skin color, texture, turgor normal. No rashes or lesions Lymph nodes: Cervical, supraclavicular, and axillary nodes normal. Neurologic: Grossly normal Incision/Wound: Left nephrostomy tube with blood tinged urine output w/o lcots. Some leakage around tube that is simple.   Lab Results:   Recent Labs  01/04/13 1627 01/05/13 0350  HGB 13.2 11.7*  HCT 39.6 34.8*   BMET  Recent Labs  01/04/13 1627 01/05/13 0350  NA 137 139  K 5.4* 4.7  CL 101 104  CO2 27 26  GLUCOSE 208* 163*  BUN 12 13  CREATININE 1.04 0.92  CALCIUM 8.7 8.5   PT/INR No results found for this basename: LABPROT, INR,  in the last 72 hours ABG No results found for this basename: PHART, PCO2, PO2, HCO3,  in the last 72 hours  Studies/Results: Dg Abd 1 View  01/04/2013   CLINICAL DATA:  Percutaneous nephrolithotomy.  EXAM: ABDOMEN - 1 VIEW  COMPARISON:  CT 10/19/2012.  FINDINGS: Series of 4 intraoperative fluoroscopic spot films demonstrate a percutaneous nephrostomy on the left. Retrograde catheter is identified on some of the images. The final image demonstrates retrieval of stone with a nephrostomy tube coiled in the region of the left renal pelvis.  IMPRESSION: Left percutaneous nephrostomy and stone retrieval.   Electronically Signed   By: Andreas Newport M.D.   On: 01/04/2013 16:43   Dg Retrograde Pyelogram  01/04/2013   CLINICAL DATA:  Bilateral retrograde ureteral pyelography. History of renal stones.  EXAM: RETROGRADE PYELOGRAM  COMPARISON:  CT, 10/29/2012  FINDINGS: Images submitted  for review show the retrograde injections of both ureters with subsequent passage of wires and placement of ureteral stents.  The left intrarenal collecting system is dilated.  There are degenerative changes of  the visualized spine. No osteoblastic or osteolytic lesions. The soft tissues are unremarkable.  IMPRESSION: Bilateral retrograde ureteral pyelography and bilateral ureteral stent placement.   Electronically Signed   By: Amie Portland   On: 01/04/2013 18:54   Dg C-arm 61-120 Min-no Report  01/04/2013   CLINICAL DATA: kidney stone   C-ARM 61-120 MINUTES  Fluoroscopy was utilized by the requesting physician.  No radiographic  interpretation.     Anti-infectives: Anti-infectives   Start     Dose/Rate Route Frequency Ordered Stop   01/04/13 0600  gentamicin (GARAMYCIN) 610 mg in dextrose 5 % 100 mL IVPB     610 mg 115.3 mL/hr over 60 Minutes Intravenous  Once 01/03/13 1915 01/04/13 1152      Assessment/Plan:  1 - Left > Right Nephrolithaisis - - Proceed as planned today with bilateral second stage procedure in conjunction with Dr. Magnus Ivan for abd wound revision and port-a-cath removal.    LOS: 2 days    [facility], Onalee Steinbach 01/06/2013

## 2013-01-06 NOTE — Brief Op Note (Signed)
01/04/2013 - 01/06/2013  12:13 PM  PATIENT:  Brandon Schaefer  57 y.o. male  PRE-OPERATIVE DIAGNOSIS:  LEFT STAGHORN, RIGHT SMALL RENAL STONES  POST-OPERATIVE DIAGNOSIS:  left staghorn, right small renal stones  PROCEDURE:  Procedure(s): NEPHROLITHOTOMY PERCUTANEOUS SECOND LOOK (Left) CYSTOSCOPY WITH RIGHT RETROGRADE PYELOGRAM, URETEROSCOPY AND BILATERAL STENT EXCHANGE, BILATERAL STONE BASKET EXTRACTION  (Right) REMOVAL PORT-A-CATH (Left) EXCISION CHRONIC ABDOMINAL WOUND (N/A)  SURGEON:  Surgeon(s) and Role: Panel 1:    * Sebastian Ache, MD - Primary  Panel 2:    * Shelly Rubenstein, MD - Primary  PHYSICIAN ASSISTANT:   ASSISTANTS: none   ANESTHESIA:   general  EBL:  Total I/O In: 1000 [I.V.:1000] Out: -   BLOOD ADMINISTERED:none  DRAINS: 1 - Foley to straight drain, 2 - Left Nephroureteral Stent (capped) with osotmy appliance around   LOCAL MEDICATIONS USED:  NONE  SPECIMEN:  Source of Specimen:  Rt renal stones, Left renal and ureteral stones  DISPOSITION OF SPECIMEN:  [facility] for compositional analysis  COUNTS:  YES  TOURNIQUET:  * No tourniquets in log *  DICTATION: .Other Dictation: Dictation Number [id number]  PLAN OF CARE: Admit to inpatient   PATIENT DISPOSITION:  PACU - hemodynamically stable.   Delay start of Pharmacological VTE agent (>24hrs) due to surgical blood loss or risk of bleeding: yes

## 2013-01-06 NOTE — Interval H&P Note (Signed)
History and Physical Interval Note: no change in H and P  01/06/2013 8:04 AM  Brandon Schaefer  has presented today for surgery, with the diagnosis of LEFT STAGHORN, RIGHT SMALL RENAL STONES  The various methods of treatment have been discussed with the patient and family. After consideration of risks, benefits and other options for treatment, the patient has consented to  Procedure(s): NEPHROLITHOTOMY PERCUTANEOUS SECOND LOOK (Left) CYSTOSCOPY WITH RETROGRADE PYELOGRAM, URETEROSCOPY AND STENT PLACEMENT  CYSTO, RIGHT URETEROSCOPY WITH LASER AND STENT CHANGE (Right) HOLMIUM LASER APPLICATION (N/A) REMOVAL PORT-A-CATH (N/A) EXCISION CHRONIC ABDOMINAL WOUND (N/A) as a surgical intervention .  The patient's history has been reviewed, patient examined, no change in status, stable for surgery.  I have reviewed the patient's chart and labs.  Questions were answered to the patient's satisfaction.     [name] A

## 2013-01-06 NOTE — Progress Notes (Signed)
Dr. Berneice Heinrich in and talked with patient-aware that urine is red in color- checked urostomy pouch- left flank area

## 2013-01-06 NOTE — H&P (View-Only) (Signed)
Brandon Schaefer is an 56 y.o. male.   Chief Complaint: Chronic abdominal wound, Port-A-Cath no longer needed HPI: This is a 56-year-old gentleman who had undergone a partial colectomy for colon cancer. Postoperatively he had a Port-A-Cath placed and has finished his IV chemotherapy. 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 He presents today for Port-A-Cath removal as well as excision of a chronic draining area on his midline abdominal wound. He is also being operated on for nephrolithiasis.  Past Medical History  Diagnosis Date  . Hypertension   . Diabetes mellitus   . Allergy   . Hyperlipidemia   . Anemia   . Cancer   . Anxiety   . Shortness of breath     with exertion  . Headache(784.0)   . Neuromuscular disorder     peripheral neuropathy  . History of blood transfusion     Past Surgical History  Procedure Laterality Date  . Fracture surgery       ORIF-left radius has pin  . Pilonidal cyst excision    . Partial colectomy  01/17/2012    Procedure: PARTIAL COLECTOMY;  Surgeon: Kayloni Rocco A Aanchal Cope, MD;  Location: [facility];  Service: General;;  . Proctoscopy  01/17/2012    Procedure: PROCTOSCOPY;  Surgeon: Selby Foisy A Chandrea Zellman, MD;  Location: [facility];  Service: General;;  . Portacath placement  03/19/2012    Procedure: INSERTION PORT-A-CATH;  Surgeon: Hazleigh Mccleave A Alyna Stensland, MD;  Location: [facility];  Service: General;  Laterality: N/A;    Family History  Problem Relation Age of Onset  . Diabetes Father   . Cancer Maternal Aunt     colon  . Cancer Maternal Grandmother     colon   Social History:  reports that he has never smoked. He has never used smokeless tobacco. He reports that he does not drink alcohol or use illicit drugs.  Allergies:  Allergies  Allergen Reactions  . Adhesive [Tape]     Medications Prior to Admission  Medication Sig Dispense Refill  . carvedilol (COREG) 6.25 MG  tablet Take 6.25 mg by mouth 2 (two) times daily with a meal.      . metFORMIN (GLUCOPHAGE) 1000 MG tablet Take 1,000 mg by mouth daily with breakfast.       . pioglitazone-metformin (ACTOPLUS MET) 15-850 MG per tablet Take 1 tablet by mouth 2 (two) times daily with a meal.      . ramipril (ALTACE) 10 MG capsule Take 10 mg by mouth daily before breakfast.      . rosuvastatin (CRESTOR) 20 MG tablet Take 20 mg by mouth daily before breakfast.      . sertraline (ZOLOFT) 100 MG tablet Take 150 mg by mouth daily before breakfast.      . TRADJENTA 5 MG TABS tablet Take by mouth daily.         Results for orders placed during the hospital encounter of 01/04/13 (from the past 48 hour(s))  GLUCOSE, CAPILLARY     Status: Abnormal   Collection Time    01/04/13  8:50 AM      Result Value Range   Glucose-Capillary 185 (*) 70 - 99 mg/dL   Comment 1 Notify RN    TYPE AND SCREEN     Status: None   Collection Time    01/04/13 12:00 PM      Result Value Range   ABO/RH(D) O POS       Antibody Screen NEG     Sample Expiration 01/07/2013    GLUCOSE, CAPILLARY     Status: Abnormal   Collection Time    01/04/13  4:19 PM      Result Value Range   Glucose-Capillary 201 (*) 70 - 99 mg/dL   Comment 1 Documented in Chart     Comment 2 Notify RN    HEMOGLOBIN AND HEMATOCRIT, BLOOD     Status: None   Collection Time    01/04/13  4:27 PM      Result Value Range   Hemoglobin 13.2  13.0 - 17.0 g/dL   HCT 39.6  39.0 - 52.0 %  BASIC METABOLIC PANEL     Status: Abnormal   Collection Time    01/04/13  4:27 PM      Result Value Range   Sodium 137  135 - 145 mEq/L   Potassium 5.4 (*) 3.5 - 5.1 mEq/L   Comment: MODERATE HEMOLYSIS     HEMOLYSIS AT THIS LEVEL MAY AFFECT RESULT   Chloride 101  96 - 112 mEq/L   CO2 27  19 - 32 mEq/L   Glucose, Bld 208 (*) 70 - 99 mg/dL   BUN 12  6 - 23 mg/dL   Creatinine, Ser 1.04  0.50 - 1.35 mg/dL   Calcium 8.7  8.4 - 10.5 mg/dL   GFR calc non Af Amer 77 (*) >90 mL/min   GFR  calc Af Amer 90 (*) >90 mL/min   Comment: (NOTE)     The eGFR has been calculated using the CKD EPI equation.     This calculation has not been validated in all clinical situations.     eGFR's persistently <90 mL/min signify possible Chronic Kidney     Disease.  GLUCOSE, CAPILLARY     Status: Abnormal   Collection Time    01/04/13  6:53 PM      Result Value Range   Glucose-Capillary 154 (*) 70 - 99 mg/dL  GLUCOSE, CAPILLARY     Status: Abnormal   Collection Time    01/04/13 10:08 PM      Result Value Range   Glucose-Capillary 177 (*) 70 - 99 mg/dL  BASIC METABOLIC PANEL     Status: Abnormal   Collection Time    01/05/13  3:50 AM      Result Value Range   Sodium 139  135 - 145 mEq/L   Potassium 4.7  3.5 - 5.1 mEq/L   Chloride 104  96 - 112 mEq/L   CO2 26  19 - 32 mEq/L   Glucose, Bld 163 (*) 70 - 99 mg/dL   BUN 13  6 - 23 mg/dL   Creatinine, Ser 0.92  0.50 - 1.35 mg/dL   Calcium 8.5  8.4 - 10.5 mg/dL   GFR calc non Af Amer >90  >90 mL/min   GFR calc Af Amer >90  >90 mL/min   Comment: (NOTE)     The eGFR has been calculated using the CKD EPI equation.     This calculation has not been validated in all clinical situations.     eGFR's persistently <90 mL/min signify possible Chronic Kidney     Disease.  HEMOGLOBIN AND HEMATOCRIT, BLOOD     Status: Abnormal   Collection Time    01/05/13  3:50 AM      Result Value Range   Hemoglobin 11.7 (*) 13.0 - 17.0 g/dL   HCT 34.8 (*) 39.0 - 52.0 %  GLUCOSE,   CAPILLARY     Status: Abnormal   Collection Time    01/05/13  7:11 AM      Result Value Range   Glucose-Capillary 180 (*) 70 - 99 mg/dL   Dg Abd 1 View  01/04/2013   CLINICAL DATA:  Percutaneous nephrolithotomy.  EXAM: ABDOMEN - 1 VIEW  COMPARISON:  CT 10/19/2012.  FINDINGS: Series of 4 intraoperative fluoroscopic spot films demonstrate a percutaneous nephrostomy on the left. Retrograde catheter is identified on some of the images. The final image demonstrates retrieval of stone  with a nephrostomy tube coiled in the region of the left renal pelvis.  IMPRESSION: Left percutaneous nephrostomy and stone retrieval.   Electronically Signed   By: Geoffrey  Lamke M.D.   On: 01/04/2013 16:43   Dg Retrograde Pyelogram  01/04/2013   CLINICAL DATA:  Bilateral retrograde ureteral pyelography. History of renal stones.  EXAM: RETROGRADE PYELOGRAM  COMPARISON:  CT, 10/29/2012  FINDINGS: Images submitted for review show the retrograde injections of both ureters with subsequent passage of wires and placement of ureteral stents.  The left intrarenal collecting system is dilated.  There are degenerative changes of the visualized spine. No osteoblastic or osteolytic lesions. The soft tissues are unremarkable.  IMPRESSION: Bilateral retrograde ureteral pyelography and bilateral ureteral stent placement.   Electronically Signed   By: David  Ormond   On: 01/04/2013 18:54   Dg C-arm 61-120 Min-no Report  01/04/2013   CLINICAL DATA: kidney stone   C-ARM 61-120 MINUTES  Fluoroscopy was utilized by the requesting physician.  No radiographic  interpretation.     Review of Systems  All other systems reviewed and are negative.    There were no vitals taken for this visit. Physical Exam  Constitutional: He is oriented to person, place, and time. No distress.  Morbidly obese  HENT:  Head: Normocephalic and atraumatic.  Eyes: Conjunctivae are normal. Pupils are equal, round, and reactive to light.  Neck: Normal range of motion. Neck supple.  Cardiovascular: Normal rate, regular rhythm and normal heart sounds.   Respiratory: Effort normal and breath sounds normal.  GI: Soft. Bowel sounds are normal.  Chronic abdominal scar with open wound and drainage.  Musculoskeletal: Normal range of motion.  Neurological: He is alert and oriented to person, place, and time.  Skin: Skin is warm and dry.  Psychiatric: His behavior is normal.     Assessment/Plan Nonhealing surgical wound Port-A-Cath no  longer needed  I will proceed with excision of the nonhealing surgical wound. I will also proceed with Port-A-Cath removal. I discussed the risks which includes but is not limited to bleeding, ongoing infection, having another chronic open abdominal wound, need for further surgery, et cetera. I also discussed postoperative cardiopulmonary issues and DVT. He understands and wishes to proceed. Surgery will be scheduled  Mazi Brailsford A 01/05/2013, 10:22 AM    

## 2013-01-07 ENCOUNTER — Encounter (HOSPITAL_COMMUNITY): Payer: Self-pay | Admitting: Urology

## 2013-01-07 LAB — BASIC METABOLIC PANEL
BUN: 16 mg/dL (ref 6–23)
CO2: 27 mEq/L (ref 19–32)
Chloride: 103 mEq/L (ref 96–112)
Creatinine, Ser: 0.92 mg/dL (ref 0.50–1.35)
Potassium: 4.6 mEq/L (ref 3.5–5.1)

## 2013-01-07 LAB — GLUCOSE, CAPILLARY
Glucose-Capillary: 158 mg/dL — ABNORMAL HIGH (ref 70–99)
Glucose-Capillary: 156 mg/dL — ABNORMAL HIGH (ref 70–99)
Glucose-Capillary: 172 mg/dL — ABNORMAL HIGH (ref 70–99)

## 2013-01-07 NOTE — Progress Notes (Signed)
Patient ID: Brandon Schaefer, male   DOB: [birth year], 57 y.o.   MRN: [id number]  Feeling and looking better than yesterday No pain from abdomen  Dressings intact  Plan to remove packing tomorrow

## 2013-01-07 NOTE — Op Note (Signed)
NAMEMarland Kitchen  Brandon Schaefer NO.:  [id number]  MEDICAL RECORD NO.:  [id number]  LOCATION:  1403                         FACILITY:  [facility]  PHYSICIAN:  Brandon Ache, MD     DATE OF BIRTH:  Jan 25, 1956  DATE OF PROCEDURE:  01/06/2013 DATE OF DISCHARGE:                              OPERATIVE REPORT   DIAGNOSES: 1. Residual left renal and ureteral stone. 2. Right renal stone.  PROCEDURES: 1. Cystoscopy with right retrograde pyelogram and interpretation. 2. Exchange of right ureteral stent. 3. Right ureteroscopy with basketing of renal stones. 4. Left second stage percutaneous nephrolithotomy stone, less than 2     cm. 5. Left ureteroscopy with basketing of stone. 6. Exchange of left ureteral stent. 7. Left antegrade nephrostogram with interpretation.  DRAINS: 1. Left nephroureteral stent capped.  There is an ostomy appliance     around this in the left flank 2. Foley catheter straight drain with right distal tethered stent     fashioned to this.  ESTIMATED BLOOD LOSS:  100 mL.  COMPLICATIONS:  None.  SPECIMENS:  Right and left urolithiasis for compositional analysis.  FINDINGS: 1. Multifocal right small renal stones, total stone volume     approximately 8 mm. 2. Unremarkable right retrograde pyelogram. 3. Multifocal small volume left residual renal stone, total stone     volume approximately 6 mm. 4. Residual left ureteral stone, total stone volume approximately 8     mm. 5. Unremarkable left antegrade nephrostogram.  INDICATIONS:  Brandon Schaefer is a pleasant morbidly obese, 57 year old gentleman, who unfortunately has had problems with colon cancer with Now s/p resection.  He was found on surveillance imaging of this to have large volume left renal ureteral stones and right renal stones with severe left hydronephrosis.  He underwent first stage percutaneous approach to his left side on January 04, 2013 and right ureteral stenting at this time with  plan for second-stage procedure today.  This is being performed today in conjunction with Brandon Schaefer of General Surgery.  He was performing a wound debridement and Port-A-Cath removal.  Informed consent was obtained and placed in the medical record.  PROCEDURE IN DETAIL:  The patient being Brandon Schaefer was verified. Procedure being left second stage percutaneous nephrostolithotomy.  The right ureteroscopy with stone manipulation was confirmed.  Procedure was carried out.  Time-out was performed.  Intravenous antibiotics administered.  Had been administered.  Brandon Schaefer had previously performed his portion of the procedure today as per separate operative note.  The patient was fashioned first into a modified lithotomy position applying a large bump under his left flank, which allowed access to the previous percutaneous site.  He was further fashioned on the operative table using 3-inch tape over foam padding, taking great care of all the bony prominences including lateral aspect of the knees were appropriately padded, and then 2 new sterile fields were created prepping his penis perineum, proximal thighs using iodine x3, and his left flank including his previous left nephrostomy tube and nephroureteral stent using chlorhexidine gluconate, and separate drapes were applied. Notable his very large panus and pre-pubic fat pad were held in position via 3 inch taping to allow acces  to these operative sights.  Initial attention was directed to the right side. Cystoscopy was performed using 22-French rigid cystoscope with 12-degree offset lens.  Inspection of the anterior and posterior urethra was unremarkable.  Again, the patient did considerable high-riding bladder neck, and then the  distal end of the right ureteral stent was grasped, brought to the level of urethral meatus through which a 0.038 Glidewire was advanced at the level of the upper pole on the right side. Using cystoscopic  guidance, a separate Sensor wire was advanced to the right upper pole, acting as a working wire over which a new 12/14, 38-cm ureteral access sheath was carefully advanced to the mid ureter.  Next, flexible digital ureteroscopy was performed of the proximal ureter and entire right kidney, and as expected this revealed multifocal small volume right renal stone.  Total stone volume approximately 8 mm2.  This was most concentrated in the lower pole, a escape-type basket was used to grasp the stone individually and remove in their entirety, set aside for compositional analysis.  Repeat systematic inspection of right kidney revealed complete resolution of all stones.  The sheath was removed under continuous ureteroscopic vision and no mucosal abnormalities.  Stones were found on the right side.  Finally, a new 6 x 26 double-J stent was placed with remaining safety wire using cystoscopic and fluoroscopic guidance.  Good proximal and distal curl were noted.  The tether was left in place and a Foley catheter was placed per urethra straight drain, 16-French.  The tether was fashioned to this.  Attention was directed to the left side.  First, the left antegrade nephrostogram was performed.  Left antegrade nephrostogram revealed single system left kidney without filling defects or narrowing or excessive extravasation.  The nephrostomy tube was then carefully removed leaving the previous nephroureteral stent acting as a safety wire.  Next, flexible nephroscopy was performed using a 16-French flexible cystoscope.  There was small volume residual stone that was located in the left lower pole of the kidney.  Total stone volume approximately 6 mm.  This was grasped and brought out in its entirety.  Given the previously known impacted left ureteral stone, certainly wanted to make sure that the left ureter was cleared of all stone fragments, and as such a separate Sensor working wire was carefully  navigated down the left ureter using nephroscopic and fluoroscopic guidance.  Over this, a new 12/14, 38-cm ureteral access sheath was carefully positioned just at the line of very proximal aspect of the ureter.  Next, 8-French flexible digital ureteroscope was used to performed endoscopy of the middle two-thirds of the left ureter towards the distal third and below the previous site of impacted stone.  Several residual stone fragments were seen.  These were then grasped with escape basket and brought out in their entirety.  The 8-French ureteroscope, however, was too large to navigate the distal most intramural ureter and as such was exchanged for the 6-French non-Digital ureteroscope which then allowed passage through the distal ureter.  Inspection of the remainder of the ureter all the way to the level of the urinary bladder.  Two distal small fragments were found in the ureter and removed.  Repeat panendoscopy of the left ureter with removal of the sheath revealed no evidence of residual stones or mucosal abnormalities.  Given the patient's anatomy, it was felt that will be simplest to leave him with the left nephroureteral stent as given his morbid obesity and high-riding bladder neck, it was felt  that cystoscopic removal be very, very challenging in the office setting.  As such, a separate Glidewire was advanced to the level of the urinary bladder, and a new KMP catheter acting as nephroureteral stent was carefully advanced and positioned into the distal end of the urinary bladder.  The previous nephroureteral stent was removed.  The percutaneous site was closed with skin using interrupted silk.  Left ureteral stent was fashioned to this. An ostomy appliance was placed over this to collect any peri-nephrostomy drainage. The procedure was then terminated.  The patient tolerated the procedure well without immediate periprocedural complications.  The patient was taken to postanesthesia  care unit in stable condition.          ______________________________ Brandon Ache, MD     TM/MEDQ  D:  01/06/2013  T:  01/07/2013  Job:  [id number]

## 2013-01-07 NOTE — Op Note (Signed)
NAMEMarland Kitchen  [name], [name] NO.:  [id number]  MEDICAL RECORD NO.:  [id number]  LOCATION:  1403                         FACILITY:  [facility]  PHYSICIAN:  [name], M.D. DATE OF BIRTH:  01-22-1956  DATE OF PROCEDURE:  01/06/2013 DATE OF DISCHARGE:                              OPERATIVE REPORT   PREOPERATIVE DIAGNOSES: 1. Port-A-Cath no longer needed. 2. Chronic nonhealing abdominal wound.  POSTOPERATIVE DIAGNOSES: 1. Port-A-Cath no longer needed. 2. Chronic nonhealing abdominal wound.  PROCEDURES: 1. Left subclavian Port-A-Cath removal. 2. Excision of chronic nonhealing abdominal wound.  SURGEON:  [name], MD  ANESTHESIA:  General and 0.5% Marcaine.  ESTIMATED BLOOD LOSS:  Minimal.  PROCEDURE IN DETAIL:  The patient was brought to the operating room, identified as Brandon Schaefer.  He was placed supine on the operating room table, and general anesthesia was induced.  His chest and abdomen were then prepped and draped in usual sterile fashion.  I made an incision through a previous scar on the left chest over the top of the Port-A- Cath with scalpel and took this down to the port which was easily identified.  I cut the sutures to the port and removed the port and the catheter in its entirety.  I then closed the catheter tract with a figure-of-eight 3-0 Vicryl suture.  I then anesthetized the wound with Marcaine.  I then closed subcutaneous tissue with interrupted 3-0 Vicryl sutures and closed the skin with running 4-0 Monocryl.  Dermabond, Steri- Strips, and Tegaderm were then applied.  Next, I performed elliptical incision around the patient's lower open midline wound.  This was a small open draining area.  I took this down to the chronic granulation tissue with the electrocautery.  I then excised all the chronic granulation tissue going down to the fascia with the cautery.  No purulence was identified.  I then thoroughly irrigated the wound  with normal saline.  I achieved hemostasis with cautery.  I anesthetized the wound with Marcaine.  I then closed the skin with interrupted 2-0 nylon sutures and packed the __________ with a packing gauze.  Dry gauze was then placed over top of this __________.  The patient tolerated the procedure well.  All the counts were correct at the end of procedure.  The patient was then continued on in the operating room for the urologist, Dr. Berneice Heinrich to continue the procedure.     [name], M.D.     DB/MEDQ  D:  01/06/2013  T:  01/06/2013  Job:  [id number]

## 2013-01-07 NOTE — Evaluation (Signed)
Physical Therapy Evaluation Patient Details Name: Brandon Schaefer MRN: [id number] DOB: Apr 08, 1956 Today's Date: 01/07/2013 Time: 1308-6578 PT Time Calculation (min): 27 min  PT Assessment / Plan / Recommendation History of Present Illness  This is a 57 year old gentleman who had undergone a partial colectomy for colon cancer. Postoperatively he had a Port-A-Cath placed and has finished his IV chemotherapy. 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 He presents today for Port-A-Cath removal as well as excision of a chronic draining area on his midline abdominal wound. He is also being operated on for nephrolithiasis.  Clinical Impression  Pt will benefit form PT  In acute setting to allow increased mobility and allow return to home setting; I have encouraged him to sue the IS as well as work on ankle pumps and quad sets while in the bed; He really needs to be OOB on a daily basis.    PT Assessment  Patient needs continued PT services    Follow Up Recommendations  Home health PT    Does the patient have the potential to tolerate intense rehabilitation      Barriers to Discharge Decreased caregiver support      Equipment Recommendations  None recommended by PT    Recommendations for Other Services     Frequency Min 3X/week    Precautions / Restrictions Precautions Precaution Comments: abdominal wound; left posterior flank ostomy bag Restrictions Weight Bearing Restrictions: No   Pertinent Vitals/Pain C/o diffuse abd pain and soreness; RN is aware      Mobility  Bed Mobility Bed Mobility: Supine to Sit Supine to Sit: 4: Min assist Details for Bed Mobility Assistance: min to initiate movement of LEs and using therapist hand to bring trunk forward as pt unaable to push from bed or reach rail due to body habitus Transfers Transfers: Sit to Stand;Stand to Sit;Stand Pivot Transfers Sit to Stand: 1: +2 Total  assist;From bed Sit to Stand: Patient Percentage: 80% Stand to Sit: 1: +2 Total assist Stand to Sit: Patient Percentage: 80% Stand Pivot Transfers: 1: +2 Total assist Stand Pivot Transfers: Patient Percentage: 80% Details for Transfer Assistance: verbal cues for RW safety, hand placement; +2 for IV and safety due to pt size;  Ambulation/Gait Ambulation/Gait Assistance Details: pt able to take pivotal steps to chair with RW    Exercises     PT Diagnosis: Difficulty walking  PT Problem List: Decreased strength;Decreased activity tolerance;Decreased balance;Decreased mobility;Decreased range of motion PT Treatment Interventions: DME instruction;Gait training;Functional mobility training;Therapeutic activities;Therapeutic exercise;Patient/family education     PT Goals(Current goals can be found in the care plan section) Acute Rehab PT Goals Patient Stated Goal: home PT Goal Formulation: With patient Time For Goal Achievement: 01/21/13 Potential to Achieve Goals: Good  Visit Information  Last PT Received On: 01/07/13 Assistance Needed:  (for safety due to pt size) History of Present Illness: This is a 57 year old gentleman who had undergone a partial colectomy for colon cancer. Postoperatively he had a Port-A-Cath placed and has finished his IV chemotherapy. 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 He presents today for Port-A-Cath removal as well as excision of a chronic draining area on his midline abdominal wound. He is also being operated on for nephrolithiasis.       Prior Functioning  Home Living Family/patient expects to be discharged to:: Private residence Living Arrangements:  Parent (and aunt) Type of Home: House Home Access: Stairs to enter Secretary/administrator of Steps: 3 Home Layout: One level Home Equipment: Environmental consultant - 2 wheels Prior Function Level of Independence:  Independent Communication Communication: No difficulties    Cognition  Cognition Arousal/Alertness: Awake/alert Behavior During Therapy: WFL for tasks assessed/performed;Flat affect Overall Cognitive Status: Within Functional Limits for tasks assessed    Extremity/Trunk Assessment Upper Extremity Assessment Upper Extremity Assessment: Overall WFL for tasks assessed Lower Extremity Assessment Lower Extremity Assessment: Overall WFL for tasks assessed   Balance Static Standing Balance Static Standing - Balance Support: During functional activity;Bilateral upper extremity supported Static Standing - Level of Assistance: 4: Min assist  End of Session PT - End of Session Equipment Utilized During Treatment: Gait belt Activity Tolerance: Patient tolerated treatment well Patient left: in chair;with call bell/phone within reach Nurse Communication: Mobility status  GP     [facility] 01/07/2013, 11:51 AM

## 2013-01-07 NOTE — Progress Notes (Signed)
1 Day Post-Op  Subjective:  1 - Left > Right Nephrolithaisis - Pt with Left 2cm renal + 6mm lower pole + 9mm prox ureteral stone wtih moderate hydro as well as right punctate stones incidetnal on CT for abdominal wound / colon cancer surveillance. Stone 1000HU density wtih skin-stone distance 18cm. Now s/p first stage left percutaneous nephrostolithotomy on 9/22  and second stage left percutaneous nephrostolithotomy with right ureteroscopic stone manipulation 9/24. Presently with Rt tethered JJ uretral stent fastened to foley catheter and left nephroureteral stent in place.   Today Brandon Schaefer is continuing to improve. Now maintining diet. PT eval recs home with PT but no need for additional services at discharge.   Objective: Vital signs in last 24 hours: Temp:  [98 F (36.7 C)-98.5 F (36.9 C)] 98.5 F (36.9 C) (09/25 1454) Pulse Rate:  [88-102] 88 (09/25 1454) Resp:  [18-20] 20 (09/25 0615) BP: (107-158)/(61-68) 107/68 mmHg (09/25 1454) SpO2:  [96 %-100 %] 96 % (09/25 1454) Last BM Date: 01/04/13  Intake/Output from previous day: 09/24 0701 - 09/25 0700 In: 3015 [P.O.:600; I.V.:2415] Out: 1275 [Urine:1275] Intake/Output this shift: Total I/O In: 1440 [P.O.:840; I.V.:600] Out: 600 [Urine:600]  General appearance: alert, appears stated age and morbidly obese Head: Normocephalic, without obvious abnormality, atraumatic Eyes: conjunctivae/corneas clear. PERRL, EOM's intact. Fundi benign. Ears: normal TM's and external ear canals both ears Nose: Nares normal. Septum midline. Mucosa normal. No drainage or sinus tenderness. Throat: lips, mucosa, and tongue normal; teeth and gums normal Neck: no adenopathy, no carotid bruit, no JVD, supple, symmetrical, trachea midline and thyroid not enlarged, symmetric, no tenderness/mass/nodules Back: symmetric, no curvature. ROM normal. No CVA tenderness., left nephroureteral stent capped wtih small volume peri-tube drainage. Resp: clear to auscultation  bilaterally Chest wall: no tenderness Cardio: regular rate and rhythm, S1, S2 normal, no murmur, click, rub or gallop GI: soft, non-tender; bowel sounds normal; no masses,  no organomegaly  Lab Results:   Recent Labs  01/05/13 0350 01/07/13 0425  HGB 11.7* 9.5*  HCT 34.8* 29.7*   BMET  Recent Labs  01/05/13 0350 01/07/13 0425  NA 139 138  K 4.7 4.6  CL 104 103  CO2 26 27  GLUCOSE 163* 144*  BUN 13 16  CREATININE 0.92 0.92  CALCIUM 8.5 8.3*   PT/INR No results found for this basename: LABPROT, INR,  in the last 72 hours ABG No results found for this basename: PHART, PCO2, PO2, HCO3,  in the last 72 hours  Studies/Results: Dg Abd 1 View  01/06/2013   CLINICAL DATA:  Bilateral stone removal and stent placement, left percutaneous nephrolithotomy  EXAM: ABDOMEN - 1 VIEW  COMPARISON:  01/04/2013  FINDINGS: Decreased dilatation of the left renal collecting system post nephrolithotomy.  Nondilated right renal collecting system.  Bilateral ureteral stents are placed of these are incompletely visualized on the submitted images. No definite urinary tract calcification is evident.  IMPRESSION: Decreased left hydroureteronephrosis post left nephrolithotomy.  Bilateral ureteral stents placed, incompletely visualized.   Electronically Signed   By: Ulyses Southward M.D.   On: 01/06/2013 13:36   Dg C-arm 61-120 Min-no Report  01/06/2013   CLINICAL DATA: surgery   C-ARM 61-120 MINUTES  Fluoroscopy was utilized by the requesting physician.  No radiographic  interpretation.     Anti-infectives: Anti-infectives   Start     Dose/Rate Route Frequency Ordered Stop   01/06/13 0830  gentamicin (GARAMYCIN) 620 mg in dextrose 5 % 100 mL IVPB  Status:  Discontinued  620 mg 115.5 mL/hr over 60 Minutes Intravenous Every 24 hours 01/06/13 0827 01/06/13 1431   01/04/13 0600  gentamicin (GARAMYCIN) 610 mg in dextrose 5 % 100 mL IVPB     610 mg 115.3 mL/hr over 60 Minutes Intravenous  Once 01/03/13 1915  01/04/13 1152      Assessment/Plan:  1 - Left > Right Nephrolithaisis - - Now clinically stone free. Will plan for DC tomorrow as long as hgb stable and HH PT arranged.   [facility], Brandon Schaefer 01/07/2013

## 2013-01-08 LAB — GLUCOSE, CAPILLARY
Glucose-Capillary: 155 mg/dL — ABNORMAL HIGH (ref 70–99)
Glucose-Capillary: 128 mg/dL — ABNORMAL HIGH (ref 70–99)
Glucose-Capillary: 180 mg/dL — ABNORMAL HIGH (ref 70–99)

## 2013-01-08 MED ORDER — BELLADONNA ALKALOIDS-OPIUM 16.2-60 MG RE SUPP: 1.0000 | Freq: Four times a day (QID) | RECTAL | Status: DC | PRN

## 2013-01-08 MED ORDER — BACITRACIN-NEOMYCIN-POLYMYXIN 400-5-5000 EX OINT
1.0000 "application " | TOPICAL_OINTMENT | Freq: Three times a day (TID) | CUTANEOUS | Status: DC | PRN
  Filled 2013-01-08: qty 1

## 2013-01-08 MED ORDER — ALUM & MAG HYDROXIDE-SIMETH 200-200-20 MG/5ML PO SUSP
30.0000 mL | ORAL | Status: DC | PRN
  Administered 2013-01-08: 30 mL via ORAL
  Filled 2013-01-08: qty 30

## 2013-01-08 MED ORDER — ACETAMINOPHEN 325 MG PO TABS: 650.0000 mg | ORAL_TABLET | ORAL | Status: DC | PRN

## 2013-01-08 MED ORDER — ZOLPIDEM TARTRATE 5 MG PO TABS
5.0000 mg | ORAL_TABLET | Freq: Every evening | ORAL | Status: DC | PRN
  Administered 2013-01-11: 5 mg via ORAL
  Filled 2013-01-08: qty 1

## 2013-01-08 MED ORDER — PANTOPRAZOLE SODIUM 40 MG PO TBEC
40.0000 mg | DELAYED_RELEASE_TABLET | Freq: Two times a day (BID) | ORAL | Status: DC
  Administered 2013-01-08 – 2013-01-11 (×6): 40 mg via ORAL
  Filled 2013-01-08 (×7): qty 1

## 2013-01-08 NOTE — Progress Notes (Signed)
2 Days Post-Op  Subjective:  1 - Left > Right Nephrolithaisis - Pt with Left 2cm renal + 6mm lower pole + 9mm prox ureteral stone wtih moderate hydro as well as right punctate stones incidetnal on CT for abdominal wound / colon cancer surveillance. Stone 1000HU density wtih skin-stone distance 18cm. Now s/p first stage left percutaneous nephrostolithotomy on 9/22  and second stage left percutaneous nephrostolithotomy with right ureteroscopic stone manipulation 9/24. Presently with Rt tethered JJ uretral stent fastened to foley catheter and left nephroureteral stent in place. He had wound debridement and port-a-cath removal at time of second stage procedure on 9/24 by general surgery as well.  PT eval 9/25 felt home with [facility] appropriate, but he has virtually no support where he lives.   Today Cote is continuing to slowly improve. Pain improving, nausea improved.   Objective: Vital signs in last 24 hours: Temp:  [97.3 F (36.3 C)-98.3 F (36.8 C)] 97.3 F (36.3 C) (09/26 0549) Pulse Rate:  [80-84] 80 (09/26 0549) Resp:  [20] 20 (09/26 0549) BP: (141-147)/(55-61) 141/55 mmHg (09/26 0549) SpO2:  [98 %-100 %] 100 % (09/26 0549) Last BM Date: 01/07/13  Intake/Output from previous day: 09/25 0701 - 09/26 0700 In: 3450 [P.O.:1800; I.V.:1650] Out: 1750 [Urine:1750] Intake/Output this shift: Total I/O In: 240 [P.O.:240] Out: 700 [Urine:700]  General appearance: alert, cooperative, appears older than stated age and mildly obese Head: Normocephalic, without obvious abnormality, atraumatic Eyes: conjunctivae/corneas clear. PERRL, EOM's intact. Fundi benign. Ears: normal TM's and external ear canals both ears Nose: Nares normal. Septum midline. Mucosa normal. No drainage or sinus tenderness. Throat: lips, mucosa, and tongue normal; teeth and gums normal Neck: no adenopathy, no carotid bruit, no JVD, supple, symmetrical, trachea midline and thyroid not enlarged, symmetric, no  tenderness/mass/nodules Back: symmetric, no curvature. ROM normal. No CVA tenderness. Resp: clear to auscultation bilaterally Chest wall: no tenderness Cardio: regular rate and rhythm, S1, S2 normal, no murmur, click, rub or gallop GI: soft, non-tender; bowel sounds normal; no masses,  no organomegaly Male genitalia: normal, foley cath c/d/i wtih dark urine in bag w/o clots. Extremities: extremities normal, atraumatic, no cyanosis or edema Pulses: 2+ and symmetric Skin: Skin color, texture, turgor normal. No rashes or lesions Lymph nodes: Cervical, supraclavicular, and axillary nodes normal. Neurologic: Grossly normal Incision/Wound: Lt nephrroureeral stent site c/d/i with some small volume drainage into pouch.  Lab Results:   Recent Labs  01/07/13 0425 01/08/13 0546  HGB 9.5* 9.3*  HCT 29.7* 28.0*   BMET  Recent Labs  01/07/13 0425  NA 138  K 4.6  CL 103  CO2 27  GLUCOSE 144*  BUN 16  CREATININE 0.92  CALCIUM 8.3*   PT/INR No results found for this basename: LABPROT, INR,  in the last 72 hours ABG No results found for this basename: PHART, PCO2, PO2, HCO3,  in the last 72 hours  Studies/Results: No results found.  Anti-infectives: Anti-infectives   Start     Dose/Rate Route Frequency Ordered Stop   01/06/13 0830  gentamicin (GARAMYCIN) 620 mg in dextrose 5 % 100 mL IVPB  Status:  Discontinued     620 mg 115.5 mL/hr over 60 Minutes Intravenous Every 24 hours 01/06/13 0827 01/06/13 1431   01/04/13 0600  gentamicin (GARAMYCIN) 610 mg in dextrose 5 % 100 mL IVPB     610 mg 115.3 mL/hr over 60 Minutes Intravenous  Once 01/03/13 1915 01/04/13 1152      Assessment/Plan:   1 - Left > Right  Nephrolithaisis - Now stone free. Afebrile and hgb stable. Plan for foley / rt stent pull (tied together) Monday and Lt nephroureeral stent pull in about 2 weeks.  2 - Disposition - Pt progressing very slowly post-op. I do not feel he is adequate for discharge to his home  today with his very limited support at this point. Will need few more days to grain strength or consider temporary stay at rehab facility.   [facility], Jerrelle Michelsen 01/08/2013

## 2013-01-08 NOTE — Progress Notes (Signed)
Call to Dr. Berneice Heinrich as pt is requesting "something for indigestion", pt is otherwise comfortable; he did eat a large breakfast.

## 2013-01-08 NOTE — Progress Notes (Signed)
Patient ID: Brandon Schaefer, male   DOB: 04/09/1956, 57 y.o.   MRN: [id number] Remains week  i removed wound packing.  Will cover with dry guaze.

## 2013-01-09 LAB — GLUCOSE, CAPILLARY
Glucose-Capillary: 152 mg/dL — ABNORMAL HIGH (ref 70–99)
Glucose-Capillary: 148 mg/dL — ABNORMAL HIGH (ref 70–99)

## 2013-01-09 NOTE — Progress Notes (Signed)
Patient ID: Brandon Schaefer, male   DOB: [birth year], 57 y.o.   MRN: [id number] [facility] Progress Note:   3 Days Post-Op  Subjective: Mental status is clear Objective: Vital signs in last 24 hours: Temp:  [98.1 F (36.7 C)-98.2 F (36.8 C)] 98.2 F (36.8 C) (09/27 0537) Pulse Rate:  [74-79] 74 (09/27 0537) Resp:  [20] 20 (09/27 0537) BP: (112-146)/(43-59) 146/59 mmHg (09/27 0537) SpO2:  [97 %] 97 % (09/27 0537)  Intake/Output from previous day: 09/26 0701 - 09/27 0700 In: 720 [P.O.:720] Out: 1875 [Urine:1875] Intake/Output this shift:    Physical Exam: Work of breathing is not labored.  In bari bed.  Incision dressing intact and wound not red.    Lab Results:  Results for orders placed during the hospital encounter of 01/04/13 (from the past 48 hour(s))  GLUCOSE, CAPILLARY     Status: Abnormal   Collection Time    01/07/13 11:15 AM      Result Value Range   Glucose-Capillary 156 (*) 70 - 99 mg/dL  GLUCOSE, CAPILLARY     Status: Abnormal   Collection Time    01/07/13  5:06 PM      Result Value Range   Glucose-Capillary 143 (*) 70 - 99 mg/dL   Comment 1 Documented in Chart     Comment 2 Notify RN    GLUCOSE, CAPILLARY     Status: Abnormal   Collection Time    01/07/13  9:15 PM      Result Value Range   Glucose-Capillary 172 (*) 70 - 99 mg/dL   Comment 1 Documented in Chart     Comment 2 Notify RN    HEMOGLOBIN AND HEMATOCRIT, BLOOD     Status: Abnormal   Collection Time    01/08/13  5:46 AM      Result Value Range   Hemoglobin 9.3 (*) 13.0 - 17.0 g/dL   HCT 82.9 (*) 56.2 - 13.0 %  GLUCOSE, CAPILLARY     Status: Abnormal   Collection Time    01/08/13  7:19 AM      Result Value Range   Glucose-Capillary 158 (*) 70 - 99 mg/dL   Comment 1 Documented in Chart     Comment 2 Notify RN    GLUCOSE, CAPILLARY     Status: Abnormal   Collection Time    01/08/13 11:31 AM      Result Value Range   Glucose-Capillary 155 (*) 70 - 99 mg/dL   Comment 1  Documented in Chart     Comment 2 Notify RN    GLUCOSE, CAPILLARY     Status: Abnormal   Collection Time    01/08/13  4:54 PM      Result Value Range   Glucose-Capillary 128 (*) 70 - 99 mg/dL   Comment 1 Documented in Chart     Comment 2 Notify RN    GLUCOSE, CAPILLARY     Status: Abnormal   Collection Time    01/08/13  9:54 PM      Result Value Range   Glucose-Capillary 180 (*) 70 - 99 mg/dL  GLUCOSE, CAPILLARY     Status: Abnormal   Collection Time    01/09/13  7:48 AM      Result Value Range   Glucose-Capillary 152 (*) 70 - 99 mg/dL    Radiology/Results: No results found.  Anti-infectives: Anti-infectives   Start     Dose/Rate Route Frequency Ordered Stop   01/06/13 0830  gentamicin (GARAMYCIN)  620 mg in dextrose 5 % 100 mL IVPB  Status:  Discontinued     620 mg 115.5 mL/hr over 60 Minutes Intravenous Every 24 hours 01/06/13 0827 01/06/13 1431   01/04/13 0600  gentamicin (GARAMYCIN) 610 mg in dextrose 5 % 100 mL IVPB     610 mg 115.3 mL/hr over 60 Minutes Intravenous  Once 01/03/13 1915 01/04/13 1152      Assessment/Plan: Problem List: Patient Active Problem List   Diagnosis Date Noted  . Anemia, iron deficiency 05/05/2012  . Postoperative wound infection 02/25/2012  . DM (diabetes mellitus) 01/18/2012  . HTN (hypertension) 01/18/2012  . Morbid obesity with body mass index of 60.0-69.9 in adult 01/17/2012  . Respiratory failure, post-operative 01/17/2012  . Cancer of sigmoid colon 12/23/2011    Dr. Magnus Ivan assisted in closing this patient and removing portacath.  Incisions OK 3 Days Post-Op    LOS: 5 days   Matt B. Daphine Deutscher, MD, [facility] [phone] beeper [phone]  01/09/2013 9:19 AM

## 2013-01-09 NOTE — Progress Notes (Signed)
Patient ID: Brandon Schaefer, male   DOB: 1955/06/09, 57 y.o.   MRN: [id number] 3 Days Post-Op   1 - Left > Right Nephrolithaisis - Pt with Left 2cm renal + 6mm lower pole + 9mm prox ureteral stone wtih moderate hydro as well as right punctate stones incidetnal on CT for abdominal wound / colon cancer surveillance. Stone 1000HU density wtih skin-stone distance 18cm. Now s/p first stage left percutaneous nephrostolithotomy on 9/22 and second stage left percutaneous nephrostolithotomy with right ureteroscopic stone manipulation 9/24. Presently with Rt tethered JJ uretral stent fastened to foley catheter and left nephroureteral stent in place. He had wound debridement and port-a-cath removal at time of second stage procedure on 9/24 by general surgery as well. PT eval 9/25 felt home with [facility] appropriate, but he has virtually no support where he lives.  Subjective:  Had a good night last night. No concerns or complications. Urine from Foley is medium tea-colored. No obvious significant active bleeding. Catheter appears to be draining well.   Patient reports   Objective: Vital signs in last 24 hours: Temp:  [98.1 F (36.7 C)-98.2 F (36.8 C)] 98.2 F (36.8 C) (09/27 0537) Pulse Rate:  [74-79] 74 (09/27 0537) Resp:  [20] 20 (09/27 0537) BP: (112-146)/(43-59) 146/59 mmHg (09/27 0537) SpO2:  [97 %] 97 % (09/27 0537)  Intake/Output from previous day: 09/26 0701 - 09/27 0700 In: 720 [P.O.:720] Out: 1875 [Urine:1875] Intake/Output this shift:    Physical Exam:  Constitutional: Vital signs reviewed. WD WN in NAD   Eyes: PERRL, No scleral icterus.   Cardiovascular: RRR Pulmonary/Chest: Normal effort Abdominal: Soft. Non-tender markedly obese Genitourinary: Foley catheter with tethered JJ stent draining well. Extremities: No cyanosis or edema   Lab Results:  Recent Labs  01/07/13 0425 01/08/13 0546  HGB 9.5* 9.3*  HCT 29.7* 28.0*   BMET  Recent Labs  01/07/13 0425  NA 138  K 4.6   CL 103  CO2 27  GLUCOSE 144*  BUN 16  CREATININE 0.92  CALCIUM 8.3*   No results found for this basename: LABPT, INR,  in the last 72 hours No results found for this basename: LABURIN,  in the last 72 hours Results for orders placed during the hospital encounter of 01/14/12  SURGICAL PCR SCREEN     Status: None   Collection Time    01/14/12  9:00 AM      Result Value Range Status   MRSA, PCR NEGATIVE  NEGATIVE Final   Staphylococcus aureus NEGATIVE  NEGATIVE Final   Comment:            The Xpert SA Assay (FDA     approved for NASAL specimens     in patients over 38 years of age),     is one component of     a comprehensive surveillance     program.  Test performance has     been validated by [employer] for patients greater     than or equal to 43 year old.     It is not intended     to diagnose infection nor to     guide or monitor treatment.    Studies/Results: No results found.  Assessment/Plan:   Plan as outlined by Dr. Emmaline Life note. Monitoring over the weekend with probable discharge early next week after Foley and stent removal.   LOS: 5 days   Brandon Schaefer S 01/09/2013, 8:03 AM

## 2013-01-10 LAB — GLUCOSE, CAPILLARY
Glucose-Capillary: 130 mg/dL — ABNORMAL HIGH (ref 70–99)
Glucose-Capillary: 142 mg/dL — ABNORMAL HIGH (ref 70–99)
Glucose-Capillary: 154 mg/dL — ABNORMAL HIGH (ref 70–99)

## 2013-01-10 MED ORDER — SODIUM CHLORIDE 0.9 % IV SOLN
INTRAVENOUS | Status: DC
  Administered 2013-01-10: via INTRAVENOUS

## 2013-01-10 NOTE — Progress Notes (Signed)
Patient ID: Brandon Schaefer, male   DOB: 1956-03-02, 57 y.o.   MRN: [id number] 4 Days Post-Op. No problems or concerns. He an uneventful day yesterday. Subjective: Patient reports   Objective: Vital signs in last 24 hours: Temp:  [98 F (36.7 C)-98.5 F (36.9 C)] 98 F (36.7 C) (09/28 0510) Pulse Rate:  [78-87] 87 (09/28 0510) Resp:  [20-21] 20 (09/28 0510) BP: (137-151)/(65-70) 148/70 mmHg (09/28 0510) SpO2:  [98 %-100 %] 100 % (09/28 0510)  Intake/Output from previous day: 09/27 0701 - 09/28 0700 In: -  Out: 1833 [Urine:1833] Intake/Output this shift:    Physical Exam:  Constitutional: Vital signs reviewed. WD WN in NAD   Eyes: PERRL, No scleral icterus.   Cardiovascular: RRR Pulmonary/Chest: Normal effort Abdominal: Soft. Non-tender, non-distended, bowel sounds are normal, no masses, organomegaly, or guarding present.  Genitourinary: Foley with dangle string to JJ stent draining rusty colored urine. No evidence of significant ongoing bleeding. Extremities: No cyanosis or edema   Lab Results:  Recent Labs  01/08/13 0546  HGB 9.3*  HCT 28.0*   BMET No results found for this basename: NA, K, CL, CO2, GLUCOSE, BUN, CREATININE, CALCIUM,  in the last 72 hours No results found for this basename: LABPT, INR,  in the last 72 hours No results found for this basename: LABURIN,  in the last 72 hours Results for orders placed during the hospital encounter of 01/14/12  SURGICAL PCR SCREEN     Status: None   Collection Time    01/14/12  9:00 AM      Result Value Range Status   MRSA, PCR NEGATIVE  NEGATIVE Final   Staphylococcus aureus NEGATIVE  NEGATIVE Final   Comment:            The Xpert SA Assay (FDA     approved for NASAL specimens     in patients over 44 years of age),     is one component of     a comprehensive surveillance     program.  Test performance has     been validated by [employer] for patients greater     than or equal to 66 year old.     It is not  intended     to diagnose infection nor to     guide or monitor treatment.    Studies/Results: No results found.  Assessment/Plan:   Foley and double-J stent removal and a.m. Probable discharge after that.   LOS: 6 days   Brandon Schaefer S 01/10/2013, 8:25 AM

## 2013-01-11 LAB — GLUCOSE, CAPILLARY: Glucose-Capillary: 151 mg/dL — ABNORMAL HIGH (ref 70–99)

## 2013-01-11 MED ORDER — SENNOSIDES-DOCUSATE SODIUM 8.6-50 MG PO TABS: 1.0000 | ORAL_TABLET | Freq: Two times a day (BID) | ORAL | Status: DC

## 2013-01-11 MED ORDER — SULFAMETHOXAZOLE-TMP DS 800-160 MG PO TABS: 1.0000 | ORAL_TABLET | Freq: Two times a day (BID) | ORAL | Status: DC

## 2013-01-11 MED ORDER — OXYCODONE-ACETAMINOPHEN 5-325 MG PO TABS: 1.0000 | ORAL_TABLET | ORAL | Status: DC | PRN

## 2013-01-11 NOTE — Discharge Summary (Signed)
Physician Discharge Summary  Patient ID: Brandon Schaefer MRN: [id number] DOB/AGE: January 30, 1956 57 y.o.  Admit date: 01/04/2013 Discharge date: 01/11/2013  Admission Diagnoses: Abdominal Wound, Bilateral Nephrolithaisis  Discharge Diagnoses:  Active Problems:   * No active hospital problems. *   Discharged Condition: good  Hospital Course:   1 - Left > Right Nephrolithaisis - Pt with Left 2cm renal + 6mm lower pole + 9mm prox ureteral stone wtih moderate hydro as well as right punctate stones incidetnal on CT for abdominal wound / colon cancer surveillance. Stone 1000HU density wtih skin-stone distance 18cm. Now s/p first stage left percutaneous nephrostolithotomy on 9/22 and second stage left percutaneous nephrostolithotomy with right ureteroscopic stone manipulation 9/24. Rt tethered JJ uretral stent fastened to foley catheter removed 9/29. Left nephroureteral stent remains in place.  PT eval 9/25 felt home with [facility] appropriate.   2 - Abdominal Wound - put underwent operative debridement by Dr. Rayburn Ma on 9/24 also with removal of left port-a-cath.    By 9/29, the day of discharge. Pt was ambulatory per baseline, pain controlled, tollerating diet, voiding w/o foley and felt to be adequate for discharge.   Consults: None  Significant Diagnostic Studies: labs: Hgb >9.  Treatments: surgery: as per above  Discharge Exam: Blood pressure 140/94, pulse 79, temperature 98.4 F (36.9 C), temperature source Oral, resp. rate 16, height 6' (1.829 m), weight 185.975 kg (410 lb), SpO2 94.00%. General appearance: alert, cooperative, appears stated age and morbidly obese Head: Normocephalic, without obvious abnormality, atraumatic Eyes: conjunctivae/corneas clear. PERRL, EOM's intact. Fundi benign. Ears: normal TM's and external ear canals both ears Nose: Nares normal. Septum midline. Mucosa normal. No drainage or sinus tenderness. Throat: lips, mucosa, and tongue normal; teeth and gums  normal Neck: no adenopathy, no carotid bruit, no JVD, supple, symmetrical, trachea midline and thyroid not enlarged, symmetric, no tenderness/mass/nodules Back: symmetric, no curvature. ROM normal. No CVA tenderness. 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 No rashes or lesions Lymph nodes: Cervical, supraclavicular, and axillary nodes normal. Neurologic: Grossly normal Incision/Wound: Left nephroureteral stent c/d/i with scan drainage in ostomy appliance around. Midline abd wound c/d/e with dry dressing as is port-a-cath removal site.   Disposition: 01-Home or Self Care   Future Appointments Provider Department Dept Phone   02/01/2013 10:00 AM [facility] 1 [facility] CT IMAGING [phone]   Patient to arrive 15 minutes prior to appointment time. Patient to pick up oral contrast at least 1 day prior to exam, unless otherwise instructed by your physician. No solid food 4 hours prior to exam. Liquids and Medicines are okay.   02/01/2013 10:30 AM [facility] 1 [facility] CT IMAGING [phone]   Patient to arrive 15 minutes prior to appointment time.   02/08/2013 11:30 AM Rachael Fee [facility] [place] [phone]   02/08/2013 12:00 PM Josph Macho, MD [facility] [place] [phone]       Medication List         carvedilol 6.25 MG tablet  Commonly known as:  COREG  Take 6.25 mg by mouth 2 (two) times daily with a meal.     metFORMIN 1000 MG tablet  Commonly known as:  GLUCOPHAGE  Take 1,000 mg by mouth daily with breakfast.     oxyCODONE-acetaminophen 5-325 MG per tablet  Commonly known  as:  ROXICET  Take 1 tablet by mouth every 4 (four) hours as needed for pain. Postoperatively     pioglitazone-metformin 15-850 MG per tablet  Commonly known as:  ACTOPLUS MET  Take 1 tablet by mouth 2 (two) times daily with a meal.     ramipril 10 MG capsule  Commonly known as:  ALTACE  Take 10 mg by mouth daily before breakfast.     rosuvastatin 20 MG tablet  Commonly known as:  CRESTOR  Take 20 mg by mouth daily before breakfast.     senna-docusate 8.6-50 MG per tablet  Commonly known as:  Senokot-S  Take 1 tablet by mouth 2 (two) times daily. While taking pain meds to prevent constipation     sertraline 100 MG tablet  Commonly known as:  ZOLOFT  Take 150 mg by mouth daily before breakfast.     sulfamethoxazole-trimethoprim 800-160 MG per tablet  Commonly known as:  BACTRIM DS  Take 1 tablet by mouth 2 (two) times daily. Begin day before next Urology appointment     TRADJENTA 5 MG Tabs tablet  Generic drug:  linagliptin  Take by mouth daily.           Follow-up Information   Follow up with Sebastian Ache, MD. (We will call you to set up office appointment in about 1-2 weeks. )    Specialty:  Urology   Contact information:   [address] [place] [address] [phone]       Signed: Sebastian Ache 01/11/2013, 11:38 AM

## 2013-01-20 ENCOUNTER — Telehealth: Payer: Self-pay | Admitting: Hematology & Oncology

## 2013-01-20 ENCOUNTER — Encounter (INDEPENDENT_AMBULATORY_CARE_PROVIDER_SITE_OTHER): Payer: Self-pay | Admitting: Surgery

## 2013-01-20 ENCOUNTER — Ambulatory Visit (INDEPENDENT_AMBULATORY_CARE_PROVIDER_SITE_OTHER): Payer: 59 | Admitting: Surgery

## 2013-01-20 VITALS — BP 138/84 | HR 90 | Temp 97.4°F | Resp 18 | Ht 72.0 in | Wt >= 6400 oz

## 2013-01-20 DIAGNOSIS — Z09 Encounter for follow-up examination after completed treatment for conditions other than malignant neoplasm: Secondary | ICD-10-CM

## 2013-01-20 NOTE — Progress Notes (Signed)
Subjective:     Patient ID: Brandon Schaefer, male   DOB: Jun 27, 1955, 57 y.o.   MRN: [id number]  HPI He is here for his postoperative visit status post Port-A-Cath removal as well as excision of his chronic abdominal wound. I did this during his hospital admission for surgery for his complicated kidney stone. He reports that he started having drainage from his wound yesterday  Review of Systems     Objective:   Physical Exam On exam, the port site is well healed. The abdominal wound has opened up. I removed the sutures. The wound is not appear deep as before    Assessment:     Patient stable postop     Plan:     I will have him start placing Neosporin on the open wound and place a dry bandage over this. He will do this daily. I will see him back in 2 weeks

## 2013-01-20 NOTE — Telephone Encounter (Signed)
Pt wants to reschedule 10-20 CT and 10-27 MD due to surgery. Pt is seeing surgeon next week and will call for new dates. Dr Myna Hidalgo aware

## 2013-01-26 ENCOUNTER — Telehealth: Payer: Self-pay | Admitting: Hematology & Oncology

## 2013-01-26 NOTE — Telephone Encounter (Signed)
Patient called and cx ct scan that was sch on 02/01/13.  i called central scheduling and spoke with Tiffany and cx patient's 02/01/13 ct scan.  Patient states he will call back to resch in November.

## 2013-02-01 ENCOUNTER — Other Ambulatory Visit (HOSPITAL_COMMUNITY): Payer: 59

## 2013-02-03 ENCOUNTER — Ambulatory Visit (INDEPENDENT_AMBULATORY_CARE_PROVIDER_SITE_OTHER): Payer: 59 | Admitting: Surgery

## 2013-02-03 ENCOUNTER — Encounter (INDEPENDENT_AMBULATORY_CARE_PROVIDER_SITE_OTHER): Payer: Self-pay | Admitting: Surgery

## 2013-02-03 VITALS — BP 146/86 | HR 86 | Temp 97.4°F | Resp 18 | Ht 72.0 in | Wt >= 6400 oz

## 2013-02-03 DIAGNOSIS — Z09 Encounter for follow-up examination after completed treatment for conditions other than malignant neoplasm: Secondary | ICD-10-CM

## 2013-02-03 NOTE — Progress Notes (Signed)
Subjective:     Patient ID: Brandon Schaefer, male   DOB: 18-Jan-1956, 57 y.o.   MRN: [id number]  HPI He is here for another postop check. He is still having some mild drainage from the open wound. His urologist removed his final drains  Review of Systems     Objective:   Physical Exam On exam, the small open wound on the abdominal midline shows some mild fibrinous exudate which I removed the Q-tip    Assessment:     Patient stable postop     Plan:     He will continue to wash the wound daily with soap and water and we'll cover with a dry bandage. I will see him back in 3 weeks

## 2013-02-08 ENCOUNTER — Other Ambulatory Visit (HOSPITAL_BASED_OUTPATIENT_CLINIC_OR_DEPARTMENT_OTHER): Payer: 59 | Admitting: Lab

## 2013-02-08 ENCOUNTER — Ambulatory Visit (HOSPITAL_BASED_OUTPATIENT_CLINIC_OR_DEPARTMENT_OTHER): Payer: 59 | Admitting: Hematology & Oncology

## 2013-02-08 VITALS — BP 136/56 | HR 91 | Temp 98.7°F | Resp 18 | Ht 72.0 in | Wt >= 6400 oz

## 2013-02-08 DIAGNOSIS — C187 Malignant neoplasm of sigmoid colon: Secondary | ICD-10-CM

## 2013-02-08 LAB — CBC WITH DIFFERENTIAL (CANCER CENTER ONLY)
BASO%: 0.4 % (ref 0.0–2.0)
EOS%: 3.7 % (ref 0.0–7.0)
HCT: 35.9 % — ABNORMAL LOW (ref 38.7–49.9)
LYMPH#: 1.9 10*3/uL (ref 0.9–3.3)
LYMPH%: 18.5 % (ref 14.0–48.0)
MCH: 26.4 pg — ABNORMAL LOW (ref 28.0–33.4)
MCHC: 32.6 g/dL (ref 32.0–35.9)
MCV: 81 fL — ABNORMAL LOW (ref 82–98)
MONO%: 7.1 % (ref 0.0–13.0)
NEUT%: 70.3 % (ref 40.0–80.0)
Platelets: 233 10*3/uL (ref 145–400)
RDW: 14.6 % (ref 11.1–15.7)

## 2013-02-08 NOTE — Progress Notes (Signed)
This office note has been dictated.

## 2013-02-09 LAB — COMPREHENSIVE METABOLIC PANEL
ALT: 20 U/L (ref 0–53)
AST: 19 U/L (ref 0–37)
Albumin: 3.9 g/dL (ref 3.5–5.2)
Alkaline Phosphatase: 82 U/L (ref 39–117)
CO2: 21 mEq/L (ref 19–32)
Potassium: 4.1 mEq/L (ref 3.5–5.3)
Sodium: 139 mEq/L (ref 135–145)
Total Bilirubin: 0.4 mg/dL (ref 0.3–1.2)
Total Protein: 6.5 g/dL (ref 6.0–8.3)

## 2013-02-09 NOTE — Progress Notes (Signed)
CC:   [name], M.D. Sebastian Ache, MD [name], M.D.  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  CURRENT THERAPY:  Observation.  INTERIM HISTORY:  Brandon Schaefer comes in for followup.  Again, he completed his adjuvant chemotherapy with infusional 5-FU every 2 weeks back in May.  He recently was in the hospital for 8 days after having kidney stones taken out from the left side.  This apparently was a quite difficult procedure.  He had quite a bit of stones.  While he was in the hospital, he also had his laparotomy incision worked on by Dr. Magnus Ivan.  Brandon Schaefer finally had his stents removed.  Apparently, he went back to work today.  He is working full time, which is surprising.  He is a very tough guy.  He has had no cough or shortness of breath.  There has been no change in leg swelling.  He has had no fever.  He has had no rashes.  His last CEA was 1.3 back in July.  He was supposed to have a CT scan done for restaging.  However, with his recent hospitalization, we decided to hold off on that until next year.  PHYSICAL EXAMINATION:  General:  This is a morbidly obese white gentleman in no obvious distress.  Schaefer signs:  Temperature of 98.7, pulse 91, respiratory rate 18, blood pressure 136/56.  Weight is 407 pounds.  Head and neck exam:  Shows normocephalic, atraumatic skull. There are no ocular or oral lesions.  There are no palpable cervical or supraclavicular lymph nodes.  Lungs:  Clear bilaterally.  He has no rales, wheezes, or rhonchi.  Cardiac:  Regular rate and rhythm with a normal S1 and S2.  There are no murmurs, rubs or bruits.  Abdomen: Soft.  He has a dressing in the hypo-umbilical region from his recent surgery.  He has a laparotomy, lower laparoscopy scar in the left flank that is healed.  He has no palpable hepatosplenomegaly.  There is no ascites.  Extremities:  Show no clubbing or cyanosis.  There is  chronic nonpitting edema in the lower legs.  He has good strength in his arms and legs.  Neurological:  No focal neurological deficits.  LABORATORY STUDIES:  White cell count is 10.4, hemoglobin 11.7, hematocrit 35.9 and platelet count 233.  IMPRESSION:  Brandon Schaefer is a very nice 57 year old gentleman with high risk stage II colon cancer.  He has microsatellite stability.  As such, we went ahead and gave him adjuvant chemotherapy with 2 weeks of 12 cycles of infusional 5-FU.  He completed this back in May.  We will go ahead and get him set up with a CT scan in January.  Of note, he had his Port-A-Cath removed while he was in the hospital.  I will see him back after his CAT scan is done.    ______________________________ Brandon Schaefer, M.D. PRE/MEDQ  D:  02/08/2013  T:  02/09/2013  Job:  [id number]

## 2013-02-18 ENCOUNTER — Other Ambulatory Visit: Payer: Self-pay

## 2013-03-01 ENCOUNTER — Encounter (INDEPENDENT_AMBULATORY_CARE_PROVIDER_SITE_OTHER): Payer: Self-pay | Admitting: Surgery

## 2013-03-01 ENCOUNTER — Ambulatory Visit (INDEPENDENT_AMBULATORY_CARE_PROVIDER_SITE_OTHER): Payer: 59 | Admitting: Surgery

## 2013-03-01 VITALS — BP 125/86 | HR 68 | Temp 97.7°F | Resp 16 | Ht 72.0 in | Wt >= 6400 oz

## 2013-03-01 DIAGNOSIS — Z09 Encounter for follow-up examination after completed treatment for conditions other than malignant neoplasm: Secondary | ICD-10-CM

## 2013-03-01 NOTE — Progress Notes (Signed)
Subjective:     Patient ID: Brandon Schaefer, male   DOB: [birth year], 57 y.o.   MRN: [id number]  HPI He is here for another visit. He continues to improve and has no complaints.  Review of Systems     Objective:   Physical Exam On exam, the wound is and was completely closed on his abdomen.    Assessment:     Patient stable postop     Plan:     He will now dressed with a dry dressing and I will see him back in one month. I told him to suspect intermittent drainage

## 2013-03-31 ENCOUNTER — Ambulatory Visit (INDEPENDENT_AMBULATORY_CARE_PROVIDER_SITE_OTHER): Payer: BC Managed Care – PPO | Admitting: Surgery

## 2013-03-31 ENCOUNTER — Encounter (INDEPENDENT_AMBULATORY_CARE_PROVIDER_SITE_OTHER): Payer: 59 | Admitting: Surgery

## 2013-03-31 ENCOUNTER — Encounter (INDEPENDENT_AMBULATORY_CARE_PROVIDER_SITE_OTHER): Payer: Self-pay | Admitting: Surgery

## 2013-03-31 VITALS — BP 138/90 | HR 110 | Temp 98.0°F | Resp 18 | Ht 72.0 in | Wt >= 6400 oz

## 2013-03-31 DIAGNOSIS — Z09 Encounter for follow-up examination after completed treatment for conditions other than malignant neoplasm: Secondary | ICD-10-CM

## 2013-03-31 NOTE — Progress Notes (Signed)
Subjective:     Patient ID: Brandon Schaefer, male   DOB: [birth year], 57 y.o.   MRN: [id number]  HPI He is here for his final postoperative visit. He is doing well and has no complaints. He has had no drainage from the wound  Review of Systems     Objective:   Physical Exam His abdominal incision is completely healed    Assessment:     Patient did well postop     Plan:     I will see him back as needed

## 2013-04-22 ENCOUNTER — Encounter (HOSPITAL_COMMUNITY): Payer: Self-pay

## 2013-04-22 ENCOUNTER — Ambulatory Visit (HOSPITAL_COMMUNITY)
Admission: RE | Admit: 2013-04-22 | Discharge: 2013-04-22 | Disposition: A | Discharge status: Home / Self Care | Payer: BC Managed Care – PPO | Source: Ambulatory Visit | Attending: Hematology & Oncology | Admitting: Hematology & Oncology

## 2013-04-22 DIAGNOSIS — C187 Malignant neoplasm of sigmoid colon: Secondary | ICD-10-CM

## 2013-04-22 DIAGNOSIS — Z9221 Personal history of antineoplastic chemotherapy: Secondary | ICD-10-CM | POA: Insufficient documentation

## 2013-04-22 DIAGNOSIS — R918 Other nonspecific abnormal finding of lung field: Secondary | ICD-10-CM | POA: Insufficient documentation

## 2013-04-22 DIAGNOSIS — C189 Malignant neoplasm of colon, unspecified: Secondary | ICD-10-CM | POA: Insufficient documentation

## 2013-04-22 MED ORDER — IOHEXOL 300 MG/ML  SOLN
125.0000 mL | Freq: Once | INTRAMUSCULAR | Status: AC | PRN
  Administered 2013-04-22: 125 mL via INTRAVENOUS

## 2013-04-22 MED ORDER — IOHEXOL 300 MG/ML  SOLN
100.0000 mL | Freq: Once | INTRAMUSCULAR | Status: DC | PRN
Start: 2013-04-22 — End: 2013-04-22

## 2013-04-27 ENCOUNTER — Telehealth: Payer: Self-pay | Admitting: Nurse Practitioner

## 2013-04-27 NOTE — Telephone Encounter (Addendum)
Message copied by Jimmy Footman on Tue Apr 27, 2013  4:50 PM ------      Message from: Burney Gauze R      Created: Thu Apr 22, 2013  6:45 PM       Call - no cancer!!  Laurey Arrow ------Pt verbalized understanding and appreciation.

## 2013-04-29 ENCOUNTER — Ambulatory Visit (HOSPITAL_BASED_OUTPATIENT_CLINIC_OR_DEPARTMENT_OTHER): Payer: BC Managed Care – PPO | Admitting: Hematology & Oncology

## 2013-04-29 ENCOUNTER — Other Ambulatory Visit (HOSPITAL_BASED_OUTPATIENT_CLINIC_OR_DEPARTMENT_OTHER): Payer: BC Managed Care – PPO | Admitting: Lab

## 2013-04-29 ENCOUNTER — Encounter: Payer: Self-pay | Admitting: Hematology & Oncology

## 2013-04-29 VITALS — BP 137/49 | HR 100 | Temp 98.3°F | Resp 20 | Ht 72.0 in | Wt >= 6400 oz

## 2013-04-29 DIAGNOSIS — Z85038 Personal history of other malignant neoplasm of large intestine: Secondary | ICD-10-CM

## 2013-04-29 DIAGNOSIS — C187 Malignant neoplasm of sigmoid colon: Secondary | ICD-10-CM

## 2013-04-29 LAB — CBC WITH DIFFERENTIAL (CANCER CENTER ONLY)
BASO#: 0 10*3/uL (ref 0.0–0.2)
BASO%: 0.4 % (ref 0.0–2.0)
EOS ABS: 0.3 10*3/uL (ref 0.0–0.5)
EOS%: 3.6 % (ref 0.0–7.0)
HCT: 38.8 % (ref 38.7–49.9)
HEMOGLOBIN: 12.8 g/dL — AB (ref 13.0–17.1)
LYMPH#: 2 10*3/uL (ref 0.9–3.3)
LYMPH%: 21.9 % (ref 14.0–48.0)
MCH: 25.9 pg — AB (ref 28.0–33.4)
MCHC: 33 g/dL (ref 32.0–35.9)
MCV: 78 fL — AB (ref 82–98)
MONO#: 0.6 10*3/uL (ref 0.1–0.9)
MONO%: 7 % (ref 0.0–13.0)
NEUT#: 6.1 10*3/uL (ref 1.5–6.5)
NEUT%: 67.1 % (ref 40.0–80.0)
Platelets: 268 10*3/uL (ref 145–400)
RBC: 4.95 10*6/uL (ref 4.20–5.70)
RDW: 14.4 % (ref 11.1–15.7)
WBC: 9.2 10*3/uL (ref 4.0–10.0)

## 2013-04-29 LAB — CEA: CEA: 1.7 ng/mL (ref 0.0–5.0)

## 2013-04-29 LAB — CMP (CANCER CENTER ONLY)
ALT: 24 U/L (ref 10–47)
AST: 20 U/L (ref 11–38)
Albumin: 3.4 g/dL (ref 3.3–5.5)
Alkaline Phosphatase: 98 U/L — ABNORMAL HIGH (ref 26–84)
BUN, Bld: 14 mg/dL (ref 7–22)
CALCIUM: 9.1 mg/dL (ref 8.0–10.3)
CHLORIDE: 102 meq/L (ref 98–108)
CO2: 31 meq/L (ref 18–33)
Creat: 0.5 mg/dl — ABNORMAL LOW (ref 0.6–1.2)
Glucose, Bld: 241 mg/dL — ABNORMAL HIGH (ref 73–118)
Potassium: 4.6 mEq/L (ref 3.3–4.7)
SODIUM: 141 meq/L (ref 128–145)
TOTAL PROTEIN: 7.2 g/dL (ref 6.4–8.1)
Total Bilirubin: 0.4 mg/dl (ref 0.20–1.60)

## 2013-04-29 NOTE — Progress Notes (Signed)
This office note has been dictated.

## 2013-04-30 NOTE — Progress Notes (Signed)
CC:   Brandon Schaefer, M.D. Brandon Frock, MD Brandon Schaefer, M.D.  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  CURRENT THERAPY:  Observation.  INTERIM HISTORY:  Brandon Schaefer comes in for followup.  He is doing fairly well.  We went ahead and repeated his CT scan.  This was done on January 8th.  The CT scan did not show any evidence of recurrent colon cancer.  He has been having a lot of urology issues.  He did undergo a left stone and right kidney stone removal.  He had a stent placed.  He has recovered from this fairly well.  He is trying to work.  He works as much as he can.  He hurt his right knee.  He twisted a little bit.  He is not wanting to see anybody about this.  His blood sugars are a little on the higher side.  He has had no cough.  He has had no nausea or vomiting.  His new laparotomy wound finally has healed up.  PHYSICAL EXAMINATION:  General:  This is a morbidly obese white gentleman in no obvious distress.  Vital Signs:  Temperature of 98.3, pulse 100, respiratory rate 20, blood pressure 137/49.  Weight is 415 pounds.  Head and Neck:  Normocephalic, atraumatic skull.  There are no ocular or oral lesions.  There are no palpable cervical or supraclavicular lymph nodes.  Lungs:  Clear bilaterally.  Cardiac: Regular rate and rhythm with a normal S1, S2.  There are no murmurs, rubs, or bruits.  Abdomen:  Soft.  He is morbidly obese.  There is no obvious fluid wave.  There is no palpable abdominal mass.  His laparotomy scar has healed nicely.  Extremities:  Show some chronic stasis dermatitis changes in his lower legs.  He has decent range of motion of his joints.  There may be some slight swelling in the right knee.  LABORATORY STUDIES:  White cell count is 9.2, hemoglobin 12.8, hematocrit 38.8, platelet count 268.  Glucose is 241.  IMPRESSION:  Brandon Schaefer is a  58 year old gentleman with history of stage II adenocarcinoma of the sigmoid colon.  He is doing well post adjuvant therapy.  We gave him FOLFOX.  We then switched him over to infusional 5-FU after about 3 cycles.  He tolerated this well.  He completed adjuvant chemotherapy back in May, 2014.  We will go ahead and plan for another CT scan in about 5 months or so. I think this would be reasonable amount of time for followup.  I will see him back after his CT scan is done.    ______________________________ [name], M.D. PRE/MEDQ  D:  04/29/2013  T:  04/30/2013  Job:  [id number]

## 2013-09-14 ENCOUNTER — Ambulatory Visit (HOSPITAL_COMMUNITY)
Admission: RE | Admit: 2013-09-14 | Discharge: 2013-09-14 | Disposition: A | Discharge status: Home / Self Care | Payer: BC Managed Care – PPO | Source: Ambulatory Visit | Attending: Hematology & Oncology | Admitting: Hematology & Oncology

## 2013-09-14 ENCOUNTER — Other Ambulatory Visit (HOSPITAL_BASED_OUTPATIENT_CLINIC_OR_DEPARTMENT_OTHER): Payer: BC Managed Care – PPO

## 2013-09-14 ENCOUNTER — Encounter (HOSPITAL_COMMUNITY): Payer: Self-pay

## 2013-09-14 ENCOUNTER — Ambulatory Visit (HOSPITAL_COMMUNITY): Payer: BC Managed Care – PPO

## 2013-09-14 DIAGNOSIS — C187 Malignant neoplasm of sigmoid colon: Secondary | ICD-10-CM | POA: Insufficient documentation

## 2013-09-14 LAB — CBC WITH DIFFERENTIAL/PLATELET
BASO%: 0.7 % (ref 0.0–2.0)
Basophils Absolute: 0.1 10*3/uL (ref 0.0–0.1)
EOS%: 2.7 % (ref 0.0–7.0)
Eosinophils Absolute: 0.2 10*3/uL (ref 0.0–0.5)
HCT: 40.3 % (ref 38.4–49.9)
HEMOGLOBIN: 13.3 g/dL (ref 13.0–17.1)
LYMPH#: 1.5 10*3/uL (ref 0.9–3.3)
LYMPH%: 17.1 % (ref 14.0–49.0)
MCH: 26.7 pg — ABNORMAL LOW (ref 27.2–33.4)
MCHC: 33.1 g/dL (ref 32.0–36.0)
MCV: 80.5 fL (ref 79.3–98.0)
MONO#: 0.5 10*3/uL (ref 0.1–0.9)
MONO%: 6 % (ref 0.0–14.0)
NEUT#: 6.6 10*3/uL — ABNORMAL HIGH (ref 1.5–6.5)
NEUT%: 73.5 % (ref 39.0–75.0)
PLATELETS: 255 10*3/uL (ref 140–400)
RBC: 5 10*6/uL (ref 4.20–5.82)
RDW: 14.8 % — ABNORMAL HIGH (ref 11.0–14.6)
WBC: 9 10*3/uL (ref 4.0–10.3)

## 2013-09-14 LAB — COMPREHENSIVE METABOLIC PANEL (CC13)
ALK PHOS: 82 U/L (ref 40–150)
ALT: 24 U/L (ref 0–55)
ANION GAP: 14 meq/L — AB (ref 3–11)
AST: 20 U/L (ref 5–34)
Albumin: 3.5 g/dL (ref 3.5–5.0)
BILIRUBIN TOTAL: 0.35 mg/dL (ref 0.20–1.20)
BUN: 12.8 mg/dL (ref 7.0–26.0)
CO2: 17 meq/L — AB (ref 22–29)
CREATININE: 0.8 mg/dL (ref 0.7–1.3)
Calcium: 8.9 mg/dL (ref 8.4–10.4)
Chloride: 106 mEq/L (ref 98–109)
Glucose: 240 mg/dl — ABNORMAL HIGH (ref 70–140)
Potassium: 4.3 mEq/L (ref 3.5–5.1)
SODIUM: 138 meq/L (ref 136–145)
Total Protein: 6.6 g/dL (ref 6.4–8.3)

## 2013-09-14 MED ORDER — IOHEXOL 300 MG/ML  SOLN
125.0000 mL | Freq: Once | INTRAMUSCULAR | Status: AC | PRN
  Administered 2013-09-14: 125 mL via INTRAVENOUS

## 2013-09-15 ENCOUNTER — Encounter: Payer: Self-pay | Admitting: *Deleted

## 2013-09-15 LAB — CEA: CEA: 2 ng/mL (ref 0.0–5.0)

## 2013-09-21 ENCOUNTER — Ambulatory Visit (HOSPITAL_BASED_OUTPATIENT_CLINIC_OR_DEPARTMENT_OTHER): Payer: BC Managed Care – PPO | Admitting: Hematology & Oncology

## 2013-09-21 ENCOUNTER — Encounter: Payer: Self-pay | Admitting: Hematology & Oncology

## 2013-09-21 VITALS — BP 142/48 | HR 94 | Temp 98.4°F | Resp 20 | Ht 71.0 in | Wt >= 6400 oz

## 2013-09-21 DIAGNOSIS — D509 Iron deficiency anemia, unspecified: Secondary | ICD-10-CM

## 2013-09-21 DIAGNOSIS — C187 Malignant neoplasm of sigmoid colon: Secondary | ICD-10-CM

## 2013-09-21 NOTE — Progress Notes (Signed)
Hematology and Oncology Follow Up Visit  Brandon Schaefer [id number] [birth year] 58 y.o. 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  Current Therapy:   Observation.     Interim History:  Mr.  Schaefer is back for followup. He's doing okay. He still working. His blood sugars have been a low on the high side because he cannot take his metformin the past couple days because the CT scan.  The CAT scan did not show any recurrent disease.  He lost his mother for couple months ago. This has been a little cough on him.  He's had no cough. He's had no abdominal pain. There's been no change in bowel or bladder habits. He's had no worsening of leg swelling. He's had no rashes.  His CEA was 2.0 when checked last week.  Medications: Current outpatient prescriptions:carvedilol (COREG) 6.25 MG tablet, Take 6.25 mg by mouth 2 (two) times daily with a meal., Disp: , Rfl: ;  gabapentin (NEURONTIN) 400 MG capsule, Take 400 mg by mouth 3 (three) times daily. , Disp: , Rfl: ;  glimepiride (AMARYL) 2 MG tablet, Take 2 mg by mouth daily with breakfast. , Disp: , Rfl: ;  metFORMIN (GLUCOPHAGE) 1000 MG tablet, Take 1,000 mg by mouth daily with breakfast. , Disp: , Rfl:  oxyCODONE-acetaminophen (ROXICET) 5-325 MG per tablet, Take 1 tablet by mouth every 4 (four) hours as needed for pain. Postoperatively, Disp: 40 tablet, Rfl: 0;  pioglitazone-metformin (ACTOPLUS MET) 15-850 MG per tablet, Take 1 tablet by mouth 2 (two) times daily with a meal., Disp: , Rfl: ;  potassium citrate (UROCIT-K) 10 MEQ (1080 MG) SR tablet, Take 10 mEq by mouth 2 (two) times daily. , Disp: , Rfl:  ramipril (ALTACE) 10 MG capsule, Take 10 mg by mouth daily before breakfast., Disp: , Rfl: ;  rosuvastatin (CRESTOR) 20 MG tablet, Take 20 mg by mouth daily before breakfast., Disp: , Rfl: ;  senna-docusate (SENOKOT-S) 8.6-50 MG per tablet, Take  1 tablet by mouth 2 (two) times daily. While taking pain meds to prevent constipation, Disp: 30 tablet, Rfl: 0;  sertraline (ZOLOFT) 100 MG tablet, Take 150 mg by mouth daily before breakfast., Disp: , Rfl:  TRADJENTA 5 MG TABS tablet, Take by mouth daily. , Disp: , Rfl:  No current facility-administered medications for this visit. Facility-Administered Medications Ordered in Other Visits: sodium chloride 0.9 % injection 10 mL, 10 mL, Intracatheter, PRN, [name], MD  Allergies:  Allergies  Allergen Reactions  . Adhesive [Tape]     Past Medical History, Surgical history, Social history, and Family History were reviewed and updated.  Review of Systems: As above  Physical Exam:  height is _0  (1.803 m) and weight is 408 lb (185.068 kg). His oral temperature is 98.4 F (36.9 C). His blood pressure is 142/48 and his pulse is 94. His respiration is 20.   Morbidly obese white gentleman. There are no ocular or oral lesions. He has no palpable cervical or supraclavicular lymph nodes. Lungs are clear bilaterally. He has a slight decrease at the bases. Cardiac exam regular rate and rhythm with no murmurs rubs or bruits. Abdomen is soft. She has good bowel sounds. There is no fluid wave. His laparotomy wound is healed. There is no palpable liver or spleen. Extremities shows no clubbing cyanosis. He does have chronic 1+ edema in his legs. Skin exam shows some edema and stasis dermatitis  changes in his legs. Neurological exam is nonfocal.  Lab Results  Component Value Date   WBC 9.0 09/14/2013   HGB 13.3 09/14/2013   HCT 40.3 09/14/2013   MCV 80.5 09/14/2013   PLT 255 09/14/2013     Chemistry      Component Value Date/Time   NA 138 09/14/2013 1027   NA 141 04/29/2013 0949   NA 139 02/08/2013 1140   K 4.3 09/14/2013 1027   K 4.6 04/29/2013 0949   K 4.1 02/08/2013 1140   CL 102 04/29/2013 0949   CL 105 02/08/2013 1140   CO2 17* 09/14/2013 1027   CO2 31 04/29/2013 0949   CO2 21 02/08/2013 1140    BUN 12.8 09/14/2013 1027   BUN 14 04/29/2013 0949   BUN 10 02/08/2013 1140   CREATININE 0.8 09/14/2013 1027   CREATININE 0.5* 04/29/2013 0949   CREATININE 0.81 02/08/2013 1140      Component Value Date/Time   CALCIUM 8.9 09/14/2013 1027   CALCIUM 9.1 04/29/2013 0949   CALCIUM 8.9 02/08/2013 1140   ALKPHOS 82 09/14/2013 1027   ALKPHOS 98* 04/29/2013 0949   ALKPHOS 82 02/08/2013 1140   AST 20 09/14/2013 1027   AST 20 04/29/2013 0949   AST 19 02/08/2013 1140   ALT 24 09/14/2013 1027   ALT 24 04/29/2013 0949   ALT 20 02/08/2013 1140   BILITOT 0.35 09/14/2013 1027   BILITOT 0.40 04/29/2013 0949   BILITOT 0.4 02/08/2013 1140         Impression and Plan: Brandon Schaefer is 58 year old gentleman with a history of a stage II colon cancer. I felt that he was at risk for recurrence because of his genetic. He had microsatellite stability which would increase the risk of recurrence for stage II colon cancer.  He completed his chemotherapy about a year ago.  I think we will probably hold off on CT scans now. It is suffering to do scans. I think if we see any changes with his labs or with his physical exam, then we can do scans.  I'll plan to get him back in 4 months.   [name], MD 6/9/201511:13 AM

## 2014-01-19 ENCOUNTER — Telehealth: Payer: Self-pay | Admitting: Hematology & Oncology

## 2014-01-19 NOTE — Telephone Encounter (Signed)
Pt moved 10-9 to 10-29 he is sick

## 2014-01-21 ENCOUNTER — Other Ambulatory Visit: Payer: BC Managed Care – PPO | Admitting: Lab

## 2014-01-21 ENCOUNTER — Ambulatory Visit: Payer: BC Managed Care – PPO | Admitting: Hematology & Oncology

## 2014-02-10 ENCOUNTER — Encounter: Payer: Self-pay | Admitting: Family

## 2014-02-10 ENCOUNTER — Other Ambulatory Visit (HOSPITAL_BASED_OUTPATIENT_CLINIC_OR_DEPARTMENT_OTHER): Payer: BC Managed Care – PPO | Admitting: Lab

## 2014-02-10 ENCOUNTER — Ambulatory Visit (HOSPITAL_BASED_OUTPATIENT_CLINIC_OR_DEPARTMENT_OTHER): Payer: BC Managed Care – PPO | Admitting: Family

## 2014-02-10 VITALS — BP 138/66 | HR 90 | Temp 98.4°F | Resp 20 | Ht 70.0 in | Wt >= 6400 oz

## 2014-02-10 DIAGNOSIS — C187 Malignant neoplasm of sigmoid colon: Secondary | ICD-10-CM

## 2014-02-10 DIAGNOSIS — Z85030 Personal history of malignant carcinoid tumor of large intestine: Secondary | ICD-10-CM

## 2014-02-10 DIAGNOSIS — D509 Iron deficiency anemia, unspecified: Secondary | ICD-10-CM

## 2014-02-10 LAB — CBC WITH DIFFERENTIAL (CANCER CENTER ONLY)
BASO#: 0.1 10*3/uL (ref 0.0–0.2)
BASO%: 0.7 % (ref 0.0–2.0)
EOS ABS: 0.4 10*3/uL (ref 0.0–0.5)
EOS%: 4.3 % (ref 0.0–7.0)
HCT: 38.8 % (ref 38.7–49.9)
HGB: 13.6 g/dL (ref 13.0–17.1)
LYMPH#: 1.7 10*3/uL (ref 0.9–3.3)
LYMPH%: 19.3 % (ref 14.0–48.0)
MCH: 28.5 pg (ref 28.0–33.4)
MCHC: 35.1 g/dL (ref 32.0–35.9)
MCV: 81 fL — ABNORMAL LOW (ref 82–98)
MONO#: 0.5 10*3/uL (ref 0.1–0.9)
MONO%: 6.2 % (ref 0.0–13.0)
NEUT%: 69.5 % (ref 40.0–80.0)
NEUTROS ABS: 6 10*3/uL (ref 1.5–6.5)
PLATELETS: 280 10*3/uL (ref 145–400)
RBC: 4.78 10*6/uL (ref 4.20–5.70)
RDW: 13.3 % (ref 11.1–15.7)
WBC: 8.6 10*3/uL (ref 4.0–10.0)

## 2014-02-10 LAB — CMP (CANCER CENTER ONLY)
ALT(SGPT): 30 U/L (ref 10–47)
AST: 22 U/L (ref 11–38)
Albumin: 3.4 g/dL (ref 3.3–5.5)
Alkaline Phosphatase: 72 U/L (ref 26–84)
BILIRUBIN TOTAL: 0.6 mg/dL (ref 0.20–1.60)
BUN, Bld: 11 mg/dL (ref 7–22)
CALCIUM: 9 mg/dL (ref 8.0–10.3)
CHLORIDE: 101 meq/L (ref 98–108)
CO2: 23 meq/L (ref 18–33)
Creat: 0.8 mg/dl (ref 0.6–1.2)
Glucose, Bld: 217 mg/dL — ABNORMAL HIGH (ref 73–118)
Potassium: 3.8 mEq/L (ref 3.3–4.7)
SODIUM: 141 meq/L (ref 128–145)
TOTAL PROTEIN: 6.5 g/dL (ref 6.4–8.1)

## 2014-02-10 LAB — IRON AND TIBC CHCC
%SAT: 17 % — ABNORMAL LOW (ref 20–55)
Iron: 60 ug/dL (ref 42–163)
TIBC: 356 ug/dL (ref 202–409)
UIBC: 296 ug/dL (ref 117–376)

## 2014-02-10 LAB — TECHNOLOGIST REVIEW CHCC SATELLITE

## 2014-02-10 LAB — FERRITIN CHCC: FERRITIN: 78 ng/mL (ref 22–316)

## 2014-02-10 NOTE — Progress Notes (Signed)
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  INTERVAL HISTORY: Brandon Schaefer is here today for a follow-up. He is doing very well. He is back on his metformin and says that his sugars are doing much better at hoome. The wound he had on his lower abdomen has completely healed. He denies fever, chills, n/v, cough, rash, headache, dizziness, SOB, chest pain, palpitations, abdominal pain, constipation, diarrhea, blood in urine or stool. No swelling, tenderness, numbness or tingling in his extremities. He has had no bleeding or pain. His CEA in June was 2.0. His appetite is good and he is staying hydrated.   CURRENT TREATMENT: Observation  REVIEW OF SYSTEMS: All other 10 point review of systems is negative.   PAST MEDICAL HISTORY: Past Medical History  Diagnosis Date  . Hypertension   . Diabetes mellitus   . Allergy   . Hyperlipidemia   . Anemia   . Anxiety   . Shortness of breath     with exertion  . Headache(784.0)   . Neuromuscular disorder     peripheral neuropathy  . History of blood transfusion   . colon ca dx'd 11/2011   PAST SURGICAL HISTORY: Past Surgical History  Procedure Laterality Date  . Fracture surgery       ORIF-left radius has pin  . Pilonidal cyst excision    . Partial colectomy  01/17/2012    Procedure: PARTIAL COLECTOMY;  Surgeon: Harl Bowie, MD;  Location: [facility];  Service: General;;  . Proctoscopy  01/17/2012    Procedure: PROCTOSCOPY;  Surgeon: Harl Bowie, MD;  Location: [facility];  Service: General;;  . Portacath placement  03/19/2012    Procedure: INSERTION PORT-A-CATH;  Surgeon: Harl Bowie, MD;  Location: [facility];  Service:  General;  Laterality: N/A;  . Nephrolithotomy Left 01/04/2013    Procedure: NEPHROLITHOTOMY PERCUTANEOUS  LEFT 1ST STAGE PERCUTANEOUS NEPHROSTOLITHOTOMY;  Surgeon: Alexis Frock, MD;  Location: [facility];  Service: Urology;  Laterality: Left;  . Cystoscopy w/ ureteral stent placement Bilateral 01/04/2013    Procedure: CYSTOSCOPY WITH RETROGRADE PYELOGRAM/Right double J URETERAL STENT PLACEMENT;  Surgeon: Alexis Frock, MD;  Location: [facility];  Service: Urology;  Laterality: Bilateral;  . Holmium laser application N/A 3/42/8768    Procedure: HOLMIUM LASER APPLICATION;  Surgeon: Alexis Frock, MD;  Location: [facility];  Service: Urology;  Laterality: N/A;  . Nephrolithotomy Left 01/06/2013    Procedure: NEPHROLITHOTOMY PERCUTANEOUS SECOND LOOK;  Surgeon: Alexis Frock, MD;  Location: [facility];  Service: Urology;  Laterality: Left;  . Cystoscopy with retrograde pyelogram, ureteroscopy and stent placement Right 01/06/2013    Procedure: CYSTOSCOPY WITH RIGHT RETROGRADE PYELOGRAM, URETEROSCOPY AND BILATERAL STENT EXCHANGE, BILATERAL STONE BASKET EXTRACTION ;  Surgeon: Alexis Frock, MD;  Location: [facility];  Service: Urology;  Laterality: Right;  . Port-a-cath removal Left 01/06/2013    Procedure: REMOVAL PORT-A-CATH;  Surgeon: Harl Bowie, MD;  Location: [facility];  Service: General;  Laterality: Left;  . Complex wound closure N/A 01/06/2013    Procedure: EXCISION CHRONIC ABDOMINAL WOUND;  Surgeon: Harl Bowie, MD;  Location: [facility];  Service: General;  Laterality: N/A;   FAMILY HISTORY Family History  Problem Relation Age of Onset  . Diabetes Father   .  Cancer Maternal Aunt     colon  . Cancer Maternal Grandmother     colon   GYNECOLOGIC HISTORY:  No LMP for male patient.   SOCIAL HISTORY: History   Social History  . Marital Status: Single    Spouse Name: N/A    Number of Children: N/A  . Years of Education: N/A   Occupational History  . Not on file.   Social History Main Topics   . Smoking status: Never Smoker   . Smokeless tobacco: Never Used  . Alcohol Use: No  . Drug Use: No  . Sexual Activity: Not on file   Other Topics Concern  . Not on file   Social History Narrative  . No narrative on file   ADVANCED DIRECTIVES:  <no information>  HEALTH MAINTENANCE: History  Substance Use Topics  . Smoking status: Never Smoker   . Smokeless tobacco: Never Used  . Alcohol Use: No   Colonoscopy: PAP: Bone density: Lipid panel:  Allergies  Allergen Reactions  . Adhesive [Tape]     Current Outpatient Prescriptions  Medication Sig Dispense Refill  . carvedilol (COREG) 6.25 MG tablet Take 6.25 mg by mouth 2 (two) times daily with a meal.      . glimepiride (AMARYL) 2 MG tablet Take 2 mg by mouth daily with breakfast.       . insulin glargine (LANTUS) 100 UNIT/ML injection Inject 50 Units into the skin at bedtime.      . metFORMIN (GLUCOPHAGE) 1000 MG tablet Take 1,000 mg by mouth daily with breakfast.       . pioglitazone-metformin (ACTOPLUS MET) 15-850 MG per tablet Take 1 tablet by mouth 2 (two) times daily with a meal.      . potassium citrate (UROCIT-K) 10 MEQ (1080 MG) SR tablet Take 10 mEq by mouth 2 (two) times daily.       . ramipril (ALTACE) 10 MG capsule Take 10 mg by mouth daily before breakfast.      . rosuvastatin (CRESTOR) 20 MG tablet Take 20 mg by mouth daily before breakfast.      . senna-docusate (SENOKOT-S) 8.6-50 MG per tablet Take 1 tablet by mouth 2 (two) times daily. While taking pain meds to prevent constipation  30 tablet  0  . sertraline (ZOLOFT) 100 MG tablet Take 150 mg by mouth daily before breakfast.      . TRADJENTA 5 MG TABS tablet Take by mouth daily.        No current facility-administered medications for this visit.   Facility-Administered Medications Ordered in Other Visits  Medication Dose Route Frequency Provider Last Rate Last Dose  . sodium chloride 0.9 % injection 10 mL  10 mL Intracatheter PRN [name], MD        OBJECTIVE: Filed Vitals:   02/10/14 0845  BP: 138/66  Pulse: 90  Temp: 98.4 F (36.9 C)  Resp: 20    Filed Weights   02/10/14 0845  Weight: 408 lb (185.068 kg)   ECOG FS:0 - Asymptomatic Ocular: Sclerae unicteric, pupils equal, round and reactive to light Ear-nose-throat: Oropharynx clear, dentition fair Lymphatic: No cervical or supraclavicular adenopathy Lungs no rales or rhonchi, good excursion bilaterally Heart regular rate and rhythm, no murmur appreciated Abd soft, nontender, positive bowel sounds MSK no focal spinal tenderness, no joint edema Neuro: non-focal, well-oriented, appropriate affect  LAB RESULTS: CMP     Component Value Date/Time   NA 138 09/14/2013 1027   NA 141 04/29/2013 0949  NA 139 02/08/2013 1140   K 4.3 09/14/2013 1027   K 4.6 04/29/2013 0949   K 4.1 02/08/2013 1140   CL 102 04/29/2013 0949   CL 105 02/08/2013 1140   CO2 17* 09/14/2013 1027   CO2 31 04/29/2013 0949   CO2 21 02/08/2013 1140   GLUCOSE 240* 09/14/2013 1027   GLUCOSE 241* 04/29/2013 0949   GLUCOSE 162* 02/08/2013 1140   BUN 12.8 09/14/2013 1027   BUN 14 04/29/2013 0949   BUN 10 02/08/2013 1140   CREATININE 0.8 09/14/2013 1027   CREATININE 0.5* 04/29/2013 0949   CREATININE 0.81 02/08/2013 1140   CALCIUM 8.9 09/14/2013 1027   CALCIUM 9.1 04/29/2013 0949   CALCIUM 8.9 02/08/2013 1140   PROT 6.6 09/14/2013 1027   PROT 7.2 04/29/2013 0949   PROT 6.5 02/08/2013 1140   ALBUMIN 3.5 09/14/2013 1027   ALBUMIN 3.9 02/08/2013 1140   AST 20 09/14/2013 1027   AST 20 04/29/2013 0949   AST 19 02/08/2013 1140   ALT 24 09/14/2013 1027   ALT 24 04/29/2013 0949   ALT 20 02/08/2013 1140   ALKPHOS 82 09/14/2013 1027   ALKPHOS 98* 04/29/2013 0949   ALKPHOS 82 02/08/2013 1140   BILITOT 0.35 09/14/2013 1027   BILITOT 0.40 04/29/2013 0949   BILITOT 0.4 02/08/2013 1140   GFRNONAA >90 01/07/2013 0425   GFRAA >90 01/07/2013 0425   No results found for this basename: SPEP, UPEP,  kappa and lambda light chains   Lab  Results  Component Value Date   WBC 8.6 02/10/2014   NEUTROABS 6.0 02/10/2014   HGB 13.6 02/10/2014   HCT 38.8 02/10/2014   MCV 81* 02/10/2014   PLT 280 02/10/2014   No results found for this basename: LABCA2   No components found with this basename: LABCA125   No results found for this basename: INR,  in the last 168 hours Urinalysis No results found for this basename: colorurine, appearanceur, labspec, phurine, glucoseu, hgbur, bilirubinur, ketonesur, proteinur, urobilinogen, nitrite, leukocytesur   STUDIES: No results found.  ASSESSMENT/PLAN: Mr. Dority is 58 year old gentleman with a history of a stage II colon cancer. His last CEA was 2.0. He is doing well and is asymptomatic at this time. There has been no evidence of recurrance.  He completed his chemotherapy a year ago.  We will wait and see what his iron studies and CEA from today show.  We will see him back in 4 months for labs and follow-up.  He knows to call here with any questions or concerns and to go to the ED in the event of an emergency. We can certainly see him back sooner if need be.    Eliezer Bottom, NP 02/10/2014 8:52 AM

## 2014-02-11 ENCOUNTER — Encounter: Payer: Self-pay | Admitting: *Deleted

## 2014-02-11 LAB — CEA: CEA: 1.4 ng/mL (ref 0.0–5.0)

## 2014-05-18 ENCOUNTER — Other Ambulatory Visit: Payer: Self-pay | Admitting: Family

## 2014-06-16 ENCOUNTER — Other Ambulatory Visit (HOSPITAL_BASED_OUTPATIENT_CLINIC_OR_DEPARTMENT_OTHER): Payer: BLUE CROSS/BLUE SHIELD | Admitting: Lab

## 2014-06-16 ENCOUNTER — Encounter: Payer: Self-pay | Admitting: Hematology & Oncology

## 2014-06-16 ENCOUNTER — Telehealth: Payer: Self-pay | Admitting: Hematology & Oncology

## 2014-06-16 ENCOUNTER — Ambulatory Visit (HOSPITAL_BASED_OUTPATIENT_CLINIC_OR_DEPARTMENT_OTHER): Payer: BLUE CROSS/BLUE SHIELD | Admitting: Hematology & Oncology

## 2014-06-16 VITALS — BP 142/65 | HR 104 | Temp 97.8°F | Resp 20 | Ht 69.0 in | Wt >= 6400 oz

## 2014-06-16 DIAGNOSIS — C44609 Unspecified malignant neoplasm of skin of left upper limb, including shoulder: Secondary | ICD-10-CM

## 2014-06-16 DIAGNOSIS — Z85038 Personal history of other malignant neoplasm of large intestine: Secondary | ICD-10-CM

## 2014-06-16 DIAGNOSIS — C187 Malignant neoplasm of sigmoid colon: Secondary | ICD-10-CM

## 2014-06-16 LAB — CBC WITH DIFFERENTIAL (CANCER CENTER ONLY)
BASO#: 0 10*3/uL (ref 0.0–0.2)
BASO%: 0.3 % (ref 0.0–2.0)
EOS ABS: 0.3 10*3/uL (ref 0.0–0.5)
EOS%: 3.3 % (ref 0.0–7.0)
HCT: 39 % (ref 38.7–49.9)
HEMOGLOBIN: 13.4 g/dL (ref 13.0–17.1)
LYMPH#: 1.7 10*3/uL (ref 0.9–3.3)
LYMPH%: 18.9 % (ref 14.0–48.0)
MCH: 28 pg (ref 28.0–33.4)
MCHC: 34.4 g/dL (ref 32.0–35.9)
MCV: 81 fL — AB (ref 82–98)
MONO#: 0.7 10*3/uL (ref 0.1–0.9)
MONO%: 7.3 % (ref 0.0–13.0)
NEUT#: 6.3 10*3/uL (ref 1.5–6.5)
NEUT%: 70.2 % (ref 40.0–80.0)
Platelets: 256 10*3/uL (ref 145–400)
RBC: 4.79 10*6/uL (ref 4.20–5.70)
RDW: 13.4 % (ref 11.1–15.7)
WBC: 9 10*3/uL (ref 4.0–10.0)

## 2014-06-16 LAB — IRON AND TIBC CHCC
%SAT: 17 % — AB (ref 20–55)
IRON: 58 ug/dL (ref 42–163)
TIBC: 336 ug/dL (ref 202–409)
UIBC: 279 ug/dL (ref 117–376)

## 2014-06-16 LAB — CMP (CANCER CENTER ONLY)
ALBUMIN: 3.5 g/dL (ref 3.3–5.5)
ALT: 26 U/L (ref 10–47)
AST: 19 U/L (ref 11–38)
Alkaline Phosphatase: 99 U/L — ABNORMAL HIGH (ref 26–84)
BILIRUBIN TOTAL: 0.6 mg/dL (ref 0.20–1.60)
BUN, Bld: 12 mg/dL (ref 7–22)
CO2: 27 mEq/L (ref 18–33)
Calcium: 9.2 mg/dL (ref 8.0–10.3)
Chloride: 98 mEq/L (ref 98–108)
Creat: 0.9 mg/dl (ref 0.6–1.2)
GLUCOSE: 313 mg/dL — AB (ref 73–118)
POTASSIUM: 4.4 meq/L (ref 3.3–4.7)
SODIUM: 143 meq/L (ref 128–145)
TOTAL PROTEIN: 7 g/dL (ref 6.4–8.1)

## 2014-06-16 LAB — FERRITIN CHCC: FERRITIN: 85 ng/mL (ref 22–316)

## 2014-06-16 LAB — CEA: CEA: 1.9 ng/mL (ref 0.0–5.0)

## 2014-06-16 NOTE — Progress Notes (Signed)
Hematology and Oncology Follow Up Visit  Brandon Schaefer [id number] 10/30/1955 59 y.o. 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  Current Therapy:   Observation.     Interim History:  Mr.  Schaefer is back for followup. He's doing okay. He still working. K from work today. He works at a [employer].   The CAT scan did not show any recurrent disease.  He's had no problems with fever. He's had no bleeding. He's had no headache. He's had no rashes.  He He's had no cough. He's had no abdominal pain. There's been no change in bowel or bladder habits. He's had no worsening of leg swelling. He's had no rashes.  His CEA was 1.4 when checked with his last visit.  Medications:  Current outpatient prescriptions:  .  carvedilol (COREG) 6.25 MG tablet, Take 6.25 mg by mouth 2 (two) times daily with a meal., Disp: , Rfl:  .  glimepiride (AMARYL) 2 MG tablet, Take 2 mg by mouth daily with breakfast. , Disp: , Rfl:  .  insulin glargine (LANTUS) 100 UNIT/ML injection, Inject 50 Units into the skin at bedtime., Disp: , Rfl:  .  metFORMIN (GLUCOPHAGE) 1000 MG tablet, Take 1,000 mg by mouth daily with breakfast. , Disp: , Rfl:  .  pioglitazone-metformin (ACTOPLUS MET) 15-850 MG per tablet, Take 1 tablet by mouth 2 (two) times daily with a meal., Disp: , Rfl:  .  potassium citrate (UROCIT-K) 10 MEQ (1080 MG) SR tablet, Take 10 mEq by mouth 2 (two) times daily. , Disp: , Rfl:  .  ramipril (ALTACE) 10 MG capsule, Take 10 mg by mouth daily before breakfast., Disp: , Rfl:  .  rosuvastatin (CRESTOR) 20 MG tablet, Take 20 mg by mouth daily before breakfast., Disp: , Rfl:  .  senna-docusate (SENOKOT-S) 8.6-50 MG per tablet, Take 1 tablet by mouth 2 (two) times daily. While taking pain meds to prevent constipation, Disp: 30 tablet, Rfl: 0 .  sertraline (ZOLOFT) 100 MG tablet, Take 150 mg by  mouth daily before breakfast., Disp: , Rfl:  .  TRADJENTA 5 MG TABS tablet, Take by mouth daily. , Disp: , Rfl:   Allergies:  Allergies  Allergen Reactions  . Adhesive [Tape]     Past Medical History, Surgical history, Social history, and Family History were reviewed and updated.  Review of Systems: As above  Physical Exam:  height is '5\' 9"'  (1.753 m) and weight is 400 lb (181.439 kg). His oral temperature is 97.8 F (36.6 C). His blood pressure is 142/65 and his pulse is 104. His respiration is 20.   Morbidly obese white gentleman. There are no ocular or oral lesions. He has no palpable cervical or supraclavicular lymph nodes. Lungs are clear bilaterally. He has a slight decrease at the bases. Cardiac exam regular rate and rhythm with no murmurs rubs or bruits. Abdomen is soft. She has good bowel sounds. There is no fluid wave. His laparotomy wound is healed. There is no palpable liver or spleen. Extremities shows no clubbing cyanosis. He does have chronic 1+ edema in his legs. Skin exam shows some edema and stasis dermatitis changes in his legs. Neurological exam is nonfocal.  Lab Results  Component Value Date   WBC 9.0 06/16/2014   HGB 13.4 06/16/2014   HCT 39.0 06/16/2014   MCV 81* 06/16/2014   PLT 256 06/16/2014  Chemistry      Component Value Date/Time   NA 143 06/16/2014 0821   NA 138 09/14/2013 1027   NA 139 02/08/2013 1140   K 4.4 06/16/2014 0821   K 4.3 09/14/2013 1027   K 4.1 02/08/2013 1140   CL 98 06/16/2014 0821   CL 105 02/08/2013 1140   CO2 27 06/16/2014 0821   CO2 17* 09/14/2013 1027   CO2 21 02/08/2013 1140   BUN 12 06/16/2014 0821   BUN 12.8 09/14/2013 1027   BUN 10 02/08/2013 1140   CREATININE 0.9 06/16/2014 0821   CREATININE 0.8 09/14/2013 1027   CREATININE 0.81 02/08/2013 1140      Component Value Date/Time   CALCIUM 9.2 06/16/2014 0821   CALCIUM 8.9 09/14/2013 1027   CALCIUM 8.9 02/08/2013 1140   ALKPHOS 99* 06/16/2014 0821   ALKPHOS 82  09/14/2013 1027   ALKPHOS 82 02/08/2013 1140   AST 19 06/16/2014 0821   AST 20 09/14/2013 1027   AST 19 02/08/2013 1140   ALT 26 06/16/2014 0821   ALT 24 09/14/2013 1027   ALT 20 02/08/2013 1140   BILITOT 0.60 06/16/2014 0821   BILITOT 0.35 09/14/2013 1027   BILITOT 0.4 02/08/2013 1140         Impression and Plan: Brandon Schaefer is 59 year old gentleman with a history of a stage II colon cancer. I felt that he was at risk for recurrence because of his genetic. He had microsatellite stability which would increase the risk of recurrence for stage II colon cancer.  He completed his chemotherapy in June 2014.  I do not see any evidence of recurrent disease. I didn't do not see a need for a count of scans.  We will plan to get him back in 6 months.  [name], MD 3/3/201610:38 AM

## 2014-06-16 NOTE — Telephone Encounter (Signed)
Schedule 3-22

## 2014-06-17 ENCOUNTER — Encounter: Payer: Self-pay | Admitting: *Deleted

## 2014-07-05 ENCOUNTER — Other Ambulatory Visit: Payer: Self-pay | Admitting: Dermatology

## 2014-07-12 ENCOUNTER — Encounter: Payer: Self-pay | Admitting: Hematology & Oncology

## 2014-12-22 ENCOUNTER — Encounter: Payer: Self-pay | Admitting: Hematology & Oncology

## 2014-12-22 ENCOUNTER — Ambulatory Visit (HOSPITAL_BASED_OUTPATIENT_CLINIC_OR_DEPARTMENT_OTHER): Payer: BLUE CROSS/BLUE SHIELD | Admitting: Hematology & Oncology

## 2014-12-22 ENCOUNTER — Other Ambulatory Visit (HOSPITAL_BASED_OUTPATIENT_CLINIC_OR_DEPARTMENT_OTHER): Payer: BLUE CROSS/BLUE SHIELD

## 2014-12-22 ENCOUNTER — Ambulatory Visit (HOSPITAL_BASED_OUTPATIENT_CLINIC_OR_DEPARTMENT_OTHER): Payer: BLUE CROSS/BLUE SHIELD

## 2014-12-22 VITALS — BP 145/72 | HR 103 | Temp 98.1°F | Resp 18 | Ht 69.0 in | Wt >= 6400 oz

## 2014-12-22 DIAGNOSIS — C44609 Unspecified malignant neoplasm of skin of left upper limb, including shoulder: Secondary | ICD-10-CM

## 2014-12-22 DIAGNOSIS — C187 Malignant neoplasm of sigmoid colon: Secondary | ICD-10-CM | POA: Diagnosis not present

## 2014-12-22 DIAGNOSIS — Z6841 Body Mass Index (BMI) 40.0 and over, adult: Secondary | ICD-10-CM

## 2014-12-22 DIAGNOSIS — Z0000 Encounter for general adult medical examination without abnormal findings: Secondary | ICD-10-CM | POA: Diagnosis not present

## 2014-12-22 DIAGNOSIS — Z23 Encounter for immunization: Secondary | ICD-10-CM | POA: Diagnosis not present

## 2014-12-22 DIAGNOSIS — Z85038 Personal history of other malignant neoplasm of large intestine: Secondary | ICD-10-CM | POA: Diagnosis not present

## 2014-12-22 LAB — CMP (CANCER CENTER ONLY)
ALT(SGPT): 27 U/L (ref 10–47)
AST: 23 U/L (ref 11–38)
Albumin: 3.5 g/dL (ref 3.3–5.5)
Alkaline Phosphatase: 99 U/L — ABNORMAL HIGH (ref 26–84)
BUN, Bld: 14 mg/dL (ref 7–22)
CALCIUM: 9 mg/dL (ref 8.0–10.3)
CHLORIDE: 98 meq/L (ref 98–108)
CO2: 25 meq/L (ref 18–33)
Creat: 1 mg/dl (ref 0.6–1.2)
Glucose, Bld: 314 mg/dL — ABNORMAL HIGH (ref 73–118)
Potassium: 4.5 mEq/L (ref 3.3–4.7)
SODIUM: 137 meq/L (ref 128–145)
Total Bilirubin: 0.8 mg/dl (ref 0.20–1.60)
Total Protein: 6.7 g/dL (ref 6.4–8.1)

## 2014-12-22 LAB — CBC WITH DIFFERENTIAL (CANCER CENTER ONLY)
BASO#: 0 10*3/uL (ref 0.0–0.2)
BASO%: 0.4 % (ref 0.0–2.0)
EOS%: 4.6 % (ref 0.0–7.0)
Eosinophils Absolute: 0.4 10*3/uL (ref 0.0–0.5)
HEMATOCRIT: 39 % (ref 38.7–49.9)
HGB: 13.4 g/dL (ref 13.0–17.1)
LYMPH#: 1.8 10*3/uL (ref 0.9–3.3)
LYMPH%: 19 % (ref 14.0–48.0)
MCH: 27.9 pg — ABNORMAL LOW (ref 28.0–33.4)
MCHC: 34.4 g/dL (ref 32.0–35.9)
MCV: 81 fL — ABNORMAL LOW (ref 82–98)
MONO#: 0.6 10*3/uL (ref 0.1–0.9)
MONO%: 6.7 % (ref 0.0–13.0)
NEUT#: 6.6 10*3/uL — ABNORMAL HIGH (ref 1.5–6.5)
NEUT%: 69.3 % (ref 40.0–80.0)
Platelets: 272 10*3/uL (ref 145–400)
RBC: 4.81 10*6/uL (ref 4.20–5.70)
RDW: 13.9 % (ref 11.1–15.7)
WBC: 9.6 10*3/uL (ref 4.0–10.0)

## 2014-12-22 MED ORDER — INFLUENZA VAC SPLIT QUAD 0.5 ML IM SUSY
0.5000 mL | PREFILLED_SYRINGE | Freq: Once | INTRAMUSCULAR | Status: AC
  Administered 2014-12-22: 0.5 mL via INTRAMUSCULAR
  Filled 2014-12-22: qty 0.5

## 2014-12-22 NOTE — Progress Notes (Signed)
Hematology and Oncology Follow Up Visit  Brandon Schaefer [id number] October 21, 1955 59 y.o. 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  Current Therapy:   Observation.     Interim History:  Mr.  Schaefer is back for followup. He's doing okay. He still working. He is a very busy at work.  He had a good summer. He worked quite a bit during the summertime. He did go on vacation a little bit.  Unfortunately, he did lose his family doctor. He is seen a new doctor today. It is Dr. Carlis Abbott. Data Carlis Abbott is very very good. He will help Brandon Schaefer with his diabetes  He has a little bit of constipation right now. I told her to try some olive oil. This does not work, some milk of magnesia might help.  He's had no cough. He's had no rashes. There is has issues with leg swelling.  His blood sugars have been on the high side.[name]  His CEA was 1.9 when checked with his last visit.  Medications:  Current outpatient prescriptions:  .  carvedilol (COREG) 6.25 MG tablet, Take 6.25 mg by mouth 2 (two) times daily with a meal., Disp: , Rfl:  .  glimepiride (AMARYL) 2 MG tablet, Take 2 mg by mouth daily with breakfast. , Disp: , Rfl:  .  insulin glargine (LANTUS) 100 UNIT/ML injection, Inject 50 Units into the skin at bedtime., Disp: , Rfl:  .  metFORMIN (GLUCOPHAGE) 1000 MG tablet, Take 1,000 mg by mouth daily with breakfast. , Disp: , Rfl:  .  pioglitazone-metformin (ACTOPLUS MET) 15-850 MG per tablet, Take 1 tablet by mouth 2 (two) times daily with a meal., Disp: , Rfl:  .  potassium citrate (UROCIT-K) 10 MEQ (1080 MG) SR tablet, Take 10 mEq by mouth 2 (two) times daily. , Disp: , Rfl:  .  ramipril (ALTACE) 10 MG capsule, Take 10 mg by mouth daily before breakfast., Disp: , Rfl:  .  rosuvastatin (CRESTOR) 20 MG tablet, Take 20 mg by mouth daily before breakfast., Disp: , Rfl:  .  senna-docusate  (SENOKOT-S) 8.6-50 MG per tablet, Take 1 tablet by mouth 2 (two) times daily. While taking pain meds to prevent constipation, Disp: 30 tablet, Rfl: 0 .  sertraline (ZOLOFT) 100 MG tablet, Take 150 mg by mouth daily before breakfast., Disp: , Rfl:  .  TRADJENTA 5 MG TABS tablet, Take by mouth daily. , Disp: , Rfl:   Current facility-administered medications:  .  Influenza vac split quadrivalent PF (FLUARIX) injection 0.5 mL, 0.5 mL, Intramuscular, Once, Brandon Napoleon, MD  Allergies:  Allergies  Allergen Reactions  . Adhesive [Tape]     Past Medical History, Surgical history, Social history, and Family History were reviewed and updated.  Review of Systems: As above  Physical Exam:  vitals were not taken for this visit.  Morbidly obese white gentleman. There are no ocular or oral lesions. He has no palpable cervical or supraclavicular lymph nodes. Lungs are clear bilaterally. He has a slight decrease at the bases. Cardiac exam regular rate and rhythm with no murmurs rubs or bruits. Abdomen is soft. She has good bowel sounds. There is no fluid wave. His laparotomy wound is healed. There is no palpable liver or spleen. Extremities shows no clubbing cyanosis. He does have chronic 1+ edema in his legs. Skin exam shows some edema and stasis dermatitis changes in his legs. Neurological exam  is nonfocal.  Lab Results  Component Value Date   WBC 9.0 06/16/2014   HGB 13.4 06/16/2014   HCT 39.0 06/16/2014   MCV 81* 06/16/2014   PLT 256 06/16/2014     Chemistry      Component Value Date/Time   NA 143 06/16/2014 0821   NA 138 09/14/2013 1027   NA 139 02/08/2013 1140   K 4.4 06/16/2014 0821   K 4.3 09/14/2013 1027   K 4.1 02/08/2013 1140   CL 98 06/16/2014 0821   CL 105 02/08/2013 1140   CO2 27 06/16/2014 0821   CO2 17* 09/14/2013 1027   CO2 21 02/08/2013 1140   BUN 12 06/16/2014 0821   BUN 12.8 09/14/2013 1027   BUN 10 02/08/2013 1140   CREATININE 0.9 06/16/2014 0821   CREATININE  0.8 09/14/2013 1027   CREATININE 0.81 02/08/2013 1140      Component Value Date/Time   CALCIUM 9.2 06/16/2014 0821   CALCIUM 8.9 09/14/2013 1027   CALCIUM 8.9 02/08/2013 1140   ALKPHOS 99* 06/16/2014 0821   ALKPHOS 82 09/14/2013 1027   ALKPHOS 82 02/08/2013 1140   AST 19 06/16/2014 0821   AST 20 09/14/2013 1027   AST 19 02/08/2013 1140   ALT 26 06/16/2014 0821   ALT 24 09/14/2013 1027   ALT 20 02/08/2013 1140   BILITOT 0.60 06/16/2014 0821   BILITOT 0.35 09/14/2013 1027   BILITOT 0.4 02/08/2013 1140         Impression and Plan: Mr. Thiam is 59 year old gentleman with a history of a stage II colon cancer. I felt that he was at risk for recurrence because of his presentation. He had microsatellite stability which would increase the risk of recurrence for stage II colon cancer.  He completed his chemotherapy in June 2014.  I do not see any evidence of recurrent disease. I do not see a need for a count of scans.  We will plan to get him back in 6 months.  Brandon Napoleon, MD 9/8/20168:17 AM

## 2014-12-22 NOTE — Patient Instructions (Signed)

## 2014-12-23 LAB — CEA: CEA: 1.6 ng/mL (ref 0.0–5.0)

## 2015-03-20 ENCOUNTER — Encounter: Payer: Self-pay | Admitting: Internal Medicine

## 2015-03-20 ENCOUNTER — Ambulatory Visit (INDEPENDENT_AMBULATORY_CARE_PROVIDER_SITE_OTHER): Payer: 59 | Admitting: Internal Medicine

## 2015-03-20 VITALS — BP 130/84 | HR 103 | Temp 98.3°F | Resp 18 | Wt >= 6400 oz

## 2015-03-20 DIAGNOSIS — M549 Dorsalgia, unspecified: Secondary | ICD-10-CM

## 2015-03-20 DIAGNOSIS — G629 Polyneuropathy, unspecified: Secondary | ICD-10-CM

## 2015-03-20 DIAGNOSIS — R21 Rash and other nonspecific skin eruption: Secondary | ICD-10-CM | POA: Diagnosis not present

## 2015-03-20 DIAGNOSIS — M170 Bilateral primary osteoarthritis of knee: Secondary | ICD-10-CM

## 2015-03-20 DIAGNOSIS — E119 Type 2 diabetes mellitus without complications: Secondary | ICD-10-CM

## 2015-03-20 DIAGNOSIS — Z794 Long term (current) use of insulin: Secondary | ICD-10-CM

## 2015-03-20 DIAGNOSIS — C187 Malignant neoplasm of sigmoid colon: Secondary | ICD-10-CM | POA: Diagnosis not present

## 2015-03-20 DIAGNOSIS — I10 Essential (primary) hypertension: Secondary | ICD-10-CM

## 2015-03-20 DIAGNOSIS — G64 Other disorders of peripheral nervous system: Secondary | ICD-10-CM

## 2015-03-20 MED ORDER — CLOBETASOL PROPIONATE 0.05 % EX SOLN: 1.0000 "application " | Freq: Two times a day (BID) | CUTANEOUS | Status: DC

## 2015-03-20 MED ORDER — MELOXICAM 15 MG PO TABS: 15.0000 mg | ORAL_TABLET | Freq: Every day | ORAL | Status: DC

## 2015-03-20 MED ORDER — ROSUVASTATIN CALCIUM 20 MG PO TABS: 20.0000 mg | ORAL_TABLET | Freq: Every day | ORAL | Status: DC

## 2015-03-20 NOTE — Assessment & Plan Note (Signed)
Following with endocrine He is unsure when his last A1c was, but his endocrinologist told him it was very good Continue current medications He will make an eye appointment Following with podiatry for foot exams and Neuropathy secondary to chemotherapy Continue weight loss efforts Encouraged regular exercise

## 2015-03-20 NOTE — Progress Notes (Signed)
Subjective:    Patient ID: Brandon Schaefer, male    DOB: 1955/07/10, 59 y.o.   MRN: [id number]  HPI He is here to establish with a new primary care physician. He has a few concerns.  Spots/infection bilateral lower legs: For 3 years he has had spots on both lower legs. He has been prescribed antibiotics in the past and it has helped. His diabetic doctor recently prescribed antibiotics-he is unsure which one and he just finished them. This did improve the lesions, but there is still there. In the past after finishing antibiotics they will get worse. He denies any pain or itching.  Rash on right leg: .For approximately 2 weeks he has had a rash on his right medial lower leg. This is itchy and it has gotten larger. He has cats but denies any new pats. He denies any new products or any exposures that may have caused the rash. He did apply some cortisone cream to help with the itching and it did help. He is unsure if it helped with the rash.    Diabetes: He is taking his medication daily as prescribed. He is compliant with a diabetic diet. He is not exercising regularly. He has lost weight  He follows with endocrine (Dr Jeanann Lewandowsky) and [facility]) He is not up-to-date with an ophthalmology examination.   He follows with urology for history of kidney stones.  He goes to pain management clinic for bilateral knee arthritis and back pain. He has had knee injections in the past and they have not helped. He still has pain even with the pain medications. He wonders if he could take anti-inflammatory.  Colon cancer: He sees oncology.  He was diagnosed with  stage two colon cancer - s/surgery-it was all removed.  He then had chemo 5 tmes a month x 7 months.  He completed chemo in June 2014.  Side effect from chemo - huge kidney stones.  Had operations to remove stones.  Other side effects: lost taste, numbness in feet.  Hypertension: He is taking his medication daily. He is compliant  with a low sodium diet.  He denies chest pain, palpitations, shortness of breath and lightheadedness. He is not exercising regularly.  He does not monitor his blood pressure at home.       Medications and allergies reviewed with patient and updated if appropriate.  Patient Active Problem List   Diagnosis Date Noted  . Anemia, iron deficiency 05/05/2012  . Postoperative wound infection 02/25/2012  . DM (diabetes mellitus) ([place]) 01/18/2012  . HTN (hypertension) 01/18/2012  . Morbid obesity with body mass index of 60.0-69.9 in adult ([place]) 01/17/2012  . Respiratory failure, post-operative ([place]) 01/17/2012  . Cancer of sigmoid colon ([place]) 12/23/2011    Current Outpatient Prescriptions on File Prior to Visit  Medication Sig Dispense Refill  . carvedilol (COREG) 6.25 MG tablet Take 6.25 mg by mouth 2 (two) times daily with a meal.    . HORIZANT 600 MG TBCR   4  . metFORMIN (GLUCOPHAGE) 1000 MG tablet Take 1,000 mg by mouth daily with breakfast.     . NUCYNTA ER 150 MG TB12 TK 1 T PO Q 12 H  0  . oxyCODONE (ROXICODONE) 15 MG immediate release tablet TK 1 T PO FOUR TO FIVE TIMES A DAY  0  . potassium citrate (UROCIT-K) 10 MEQ (1080 MG) SR tablet Take 10 mEq by mouth 2 (two) times daily.     . ramipril (ALTACE) 10  MG capsule Take 10 mg by mouth daily before breakfast.    . senna-docusate (SENOKOT-S) 8.6-50 MG per tablet Take 1 tablet by mouth 2 (two) times daily. While taking pain meds to prevent constipation 30 tablet 0  . sertraline (ZOLOFT) 100 MG tablet Take 150 mg by mouth daily before breakfast.    . TRADJENTA 5 MG TABS tablet Take by mouth daily.      No current facility-administered medications on file prior to visit.    Past Medical History  Diagnosis Date  . Hypertension   . Diabetes mellitus   . Allergy   . Hyperlipidemia   . Anemia   . Anxiety   . Shortness of breath     with exertion  . Headache(784.0)   . Neuromuscular disorder     peripheral neuropathy  . History of  blood transfusion   . colon ca dx'd 11/2011    Past Surgical History  Procedure Laterality Date  . Fracture surgery       ORIF-left radius has pin  . Pilonidal cyst excision    . Partial colectomy  01/17/2012    Procedure: PARTIAL COLECTOMY;  Surgeon: Harl Bowie, MD;  [facility];  Service: General;;  . Proctoscopy  01/17/2012    Procedure: PROCTOSCOPY;  Surgeon: Harl Bowie, MD;  [facility];  Service: General;;  . Portacath placement  03/19/2012    Procedure: INSERTION PORT-A-CATH;  Surgeon: Harl Bowie, MD;  Location: [facility];  Service: General;  Laterality: N/A;  . Nephrolithotomy Left 01/04/2013    Procedure: NEPHROLITHOTOMY PERCUTANEOUS  LEFT 1ST STAGE PERCUTANEOUS NEPHROSTOLITHOTOMY;  Surgeon: Alexis Frock, MD;  [facility];  Service: Urology;  Laterality: Left;  . Cystoscopy w/ ureteral stent placement Bilateral 01/04/2013    Procedure: CYSTOSCOPY WITH RETROGRADE PYELOGRAM/Right double J URETERAL STENT PLACEMENT;  Surgeon: Alexis Frock, MD;  [facility];  Service: Urology;  Laterality: Bilateral;  . Holmium laser application N/A [id number]    Procedure: HOLMIUM LASER APPLICATION;  Surgeon: Alexis Frock, MD;  [facility];  Service: Urology;  Laterality: N/A;  . Nephrolithotomy Left 01/06/2013    Procedure: NEPHROLITHOTOMY PERCUTANEOUS SECOND LOOK;  Surgeon: Alexis Frock, MD;  [facility];  Service: Urology;  Laterality: Left;  . Cystoscopy with retrograde pyelogram, ureteroscopy and stent placement Right 01/06/2013    Procedure: CYSTOSCOPY WITH RIGHT RETROGRADE PYELOGRAM, URETEROSCOPY AND BILATERAL STENT EXCHANGE, BILATERAL STONE BASKET EXTRACTION ;  Surgeon: Alexis Frock, MD;  [facility];  Service: Urology;  Laterality: Right;  . Port-a-cath removal Left 01/06/2013    Procedure: REMOVAL PORT-A-CATH;  Surgeon: Harl Bowie, MD;  [facility];  Service: General;  Laterality: Left;  . Complex wound  closure N/A 01/06/2013    Procedure: EXCISION CHRONIC ABDOMINAL WOUND;  Surgeon: Harl Bowie, MD;  [facility];  Service: General;  Laterality: N/A;    Social History   Social History  . Marital Status: Single    Spouse Name: N/A  . Number of Children: N/A  . Years of Education: N/A   Social History Main Topics  . Smoking status: Never Smoker   . Smokeless tobacco: Never Used     Comment: NEVER USED TOBACC0  . Alcohol Use: No  . Drug Use: No  . Sexual Activity: Not on file   Other Topics Concern  . Not on file   Social History Narrative    Review of Systems  Constitutional: Negative for fever, chills and appetite  change.  HENT: Negative for hearing loss.   Eyes: Negative for visual disturbance.  Respiratory: Negative for cough, shortness of breath and wheezing.   Cardiovascular: Positive for leg swelling (b/l feet and ankles). Negative for chest pain and palpitations.  Gastrointestinal: Positive for diarrhea. Negative for nausea, abdominal pain and blood in stool.       Rare GERD  Musculoskeletal: Positive for back pain and arthralgias.  Skin: Positive for rash.  Neurological: Positive for headaches (frontal ). Negative for dizziness and light-headedness.  Psychiatric/Behavioral: Negative for dysphoric mood. The patient is not nervous/anxious.        Objective:   Filed Vitals:   03/20/15 0850  BP: 130/84  Pulse: 103  Temp: 98.3 F (36.8 C)  Resp: 18   Filed Weights   03/20/15 0850  Weight: 403 lb (182.8 kg)   Body mass index is 59.49 kg/(m^2).   Physical Exam  Constitutional: No distress.  Morbidly obese  HENT:  Head: Normocephalic and atraumatic.  Right Ear: External ear normal.  Left Ear: External ear normal.  Bilateral ear canals and tympanic membranes normal  Eyes: Conjunctivae are normal.  Neck: Neck supple. No tracheal deviation present. No thyromegaly present.  No carotid bruit bilaterally  Cardiovascular: Normal rate, regular  rhythm and normal heart sounds.   No murmur heard. Pulmonary/Chest: Breath sounds normal. He has no wheezes. He has no rales.  Abdominal: Soft. There is no tenderness.  Musculoskeletal: He exhibits edema (Bilateral).  Lymphadenopathy:    He has no cervical adenopathy.  Skin: Skin is warm and dry. He is not diaphoretic.  Psychiatric: He has a normal mood and affect. His behavior is normal.          Assessment & Plan:    See problem list for assessment and plan  Follow-up in 6 months, sooner if needed

## 2015-03-20 NOTE — Patient Instructions (Signed)
  We have reviewed your prior records including labs and tests today.  All other Health Maintenance issues reviewed.   No immunizations administered today.   Medications reviewed and updated.  Will try a daily anti-inflammatory called meloxicam 15 mg daily.  We will try a steroid cream on your leg - call if no improvement.    Your prescription(s) have been submitted to your pharmacy. Please take as directed and contact our office if you believe you are having problem(s) with the medication(s).   Please schedule followup in 6 months

## 2015-03-20 NOTE — Assessment & Plan Note (Signed)
Diagnosed 2013.  Had surgery and chemotherapy (completed June 2014) Following with oncology, no evidence of recurrence

## 2015-03-20 NOTE — Assessment & Plan Note (Signed)
BP Readings from Last 3 Encounters:  03/20/15 130/84  12/22/14 145/72  06/16/14 142/65   Blood pressure controlled here today. We'll continue to monitor Continue current medications

## 2015-03-20 NOTE — Assessment & Plan Note (Signed)
Secondary to chemotherapy Following with podiatry

## 2015-03-20 NOTE — Assessment & Plan Note (Signed)
Following with pain management Currently taking narcotic pain medication We will try meloxicam daily with food-advised him to monitor for signs of GI bleeding

## 2015-03-20 NOTE — Progress Notes (Signed)
Pre visit review using our clinic review tool, if applicable. No additional management support is needed unless otherwise documented below in the visit note. 

## 2015-06-21 ENCOUNTER — Other Ambulatory Visit (HOSPITAL_BASED_OUTPATIENT_CLINIC_OR_DEPARTMENT_OTHER): Payer: 59

## 2015-06-21 ENCOUNTER — Ambulatory Visit (HOSPITAL_BASED_OUTPATIENT_CLINIC_OR_DEPARTMENT_OTHER): Payer: 59 | Admitting: Family

## 2015-06-21 ENCOUNTER — Other Ambulatory Visit: Payer: Self-pay | Admitting: Family

## 2015-06-21 ENCOUNTER — Encounter: Payer: Self-pay | Admitting: Family

## 2015-06-21 VITALS — BP 132/59 | HR 89 | Temp 97.9°F | Resp 18 | Ht 69.0 in | Wt >= 6400 oz

## 2015-06-21 DIAGNOSIS — C187 Malignant neoplasm of sigmoid colon: Secondary | ICD-10-CM

## 2015-06-21 DIAGNOSIS — Z0000 Encounter for general adult medical examination without abnormal findings: Secondary | ICD-10-CM

## 2015-06-21 DIAGNOSIS — Z85038 Personal history of other malignant neoplasm of large intestine: Secondary | ICD-10-CM

## 2015-06-21 DIAGNOSIS — Z6841 Body Mass Index (BMI) 40.0 and over, adult: Secondary | ICD-10-CM

## 2015-06-21 DIAGNOSIS — R600 Localized edema: Secondary | ICD-10-CM | POA: Diagnosis not present

## 2015-06-21 DIAGNOSIS — G629 Polyneuropathy, unspecified: Secondary | ICD-10-CM

## 2015-06-21 LAB — COMPREHENSIVE METABOLIC PANEL
ALBUMIN: 3.5 g/dL (ref 3.5–5.0)
ALK PHOS: 87 U/L (ref 40–150)
ALT: 17 U/L (ref 0–55)
AST: 14 U/L (ref 5–34)
Anion Gap: 10 mEq/L (ref 3–11)
BUN: 11.7 mg/dL (ref 7.0–26.0)
CALCIUM: 9 mg/dL (ref 8.4–10.4)
CO2: 25 mEq/L (ref 22–29)
CREATININE: 0.9 mg/dL (ref 0.7–1.3)
Chloride: 106 mEq/L (ref 98–109)
EGFR: >90 mL/min/{1.73_m2} (ref >90)
Glucose: 129 mg/dl (ref 70–140)
Potassium: 4.2 mEq/L (ref 3.5–5.1)
Sodium: 142 mEq/L (ref 136–145)
Total Bilirubin: 0.42 mg/dL (ref 0.20–1.20)
Total Protein: 6.5 g/dL (ref 6.4–8.3)

## 2015-06-21 LAB — CBC WITH DIFFERENTIAL (CANCER CENTER ONLY)
BASO#: 0 10*3/uL (ref 0.0–0.2)
BASO%: 0.4 % (ref 0.0–2.0)
EOS ABS: 0.4 10*3/uL (ref 0.0–0.5)
EOS%: 4.8 % (ref 0.0–7.0)
HCT: 37.7 % — ABNORMAL LOW (ref 38.7–49.9)
HGB: 12.9 g/dL — ABNORMAL LOW (ref 13.0–17.1)
LYMPH#: 1.8 10*3/uL (ref 0.9–3.3)
LYMPH%: 20 % (ref 14.0–48.0)
MCH: 28.1 pg (ref 28.0–33.4)
MCHC: 34.2 g/dL (ref 32.0–35.9)
MCV: 82 fL (ref 82–98)
MONO#: 0.7 10*3/uL (ref 0.1–0.9)
MONO%: 7.4 % (ref 0.0–13.0)
NEUT#: 6.1 10*3/uL (ref 1.5–6.5)
NEUT%: 67.4 % (ref 40.0–80.0)
Platelets: 263 10*3/uL (ref 145–400)
RBC: 4.59 10*6/uL (ref 4.20–5.70)
RDW: 14.1 % (ref 11.1–15.7)
WBC: 9.1 10*3/uL (ref 4.0–10.0)

## 2015-06-21 MED ORDER — FUROSEMIDE 40 MG PO TABS: 40.0000 mg | ORAL_TABLET | Freq: Every day | ORAL | Status: DC

## 2015-06-21 MED ORDER — POTASSIUM CHLORIDE CRYS ER 20 MEQ PO TBCR: 20.0000 meq | EXTENDED_RELEASE_TABLET | Freq: Every day | ORAL | Status: DC

## 2015-06-21 NOTE — Progress Notes (Signed)
Hematology and Oncology Follow Up Visit  Brandon Schaefer [id number] 08-09-1955 60 y.o. 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 He is doing fairly well. He states that he has severe arthritis in his knees and this makes it hard for him to get around. He uses his walker at home as needed.  He is a diabetic and his blood sugars are not well controlled. He has neuropathy in his feet that he states is unchanged.  No fever, chills, n/v, cough, rash, dizziness, SOB, chest pain, palpitations, abdominal pain or changes in bowel or bladder habits. No constipation, diarrhea or blood in his stool.  His CEA in September was 1.6.  He has +3 pitting edema in both lower extremities. He has fluid blisters on both shins some of which have burst and are slowly healing. His pedal pulses are +2.  He states that he has a good appetite and is staying well hydrated. His weight is up 19 lbs since December.   Medications:    Medication List       This list is accurate as of: 06/21/15  8:49 AM.  Always use your most recent med list.               carvedilol 6.25 MG tablet  Commonly known as:  COREG  Take 6.25 mg by mouth 2 (two) times daily with a meal.     clobetasol 0.05 % external solution  Commonly known as:  TEMOVATE  Apply 1 application topically 2 (two) times daily. Do not use for more than 14 days in a row     HORIZANT 600 MG Tbcr  Generic drug:  Gabapentin Enacarbil     insulin aspart protamine- aspart (70-30) 100 UNIT/ML injection  Commonly known as:  NOVOLOG MIX 70/30  Inject 100 Units into the skin.     meloxicam 15 MG tablet  Commonly known as:  MOBIC  Take 1 tablet (15 mg total) by mouth daily.     metFORMIN 1000 MG tablet  Commonly known as:  GLUCOPHAGE  Take 1,000 mg by  mouth daily with breakfast.     NUCYNTA ER 150 MG Tb12  Generic drug:  Tapentadol HCl  TK 1 T PO Q 12 H     oxyCODONE 15 MG immediate release tablet  Commonly known as:  ROXICODONE  TK 1 T PO FOUR TO FIVE TIMES A DAY     potassium citrate 10 MEQ (1080 MG) SR tablet  Commonly known as:  UROCIT-K  Take 10 mEq by mouth 2 (two) times daily.     ramipril 10 MG capsule  Commonly known as:  ALTACE  Take 10 mg by mouth daily before breakfast.     rosuvastatin 20 MG tablet  Commonly known as:  CRESTOR  Take 1 tablet (20 mg total) by mouth daily before breakfast.     senna-docusate 8.6-50 MG tablet  Commonly known as:  Senokot-S  Take 1 tablet by mouth 2 (two) times daily. While taking pain meds to prevent constipation     sertraline 100 MG tablet  Commonly known as:  ZOLOFT  Take 150 mg by mouth daily before breakfast.     TRADJENTA 5 MG Tabs tablet  Generic drug:  linagliptin  Take by mouth daily.  Allergies:  Allergies  Allergen Reactions  . Adhesive [Tape]     Past Medical History, Surgical history, Social history, and Family History were reviewed and updated.  Review of Systems: All other 10 point review of systems is negative.   Physical Exam:  height is _0  (1.753 m) and weight is 422 lb (191.418 kg). His oral temperature is 97.9 F (36.6 C). His blood pressure is 132/59 and his pulse is 89. His respiration is 18.   Wt Readings from Last 3 Encounters:  06/21/15 422 lb (191.418 kg)  03/20/15 403 lb (182.8 kg)  12/22/14 400 lb (181.439 kg)    Ocular: Sclerae unicteric, pupils equal, round and reactive to light Ear-nose-throat: Oropharynx clear, dentition fair Lymphatic: No cervical supraclavicular or axillary adenopathy Lungs no rales or rhonchi, good excursion bilaterally Heart regular rate and rhythm, no murmur appreciated Abd soft, nontender, positive bowel sounds, no liver or spleen tip palpated on exam  MSK no focal spinal tenderness, no joint  edema Neuro: non-focal, well-oriented, appropriate affect Breasts: Deferred   Lab Results  Component Value Date   WBC 9.1 06/21/2015   HGB 12.9* 06/21/2015   HCT 37.7* 06/21/2015   MCV 82 06/21/2015   PLT 263 06/21/2015   Lab Results  Component Value Date   FERRITIN 85 06/16/2014   IRON 58 06/16/2014   TIBC 336 06/16/2014   UIBC 279 06/16/2014   IRONPCTSAT 17* 06/16/2014   Lab Results  Component Value Date   RBC 4.59 06/21/2015   No results found for: KPAFRELGTCHN, LAMBDASER, KAPLAMBRATIO No results found for: Kandis Cocking, IGMSERUM No results found for: Kathrynn Ducking, MSPIKE, SPEI   Chemistry      Component Value Date/Time   NA 137 12/22/2014 0751   NA 138 09/14/2013 1027   NA 139 02/08/2013 1140   K 4.5 12/22/2014 0751   K 4.3 09/14/2013 1027   K 4.1 02/08/2013 1140   CL 98 12/22/2014 0751   CL 105 02/08/2013 1140   CO2 25 12/22/2014 0751   CO2 17* 09/14/2013 1027   CO2 21 02/08/2013 1140   BUN 14 12/22/2014 0751   BUN 12.8 09/14/2013 1027   BUN 10 02/08/2013 1140   CREATININE 1.0 12/22/2014 0751   CREATININE 0.8 09/14/2013 1027   CREATININE 0.81 02/08/2013 1140      Component Value Date/Time   CALCIUM 9.0 12/22/2014 0751   CALCIUM 8.9 09/14/2013 1027   CALCIUM 8.9 02/08/2013 1140   ALKPHOS 99* 12/22/2014 0751   ALKPHOS 82 09/14/2013 1027   ALKPHOS 82 02/08/2013 1140   AST 23 12/22/2014 0751   AST 20 09/14/2013 1027   AST 19 02/08/2013 1140   ALT 27 12/22/2014 0751   ALT 24 09/14/2013 1027   ALT 20 02/08/2013 1140   BILITOT 0.80 12/22/2014 0751   BILITOT 0.35 09/14/2013 1027   BILITOT 0.4 02/08/2013 1140     Impression and Plan: Brandon Schaefer is 60 yo gentleman with a history of a stage II colon cancer. He completed chemotherapy in June 2014. He was felt to be at high risk for recurrence due to microsatellite stability. So far he has done well and there has been no evidence of recurrence.  His CEA in  September was 1.6. Level today is pending. CBC looks good.  The the pitting edema, fluid blisters and sores on his legs are concerning with his diabetic history. We will go ahead and start him on lasix 40 mg daily and  also KDUR 20 meq daily. He will then follow-up with his PCP Dr. Quay Burow for continued management of this.   We will plan to see him back in 6 months for lab work and follow-up.  He will contact us with any questions or concerns. We can certainly see him sooner if need be.   Eliezer Bottom, NP 3/8/20178:49 AM

## 2015-06-22 LAB — CEA: CEA: 2.2 ng/mL (ref 0.0–4.7)

## 2015-06-22 LAB — CEA (PARALLEL TESTING): CEA: 1.2 ng/mL

## 2015-07-06 ENCOUNTER — Encounter: Payer: Self-pay | Admitting: Hematology & Oncology

## 2015-08-03 ENCOUNTER — Ambulatory Visit (INDEPENDENT_AMBULATORY_CARE_PROVIDER_SITE_OTHER): Payer: 59 | Admitting: Family

## 2015-08-03 ENCOUNTER — Encounter: Payer: Self-pay | Admitting: Family

## 2015-08-03 ENCOUNTER — Encounter: Payer: 59 | Admitting: Internal Medicine

## 2015-08-03 VITALS — BP 180/90 | HR 98 | Temp 98.1°F | Resp 20 | Ht 69.0 in | Wt 395.0 lb

## 2015-08-03 DIAGNOSIS — K047 Periapical abscess without sinus: Secondary | ICD-10-CM

## 2015-08-03 HISTORY — DX: Periapical abscess without sinus: K04.7

## 2015-08-03 MED ORDER — LIDOCAINE VISCOUS 2 % MT SOLN: 10.0000 mL | OROMUCOSAL | Status: DC | PRN

## 2015-08-03 MED ORDER — AMOXICILLIN-POT CLAVULANATE 875-125 MG PO TABS: 1.0000 | ORAL_TABLET | Freq: Two times a day (BID) | ORAL | Status: DC

## 2015-08-03 NOTE — Progress Notes (Signed)
Pre visit review using our clinic review tool, if applicable. No additional management support is needed unless otherwise documented below in the visit note. 

## 2015-08-03 NOTE — Progress Notes (Signed)
Subjective:    Patient ID: Brandon Schaefer, male    DOB: April 16, 1955, 60 y.o.   MRN: [id number]  HPI  cancelled  Medications and allergies reviewed with patient and updated if appropriate.  Patient Active Problem List   Diagnosis Date Noted  . Back pain 03/20/2015  . Anemia, iron deficiency 05/05/2012  . DM (diabetes mellitus) ([place]) 01/18/2012  . HTN (hypertension) 01/18/2012  . Morbid obesity with body mass index of 60.0-69.9 in adult ([place]) 01/17/2012  . Respiratory failure, post-operative ([place]) 01/17/2012  . Cancer of sigmoid colon ([place]) 12/23/2011  . Arthritis of knee, degenerative 08/09/2011  . Peripheral nerve disease ([place]) 01/01/2006    Current Outpatient Prescriptions on File Prior to Visit  Medication Sig Dispense Refill  . carvedilol (COREG) 6.25 MG tablet Take 6.25 mg by mouth 2 (two) times daily with a meal.    . clobetasol (TEMOVATE) 0.05 % external solution Apply 1 application topically 2 (two) times daily. Do not use for more than 14 days in a row 50 mL 0  . furosemide (LASIX) 40 MG tablet TAKE 1 TABLET(40 MG) BY MOUTH DAILY 90 tablet 1  . HORIZANT 600 MG TBCR   4  . insulin aspart protamine- aspart (NOVOLOG MIX 70/30) (70-30) 100 UNIT/ML injection Inject 100 Units into the skin.    . meloxicam (MOBIC) 15 MG tablet Take 1 tablet (15 mg total) by mouth daily. 90 tablet 1  . metFORMIN (GLUCOPHAGE) 1000 MG tablet Take 1,000 mg by mouth daily with breakfast.     . NUCYNTA ER 150 MG TB12 TK 1 T PO Q 12 H  0  . oxyCODONE (ROXICODONE) 15 MG immediate release tablet TK 1 T PO FOUR TO FIVE TIMES A DAY  0  . potassium chloride SA (K-DUR,KLOR-CON) 20 MEQ tablet TAKE 1 TABLET(20 MEQ) BY MOUTH DAILY 90 tablet 1  . potassium citrate (UROCIT-K) 10 MEQ (1080 MG) SR tablet Take 10 mEq by mouth 2 (two) times daily.     . ramipril (ALTACE) 10 MG capsule Take 10 mg by mouth daily before breakfast.    . rosuvastatin (CRESTOR) 20 MG tablet Take 1 tablet (20 mg total) by mouth daily  before breakfast. 90 tablet 1  . senna-docusate (SENOKOT-S) 8.6-50 MG per tablet Take 1 tablet by mouth 2 (two) times daily. While taking pain meds to prevent constipation 30 tablet 0  . sertraline (ZOLOFT) 100 MG tablet Take 150 mg by mouth daily before breakfast.    . TRADJENTA 5 MG TABS tablet Take by mouth daily.      No current facility-administered medications on file prior to visit.    Past Medical History  Diagnosis Date  . Hypertension   . Diabetes mellitus   . Allergy   . Hyperlipidemia   . Anemia   . Anxiety   . Shortness of breath     with exertion  . Headache(784.0)   . Neuromuscular disorder ([place])     peripheral neuropathy  . History of blood transfusion   . colon ca dx'd 11/2011    Past Surgical History  Procedure Laterality Date  . Fracture surgery       ORIF-left radius has pin  . Pilonidal cyst excision    . Partial colectomy  01/17/2012    Procedure: PARTIAL COLECTOMY;  Surgeon: Harl Bowie, MD;  [facility];  Service: General;;  . Proctoscopy  01/17/2012    Procedure: PROCTOSCOPY;  Surgeon: Harl Bowie, MD;  [facility];  Service: General;;  . Portacath placement  03/19/2012    Procedure: INSERTION PORT-A-CATH;  Surgeon: Harl Bowie, MD;  Location: [facility];  Service: General;  Laterality: N/A;  . Nephrolithotomy Left 01/04/2013    Procedure: NEPHROLITHOTOMY PERCUTANEOUS  LEFT 1ST STAGE PERCUTANEOUS NEPHROSTOLITHOTOMY;  Surgeon: Alexis Frock, MD;  [facility];  Service: Urology;  Laterality: Left;  . Cystoscopy w/ ureteral stent placement Bilateral 01/04/2013    Procedure: CYSTOSCOPY WITH RETROGRADE PYELOGRAM/Right double J URETERAL STENT PLACEMENT;  Surgeon: Alexis Frock, MD;  [facility];  Service: Urology;  Laterality: Bilateral;  . Holmium laser application N/A [id number]    Procedure: HOLMIUM LASER APPLICATION;  Surgeon: Alexis Frock, MD;  [facility];  Service: Urology;  Laterality: N/A;    . Nephrolithotomy Left 01/06/2013    Procedure: NEPHROLITHOTOMY PERCUTANEOUS SECOND LOOK;  Surgeon: Alexis Frock, MD;  [facility];  Service: Urology;  Laterality: Left;  . Cystoscopy with retrograde pyelogram, ureteroscopy and stent placement Right 01/06/2013    Procedure: CYSTOSCOPY WITH RIGHT RETROGRADE PYELOGRAM, URETEROSCOPY AND BILATERAL STENT EXCHANGE, BILATERAL STONE BASKET EXTRACTION ;  Surgeon: Alexis Frock, MD;  [facility];  Service: Urology;  Laterality: Right;  . Port-a-cath removal Left 01/06/2013    Procedure: REMOVAL PORT-A-CATH;  Surgeon: Harl Bowie, MD;  [facility];  Service: General;  Laterality: Left;  . Complex wound closure N/A 01/06/2013    Procedure: EXCISION CHRONIC ABDOMINAL WOUND;  Surgeon: Harl Bowie, MD;  [facility];  Service: General;  Laterality: N/A;    Social History   Social History  . Marital Status: Single    Spouse Name: N/A  . Number of Children: N/A  . Years of Education: N/A   Social History Main Topics  . Smoking status: Never Smoker   . Smokeless tobacco: Never Used     Comment: NEVER USED TOBACC0  . Alcohol Use: No  . Drug Use: No  . Sexual Activity: Not on file   Other Topics Concern  . Not on file   Social History Narrative    Family History  Problem Relation Age of Onset  . Diabetes Father   . Cancer Maternal Aunt     colon  . Cancer Maternal Grandmother     colon    Review of Systems     Objective:  There were no vitals filed for this visit. There were no vitals filed for this visit. There is no weight on file to calculate BMI.   Physical Exam        Assessment & Plan:    This encounter was created in error - please disregard.

## 2015-08-03 NOTE — Assessment & Plan Note (Signed)
Dental abscess and poor dentition noted. Vital signs are stable and no fevers. Start Augmentin to cover for infection. This is complicated by uncontrolled diabetes. Start lidocaine solution as needed for discomfort. Follow up with dentistry for further assessment and evaluation.

## 2015-08-03 NOTE — Progress Notes (Signed)
Subjective:    Patient ID: Brandon Schaefer, male    DOB: [birth year], 60 y.o.   MRN: [id number]  Chief Complaint  Patient presents with  . Jaw Pain    had an abcess tooth and now the pain is in his jaw, can not eat due to the pain     HPI:  Brandon Schaefer is a 60 y.o. male who  has a past medical history of Hypertension; Diabetes mellitus; Allergy; Hyperlipidemia; Anemia; Anxiety; Shortness of breath; Headache(784.0); Neuromuscular disorder ([place]); History of blood transfusion; and colon ca (dx'd 11/2011). and presents today for an acute office visit.  This is a new problem. Associated symptom of pain located in his jaw following an abscessed tooth has been going on for 4 days. Pain is described and achy with a severity enough to effect his ability to eat. Modifying factors include Tylenol and orajel which helps the pain a little. Course of the swelling has improved, however the pain remains. No recent antibiotics.    Allergies  Allergen Reactions  . Adhesive [Tape]      Current Outpatient Prescriptions on File Prior to Visit  Medication Sig Dispense Refill  . carvedilol (COREG) 6.25 MG tablet Take 6.25 mg by mouth 2 (two) times daily with a meal.    . clobetasol (TEMOVATE) 0.05 % external solution Apply 1 application topically 2 (two) times daily. Do not use for more than 14 days in a row 50 mL 0  . furosemide (LASIX) 40 MG tablet TAKE 1 TABLET(40 MG) BY MOUTH DAILY 90 tablet 1  . HORIZANT 600 MG TBCR   4  . insulin aspart protamine- aspart (NOVOLOG MIX 70/30) (70-30) 100 UNIT/ML injection Inject 100 Units into the skin.    . meloxicam (MOBIC) 15 MG tablet Take 1 tablet (15 mg total) by mouth daily. 90 tablet 1  . metFORMIN (GLUCOPHAGE) 1000 MG tablet Take 1,000 mg by mouth daily with breakfast.     . NUCYNTA ER 150 MG TB12 TK 1 T PO Q 12 H  0  . oxyCODONE (ROXICODONE) 15 MG immediate release tablet TK 1 T PO FOUR TO FIVE TIMES A DAY  0  . potassium chloride SA (K-DUR,KLOR-CON) 20 MEQ  tablet TAKE 1 TABLET(20 MEQ) BY MOUTH DAILY 90 tablet 1  . potassium citrate (UROCIT-K) 10 MEQ (1080 MG) SR tablet Take 10 mEq by mouth 2 (two) times daily.     . ramipril (ALTACE) 10 MG capsule Take 10 mg by mouth daily before breakfast.    . rosuvastatin (CRESTOR) 20 MG tablet Take 1 tablet (20 mg total) by mouth daily before breakfast. 90 tablet 1  . senna-docusate (SENOKOT-S) 8.6-50 MG per tablet Take 1 tablet by mouth 2 (two) times daily. While taking pain meds to prevent constipation 30 tablet 0  . sertraline (ZOLOFT) 100 MG tablet Take 150 mg by mouth daily before breakfast.    . TRADJENTA 5 MG TABS tablet Take by mouth daily.      No current facility-administered medications on file prior to visit.     Past Surgical History  Procedure Laterality Date  . Fracture surgery       ORIF-left radius has pin  . Pilonidal cyst excision    . Partial colectomy  01/17/2012    Procedure: PARTIAL COLECTOMY;  Surgeon: Harl Bowie, MD;  Location: [facility];  Service: General;;  . Proctoscopy  01/17/2012    Procedure: PROCTOSCOPY;  Surgeon: Harl Bowie, MD;  Location: [facility];  Service: General;;  . Portacath placement  03/19/2012    Procedure: INSERTION PORT-A-CATH;  Surgeon: Harl Bowie, MD;  Location: [facility];  Service: General;  Laterality: N/A;  . Nephrolithotomy Left 01/04/2013    Procedure: NEPHROLITHOTOMY PERCUTANEOUS  LEFT 1ST STAGE PERCUTANEOUS NEPHROSTOLITHOTOMY;  Surgeon: Alexis Frock, MD;  Location: [facility];  Service: Urology;  Laterality: Left;  . Cystoscopy w/ ureteral stent placement Bilateral 01/04/2013    Procedure: CYSTOSCOPY WITH RETROGRADE PYELOGRAM/Right double J URETERAL STENT PLACEMENT;  Surgeon: Alexis Frock, MD;  [facility];  Service: Urology;  Laterality: Bilateral;  . Holmium laser application N/A [id number]    Procedure: HOLMIUM LASER APPLICATION;  Surgeon: Alexis Frock, MD;  [facility];  Service: Urology;  Laterality: N/A;   . Nephrolithotomy Left 01/06/2013    Procedure: NEPHROLITHOTOMY PERCUTANEOUS SECOND LOOK;  Surgeon: Alexis Frock, MD;  [facility];  Service: Urology;  Laterality: Left;  . Cystoscopy with retrograde pyelogram, ureteroscopy and stent placement Right 01/06/2013    Procedure: CYSTOSCOPY WITH RIGHT RETROGRADE PYELOGRAM, URETEROSCOPY AND BILATERAL STENT EXCHANGE, BILATERAL STONE BASKET EXTRACTION ;  Surgeon: Alexis Frock, MD;  [facility];  Service: Urology;  Laterality: Right;  . Port-a-cath removal Left 01/06/2013    Procedure: REMOVAL PORT-A-CATH;  Surgeon: Harl Bowie, MD;  [facility];  Service: General;  Laterality: Left;  . Complex wound closure N/A 01/06/2013    Procedure: EXCISION CHRONIC ABDOMINAL WOUND;  Surgeon: Harl Bowie, MD;  [facility];  Service: General;  Laterality: N/A;   Past Medical History  Diagnosis Date  . Hypertension   . Diabetes mellitus   . Allergy   . Hyperlipidemia   . Anemia   . Anxiety   . Shortness of breath     with exertion  . Headache(784.0)   . Neuromuscular disorder ([place])     peripheral neuropathy  . History of blood transfusion   . colon ca dx'd 11/2011      Review of Systems  Constitutional: Negative for fever and chills.  HENT: Positive for dental problem.        Positive for jaw swelling      Objective:    BP 180/90 mmHg  Pulse 98  Temp(Src) 98.1 F (36.7 C) (Oral)  Resp 20  Ht 5\' 9"  (1.753 m)  Wt 395 lb (179.171 kg)  BMI 58.30 kg/m2  SpO2 98% Nursing note and vital signs reviewed.  Physical Exam  Constitutional: He is oriented to person, place, and time. He appears well-developed and well-nourished. No distress.  HENT:  Mouth/Throat: Abnormal dentition. Dental abscesses present.  Cardiovascular: Normal rate, regular rhythm, normal heart sounds and intact distal pulses.   Pulmonary/Chest: Effort normal and breath sounds normal.  Neurological: He is alert and oriented to person, place, and  time.  Skin: Skin is warm and dry.  Psychiatric: He has a normal mood and affect. His behavior is normal. Judgment and thought content normal.       Assessment & Plan:   Problem List Items Addressed This Visit      Digestive   Dental abscess - Primary    Dental abscess and poor dentition noted. Vital signs are stable and no fevers. Start Augmentin to cover for infection. This is complicated by uncontrolled diabetes. Start lidocaine solution as needed for discomfort. Follow up with dentistry for further assessment and evaluation.       Relevant Medications   amoxicillin-clavulanate (AUGMENTIN) 875-125 MG tablet   lidocaine (XYLOCAINE) 2 %  solution       I am having Mr. Ferdig start on amoxicillin-clavulanate and lidocaine. I am also having him maintain his ramipril, carvedilol, sertraline, TRADJENTA, metFORMIN, senna-docusate, potassium citrate, oxyCODONE, HORIZANT, NUCYNTA ER, insulin aspart protamine- aspart, rosuvastatin, clobetasol, meloxicam, furosemide, and potassium chloride SA.   Meds ordered this encounter  Medications  . amoxicillin-clavulanate (AUGMENTIN) 875-125 MG tablet    Sig: Take 1 tablet by mouth 2 (two) times daily.    Dispense:  20 tablet    Refill:  0    Order Specific Question:  Supervising Provider    Answer:  Pricilla Holm A [id number]  . lidocaine (XYLOCAINE) 2 % solution    Sig: Use as directed 10 mLs in the mouth or throat as needed for mouth pain.    Dispense:  100 mL    Refill:  1    Order Specific Question:  Supervising Provider    Answer:  Pricilla Holm A [id number]     Follow-up: Return if symptoms worsen or fail to improve, for With dentistry for further assessment and evaluation. Mauricio Po, FNP

## 2015-08-03 NOTE — Patient Instructions (Addendum)
Thank you for choosing [facility].  Summary/Instructions:  Your prescription(s) have been submitted to your pharmacy or been printed and provided for you. Please take as directed and contact our office if you believe you are having problem(s) with the medication(s) or have any questions.  If your symptoms worsen or fail to improve, please contact our office for further instruction, or in case of emergency go directly to the emergency room at the closest medical facility.    Dental Abscess A dental abscess is a collection of pus in or around a tooth. CAUSES This condition is caused by a bacterial infection around the root of the tooth that involves the inner part of the tooth (pulp). It may result from:  Severe tooth decay.  Trauma to the tooth that allows bacteria to enter into the pulp, such as a broken or chipped tooth.  Severe gum disease around a tooth. SYMPTOMS Symptoms of this condition include:  Severe pain in and around the infected tooth.  Swelling and redness around the infected tooth, in the mouth, or in the face.  Tenderness.  Pus drainage.  Bad breath.  Bitter taste in the mouth.  Difficulty swallowing.  Difficulty opening the mouth.  Nausea.  Vomiting.  Chills.  Swollen neck glands.  Fever. DIAGNOSIS This condition is diagnosed with examination of the infected tooth. During the exam, your dentist may tap on the infected tooth. Your dentist will also ask about your medical and dental history and may order X-rays. TREATMENT This condition is treated by eliminating the infection. This may be done with:  Antibiotic medicine.  A root canal. This may be performed to save the tooth.  Pulling (extracting) the tooth. This may also involve draining the abscess. This is done if the tooth cannot be saved. HOME CARE INSTRUCTIONS  Take medicines only as directed by your dentist.  If you were prescribed antibiotic medicine, finish all of it even if  you start to feel better.  Rinse your mouth (gargle) often with salt water to relieve pain or swelling.  Do not drive or operate heavy machinery while taking pain medicine.  Do not apply heat to the outside of your mouth.  Keep all follow-up visits as directed by your dentist. This is important. SEEK MEDICAL CARE IF:  Your pain is worse and is not helped by medicine. SEEK IMMEDIATE MEDICAL CARE IF:  You have a fever or chills.  Your symptoms suddenly get worse.  You have a very bad headache.  You have problems breathing or swallowing.  You have trouble opening your mouth.  You have swelling in your neck or around your eye.   This information is not intended to replace advice given to you by your health care provider. Make sure you discuss any questions you have with your health care provider.   Document Released: 04/01/2005 Document Revised: 08/16/2014 Document Reviewed: 03/29/2014 Elsevier Interactive Patient Education [employer].

## 2015-08-09 ENCOUNTER — Other Ambulatory Visit: Payer: Self-pay | Admitting: Internal Medicine

## 2015-08-09 ENCOUNTER — Other Ambulatory Visit: Payer: Self-pay | Admitting: *Deleted

## 2015-08-09 MED ORDER — CARVEDILOL 6.25 MG PO TABS: 6.2500 mg | ORAL_TABLET | Freq: Two times a day (BID) | ORAL | Status: DC

## 2015-08-09 MED ORDER — RAMIPRIL 10 MG PO CAPS: 10.0000 mg | ORAL_CAPSULE | Freq: Every day | ORAL | Status: DC

## 2015-08-09 MED ORDER — ROSUVASTATIN CALCIUM 20 MG PO TABS: 20.0000 mg | ORAL_TABLET | Freq: Every day | ORAL | Status: DC

## 2015-08-09 NOTE — Telephone Encounter (Signed)
Received call pt requesting refills on his BP meds & cholesterol med until he see md on May 5th. Verified medications & pharmacy inform will send 30 day to [facility] until appt...Brandon Schaefer

## 2015-08-22 ENCOUNTER — Ambulatory Visit (INDEPENDENT_AMBULATORY_CARE_PROVIDER_SITE_OTHER): Payer: 59 | Admitting: Internal Medicine

## 2015-08-22 ENCOUNTER — Encounter: Payer: Self-pay | Admitting: Internal Medicine

## 2015-08-22 VITALS — BP 144/78 | HR 91 | Temp 97.8°F | Resp 18 | Wt >= 6400 oz

## 2015-08-22 DIAGNOSIS — I10 Essential (primary) hypertension: Secondary | ICD-10-CM

## 2015-08-22 DIAGNOSIS — R600 Localized edema: Secondary | ICD-10-CM | POA: Diagnosis not present

## 2015-08-22 DIAGNOSIS — E119 Type 2 diabetes mellitus without complications: Secondary | ICD-10-CM | POA: Diagnosis not present

## 2015-08-22 DIAGNOSIS — Z1159 Encounter for screening for other viral diseases: Secondary | ICD-10-CM | POA: Diagnosis not present

## 2015-08-22 DIAGNOSIS — Z6841 Body Mass Index (BMI) 40.0 and over, adult: Secondary | ICD-10-CM

## 2015-08-22 DIAGNOSIS — Z794 Long term (current) use of insulin: Secondary | ICD-10-CM

## 2015-08-22 MED ORDER — FUROSEMIDE 40 MG PO TABS: ORAL_TABLET | ORAL | Status: DC

## 2015-08-22 MED ORDER — POTASSIUM CHLORIDE CRYS ER 20 MEQ PO TBCR: EXTENDED_RELEASE_TABLET | ORAL | Status: DC

## 2015-08-22 MED ORDER — LINAGLIPTIN 5 MG PO TABS: 5.0000 mg | ORAL_TABLET | Freq: Every day | ORAL | Status: DC

## 2015-08-22 MED ORDER — MELOXICAM 15 MG PO TABS: 15.0000 mg | ORAL_TABLET | Freq: Every day | ORAL | Status: DC

## 2015-08-22 MED ORDER — RAMIPRIL 10 MG PO CAPS: ORAL_CAPSULE | ORAL | Status: DC

## 2015-08-22 MED ORDER — ROSUVASTATIN CALCIUM 20 MG PO TABS: 20.0000 mg | ORAL_TABLET | Freq: Every day | ORAL | Status: DC

## 2015-08-22 NOTE — Patient Instructions (Addendum)
  Test(s) ordered today. Your results will be released to MyChart (or called to you) after review, usually within 72hours after test completion. If any changes need to be made, you will be notified at that same time.  All other Health Maintenance issues reviewed.   All recommended immunizations and age-appropriate screenings are up-to-date or discussed.  No immunizations administered today.   Medications reviewed and updated.  No changes recommended at this time.  Your prescription(s) have been submitted to your pharmacy. Please take as directed and contact our office if you believe you are having problem(s) with the medication(s).   Please followup in 3 months   

## 2015-08-22 NOTE — Progress Notes (Signed)
Subjective:    Patient ID: Brandon Schaefer, male    DOB: 1955/10/16, 60 y.o.   MRN: [id number]  HPI He is here for follow up.  Diabetes: he is following with endocrine - he felt his sugars were very well controlled. He is taking his medication daily as prescribed. He is compliant with a diabetic diet. He is not exercising regularly. He monitors his sugars and they have been running 113 this morning. He checks his feet daily and denies foot lesions. He is not up-to-date with an ophthalmology examination.   Hypertension: He is taking his medication daily. He is compliant with a low sodium diet.  He denies chest pain, palpitations, change in his chronic shortness of breath and regular headaches. He is exercising regularly.  He does monitor his blood pressure at home, 120-130/70-80.  appt earlier today: 130/78, 160/78.    Knee pain, arthritis:  He follows with pain management and they are prescribing his narcotic pain medication.  We start meloxciam at his last visit.   Peripheral neuropathy:  This is related to his chemotherapy for colon cancer. He is following with podiatry.   Edema;  He was placed on lasix 40 mg daily by his oncologist.  He thinks this has helped the swelling.  He is compliant with a low sodium diet.    Medications and allergies reviewed with patient and updated if appropriate.  Patient Active Problem List   Diagnosis Date Noted  . Dental abscess 08/03/2015  . Back pain 03/20/2015  . Anemia, iron deficiency 05/05/2012  . DM (diabetes mellitus) ([place]) 01/18/2012  . HTN (hypertension) 01/18/2012  . Morbid obesity with body mass index of 60.0-69.9 in adult ([place]) 01/17/2012  . Respiratory failure, post-operative ([place]) 01/17/2012  . Cancer of sigmoid colon ([place]) 12/23/2011  . Arthritis of knee, degenerative 08/09/2011  . Peripheral nerve disease ([place]) 01/01/2006    Current Outpatient Prescriptions on File Prior to Visit  Medication Sig Dispense Refill  . carvedilol  (COREG) 6.25 MG tablet TAKE 1 TABLET BY MOUTH 2 TIMES DAILY, WITH A MEAL 180 tablet 2  . clobetasol (TEMOVATE) 0.05 % external solution Apply 1 application topically 2 (two) times daily. Do not use for more than 14 days in a row 50 mL 0  . furosemide (LASIX) 40 MG tablet TAKE 1 TABLET(40 MG) BY MOUTH DAILY 90 tablet 1  . HORIZANT 600 MG TBCR   4  . insulin aspart protamine- aspart (NOVOLOG MIX 70/30) (70-30) 100 UNIT/ML injection Inject 100 Units into the skin.    [name] lidocaine (XYLOCAINE) 2 % solution Use as directed 10 mLs in the mouth or throat as needed for mouth pain. 100 mL 1  . meloxicam (MOBIC) 15 MG tablet Take 1 tablet (15 mg total) by mouth daily. 90 tablet 1  . metFORMIN (GLUCOPHAGE) 1000 MG tablet Take 1,000 mg by mouth daily with breakfast.     . NUCYNTA ER 150 MG TB12 TK 1 T PO Q 12 H  0  . oxyCODONE (ROXICODONE) 15 MG immediate release tablet TK 1 T PO FOUR TO FIVE TIMES A DAY  0  . potassium chloride SA (K-DUR,KLOR-CON) 20 MEQ tablet TAKE 1 TABLET(20 MEQ) BY MOUTH DAILY 90 tablet 1  . potassium citrate (UROCIT-K) 10 MEQ (1080 MG) SR tablet Take 10 mEq by mouth 2 (two) times daily.     . ramipril (ALTACE) 10 MG capsule TAKE ONE CAPSULE BY MOUTH DAILY BEFORE BREAKFAST 90 capsule 2  . rosuvastatin (CRESTOR) 20  MG tablet Take 1 tablet (20 mg total) by mouth daily before breakfast. Keep May appt for future refills 30 tablet 0  . senna-docusate (SENOKOT-S) 8.6-50 MG per tablet Take 1 tablet by mouth 2 (two) times daily. While taking pain meds to prevent constipation 30 tablet 0  . sertraline (ZOLOFT) 100 MG tablet Take 150 mg by mouth daily before breakfast.    . TRADJENTA 5 MG TABS tablet Take by mouth daily.      No current facility-administered medications on file prior to visit.    Past Medical History  Diagnosis Date  . Hypertension   . Diabetes mellitus   . Allergy   . Hyperlipidemia   . Anemia   . Anxiety   . Shortness of breath     with exertion  . Headache(784.0)   .  Neuromuscular disorder ([place])     peripheral neuropathy  . History of blood transfusion   . colon ca dx'd 11/2011    Past Surgical History  Procedure Laterality Date  . Fracture surgery       ORIF-left radius has pin  . Pilonidal cyst excision    . Partial colectomy  01/17/2012    Procedure: PARTIAL COLECTOMY;  Surgeon: Harl Bowie, MD;  [facility];  Service: General;;  . Proctoscopy  01/17/2012    Procedure: PROCTOSCOPY;  Surgeon: Harl Bowie, MD;  Location[facility];  Service: General;;  . Portacath placement  03/19/2012    Procedure: INSERTION PORT-A-CATH;  Surgeon: Harl Bowie, MD;  Location: [facility];  Service: General;  Laterality: N/A;  . Nephrolithotomy Left 01/04/2013    Procedure: NEPHROLITHOTOMY PERCUTANEOUS  LEFT 1ST STAGE PERCUTANEOUS NEPHROSTOLITHOTOMY;  Surgeon: Alexis Frock, MD;  Location[facility];  Service: Urology;  Laterality: Left;  . Cystoscopy w/ ureteral stent placement Bilateral 01/04/2013    Procedure: CYSTOSCOPY WITH RETROGRADE PYELOGRAM/Right double J URETERAL STENT PLACEMENT;  Surgeon: Alexis Frock, MD;  [facility];  Service: Urology;  Laterality: Bilateral;  . Holmium laser application N/A [id number]    Procedure: HOLMIUM LASER APPLICATION;  Surgeon: Alexis Frock, MD;  [facility];  Service: Urology;  Laterality: N/A;  . Nephrolithotomy Left 01/06/2013    Procedure: NEPHROLITHOTOMY PERCUTANEOUS SECOND LOOK;  Surgeon: Alexis Frock, MD;  [facility];  Service: Urology;  Laterality: Left;  . Cystoscopy with retrograde pyelogram, ureteroscopy and stent placement Right 01/06/2013    Procedure: CYSTOSCOPY WITH RIGHT RETROGRADE PYELOGRAM, URETEROSCOPY AND BILATERAL STENT EXCHANGE, BILATERAL STONE BASKET EXTRACTION ;  Surgeon: Alexis Frock, MD;  [facility];  Service: Urology;  Laterality: Right;  . Port-a-cath removal Left 01/06/2013    Procedure: REMOVAL PORT-A-CATH;  Surgeon: Harl Bowie, MD;   [facility];  Service: General;  Laterality: Left;  . Complex wound closure N/A 01/06/2013    Procedure: EXCISION CHRONIC ABDOMINAL WOUND;  Surgeon: Harl Bowie, MD;  Location[facility];  Service: General;  Laterality: N/A;    Social History   Social History  . Marital Status: Single    Spouse Name: N/A  . Number of Children: N/A  . Years of Education: N/A   Social History Main Topics  . Smoking status: Never Smoker   . Smokeless tobacco: Never Used     Comment: NEVER USED TOBACC0  . Alcohol Use: No  . Drug Use: No  . Sexual Activity: Not on file   Other Topics Concern  . Not on file   Social History Narrative  Family History  Problem Relation Age of Onset  . Diabetes Father   . Cancer Maternal Aunt     colon  . Cancer Maternal Grandmother     colon    Review of Systems  Constitutional: Negative for fever and chills.  Respiratory: Positive for shortness of breath (chronic, slightly better). Negative for cough and wheezing.   Cardiovascular: Positive for leg swelling. Negative for chest pain and palpitations.  Neurological: Positive for numbness (in feet). Negative for dizziness, light-headedness and headaches.       Objective:   Filed Vitals:   08/22/15 1556  BP: 144/78  Pulse: 91  Temp: 97.8 F (36.6 C)  Resp: 18   Filed Weights   08/22/15 1556  Weight: 411 lb (186.428 kg)   Body mass index is 60.67 kg/(m^2).   Physical Exam Constitutional: Appears well-developed and well-nourished. No distress.  Neck: Neck supple. No tracheal deviation present. No thyromegaly present.  No carotid bruit. No cervical adenopathy.   Cardiovascular: Normal rate, regular rhythm and normal heart sounds.   No murmur heard.  1+ pitting edema b/l LE  Pulmonary/Chest: Effort normal and breath sounds normal. No respiratory distress. No wheezes.  Skin: irregularly shaped patches of erythema on b/l LE,no ulcers of discharge      Assessment & Plan:

## 2015-08-22 NOTE — Assessment & Plan Note (Signed)
On lasix 40 mg daily, with KCL - continue Check cmp Work on weight loss Low sodium diet Elevate legs when able

## 2015-08-22 NOTE — Assessment & Plan Note (Signed)
Stressed decreasing portions to help lower weight Stay active - as much as possible

## 2015-08-22 NOTE — Progress Notes (Signed)
Pre visit review using our clinic review tool, if applicable. No additional management support is needed unless otherwise documented below in the visit note. 

## 2015-08-22 NOTE — Assessment & Plan Note (Signed)
Following with endo Sugars well controlled at home Check a1c, microalbumin Med management per endo Work on weight loss Schedule eye appt Follow up in 3 months

## 2015-08-22 NOTE — Assessment & Plan Note (Signed)
Slightly elevated today - he will monitor at home Stressed weight loss - decrease portions, keep active cmp May need to increase coreg if needed

## 2015-09-07 ENCOUNTER — Other Ambulatory Visit: Payer: Self-pay | Admitting: Internal Medicine

## 2015-10-24 ENCOUNTER — Telehealth: Payer: Self-pay

## 2015-10-24 MED ORDER — SERTRALINE HCL 100 MG PO TABS: 150.0000 mg | ORAL_TABLET | Freq: Every day | ORAL | Status: DC

## 2015-10-24 NOTE — Telephone Encounter (Signed)
Pt called and rq rf for sertraline.   erx sent to [facility].

## 2015-11-25 ENCOUNTER — Emergency Department (HOSPITAL_COMMUNITY): Payer: 59

## 2015-11-25 ENCOUNTER — Encounter (HOSPITAL_COMMUNITY): Payer: Self-pay | Admitting: Emergency Medicine

## 2015-11-25 ENCOUNTER — Inpatient Hospital Stay (HOSPITAL_COMMUNITY)
Admission: EM | Admit: 2015-11-25 | Discharge: 2015-12-02 | DRG: 872 | Disposition: A | Discharge status: Home Health | Payer: 59 | Attending: Internal Medicine | Admitting: Internal Medicine

## 2015-11-25 DIAGNOSIS — M549 Dorsalgia, unspecified: Secondary | ICD-10-CM | POA: Diagnosis present

## 2015-11-25 DIAGNOSIS — G629 Polyneuropathy, unspecified: Secondary | ICD-10-CM | POA: Diagnosis present

## 2015-11-25 DIAGNOSIS — L03115 Cellulitis of right lower limb: Secondary | ICD-10-CM | POA: Diagnosis present

## 2015-11-25 DIAGNOSIS — F419 Anxiety disorder, unspecified: Secondary | ICD-10-CM | POA: Diagnosis present

## 2015-11-25 DIAGNOSIS — Z6841 Body Mass Index (BMI) 40.0 and over, adult: Secondary | ICD-10-CM

## 2015-11-25 DIAGNOSIS — Z794 Long term (current) use of insulin: Secondary | ICD-10-CM

## 2015-11-25 DIAGNOSIS — E876 Hypokalemia: Secondary | ICD-10-CM | POA: Diagnosis present

## 2015-11-25 DIAGNOSIS — A419 Sepsis, unspecified organism: Principal | ICD-10-CM | POA: Diagnosis present

## 2015-11-25 DIAGNOSIS — Z7982 Long term (current) use of aspirin: Secondary | ICD-10-CM

## 2015-11-25 DIAGNOSIS — L02419 Cutaneous abscess of limb, unspecified: Secondary | ICD-10-CM | POA: Diagnosis present

## 2015-11-25 DIAGNOSIS — E872 Acidosis, unspecified: Secondary | ICD-10-CM | POA: Diagnosis present

## 2015-11-25 DIAGNOSIS — S8251XA Displaced fracture of medial malleolus of right tibia, initial encounter for closed fracture: Secondary | ICD-10-CM | POA: Diagnosis present

## 2015-11-25 DIAGNOSIS — B370 Candidal stomatitis: Secondary | ICD-10-CM | POA: Diagnosis present

## 2015-11-25 DIAGNOSIS — E1165 Type 2 diabetes mellitus with hyperglycemia: Secondary | ICD-10-CM | POA: Diagnosis present

## 2015-11-25 DIAGNOSIS — Y92009 Unspecified place in unspecified non-institutional (private) residence as the place of occurrence of the external cause: Secondary | ICD-10-CM

## 2015-11-25 DIAGNOSIS — L03119 Cellulitis of unspecified part of limb: Secondary | ICD-10-CM

## 2015-11-25 DIAGNOSIS — S82891A Other fracture of right lower leg, initial encounter for closed fracture: Secondary | ICD-10-CM | POA: Diagnosis present

## 2015-11-25 DIAGNOSIS — Z79899 Other long term (current) drug therapy: Secondary | ICD-10-CM

## 2015-11-25 DIAGNOSIS — E119 Type 2 diabetes mellitus without complications: Secondary | ICD-10-CM

## 2015-11-25 DIAGNOSIS — E785 Hyperlipidemia, unspecified: Secondary | ICD-10-CM | POA: Diagnosis present

## 2015-11-25 DIAGNOSIS — L0390 Cellulitis, unspecified: Secondary | ICD-10-CM

## 2015-11-25 DIAGNOSIS — Z9049 Acquired absence of other specified parts of digestive tract: Secondary | ICD-10-CM

## 2015-11-25 DIAGNOSIS — E1142 Type 2 diabetes mellitus with diabetic polyneuropathy: Secondary | ICD-10-CM | POA: Diagnosis present

## 2015-11-25 DIAGNOSIS — M179 Osteoarthritis of knee, unspecified: Secondary | ICD-10-CM | POA: Diagnosis present

## 2015-11-25 DIAGNOSIS — L8990 Pressure ulcer of unspecified site, unspecified stage: Secondary | ICD-10-CM | POA: Insufficient documentation

## 2015-11-25 DIAGNOSIS — E1169 Type 2 diabetes mellitus with other specified complication: Secondary | ICD-10-CM | POA: Diagnosis present

## 2015-11-25 DIAGNOSIS — W1830XA Fall on same level, unspecified, initial encounter: Secondary | ICD-10-CM | POA: Diagnosis present

## 2015-11-25 DIAGNOSIS — R140 Abdominal distension (gaseous): Secondary | ICD-10-CM

## 2015-11-25 DIAGNOSIS — R109 Unspecified abdominal pain: Secondary | ICD-10-CM

## 2015-11-25 DIAGNOSIS — G622 Polyneuropathy due to other toxic agents: Secondary | ICD-10-CM | POA: Diagnosis present

## 2015-11-25 DIAGNOSIS — Z833 Family history of diabetes mellitus: Secondary | ICD-10-CM

## 2015-11-25 DIAGNOSIS — M1710 Unilateral primary osteoarthritis, unspecified knee: Secondary | ICD-10-CM | POA: Diagnosis present

## 2015-11-25 DIAGNOSIS — I739 Peripheral vascular disease, unspecified: Secondary | ICD-10-CM | POA: Diagnosis present

## 2015-11-25 DIAGNOSIS — I152 Hypertension secondary to endocrine disorders: Secondary | ICD-10-CM | POA: Diagnosis present

## 2015-11-25 DIAGNOSIS — Z85038 Personal history of other malignant neoplasm of large intestine: Secondary | ICD-10-CM

## 2015-11-25 DIAGNOSIS — M79604 Pain in right leg: Secondary | ICD-10-CM | POA: Diagnosis not present

## 2015-11-25 DIAGNOSIS — I878 Other specified disorders of veins: Secondary | ICD-10-CM | POA: Diagnosis present

## 2015-11-25 DIAGNOSIS — I10 Essential (primary) hypertension: Secondary | ICD-10-CM | POA: Diagnosis present

## 2015-11-25 LAB — CBC WITH DIFFERENTIAL/PLATELET
BASOS ABS: 0 10*3/uL (ref 0.0–0.1)
BASOS PCT: 0 %
EOS ABS: 0 10*3/uL (ref 0.0–0.7)
Eosinophils Relative: 0 %
HEMATOCRIT: 34.1 % — AB (ref 39.0–52.0)
HEMOGLOBIN: 11.8 g/dL — AB (ref 13.0–17.0)
LYMPHS ABS: 1.1 10*3/uL (ref 0.7–4.0)
LYMPHS PCT: 3 %
MCH: 28 pg (ref 26.0–34.0)
MCHC: 34.6 g/dL (ref 30.0–36.0)
MCV: 81 fL (ref 78.0–100.0)
MONOS PCT: 5 %
Monocytes Absolute: 1.8 10*3/uL — ABNORMAL HIGH (ref 0.1–1.0)
NEUTROS ABS: 33.1 10*3/uL — AB (ref 1.7–7.7)
Neutrophils Relative %: 92 %
Platelets: 234 10*3/uL (ref 150–400)
RBC: 4.21 MIL/uL — ABNORMAL LOW (ref 4.22–5.81)
RDW: 13.4 % (ref 11.5–15.5)
WBC: 36 10*3/uL — ABNORMAL HIGH (ref 4.0–10.5)

## 2015-11-25 LAB — URINE MICROSCOPIC-ADD ON

## 2015-11-25 LAB — URINALYSIS, ROUTINE W REFLEX MICROSCOPIC
Glucose, UA: 100 mg/dL — AB
Ketones, ur: 15 mg/dL — AB
LEUKOCYTES UA: NEGATIVE
Nitrite: NEGATIVE
Protein, ur: 30 mg/dL — AB
SPECIFIC GRAVITY, URINE: 1.024 (ref 1.005–1.030)
pH: 5.5 (ref 5.0–8.0)

## 2015-11-25 LAB — COMPREHENSIVE METABOLIC PANEL
ALBUMIN: 3 g/dL — AB (ref 3.5–5.0)
ALT: 26 U/L (ref 17–63)
AST: 54 U/L — AB (ref 15–41)
Alkaline Phosphatase: 66 U/L (ref 38–126)
Anion gap: 12 (ref 5–15)
BUN: 20 mg/dL (ref 6–20)
CHLORIDE: 104 mmol/L (ref 101–111)
CO2: 20 mmol/L — ABNORMAL LOW (ref 22–32)
Calcium: 8.5 mg/dL — ABNORMAL LOW (ref 8.9–10.3)
Creatinine, Ser: 1.25 mg/dL — ABNORMAL HIGH (ref 0.61–1.24)
GFR calc Af Amer: >60 mL/min (ref >60)
GLUCOSE: 280 mg/dL — AB (ref 65–99)
POTASSIUM: 3.6 mmol/L (ref 3.5–5.1)
Sodium: 136 mmol/L (ref 135–145)
Total Bilirubin: 1.5 mg/dL — ABNORMAL HIGH (ref 0.3–1.2)
Total Protein: 5.8 g/dL — ABNORMAL LOW (ref 6.5–8.1)

## 2015-11-25 LAB — I-STAT CG4 LACTIC ACID, ED
LACTIC ACID, VENOUS: 4.8 mmol/L — AB (ref 0.5–1.9)
LACTIC ACID, VENOUS: 3.59 mmol/L — AB (ref 0.5–1.9)

## 2015-11-25 MED ORDER — VANCOMYCIN HCL 10 G IV SOLR
1250.0000 mg | Freq: Two times a day (BID) | INTRAVENOUS | Status: DC
  Administered 2015-11-26 – 2015-11-28 (×5): 1250 mg via INTRAVENOUS
  Filled 2015-11-25 (×5): qty 1250

## 2015-11-25 MED ORDER — VANCOMYCIN HCL IN DEXTROSE 1-5 GM/200ML-% IV SOLN
1000.0000 mg | Freq: Once | INTRAVENOUS | Status: AC
  Administered 2015-11-25: 1000 mg via INTRAVENOUS
  Filled 2015-11-25: qty 200

## 2015-11-25 MED ORDER — MORPHINE SULFATE (PF) 4 MG/ML IV SOLN
4.0000 mg | Freq: Once | INTRAVENOUS | Status: AC
  Administered 2015-11-25: 4 mg via INTRAVENOUS
  Filled 2015-11-25: qty 1

## 2015-11-25 MED ORDER — PIPERACILLIN-TAZOBACTAM 3.375 G IVPB
3.3750 g | Freq: Three times a day (TID) | INTRAVENOUS | Status: DC
  Administered 2015-11-26 – 2015-11-28 (×7): 3.375 g via INTRAVENOUS
  Filled 2015-11-25 (×7): qty 50

## 2015-11-25 MED ORDER — SODIUM CHLORIDE 0.9 % IV BOLUS (SEPSIS)
500.0000 mL | Freq: Once | INTRAVENOUS | Status: AC
Start: 2015-11-25 — End: 2015-11-25
  Administered 2015-11-25: 500 mL via INTRAVENOUS

## 2015-11-25 MED ORDER — SODIUM CHLORIDE 0.9 % IV BOLUS (SEPSIS)
1000.0000 mL | Freq: Once | INTRAVENOUS | Status: AC
  Administered 2015-11-25: 1000 mL via INTRAVENOUS

## 2015-11-25 MED ORDER — SODIUM CHLORIDE 0.9 % IV BOLUS (SEPSIS)
1000.0000 mL | Freq: Once | INTRAVENOUS | Status: AC
Start: 2015-11-25 — End: 2015-11-25
  Administered 2015-11-25: 1000 mL via INTRAVENOUS

## 2015-11-25 MED ORDER — ACETAMINOPHEN 500 MG PO TABS
1000.0000 mg | ORAL_TABLET | Freq: Once | ORAL | Status: AC
  Administered 2015-11-25: 1000 mg via ORAL
  Filled 2015-11-25: qty 2

## 2015-11-25 MED ORDER — PIPERACILLIN-TAZOBACTAM 3.375 G IVPB 30 MIN
3.3750 g | Freq: Once | INTRAVENOUS | Status: AC
  Administered 2015-11-25: 3.375 g via INTRAVENOUS
  Filled 2015-11-25: qty 50

## 2015-11-25 MED ORDER — ONDANSETRON HCL 4 MG/2ML IJ SOLN
4.0000 mg | Freq: Once | INTRAMUSCULAR | Status: AC
  Administered 2015-11-25: 4 mg via INTRAVENOUS
  Filled 2015-11-25: qty 2

## 2015-11-25 MED ORDER — VANCOMYCIN HCL 10 G IV SOLR
1500.0000 mg | INTRAVENOUS | Status: AC
  Administered 2015-11-25: 1500 mg via INTRAVENOUS
  Filled 2015-11-25: qty 1500

## 2015-11-25 NOTE — ED Notes (Signed)
Provider notified of pt request for pain medications and food. Provider acknowledged both and would be placing orders for pain medication.

## 2015-11-25 NOTE — ED Triage Notes (Signed)
Patient presents from home via ems for fall. Fall from standing position, denies LOC, no neck or back pain. Warmth and redness noted to lower right leg. C/o pain to same post fall.  Last VS: 110/68, 92%ra, cbg 248, 126hr.

## 2015-11-25 NOTE — ED Notes (Signed)
Pt lactic acid is 4.80 EDP Little made aware.

## 2015-11-25 NOTE — Progress Notes (Signed)
Pharmacy Antibiotic Note  Brandon Schaefer is a 60 y.o. male admitted on 11/25/2015 with cellulitis of lower right leg.  Patient presents to ED s/p fall.  Patient received Zosyn 3.375gm IV x 1 and Vancomycin 1gm IV x 1 initial dose in the ED.  Pharmacy has been consulted for Vancomycin & Zosyn dosing.  Plan:  Due to patient weight, will give additional Vancomycin 1500mg  IV x 1 dose at the end of the 1gm infusion for a total loading dose of 2500mg .  Then will continue with a regimen of Vancomycin 1250mg  IV q12h  Vancomycin Trough Goal: 10-15 mcg/ml  Zosyn 3.375gm IV q8h (each dose infused over 4 hrs)  Follow cultures & sensitivities  Follow renal function  Check Vancomycin trough level when appropriate  Height: 6' (182.9 cm) Weight: (!) 390 lb (176.9 kg) IBW/kg (Calculated) : 77.6  Temp (24hrs), Avg:100 F (37.8 C), Min:98.9 F (37.2 C), Max:101 F (38.3 C)   Recent Labs Lab 11/25/15 2052 11/25/15 2106  CREATININE 1.25*  --   LATICACIDVEN  --  4.80*    Estimated Creatinine Clearance: 103 mL/min (by C-G formula based on SCr of 1.25 mg/dL).    8/12: CrCl (n) = 63 ml/min  Allergies  Allergen Reactions  . Adhesive [Tape] Rash    Antimicrobials this admission: 8/12 Zosyn >>   8/12 Vanc >>    Dose adjustments this admission:    Microbiology results: 8/12 BCx: sent 8/12 UCx: sent   Thank you for allowing pharmacy to be a part of this patient's care.  Everette Rank, PharmD 11/25/2015 10:19 PM

## 2015-11-25 NOTE — ED Provider Notes (Signed)
[facility] DEPT Provider Note   CSN: [id number] Arrival date & time: 11/25/15  1758  First Provider Contact:  First MD Initiated Contact with Patient 11/25/15 1824        History   Chief Complaint Chief Complaint  Patient presents with  . Fall  . Leg Pain    HPI Brandon Schaefer is a 60 y.o. male.  Patient with PMH of DM, HTN, HL, colon cancer in remission, presents to the ED with a chief complaint of right leg pain.  He states that he noticed swelling and redness of his right lower leg yesterday.  The symptoms have worsened until today.  He reports moderate to severe pain.  States that he has felt very fatigued and weak.  States that he fell today because of the weakness.  He denies known injury, but states that his right lower leg is very painful.  He denies any known fevers, chills, nausea, or vomiting.  His symptoms are worsened with movement and palpation.  There are no other modifying factors.   The history is provided by the patient. No language interpreter was used.    Past Medical History:  Diagnosis Date  . Allergy   . Anemia   . Anxiety   . colon ca dx'd 11/2011  . Diabetes mellitus   . Headache(784.0)   . History of blood transfusion   . Hyperlipidemia   . Hypertension   . Neuromuscular disorder ([place])    peripheral neuropathy  . Shortness of breath    with exertion    Patient Active Problem List   Diagnosis Date Noted  . Bilateral leg edema 08/22/2015  . Dental abscess 08/03/2015  . Back pain 03/20/2015  . Anemia, iron deficiency 05/05/2012  . DM (diabetes mellitus) ([place]) 01/18/2012  . HTN (hypertension) 01/18/2012  . Morbid obesity with body mass index of 60.0-69.9 in adult ([place]) 01/17/2012  . Respiratory failure, post-operative ([place]) 01/17/2012  . Cancer of sigmoid colon ([place]) 12/23/2011  . Arthritis of knee, degenerative 08/09/2011  . Peripheral nerve disease ([place]) 01/01/2006    Past Surgical History:  Procedure Laterality Date  . COMPLEX  WOUND CLOSURE N/A 01/06/2013   Procedure: EXCISION CHRONIC ABDOMINAL WOUND;  Surgeon: Harl Bowie, MD;  Location: [facility];  Service: General;  Laterality: N/A;  . CYSTOSCOPY W/ URETERAL STENT PLACEMENT Bilateral 01/04/2013   Procedure: CYSTOSCOPY WITH RETROGRADE PYELOGRAM/Right double J URETERAL STENT PLACEMENT;  Surgeon: Alexis Frock, MD;  Location: [facility];  Service: Urology;  Laterality: Bilateral;  . CYSTOSCOPY WITH RETROGRADE PYELOGRAM, URETEROSCOPY AND STENT PLACEMENT Right 01/06/2013   Procedure: CYSTOSCOPY WITH RIGHT RETROGRADE PYELOGRAM, URETEROSCOPY AND BILATERAL STENT EXCHANGE, BILATERAL STONE BASKET EXTRACTION ;  Surgeon: Alexis Frock, MD;  Location: [facility];  Service: Urology;  Laterality: Right;  . FRACTURE SURGERY      ORIF-left radius has pin  . HOLMIUM LASER APPLICATION N/A [id number]   Procedure: HOLMIUM LASER APPLICATION;  Surgeon: Alexis Frock, MD;  Location: [facility];  Service: Urology;  Laterality: N/A;  . NEPHROLITHOTOMY Left 01/04/2013   Procedure: NEPHROLITHOTOMY PERCUTANEOUS  LEFT 1ST STAGE PERCUTANEOUS NEPHROSTOLITHOTOMY;  Surgeon: Alexis Frock, MD;  Location: [facility];  Service: Urology;  Laterality: Left;  . NEPHROLITHOTOMY Left 01/06/2013   Procedure: NEPHROLITHOTOMY PERCUTANEOUS SECOND LOOK;  Surgeon: Alexis Frock, MD;  Location: [facility];  Service: Urology;  Laterality: Left;  . PARTIAL COLECTOMY  01/17/2012   Procedure: PARTIAL COLECTOMY;  Surgeon: Harl Bowie, MD;  Location: [facility];  Service: General;;  . PILONIDAL  CYST EXCISION    . PORT-A-CATH REMOVAL Left 01/06/2013   Procedure: REMOVAL PORT-A-CATH;  Surgeon: Harl Bowie, MD;  Location: [facility];  Service: General;  Laterality: Left;  . PORTACATH PLACEMENT  03/19/2012   Procedure: INSERTION PORT-A-CATH;  Surgeon: Harl Bowie, MD;  Location: [facility];  Service: General;  Laterality: N/A;  . PROCTOSCOPY  01/17/2012   Procedure: PROCTOSCOPY;  Surgeon: Harl Bowie, MD;   Location: [facility];  Service: General;;       Home Medications    Prior to Admission medications   Medication Sig Start Date End Date Taking? Authorizing Provider  carvedilol (COREG) 6.25 MG tablet TAKE 1 TABLET BY MOUTH 2 TIMES DAILY, WITH A MEAL 08/09/15   Binnie Rail, MD  clobetasol (TEMOVATE) 0.05 % external solution Apply 1 application topically 2 (two) times daily. Do not use for more than 14 days in a row 03/20/15   Binnie Rail, MD  furosemide (LASIX) 40 MG tablet TAKE 1 TABLET(40 MG) BY MOUTH DAILY 08/22/15   Binnie Rail, MD  insulin aspart protamine- aspart (NOVOLOG MIX 70/30) (70-30) 100 UNIT/ML injection Inject 100 Units into the skin.    Historical Provider, MD  lidocaine (XYLOCAINE) 2 % solution Use as directed 10 mLs in the mouth or throat as needed for mouth pain. 08/03/15   Golden Circle, FNP  linagliptin (TRADJENTA) 5 MG TABS tablet Take 1 tablet (5 mg total) by mouth daily. 08/22/15   Binnie Rail, MD  meloxicam (MOBIC) 15 MG tablet Take 1 tablet (15 mg total) by mouth daily. 08/22/15   Binnie Rail, MD  metFORMIN (GLUCOPHAGE) 1000 MG tablet Take 1,000 mg by mouth daily with breakfast.  07/17/12   Historical Provider, MD  NUCYNTA ER 150 MG TB12 TK 1 T PO Q 12 H 12/11/14   Historical Provider, MD  oxyCODONE (ROXICODONE) 15 MG immediate release tablet TK 1 T PO FOUR TO FIVE TIMES A DAY 12/11/14   Historical Provider, MD  potassium chloride SA (K-DUR,KLOR-CON) 20 MEQ tablet TAKE 1 TABLET(20 MEQ) BY MOUTH DAILY 08/22/15   Binnie Rail, MD  potassium citrate (UROCIT-K) 10 MEQ (1080 MG) SR tablet Take 10 mEq by mouth 2 (two) times daily.  03/17/13   Historical Provider, MD  ramipril (ALTACE) 10 MG capsule TAKE ONE CAPSULE BY MOUTH DAILY BEFORE BREAKFAST 08/22/15   Binnie Rail, MD  rosuvastatin (CRESTOR) 20 MG tablet Take 1 tablet (20 mg total) by mouth daily before breakfast. 08/22/15   Binnie Rail, MD  senna-docusate (SENOKOT-S) 8.6-50 MG per tablet Take 1 tablet by mouth 2 (two) times  daily. While taking pain meds to prevent constipation 01/11/13   Alexis Frock, MD  sertraline (ZOLOFT) 100 MG tablet Take 1.5 tablets (150 mg total) by mouth daily before breakfast. 10/24/15   Binnie Rail, MD    Family History Family History  Problem Relation Age of Onset  . Diabetes Father   . Cancer Maternal Aunt     colon  . Cancer Maternal Grandmother     colon    Social History Social History  Substance Use Topics  . Smoking status: Never Smoker  . Smokeless tobacco: Never Used     Comment: NEVER USED TOBACC0  . Alcohol use No     Allergies   Adhesive [tape]   Review of Systems Review of Systems  Skin: Positive for color change.  All other systems reviewed and are negative.  Physical Exam Updated Vital Signs BP 112/66 (BP Location: Left Arm)   Pulse 119   Temp 101 F (38.3 C) (Oral)   Resp 20   SpO2 93%   Physical Exam  Constitutional: He is oriented to person, place, and time. He appears well-developed and well-nourished.  Morbidly obese  HENT:  Head: Normocephalic and atraumatic.  Eyes: Conjunctivae and EOM are normal. Pupils are equal, round, and reactive to light. Right eye exhibits no discharge. Left eye exhibits no discharge. No scleral icterus.  Neck: Normal range of motion. Neck supple. No JVD present.  Cardiovascular: Normal rate, regular rhythm and normal heart sounds.  Exam reveals no gallop and no friction rub.   No murmur heard. Pulmonary/Chest: Effort normal and breath sounds normal. No respiratory distress. He has no wheezes. He has no rales. He exhibits no tenderness.  Abdominal: Soft. He exhibits no distension and no mass. There is no tenderness. There is no rebound and no guarding.  Musculoskeletal: Normal range of motion. He exhibits no edema or tenderness.  No bony abnormality or deformity throughout  Neurological: He is alert and oriented to person, place, and time.  Skin: Skin is warm and dry.  Right lower extremity cellulitis  extending from the foot to the upper calf, no obvious abscess  Psychiatric: He has a normal mood and affect. His behavior is normal. Judgment and thought content normal.  Nursing note and vitals reviewed.    ED Treatments / Results  Labs (all labs ordered are listed, but only abnormal results are displayed) Labs Reviewed  CULTURE, BLOOD (ROUTINE X 2)  CULTURE, BLOOD (ROUTINE X 2)  URINE CULTURE  COMPREHENSIVE METABOLIC PANEL  CBC WITH DIFFERENTIAL/PLATELET  URINALYSIS, ROUTINE W REFLEX MICROSCOPIC (NOT AT [facility])  I-STAT CG4 LACTIC ACID, ED    EKG  EKG Interpretation None       Radiology No results found.  Procedures Procedures (including critical care time)  Medications Ordered in ED Medications  vancomycin (VANCOCIN) IVPB 1000 mg/200 mL premix (not administered)  sodium chloride 0.9 % bolus 1,000 mL (not administered)    And  sodium chloride 0.9 % bolus 1,000 mL (not administered)    And  sodium chloride 0.9 % bolus 1,000 mL (not administered)    And  sodium chloride 0.9 % bolus 1,000 mL (not administered)    And  sodium chloride 0.9 % bolus 1,000 mL (not administered)    And  sodium chloride 0.9 % bolus 500 mL (not administered)     Initial Impression / Assessment and Plan / ED Course  I have reviewed the triage vital signs and the nursing notes.  Pertinent labs & imaging results that were available during my care of the patient were reviewed by me and considered in my medical decision making (see chart for details).  Clinical Course   Patient meets sepsis criteria.  Will start sepsis orderset.  Patient seen by and discussed with Dr. Rex Kras.  Recommends continuing with full weight based fluids if lactate is greater than 4.  Lactate is 4.8.  Continuing fluids.  Repeat lactate has decreased to 3.59.  His pressures have remained stable.  He is still getting his fluids.   Discussed with Dr. Rex Kras, who recommends consultation with PCCM for elevated lactate  and potential for worsening, but states plan on step-down admission to medicine.  Attempted 3 times to consult with critical care. I do not feel that the patient needs emergent intervention by PCCM immediately.  I feel that the patient  is stable for step-down currently, but it would be nice to get consultation given very high lactate.    12:21 AM  Appreciate Dr. Olevia Bowens for admitting the patient to step-down.  I discussed the situation regarding PCCM consultation with Dr. Olevia Bowens.  He agrees that the patient is stable for step-down at this time.  If something changes, PCCM will be consulted.  Medications  vancomycin (VANCOCIN) 1,250 mg in sodium chloride 0.9 % 250 mL IVPB (not administered)  piperacillin-tazobactam (ZOSYN) IVPB 3.375 g (not administered)  vancomycin (VANCOCIN) IVPB 1000 mg/200 mL premix (0 mg Intravenous Stopped 11/25/15 2150)  sodium chloride 0.9 % bolus 1,000 mL (0 mLs Intravenous Stopped 11/25/15 2122)    And  sodium chloride 0.9 % bolus 1,000 mL (0 mLs Intravenous Stopped 11/25/15 2209)    And  sodium chloride 0.9 % bolus 1,000 mL (0 mLs Intravenous Stopped 11/25/15 2209)    And  sodium chloride 0.9 % bolus 1,000 mL (0 mLs Intravenous Stopped 11/25/15 2257)    And  sodium chloride 0.9 % bolus 1,000 mL (0 mLs Intravenous Stopped 11/25/15 2344)    And  sodium chloride 0.9 % bolus 500 mL (0 mLs Intravenous Stopped 11/25/15 2309)  morphine 4 MG/ML injection 4 mg (4 mg Intravenous Given 11/25/15 2009)  acetaminophen (TYLENOL) tablet 1,000 mg (1,000 mg Oral Given 11/25/15 1943)  piperacillin-tazobactam (ZOSYN) IVPB 3.375 g (0 g Intravenous Stopped 11/25/15 2042)  morphine 4 MG/ML injection 4 mg (4 mg Intravenous Given 11/25/15 2050)  ondansetron (ZOFRAN) injection 4 mg (4 mg Intravenous Given 11/25/15 2049)  vancomycin (VANCOCIN) 1,500 mg in sodium chloride 0.9 % 500 mL IVPB (0 mg Intravenous Stopped 11/25/15 2351)  morphine 4 MG/ML injection 4 mg (4 mg Intravenous Given 11/25/15 2213)    CRITICAL CARE Performed by: Montine Circle   Total critical care time: 45 minutes  Critical care time was exclusive of separately billable procedures and treating other patients.  Critical care was necessary to treat or prevent imminent or life-threatening deterioration.  Critical care was time spent personally by me on the following activities: development of treatment plan with patient and/or surrogate as well as nursing, discussions with consultants, evaluation of patient's response to treatment, examination of patient, obtaining history from patient or surrogate, ordering and performing treatments and interventions, ordering and review of laboratory studies, ordering and review of radiographic studies, pulse oximetry and re-evaluation of patient's condition.   Final Clinical Impressions(s) / ED Diagnoses   Final diagnoses:  Sepsis due to cellulitis [facility])    New Prescriptions New Prescriptions   No medications on file     Montine Circle, PA-C 11/26/15 [place], MD 11/29/15 [phone]

## 2015-11-25 NOTE — ED Notes (Addendum)
IV access attempted x3 all unsuccessful. IV team consult ordered

## 2015-11-25 NOTE — ED Notes (Signed)
Pt lactic acid is 3.59 EDP Little made aware.

## 2015-11-25 NOTE — ED Notes (Signed)
Labs and blood cultures unable to be drawn at this time. Laboratory staff and provider aware.

## 2015-11-25 NOTE — ED Notes (Signed)
Bed: WA07 Expected date:  Expected time:  Means of arrival:  Comments: 60 yo fall

## 2015-11-26 ENCOUNTER — Inpatient Hospital Stay (HOSPITAL_COMMUNITY): Payer: 59

## 2015-11-26 ENCOUNTER — Encounter (HOSPITAL_COMMUNITY): Payer: Self-pay | Admitting: Internal Medicine

## 2015-11-26 DIAGNOSIS — Z6841 Body Mass Index (BMI) 40.0 and over, adult: Secondary | ICD-10-CM | POA: Diagnosis not present

## 2015-11-26 DIAGNOSIS — M179 Osteoarthritis of knee, unspecified: Secondary | ICD-10-CM | POA: Diagnosis present

## 2015-11-26 DIAGNOSIS — B370 Candidal stomatitis: Secondary | ICD-10-CM | POA: Diagnosis present

## 2015-11-26 DIAGNOSIS — Z85038 Personal history of other malignant neoplasm of large intestine: Secondary | ICD-10-CM | POA: Diagnosis not present

## 2015-11-26 DIAGNOSIS — E872 Acidosis, unspecified: Secondary | ICD-10-CM | POA: Diagnosis present

## 2015-11-26 DIAGNOSIS — Z7982 Long term (current) use of aspirin: Secondary | ICD-10-CM | POA: Diagnosis not present

## 2015-11-26 DIAGNOSIS — A419 Sepsis, unspecified organism: Secondary | ICD-10-CM | POA: Diagnosis present

## 2015-11-26 DIAGNOSIS — Z9049 Acquired absence of other specified parts of digestive tract: Secondary | ICD-10-CM | POA: Diagnosis not present

## 2015-11-26 DIAGNOSIS — Z833 Family history of diabetes mellitus: Secondary | ICD-10-CM | POA: Diagnosis not present

## 2015-11-26 DIAGNOSIS — I10 Essential (primary) hypertension: Secondary | ICD-10-CM | POA: Diagnosis present

## 2015-11-26 DIAGNOSIS — W1830XA Fall on same level, unspecified, initial encounter: Secondary | ICD-10-CM | POA: Diagnosis present

## 2015-11-26 DIAGNOSIS — F419 Anxiety disorder, unspecified: Secondary | ICD-10-CM | POA: Diagnosis present

## 2015-11-26 DIAGNOSIS — S82891A Other fracture of right lower leg, initial encounter for closed fracture: Secondary | ICD-10-CM

## 2015-11-26 DIAGNOSIS — E1165 Type 2 diabetes mellitus with hyperglycemia: Secondary | ICD-10-CM | POA: Diagnosis present

## 2015-11-26 DIAGNOSIS — S8251XA Displaced fracture of medial malleolus of right tibia, initial encounter for closed fracture: Secondary | ICD-10-CM | POA: Diagnosis present

## 2015-11-26 DIAGNOSIS — E0865 Diabetes mellitus due to underlying condition with hyperglycemia: Secondary | ICD-10-CM | POA: Diagnosis not present

## 2015-11-26 DIAGNOSIS — L02419 Cutaneous abscess of limb, unspecified: Secondary | ICD-10-CM | POA: Diagnosis present

## 2015-11-26 DIAGNOSIS — Z79899 Other long term (current) drug therapy: Secondary | ICD-10-CM | POA: Diagnosis not present

## 2015-11-26 DIAGNOSIS — E1169 Type 2 diabetes mellitus with other specified complication: Secondary | ICD-10-CM | POA: Diagnosis present

## 2015-11-26 DIAGNOSIS — E785 Hyperlipidemia, unspecified: Secondary | ICD-10-CM | POA: Diagnosis present

## 2015-11-26 DIAGNOSIS — E876 Hypokalemia: Secondary | ICD-10-CM | POA: Diagnosis present

## 2015-11-26 DIAGNOSIS — M79604 Pain in right leg: Secondary | ICD-10-CM | POA: Diagnosis present

## 2015-11-26 DIAGNOSIS — L0390 Cellulitis, unspecified: Secondary | ICD-10-CM

## 2015-11-26 DIAGNOSIS — R9431 Abnormal electrocardiogram [ECG] [EKG]: Secondary | ICD-10-CM

## 2015-11-26 DIAGNOSIS — E1142 Type 2 diabetes mellitus with diabetic polyneuropathy: Secondary | ICD-10-CM | POA: Diagnosis present

## 2015-11-26 DIAGNOSIS — I739 Peripheral vascular disease, unspecified: Secondary | ICD-10-CM | POA: Diagnosis present

## 2015-11-26 DIAGNOSIS — Y92009 Unspecified place in unspecified non-institutional (private) residence as the place of occurrence of the external cause: Secondary | ICD-10-CM | POA: Diagnosis not present

## 2015-11-26 DIAGNOSIS — L03119 Cellulitis of unspecified part of limb: Secondary | ICD-10-CM

## 2015-11-26 DIAGNOSIS — S82841D Displaced bimalleolar fracture of right lower leg, subsequent encounter for closed fracture with routine healing: Secondary | ICD-10-CM | POA: Diagnosis not present

## 2015-11-26 DIAGNOSIS — L03115 Cellulitis of right lower limb: Secondary | ICD-10-CM | POA: Diagnosis present

## 2015-11-26 DIAGNOSIS — E119 Type 2 diabetes mellitus without complications: Secondary | ICD-10-CM | POA: Diagnosis not present

## 2015-11-26 DIAGNOSIS — Z794 Long term (current) use of insulin: Secondary | ICD-10-CM | POA: Diagnosis not present

## 2015-11-26 DIAGNOSIS — I878 Other specified disorders of veins: Secondary | ICD-10-CM | POA: Diagnosis present

## 2015-11-26 HISTORY — DX: Other fracture of right lower leg, initial encounter for closed fracture: S82.891A

## 2015-11-26 LAB — COMPREHENSIVE METABOLIC PANEL
ALBUMIN: 3 g/dL — AB (ref 3.5–5.0)
ALK PHOS: 59 U/L (ref 38–126)
ALT: 32 U/L (ref 17–63)
ANION GAP: 8 (ref 5–15)
AST: 86 U/L — ABNORMAL HIGH (ref 15–41)
BILIRUBIN TOTAL: 1.1 mg/dL (ref 0.3–1.2)
BUN: 22 mg/dL — AB (ref 6–20)
CALCIUM: 7.9 mg/dL — AB (ref 8.9–10.3)
CO2: 23 mmol/L (ref 22–32)
Chloride: 107 mmol/L (ref 101–111)
Creatinine, Ser: 0.97 mg/dL (ref 0.61–1.24)
GFR calc Af Amer: >60 mL/min (ref >60)
GFR calc non Af Amer: >60 mL/min (ref >60)
GLUCOSE: 294 mg/dL — AB (ref 65–99)
Potassium: 4 mmol/L (ref 3.5–5.1)
Sodium: 138 mmol/L (ref 135–145)
TOTAL PROTEIN: 6 g/dL — AB (ref 6.5–8.1)

## 2015-11-26 LAB — CBC WITH DIFFERENTIAL/PLATELET
BASOS ABS: 0 10*3/uL (ref 0.0–0.1)
BASOS PCT: 0 %
EOS ABS: 0 10*3/uL (ref 0.0–0.7)
EOS PCT: 0 %
HCT: 34.1 % — ABNORMAL LOW (ref 39.0–52.0)
Hemoglobin: 11.6 g/dL — ABNORMAL LOW (ref 13.0–17.0)
Lymphocytes Relative: 4 %
Lymphs Abs: 1.4 10*3/uL (ref 0.7–4.0)
MCH: 27.8 pg (ref 26.0–34.0)
MCHC: 34 g/dL (ref 30.0–36.0)
MCV: 81.8 fL (ref 78.0–100.0)
MONO ABS: 1.4 10*3/uL — AB (ref 0.1–1.0)
Monocytes Relative: 4 %
Neutro Abs: 28.3 10*3/uL — ABNORMAL HIGH (ref 1.7–7.7)
Neutrophils Relative %: 91 %
PLATELETS: 232 10*3/uL (ref 150–400)
RBC: 4.17 MIL/uL — ABNORMAL LOW (ref 4.22–5.81)
RDW: 13.8 % (ref 11.5–15.5)
WBC: 31 10*3/uL — ABNORMAL HIGH (ref 4.0–10.5)

## 2015-11-26 LAB — GLUCOSE, CAPILLARY
GLUCOSE-CAPILLARY: 272 mg/dL — AB (ref 65–99)
GLUCOSE-CAPILLARY: 141 mg/dL — AB (ref 65–99)
Glucose-Capillary: 254 mg/dL — ABNORMAL HIGH (ref 65–99)
Glucose-Capillary: 137 mg/dL — ABNORMAL HIGH (ref 65–99)

## 2015-11-26 LAB — MRSA PCR SCREENING: MRSA BY PCR: NEGATIVE

## 2015-11-26 LAB — LACTIC ACID, PLASMA
Lactic Acid, Venous: 3.3 mmol/L (ref 0.5–1.9)
Lactic Acid, Venous: 2.3 mmol/L (ref 0.5–1.9)
Lactic Acid, Venous: 2 mmol/L (ref 0.5–1.9)
Lactic Acid, Venous: 2.2 mmol/L (ref 0.5–1.9)

## 2015-11-26 LAB — ECHOCARDIOGRAM COMPLETE
Height: 72 in
WEIGHTICAEL: 6608 [oz_av]

## 2015-11-26 MED ORDER — MORPHINE SULFATE ER 30 MG PO TBCR
60.0000 mg | EXTENDED_RELEASE_TABLET | Freq: Two times a day (BID) | ORAL | Status: DC
  Administered 2015-11-26 – 2015-12-02 (×14): 60 mg via ORAL
  Filled 2015-11-26 (×14): qty 2

## 2015-11-26 MED ORDER — FAMOTIDINE 20 MG PO TABS
40.0000 mg | ORAL_TABLET | Freq: Two times a day (BID) | ORAL | Status: DC
  Administered 2015-11-26 – 2015-12-02 (×14): 40 mg via ORAL
  Filled 2015-11-26 (×15): qty 2

## 2015-11-26 MED ORDER — OXYCODONE HCL 5 MG PO TABS: 15.0000 mg | ORAL_TABLET | Freq: Four times a day (QID) | ORAL | Status: DC | PRN

## 2015-11-26 MED ORDER — LINAGLIPTIN 5 MG PO TABS
5.0000 mg | ORAL_TABLET | Freq: Every day | ORAL | Status: DC
  Administered 2015-11-26 – 2015-12-02 (×7): 5 mg via ORAL
  Filled 2015-11-26 (×7): qty 1

## 2015-11-26 MED ORDER — ROSUVASTATIN CALCIUM 20 MG PO TABS
20.0000 mg | ORAL_TABLET | Freq: Every day | ORAL | Status: DC
  Administered 2015-11-26 – 2015-12-02 (×7): 20 mg via ORAL
  Filled 2015-11-26 (×7): qty 1

## 2015-11-26 MED ORDER — CARVEDILOL 6.25 MG PO TABS
6.2500 mg | ORAL_TABLET | Freq: Two times a day (BID) | ORAL | Status: DC
  Administered 2015-11-26 – 2015-12-02 (×12): 6.25 mg via ORAL
  Filled 2015-11-26 (×13): qty 1

## 2015-11-26 MED ORDER — KETOROLAC TROMETHAMINE 30 MG/ML IJ SOLN
30.0000 mg | Freq: Once | INTRAMUSCULAR | Status: AC
  Administered 2015-11-26: 30 mg via INTRAVENOUS
  Filled 2015-11-26: qty 1

## 2015-11-26 MED ORDER — SERTRALINE HCL 50 MG PO TABS
150.0000 mg | ORAL_TABLET | Freq: Every day | ORAL | Status: DC
  Administered 2015-11-26 – 2015-12-02 (×7): 150 mg via ORAL
  Filled 2015-11-26 (×7): qty 1

## 2015-11-26 MED ORDER — ENOXAPARIN SODIUM 100 MG/ML ~~LOC~~ SOLN
90.0000 mg | SUBCUTANEOUS | Status: DC
  Administered 2015-11-26 – 2015-12-02 (×7): 90 mg via SUBCUTANEOUS
  Filled 2015-11-26 (×7): qty 1

## 2015-11-26 MED ORDER — INSULIN LISPRO PROT & LISPRO (75-25 MIX) 100 UNIT/ML ~~LOC~~ SUSP: 50.0000 [IU] | Freq: Two times a day (BID) | SUBCUTANEOUS | Status: DC

## 2015-11-26 MED ORDER — ONDANSETRON HCL 4 MG PO TABS: 4.0000 mg | ORAL_TABLET | Freq: Four times a day (QID) | ORAL | Status: DC | PRN

## 2015-11-26 MED ORDER — INSULIN ASPART PROT & ASPART (70-30 MIX) 100 UNIT/ML ~~LOC~~ SUSP
50.0000 [IU] | Freq: Every day | SUBCUTANEOUS | Status: DC
  Administered 2015-11-26: 50 [IU] via SUBCUTANEOUS

## 2015-11-26 MED ORDER — POTASSIUM CHLORIDE IN NACL 20-0.9 MEQ/L-% IV SOLN
INTRAVENOUS | Status: DC
  Administered 2015-11-26 – 2015-11-27 (×4): via INTRAVENOUS
  Filled 2015-11-26 (×4): qty 1000

## 2015-11-26 MED ORDER — MORPHINE SULFATE (PF) 4 MG/ML IV SOLN
6.0000 mg | Freq: Once | INTRAVENOUS | Status: AC
  Administered 2015-11-26: 6 mg via INTRAVENOUS
  Filled 2015-11-26: qty 2

## 2015-11-26 MED ORDER — ONDANSETRON HCL 4 MG/2ML IJ SOLN: 4.0000 mg | Freq: Four times a day (QID) | INTRAMUSCULAR | Status: DC | PRN

## 2015-11-26 MED ORDER — MELOXICAM 15 MG PO TABS
15.0000 mg | ORAL_TABLET | Freq: Every day | ORAL | Status: DC
  Administered 2015-11-26 – 2015-12-02 (×7): 15 mg via ORAL
  Filled 2015-11-26 (×7): qty 1

## 2015-11-26 MED ORDER — INSULIN ASPART PROT & ASPART (70-30 MIX) 100 UNIT/ML ~~LOC~~ SUSP
100.0000 [IU] | Freq: Every day | SUBCUTANEOUS | Status: DC
  Administered 2015-11-26 – 2015-11-27 (×2): 100 [IU] via SUBCUTANEOUS
  Filled 2015-11-26: qty 10

## 2015-11-26 MED ORDER — POTASSIUM CHLORIDE CRYS ER 20 MEQ PO TBCR
20.0000 meq | EXTENDED_RELEASE_TABLET | Freq: Every day | ORAL | Status: DC
  Administered 2015-11-26 – 2015-12-02 (×7): 20 meq via ORAL
  Filled 2015-11-26 (×7): qty 1

## 2015-11-26 MED ORDER — SODIUM CHLORIDE 0.9% FLUSH
3.0000 mL | Freq: Two times a day (BID) | INTRAVENOUS | Status: DC
  Administered 2015-11-26 – 2015-12-02 (×8): 3 mL via INTRAVENOUS

## 2015-11-26 MED ORDER — SENNOSIDES-DOCUSATE SODIUM 8.6-50 MG PO TABS: 1.0000 | ORAL_TABLET | Freq: Two times a day (BID) | ORAL | Status: DC | PRN

## 2015-11-26 MED ORDER — RAMIPRIL 10 MG PO CAPS
10.0000 mg | ORAL_CAPSULE | Freq: Every day | ORAL | Status: DC
  Administered 2015-11-27 – 2015-12-02 (×6): 10 mg via ORAL
  Filled 2015-11-26 (×7): qty 1

## 2015-11-26 MED ORDER — OXYCODONE HCL 5 MG PO TABS
15.0000 mg | ORAL_TABLET | ORAL | Status: DC | PRN
  Administered 2015-11-26 – 2015-12-02 (×28): 15 mg via ORAL
  Filled 2015-11-26 (×29): qty 3

## 2015-11-26 MED ORDER — ASPIRIN EC 81 MG PO TBEC
81.0000 mg | DELAYED_RELEASE_TABLET | Freq: Every day | ORAL | Status: DC
Start: 2015-11-26 — End: 2015-12-02
  Administered 2015-11-26 – 2015-12-02 (×7): 81 mg via ORAL
  Filled 2015-11-26 (×7): qty 1

## 2015-11-26 MED ORDER — INSULIN ASPART 100 UNIT/ML ~~LOC~~ SOLN
0.0000 [IU] | Freq: Three times a day (TID) | SUBCUTANEOUS | Status: DC
Start: 2015-11-26 — End: 2015-12-02
  Administered 2015-11-26: 2 [IU] via SUBCUTANEOUS
  Administered 2015-11-26: 8 [IU] via SUBCUTANEOUS
  Administered 2015-11-27 (×2): 3 [IU] via SUBCUTANEOUS
  Administered 2015-11-27: 5 [IU] via SUBCUTANEOUS
  Administered 2015-11-28: 3 [IU] via SUBCUTANEOUS
  Administered 2015-11-28: 5 [IU] via SUBCUTANEOUS
  Administered 2015-11-28 – 2015-12-01 (×6): 2 [IU] via SUBCUTANEOUS

## 2015-11-26 NOTE — Progress Notes (Signed)
Orthopedics Progress Note  Subjective: Patient reports some pain associated with the right ankle  Objective:  Vitals:   11/26/15 1237 11/26/15 2207  BP: 121/63 (!) 102/50  Pulse: 88 96  Resp: 18   Temp: 98 F (36.7 C) 99.9 F (37.7 C)    General: Awake and alert  Musculoskeletal: erythema seems to be getting better , minimally tender over the medial malleolus, non tender over the lateral ankle and also over the midfoot. 2-3 +edema noted below the knee on the right leg Neurovascularly intact  Lab Results  Component Value Date   WBC 31.0 (H) 11/26/2015   HGB 11.6 (L) 11/26/2015   HCT 34.1 (L) 11/26/2015   MCV 81.8 11/26/2015   PLT 232 11/26/2015       Component Value Date/Time   NA 138 11/26/2015 0526   NA 142 06/21/2015 0828   K 4.0 11/26/2015 0526   K 4.2 06/21/2015 0828   CL 107 11/26/2015 0526   CL 98 12/22/2014 0751   CO2 23 11/26/2015 0526   CO2 25 06/21/2015 0828   GLUCOSE 294 (H) 11/26/2015 0526   GLUCOSE 129 06/21/2015 0828   GLUCOSE 314 (H) 12/22/2014 0751   BUN 22 (H) 11/26/2015 0526   BUN 11.7 06/21/2015 0828   CREATININE 0.97 11/26/2015 0526   CREATININE 0.9 06/21/2015 0828   CALCIUM 7.9 (L) 11/26/2015 0526   CALCIUM 9.0 06/21/2015 0828   GFRNONAA >60 11/26/2015 0526   GFRAA >60 11/26/2015 0526    No results found for: INR, PROTIME  Assessment/Plan: Patient with severe edema and superimposed cellulitis of the right leg.  He also has a non-displaced medial malleolus fracture (essentially a sprain of the ankle) At this time there is too high of a risk to apply a short leg splint to the leg and we will have to wait until that swelling comes down prior to splinting.  He will need to place minimal weight on the leg at this time.  May consider ACE wrap for some support, but his skin seems to be getting better quickly which opens up options for immobilization  Doran Heater. Veverly Fells, MD 11/26/2015 10:09 PM

## 2015-11-26 NOTE — Progress Notes (Signed)
CRITICAL VALUE ALERT  Critical value received:  Lactic acid 2.3  Date of notification:  11/26/15  Time of notification:  0600  Critical value read back:Yes.    Nurse who received alert:  Reynold Bowen, RN  MD notified (1st page):  D. Crosley  Time of first page: 0602  MD notified (2nd page):  Time of second page:  Responding MD:  n/a  Time MD responded: n/a

## 2015-11-26 NOTE — Progress Notes (Signed)
PROGRESS NOTE                                                                                                                                                                                                             Patient Demographics:    Brandon Schaefer, is a 60 y.o. male, DOB - 1955-10-30, [id number]  Admit date - 11/25/2015   Admitting Physician Reubin Milan, MD  Outpatient Primary MD for the patient is Binnie Rail, MD  LOS - 0  Outpatient Specialists: None  Chief Complaint  Patient presents with  . Fall  . Leg Pain       Brief Narrative   60 year old morbidly obese male with history of type 2 diabetes mellitus, peripheral neuropathy, hypertension anxiety, history of colon cancer brought to the ED by EMS after falling at home. For past 1 day he had noticed right lower leg swelling with tenderness and erythema. Denied any trauma, fevers, chills, headache, shortness of breath, chest pain, abdominal pain bowel or urinary symptoms. He informs having nausea on the morning of admission and unable to take much by mouth. He had 2 episodes of vomiting earlier during the day as well. In the ED he was septic with fever of 101F, tachypneic, soft blood pressure, leukocytosis of 36K and elevated lactic acid >4. Patient was found to have cellulitis of his right leg with x-ray showing avulsion fracture of the medial malleolus. Admitted to stepdown unit. Sepsis pathway initiated on admission.    Subjective:   Patient reports some pain in his right leg. Afebrile this morning   Assessment  & Plan :    Principal Problem:   Sepsis ([place]) Suspect due to right lower leg cellulitis. Now resolving. Still has leukocytosis impacted trending towards normal. Empiric vancomycin and Zosyn. Follow cultures.   Active Problems: Right lower leg cellulitis with swelling Empiric antibiotics. Check Doppler to rule out DVT. Pain  control with oxycodone.  Right medial malleolus fracture Secondary to fall. Seen by orthopedics. Recommend no surgical intervention. Once infection cleared it would need either lower leg cast or a cam boot to allow for complete healing (8-10 weeks). Continue pain medications.    uncontrolled DM (diabetes mellitus) ([place]) Continue home dose Lantus with sliding scale coverage. continue tradjenta.  Hypokalemia Replenished  Peripheral vascular disease Continue aspirin, statin.   Essential  hypertension Stable. Resume home medications   Stable for transfer to medical floor.    Code Status : Full code   Family Communication  :None at bedside  Disposition Plan  :Transfer to medical floor.  Barriers For Discharge : Active symptoms.  Consults  :  [facility]  Procedures  : Doppler right lower leg extending  DVT Prophylaxis  : Lovenox  Lab Results  Component Value Date   PLT 232 11/26/2015    Antibiotics  :   Anti-infectives    Start     Dose/Rate Route Frequency Ordered Stop   11/26/15 0900  vancomycin (VANCOCIN) 1,250 mg in sodium chloride 0.9 % 250 mL IVPB     1,250 mg 166.7 mL/hr over 90 Minutes Intravenous Every 12 hours 11/25/15 2218     11/26/15 0200  piperacillin-tazobactam (ZOSYN) IVPB 3.375 g     3.375 g 12.5 mL/hr over 240 Minutes Intravenous Every 8 hours 11/25/15 2218     11/25/15 2200  vancomycin (VANCOCIN) 1,500 mg in sodium chloride 0.9 % 500 mL IVPB     1,500 mg 250 mL/hr over 120 Minutes Intravenous STAT 11/25/15 2135 11/25/15 2351   11/25/15 1900  piperacillin-tazobactam (ZOSYN) IVPB 3.375 g     3.375 g 100 mL/hr over 30 Minutes Intravenous  Once 11/25/15 1852 11/25/15 2042   11/25/15 1845  vancomycin (VANCOCIN) IVPB 1000 mg/200 mL premix     1,000 mg 200 mL/hr over 60 Minutes Intravenous  Once 11/25/15 1841 11/25/15 2150        Objective:   Vitals:   11/26/15 0900 11/26/15 1000 11/26/15 1100 11/26/15 1237  BP: (!) 113/55 117/65  118/64 121/63  Pulse: 80 87 80 88  Resp: (!) 21 20 20 18   Temp:    98 F (36.7 C)  TempSrc:    Oral  SpO2: 96% 95% 96% 98%  Weight:      Height:        Wt Readings from Last 3 Encounters:  11/26/15 (!) 187.3 kg (413 lb)  08/22/15 (!) 186.4 kg (411 lb)  08/03/15 (!) 179.2 kg (395 lb)     Intake/Output Summary (Last 24 hours) at 11/26/15 1327 Last data filed at 11/26/15 1100  Gross per 24 hour  Intake             7585 ml  Output              425 ml  Net             7160 ml     Physical Exam  [id number] obese male not in distress  HEENT: no pallor, moist mucosa, supple neck Chest: clear b/l, no added sounds CVS: N S1&S2, no murmurs, rubs or gallop GI: soft, NT, ND, BS+ Musculoskeletal:Swelling over right leg with erythema involving if in area of the right calf. Swelling with some erythema over the right ankle medially. Tender to pressure with limited AROM  [id number] and oriented   Data Review:    CBC  Recent Labs Lab 11/25/15 2239 11/26/15 0526  WBC 36.0* 31.0*  HGB 11.8* 11.6*  HCT 34.1* 34.1*  PLT 234 232  MCV 81.0 81.8  MCH 28.0 27.8  MCHC 34.6 34.0  RDW 13.4 13.8  LYMPHSABS 1.1 1.4  MONOABS 1.8* 1.4*  EOSABS 0.0 0.0  BASOSABS 0.0 0.0    Chemistries   Recent Labs Lab 11/25/15 2052 11/26/15 0526  NA 136 138  K 3.6 4.0  CL 104 107  CO2 20* 23  GLUCOSE 280* 294*  BUN 20 22*  CREATININE 1.25* 0.97  CALCIUM 8.5* 7.9*  AST 54* 86*  ALT 26 32  ALKPHOS 66 59  BILITOT 1.5* 1.1   ------------------------------------------------------------------------------------------------------------------ No results for input(s): CHOL, HDL, LDLCALC, TRIG, CHOLHDL, LDLDIRECT in the last 72 hours.  No results found for: HGBA1C ------------------------------------------------------------------------------------------------------------------ No results for input(s): TSH, T4TOTAL, T3FREE, THYROIDAB in the last 72 hours.  Invalid input(s):  FREET3 ------------------------------------------------------------------------------------------------------------------ No results for input(s): VITAMINB12, FOLATE, FERRITIN, TIBC, IRON, RETICCTPCT in the last 72 hours.  Coagulation profile No results for input(s): INR, PROTIME in the last 168 hours.  No results for input(s): DDIMER in the last 72 hours.  Cardiac Enzymes No results for input(s): CKMB, TROPONINI, MYOGLOBIN in the last 168 hours.  Invalid input(s): CK ------------------------------------------------------------------------------------------------------------------ No results found for: BNP  Inpatient Medications  Scheduled Meds: . aspirin EC  81 mg Oral Daily  . carvedilol  6.25 mg Oral BID WC  . enoxaparin (LOVENOX) injection  90 mg Subcutaneous Q24H  . famotidine  40 mg Oral BID  . insulin aspart  0-15 Units Subcutaneous TID WC  . insulin aspart protamine- aspart  100 Units Subcutaneous Q breakfast  . insulin aspart protamine- aspart  50 Units Subcutaneous Q supper  . linagliptin  5 mg Oral Daily  . meloxicam  15 mg Oral Daily  . morphine  60 mg Oral Q12H  . piperacillin-tazobactam (ZOSYN)  IV  3.375 g Intravenous Q8H  . potassium chloride SA  20 mEq Oral Daily  . ramipril  10 mg Oral QAC breakfast  . rosuvastatin  20 mg Oral QAC breakfast  . sertraline  150 mg Oral QAC breakfast  . sodium chloride flush  3 mL Intravenous Q12H  . vancomycin  1,250 mg Intravenous Q12H   Continuous Infusions: . 0.9 % NaCl with KCl 20 mEq / L 125 mL/hr at 11/26/15 1029   PRN Meds:.ondansetron **OR** ondansetron (ZOFRAN) IV, oxyCODONE, senna-docusate  Micro Results Recent Results (from the past 240 hour(s))  Blood Culture (routine x 2)     Status: None (Preliminary result)   Collection Time: 11/25/15  8:39 PM  Result Value Ref Range Status   Specimen Description BLOOD RIGHT HAND  Final   Special Requests BOTTLES DRAWN AEROBIC AND ANAEROBIC 5CC  Final   Culture   Final     NO GROWTH < 24 HOURS Performed at [facility]    Report Status PENDING  Incomplete  Blood Culture (routine x 2)     Status: None (Preliminary result)   Collection Time: 11/25/15  8:53 PM  Result Value Ref Range Status   Specimen Description BLOOD LEFT HAND  Final   Special Requests BOTTLES DRAWN AEROBIC ONLY 5CC  Final   Culture   Final    NO GROWTH < 24 HOURS Performed at [facility]    Report Status PENDING  Incomplete  MRSA PCR Screening     Status: None   Collection Time: 11/26/15  2:14 AM  Result Value Ref Range Status   MRSA by PCR NEGATIVE NEGATIVE Final    Comment:        The GeneXpert MRSA Assay (FDA approved for NASAL specimens only), is one component of a comprehensive MRSA colonization surveillance program. It is not intended to diagnose MRSA infection nor to guide or monitor treatment for MRSA infections.     Radiology Reports Dg Tibia/fibula Right  Result Date: 11/25/2015 CLINICAL DATA:  60 year old male who fell from standing. Right lower  extremity erythema and pain. Cellulitis suspected. Initial encounter. EXAM: RIGHT TIBIA AND FIBULA - 2 VIEW COMPARISON:  None. FINDINGS: Partially visible advanced degenerative changes at the right knee. Severe medial compartment joint space loss. Proximal tibia appears intact. Proximal fibula appears intact. Right tibia and fibula shafts appear intact. Distal right fibula appears intact, but there is a minimally displaced fracture of the anterior medial malleolus (arrows). Right mortise joint alignment appears preserved on these images. Grossly intact talus and calcaneus with degenerative spurring. Diffuse soft tissue swelling and stranding compatible with widespread soft tissue inflammation. No subcutaneous gas. IMPRESSION: 1. Minimally displaced fracture of the right medial malleolus. Consider follow-up dedicated right ankle series. 2. No other acute osseous abnormality identified in the right tib-fib.  Degenerative changes at the knee and ankle. 3. Diffuse soft tissue swelling.  No subcutaneous gas. Electronically Signed   By: Genevie Ann M.D.   On: 11/25/2015 19:14   Dg Ankle Complete Right  Result Date: 11/25/2015 CLINICAL DATA:  60 year old male who fell from standing with evidence of medial malleolus fracture on tib-fib series today. Initial encounter. EXAM: RIGHT ANKLE - COMPLETE 3+ VIEW COMPARISON:  Right tib-fib series 18 41 hours today. FINDINGS: Diffuse soft tissue swelling about the visible right lower extremity. Mortise joint alignment preserved. Small curvilinear fracture fragment along the anterior medial malleolus re - demonstrated and appears stable from the earlier exam. Talar dome intact. Distal fibula intact. Calcaneus intact with bulky degenerative spurring and dystrophic calcification. Dorsal midfoot degenerative spurring. No other No acute osseous abnormality identified. IMPRESSION: 1. Small curvilinear, probably avulsion type, fracture fragment along the anterior medial malleolus. 2. Right ankle otherwise intact. 3. Diffuse right lower extremities soft tissue swelling. Electronically Signed   By: Genevie Ann M.D.   On: 11/25/2015 19:56   Dg Chest Port 1 View  Result Date: 11/25/2015 CLINICAL DATA:  Fever and shortness of breath. Recent fall. Colon carcinoma. EXAM: PORTABLE CHEST 1 VIEW COMPARISON:  03/19/2012 FINDINGS: The heart size and mediastinal contours are within normal limits. Both lungs are clear. The visualized skeletal structures are unremarkable. IMPRESSION: No active disease. Electronically Signed   By: Earle Gell M.D.   On: 11/25/2015 19:10    Time Spent in minutes  25   Louellen Molder M.D on 11/26/2015 at 1:27 PM  Between 7am to 7pm - Pager - [phone]  After 7pm go to [email or web address] - password [facility]  [facility] -  Office  [phone]

## 2015-11-26 NOTE — Consult Note (Signed)
Brandon Schaefer is an 60 y.o. male.    Chief Complaint: right ankle pain s/p fall  HPI: 60 y/o male admitted for weakness and sepsis due to cellulitis right lower extremity. Pt c/o weakness and dyspnea yesterday. He sustained a ground level fall at home. C/o mild to moderate pain to medial right ankle. Pain with ambulation and movement of the right ankle. Denies any previous problems or issues with the ankle in the past. Currently in ICU for IV antibiotics and monitoring.  PCP:  Binnie Rail, MD  PMH: Past Medical History:  Diagnosis Date  . Allergy   . Anemia   . Anxiety   . colon ca dx'd 11/2011  . Diabetes mellitus   . Headache(784.0)   . History of blood transfusion   . Hyperlipidemia   . Hypertension   . Neuromuscular disorder ([place])    peripheral neuropathy  . Shortness of breath    with exertion    PSH: Past Surgical History:  Procedure Laterality Date  . COMPLEX WOUND CLOSURE N/A 01/06/2013   Procedure: EXCISION CHRONIC ABDOMINAL WOUND;  Surgeon: Harl Bowie, MD;  Location: [facility];  Service: General;  Laterality: N/A;  . CYSTOSCOPY W/ URETERAL STENT PLACEMENT Bilateral 01/04/2013   Procedure: CYSTOSCOPY WITH RETROGRADE PYELOGRAM/Right double J URETERAL STENT PLACEMENT;  Surgeon: Alexis Frock, MD;  Location: [facility];  Service: Urology;  Laterality: Bilateral;  . CYSTOSCOPY WITH RETROGRADE PYELOGRAM, URETEROSCOPY AND STENT PLACEMENT Right 01/06/2013   Procedure: CYSTOSCOPY WITH RIGHT RETROGRADE PYELOGRAM, URETEROSCOPY AND BILATERAL STENT EXCHANGE, BILATERAL STONE BASKET EXTRACTION ;  Surgeon: Alexis Frock, MD;  Location: [facility];  Service: Urology;  Laterality: Right;  . FRACTURE SURGERY      ORIF-left radius has pin  . HOLMIUM LASER APPLICATION N/A 2/44/0102   Procedure: HOLMIUM LASER APPLICATION;  Surgeon: Alexis Frock, MD;  Location: [facility];  Service: Urology;  Laterality: N/A;  . NEPHROLITHOTOMY Left 01/04/2013   Procedure: NEPHROLITHOTOMY PERCUTANEOUS  LEFT  1ST STAGE PERCUTANEOUS NEPHROSTOLITHOTOMY;  Surgeon: Alexis Frock, MD;  Location: [facility];  Service: Urology;  Laterality: Left;  . NEPHROLITHOTOMY Left 01/06/2013   Procedure: NEPHROLITHOTOMY PERCUTANEOUS SECOND LOOK;  Surgeon: Alexis Frock, MD;  Location: [facility];  Service: Urology;  Laterality: Left;  . PARTIAL COLECTOMY  01/17/2012   Procedure: PARTIAL COLECTOMY;  Surgeon: Harl Bowie, MD;  Location: [facility];  Service: General;;  . PILONIDAL CYST EXCISION    . PORT-A-CATH REMOVAL Left 01/06/2013   Procedure: REMOVAL PORT-A-CATH;  Surgeon: Harl Bowie, MD;  Location: [facility];  Service: General;  Laterality: Left;  . PORTACATH PLACEMENT  03/19/2012   Procedure: INSERTION PORT-A-CATH;  Surgeon: Harl Bowie, MD;  Location: [facility];  Service: General;  Laterality: N/A;  . PROCTOSCOPY  01/17/2012   Procedure: PROCTOSCOPY;  Surgeon: Harl Bowie, MD;  Location: [facility];  Service: General;;    Social History:  reports that he has never smoked. He has never used smokeless tobacco. He reports that he does not drink alcohol or use drugs.  Allergies:  Allergies  Allergen Reactions  . Adhesive [Tape] Rash    Medications: Current Facility-Administered Medications  Medication Dose Route Frequency Provider Last Rate Last Dose  . 0.9 % NaCl with KCl 20 mEq/ L  infusion   Intravenous Continuous Reubin Milan, MD 125 mL/hr at 11/26/15 0257    . aspirin EC tablet 81 mg  81 mg Oral Daily Reubin Milan, MD      .  carvedilol (COREG) tablet 6.25 mg  6.25 mg Oral BID WC Reubin Milan, MD      . enoxaparin (LOVENOX) injection 90 mg  90 mg Subcutaneous Q24H Reubin Milan, MD      . famotidine (PEPCID) tablet 40 mg  40 mg Oral BID Reubin Milan, MD   40 mg at 11/26/15 0256  . insulin aspart (novoLOG) injection 0-15 Units  0-15 Units Subcutaneous TID WC Reubin Milan, MD      . insulin aspart protamine- aspart (NOVOLOG MIX 70/30) injection  100 Units  100 Units Subcutaneous Q breakfast Reubin Milan, MD      . insulin aspart protamine- aspart (NOVOLOG MIX 70/30) injection 50 Units  50 Units Subcutaneous Q supper Reubin Milan, MD      . linagliptin [facility]) tablet 5 mg  5 mg Oral Daily Reubin Milan, MD      . meloxicam [facility]) tablet 15 mg  15 mg Oral Daily Reubin Milan, MD      . morphine (MS CONTIN) 12 hr tablet 60 mg  60 mg Oral Q12H Reubin Milan, MD   60 mg at 11/26/15 0257  . ondansetron (ZOFRAN) tablet 4 mg  4 mg Oral Q6H PRN Reubin Milan, MD       Or  . ondansetron [facility]) injection 4 mg  4 mg Intravenous Q6H PRN Reubin Milan, MD      . oxyCODONE (Oxy IR/ROXICODONE) immediate release tablet 15 mg  15 mg Oral Q4H PRN Quintella Baton, MD   15 mg at 11/26/15 0531  . piperacillin-tazobactam (ZOSYN) IVPB 3.375 g  3.375 g Intravenous Q8H Leann T Poindexter, RPH   3.375 g at 11/26/15 0258  . potassium chloride SA (K-DUR,KLOR-CON) CR tablet 20 mEq  20 mEq Oral Daily Reubin Milan, MD      . ramipril (ALTACE) capsule 10 mg  10 mg Oral QAC breakfast Reubin Milan, MD   Stopped at 11/26/15 0800  . rosuvastatin (CRESTOR) tablet 20 mg  20 mg Oral QAC breakfast Reubin Milan, MD   20 mg at 11/26/15 0732  . senna-docusate (Senokot-S) tablet 1 tablet  1 tablet Oral BID PRN Reubin Milan, MD      . sertraline (ZOLOFT) tablet 150 mg  150 mg Oral QAC breakfast Reubin Milan, MD   150 mg at 11/26/15 0732  . sodium chloride flush (NS) 0.9 % injection 3 mL  3 mL Intravenous Q12H Reubin Milan, MD      . vancomycin (VANCOCIN) 1,250 mg in sodium chloride 0.9 % 250 mL IVPB  1,250 mg Intravenous Q12H Nilda Simmer, RPH        Results for orders placed or performed during the hospital encounter of 11/25/15 (from the past 48 hour(s))  Comprehensive metabolic panel     Status: Abnormal   Collection Time: 11/25/15  8:52 PM  Result Value Ref Range   Sodium 136 135 - 145 mmol/L     Potassium 3.6 3.5 - 5.1 mmol/L   Chloride 104 101 - 111 mmol/L   CO2 20 (L) 22 - 32 mmol/L   Glucose, Bld 280 (H) 65 - 99 mg/dL   BUN 20 6 - 20 mg/dL   Creatinine, Ser 1.25 (H) 0.61 - 1.24 mg/dL   Calcium 8.5 (L) 8.9 - 10.3 mg/dL   Total Protein 5.8 (L) 6.5 - 8.1 g/dL   Albumin 3.0 (L) 3.5 - 5.0 g/dL   AST  54 (H) 15 - 41 U/L   ALT 26 17 - 63 U/L   Alkaline Phosphatase 66 38 - 126 U/L   Total Bilirubin 1.5 (H) 0.3 - 1.2 mg/dL   GFR calc non Af Amer >60 >60 mL/min   GFR calc Af Amer >60 >60 mL/min    Comment: (NOTE) The eGFR has been calculated using the CKD EPI equation. This calculation has not been validated in all clinical situations. eGFR's persistently <60 mL/min signify possible Chronic Kidney Disease.    Anion gap 12 5 - 15  I-Stat CG4 Lactic Acid, ED  (not at  [facility])     Status: Abnormal   Collection Time: 11/25/15  9:06 PM  Result Value Ref Range   Lactic Acid, Venous 4.80 (HH) 0.5 - 1.9 mmol/L   Comment NOTIFIED PHYSICIAN   Urinalysis, Routine w reflex microscopic (not at [facility])     Status: Abnormal   Collection Time: 11/25/15  9:43 PM  Result Value Ref Range   Color, Urine AMBER (A) YELLOW    Comment: BIOCHEMICALS MAY BE AFFECTED BY COLOR   APPearance CLOUDY (A) CLEAR   Specific Gravity, Urine 1.024 1.005 - 1.030   pH 5.5 5.0 - 8.0   Glucose, UA 100 (A) NEGATIVE mg/dL   Hgb urine dipstick LARGE (A) NEGATIVE   Bilirubin Urine SMALL (A) NEGATIVE   Ketones, ur 15 (A) NEGATIVE mg/dL   Protein, ur 30 (A) NEGATIVE mg/dL   Nitrite NEGATIVE NEGATIVE   Leukocytes, UA NEGATIVE NEGATIVE  Urine microscopic-add on     Status: Abnormal   Collection Time: 11/25/15  9:43 PM  Result Value Ref Range   Squamous Epithelial / LPF 6-30 (A) NONE SEEN   WBC, UA 0-5 0 - 5 WBC/hpf   RBC / HPF 6-30 0 - 5 RBC/hpf   Bacteria, UA FEW (A) NONE SEEN   Casts GRANULAR CAST (A) NEGATIVE   Urine-Other MUCOUS PRESENT     Comment: AMORPHOUS URATES/PHOSPHATES  CBC with Differential/Platelet      Status: Abnormal   Collection Time: 11/25/15 10:39 PM  Result Value Ref Range   WBC 36.0 (H) 4.0 - 10.5 K/uL   RBC 4.21 (L) 4.22 - 5.81 MIL/uL   Hemoglobin 11.8 (L) 13.0 - 17.0 g/dL   HCT 34.1 (L) 39.0 - 52.0 %   MCV 81.0 78.0 - 100.0 fL   MCH 28.0 26.0 - 34.0 pg   MCHC 34.6 30.0 - 36.0 g/dL   RDW 13.4 11.5 - 15.5 %   Platelets 234 150 - 400 K/uL   Neutrophils Relative % 92 %   Lymphocytes Relative 3 %   Monocytes Relative 5 %   Eosinophils Relative 0 %   Basophils Relative 0 %   Neutro Abs 33.1 (H) 1.7 - 7.7 K/uL   Lymphs Abs 1.1 0.7 - 4.0 K/uL   Monocytes Absolute 1.8 (H) 0.1 - 1.0 K/uL   Eosinophils Absolute 0.0 0.0 - 0.7 K/uL   Basophils Absolute 0.0 0.0 - 0.1 K/uL   WBC Morphology WHITE COUNT CONFIRMED ON SMEAR     Comment: MILD LEFT SHIFT (1-5% METAS, OCC MYELO, OCC BANDS) TOXIC GRANULATION   I-Stat CG4 Lactic Acid, ED  (not at  [facility])     Status: Abnormal   Collection Time: 11/25/15 11:07 PM  Result Value Ref Range   Lactic Acid, Venous 3.59 (HH) 0.5 - 1.9 mmol/L   Comment NOTIFIED PHYSICIAN   MRSA PCR Screening     Status: None   Collection  Time: 11/26/15  2:14 AM  Result Value Ref Range   MRSA by PCR NEGATIVE NEGATIVE    Comment:        The GeneXpert MRSA Assay (FDA approved for NASAL specimens only), is one component of a comprehensive MRSA colonization surveillance program. It is not intended to diagnose MRSA infection nor to guide or monitor treatment for MRSA infections.   Lactic acid, plasma     Status: Abnormal   Collection Time: 11/26/15  2:27 AM  Result Value Ref Range   Lactic Acid, Venous 3.3 (HH) 0.5 - 1.9 mmol/L    Comment: CRITICAL RESULT CALLED TO, READ BACK BY AND VERIFIED WITHVira Blanco RN @ [phone] ON 11/26/15 BY C DAVIS   Lactic acid, plasma     Status: Abnormal   Collection Time: 11/26/15  5:26 AM  Result Value Ref Range   Lactic Acid, Venous 2.3 (HH) 0.5 - 1.9 mmol/L    Comment: CRITICAL RESULT CALLED TO, READ BACK BY AND VERIFIED  WITH: L HUDSON RN @ 0600 ON 11/26/15 BY C DAVIS   Comprehensive metabolic panel     Status: Abnormal   Collection Time: 11/26/15  5:26 AM  Result Value Ref Range   Sodium 138 135 - 145 mmol/L   Potassium 4.0 3.5 - 5.1 mmol/L   Chloride 107 101 - 111 mmol/L   CO2 23 22 - 32 mmol/L   Glucose, Bld 294 (H) 65 - 99 mg/dL   BUN 22 (H) 6 - 20 mg/dL   Creatinine, Ser 0.97 0.61 - 1.24 mg/dL   Calcium 7.9 (L) 8.9 - 10.3 mg/dL   Total Protein 6.0 (L) 6.5 - 8.1 g/dL   Albumin 3.0 (L) 3.5 - 5.0 g/dL   AST 86 (H) 15 - 41 U/L   ALT 32 17 - 63 U/L   Alkaline Phosphatase 59 38 - 126 U/L   Total Bilirubin 1.1 0.3 - 1.2 mg/dL   GFR calc non Af Amer >60 >60 mL/min   GFR calc Af Amer >60 >60 mL/min    Comment: (NOTE) The eGFR has been calculated using the CKD EPI equation. This calculation has not been validated in all clinical situations. eGFR's persistently <60 mL/min signify possible Chronic Kidney Disease.    Anion gap 8 5 - 15  CBC WITH DIFFERENTIAL     Status: Abnormal   Collection Time: 11/26/15  5:26 AM  Result Value Ref Range   WBC 31.0 (H) 4.0 - 10.5 K/uL    Comment: CONSISTENT WITH PREVIOUS RESULT   RBC 4.17 (L) 4.22 - 5.81 MIL/uL   Hemoglobin 11.6 (L) 13.0 - 17.0 g/dL   HCT 34.1 (L) 39.0 - 52.0 %   MCV 81.8 78.0 - 100.0 fL   MCH 27.8 26.0 - 34.0 pg   MCHC 34.0 30.0 - 36.0 g/dL   RDW 13.8 11.5 - 15.5 %   Platelets 232 150 - 400 K/uL   Neutrophils Relative % 91 %   Neutro Abs 28.3 (H) 1.7 - 7.7 K/uL   Lymphocytes Relative 4 %   Lymphs Abs 1.4 0.7 - 4.0 K/uL   Monocytes Relative 4 %   Monocytes Absolute 1.4 (H) 0.1 - 1.0 K/uL   Eosinophils Relative 0 %   Eosinophils Absolute 0.0 0.0 - 0.7 K/uL   Basophils Relative 0 %   Basophils Absolute 0.0 0.0 - 0.1 K/uL  Glucose, capillary     Status: Abnormal   Collection Time: 11/26/15  7:36 AM  Result Value Ref Range  Glucose-Capillary 254 (H) 65 - 99 mg/dL   Dg Tibia/fibula Right  Result Date: 11/25/2015 CLINICAL DATA:   60 year old male who fell from standing. Right lower extremity erythema and pain. Cellulitis suspected. Initial encounter. EXAM: RIGHT TIBIA AND FIBULA - 2 VIEW COMPARISON:  None. FINDINGS: Partially visible advanced degenerative changes at the right knee. Severe medial compartment joint space loss. Proximal tibia appears intact. Proximal fibula appears intact. Right tibia and fibula shafts appear intact. Distal right fibula appears intact, but there is a minimally displaced fracture of the anterior medial malleolus (arrows). Right mortise joint alignment appears preserved on these images. Grossly intact talus and calcaneus with degenerative spurring. Diffuse soft tissue swelling and stranding compatible with widespread soft tissue inflammation. No subcutaneous gas. IMPRESSION: 1. Minimally displaced fracture of the right medial malleolus. Consider follow-up dedicated right ankle series. 2. No other acute osseous abnormality identified in the right tib-fib. Degenerative changes at the knee and ankle. 3. Diffuse soft tissue swelling.  No subcutaneous gas. Electronically Signed   By: Genevie Ann M.D.   On: 11/25/2015 19:14   Dg Ankle Complete Right  Result Date: 11/25/2015 CLINICAL DATA:  60 year old male who fell from standing with evidence of medial malleolus fracture on tib-fib series today. Initial encounter. EXAM: RIGHT ANKLE - COMPLETE 3+ VIEW COMPARISON:  Right tib-fib series 18 41 hours today. FINDINGS: Diffuse soft tissue swelling about the visible right lower extremity. Mortise joint alignment preserved. Small curvilinear fracture fragment along the anterior medial malleolus re - demonstrated and appears stable from the earlier exam. Talar dome intact. Distal fibula intact. Calcaneus intact with bulky degenerative spurring and dystrophic calcification. Dorsal midfoot degenerative spurring. No other No acute osseous abnormality identified. IMPRESSION: 1. Small curvilinear, probably avulsion type, fracture  fragment along the anterior medial malleolus. 2. Right ankle otherwise intact. 3. Diffuse right lower extremities soft tissue swelling. Electronically Signed   By: Genevie Ann M.D.   On: 11/25/2015 19:56   Dg Chest Port 1 View  Result Date: 11/25/2015 CLINICAL DATA:  Fever and shortness of breath. Recent fall. Colon carcinoma. EXAM: PORTABLE CHEST 1 VIEW COMPARISON:  03/19/2012 FINDINGS: The heart size and mediastinal contours are within normal limits. Both lungs are clear. The visualized skeletal structures are unremarkable. IMPRESSION: No active disease. Electronically Signed   By: Earle Gell M.D.   On: 11/25/2015 19:10    ROS: ROS Pain with ambulation and movement of the right ankle Recent increased weakness and dyspnea  Physical Exam: Alert and appropriate 60 y/o male in no acute distress Bilateral upper and left lower extremity with full rom and no tenderness or deformity Right lower extremity: mild cellulitis to right lower leg with moderate edema Moderate tenderness to medial malleolus and ankle nv intact distally Able to go thru gentle rom with only mild discomfort No signs of draining wound or open fx Physical Exam   Assessment/Plan Assessment: right lower leg cellulitis with underlying acute medial malleolus fracture  Plan: Discussed case with Dr. Veverly Fells and recommend removal splint from the ortho tech to immobilize the ankle but be able to have access for skin checks for his cellulitis Continue IV antibiotics No need for surgical intervention for this fracture Once infection has cleared pt would be better suited with either a lower leg cast or cam boot to allow for complete healing which will take 8-10 weeks Will continue to monitor his progress Pain management as needed

## 2015-11-26 NOTE — Progress Notes (Signed)
*  Preliminary Results* Right lower extremity venous duplex completed. Study was very technically difficult and limited due to patient body habitus, depth of vessels, edema, and poor patient cooperation.  Patient was unable to tolerate compression maneuvers, therefore visualized veins of the right lower extremity were evaluated by color flow Doppler.  The visualized veins, the right femoral and popliteal veins, appear to be patent by color Doppler without any obvious evidence of deep vein thrombosis.   11/26/2015 9:12 AM  Maudry Mayhew, BS, RVT, RDCS, RDMS

## 2015-11-26 NOTE — Progress Notes (Signed)
*  PRELIMINARY RESULTS* Echocardiogram 2D Echocardiogram has been performed.  Leavy Cella 11/26/2015, 2:09 PM

## 2015-11-26 NOTE — H&P (Signed)
History and Physical    Brandon Schaefer [id number] DOB: Mar 31, 1956 DOA: 11/25/2015  PCP: Binnie Rail, MD   Patient coming from: Home.  Chief Complaint: Fall.  HPI: Brandon Schaefer is a 60 y.o. male with medical history significant of allergy, anemia, anxiety, colon cancer, type 2 diabetes, obesity, hyperlipidemia, diabetic peripheral neuropathy, hypertension who was brought to the emergency department via EMS after having a fall at home.  Per patient, he noticed that his right lower extremity was having tenderness, erythema, edema and calor since Friday. He denies any history of trauma prior to symptoms, which he states have gotten worse Saturday. He states that he felt nauseous since Saturday morning, did not have much oral intake and did not take his medications for the day. He mentions that he had 2 episodes of emesis earlier in the day, but denies abdominal pain, diarrhea, melena or hematochezia. He denies GU symptoms.   He says that in the afternoon he felt very fatigued and weak. Due to this weakness, he subsequently fell, but denies any injury. He denies LOC, chest pain, palpitations, dizziness, diaphoresis, worsening dyspnea prior or after having the fall.    ED Course: The patient received several rounds of morphine, Zofran 4 mg IVP, normal saline boluses, IV vancomycin and Zosyn.   His workup showed a WBC of 36K, Hemoglobin of 11.8 g/dL, Lactic acid of 4.80 mmol/L, decreased CO2 at 20 mmol/L, anion gap was 12, creatinine rose from 0.9-1.25 mg/dL chest radiograph did not show any acute pathology. Tibia/fibula and ankle radiographs show a minimally displaced fracture of the right medial malleolus (see arrows on x-rays), posterior arthritic changes of the knees and ankle and diffuse soft tissue swelling.  Review of Systems: As per HPI otherwise 10 point review of systems negative.    Past Medical History:  Diagnosis Date  . Allergy   . Anemia   . Anxiety   . colon ca dx'd  11/2011  . Diabetes mellitus   . Headache(784.0)   . History of blood transfusion   . Hyperlipidemia   . Hypertension   . Neuromuscular disorder ([place])    peripheral neuropathy  . Shortness of breath    with exertion    Past Surgical History:  Procedure Laterality Date  . COMPLEX WOUND CLOSURE N/A 01/06/2013   Procedure: EXCISION CHRONIC ABDOMINAL WOUND;  Surgeon: Harl Bowie, MD;  Location: [facility];  Service: General;  Laterality: N/A;  . CYSTOSCOPY W/ URETERAL STENT PLACEMENT Bilateral 01/04/2013   Procedure: CYSTOSCOPY WITH RETROGRADE PYELOGRAM/Right double J URETERAL STENT PLACEMENT;  Surgeon: Alexis Frock, MD;  Location: [facility];  Service: Urology;  Laterality: Bilateral;  . CYSTOSCOPY WITH RETROGRADE PYELOGRAM, URETEROSCOPY AND STENT PLACEMENT Right 01/06/2013   Procedure: CYSTOSCOPY WITH RIGHT RETROGRADE PYELOGRAM, URETEROSCOPY AND BILATERAL STENT EXCHANGE, BILATERAL STONE BASKET EXTRACTION ;  Surgeon: Alexis Frock, MD;  Location: [facility];  Service: Urology;  Laterality: Right;  . FRACTURE SURGERY      ORIF-left radius has pin  . HOLMIUM LASER APPLICATION N/A [id number]   Procedure: HOLMIUM LASER APPLICATION;  Surgeon: Alexis Frock, MD;  Location: [facility];  Service: Urology;  Laterality: N/A;  . NEPHROLITHOTOMY Left 01/04/2013   Procedure: NEPHROLITHOTOMY PERCUTANEOUS  LEFT 1ST STAGE PERCUTANEOUS NEPHROSTOLITHOTOMY;  Surgeon: Alexis Frock, MD;  Location: [facility];  Service: Urology;  Laterality: Left;  . NEPHROLITHOTOMY Left 01/06/2013   Procedure: NEPHROLITHOTOMY PERCUTANEOUS SECOND LOOK;  Surgeon: Alexis Frock, MD;  Location: [facility];  Service: Urology;  Laterality: Left;  .  PARTIAL COLECTOMY  01/17/2012   Procedure: PARTIAL COLECTOMY;  Surgeon: Harl Bowie, MD;  Location: [facility];  Service: General;;  . PILONIDAL CYST EXCISION    . PORT-A-CATH REMOVAL Left 01/06/2013   Procedure: REMOVAL PORT-A-CATH;  Surgeon: Harl Bowie, MD;  Location: [facility];  Service:  General;  Laterality: Left;  . PORTACATH PLACEMENT  03/19/2012   Procedure: INSERTION PORT-A-CATH;  Surgeon: Harl Bowie, MD;  Location: [place];  Service: General;  Laterality: N/A;  . PROCTOSCOPY  01/17/2012   Procedure: PROCTOSCOPY;  Surgeon: Harl Bowie, MD;  Location: [facility];  Service: General;;     reports that he has never smoked. He has never used smokeless tobacco. He reports that he does not drink alcohol or use drugs.  Allergies  Allergen Reactions  . Adhesive [Tape] Rash    Family History  Problem Relation Age of Onset  . Diabetes Father   . Cancer Maternal Aunt     colon  . Cancer Maternal Grandmother     colon  Family history reviewed and updated with the patient.  Prior to Admission medications   Medication Sig Start Date End Date Taking? Authorizing Provider  aspirin EC 81 MG tablet Take 81 mg by mouth daily.   Yes Historical Provider, MD  carvedilol (COREG) 6.25 MG tablet TAKE 1 TABLET BY MOUTH 2 TIMES DAILY, WITH A MEAL 08/09/15  Yes Binnie Rail, MD  furosemide (LASIX) 40 MG tablet TAKE 1 TABLET(40 MG) BY MOUTH DAILY 08/22/15  Yes Binnie Rail, MD  Insulin Lispro Prot & Lispro (HUMALOG MIX 75/25 [place]) Inject 50-100 Units into the skin 2 (two) times daily. 100 units in the am and 50 units at night   Yes Historical Provider, MD  linagliptin (TRADJENTA) 5 MG TABS tablet Take 1 tablet (5 mg total) by mouth daily. 08/22/15  Yes Binnie Rail, MD  meloxicam (MOBIC) 15 MG tablet Take 1 tablet (15 mg total) by mouth daily. 08/22/15  Yes Binnie Rail, MD  metFORMIN (GLUCOPHAGE) 1000 MG tablet Take 1,000 mg by mouth 2 (two) times daily with a meal.  07/17/12  Yes Historical Provider, MD  OPANA ER, CRUSH RESISTANT, 20 MG T12A Take 20 mg by mouth 2 (two) times daily. 11/13/15  Yes Historical Provider, MD  oxyCODONE (ROXICODONE) 15 MG immediate release tablet TK 1 T PO FOUR TO FIVE TIMES A DAY as needed for severe pain 12/11/14  Yes Historical Provider, MD    potassium chloride SA (K-DUR,KLOR-CON) 20 MEQ tablet TAKE 1 TABLET(20 MEQ) BY MOUTH DAILY 08/22/15  Yes Binnie Rail, MD  ramipril (ALTACE) 10 MG capsule TAKE ONE CAPSULE BY MOUTH DAILY BEFORE BREAKFAST Patient taking differently: Take 10 mg by mouth daily before breakfast.  08/22/15  Yes Binnie Rail, MD  rosuvastatin (CRESTOR) 20 MG tablet Take 1 tablet (20 mg total) by mouth daily before breakfast. 08/22/15  Yes Binnie Rail, MD  senna-docusate (SENOKOT-S) 8.6-50 MG per tablet Take 1 tablet by mouth 2 (two) times daily. While taking pain meds to prevent constipation Patient taking differently: Take 1 tablet by mouth 2 (two) times daily as needed for moderate constipation. While taking pain meds to prevent constipation 01/11/13  Yes Alexis Frock, MD  sertraline (ZOLOFT) 100 MG tablet Take 1.5 tablets (150 mg total) by mouth daily before breakfast. 10/24/15  Yes Binnie Rail, MD  triamcinolone (KENALOG) 0.025 % cream Apply 1 application topically 2 (two) times daily as needed  for rash. 09/06/15  Yes Historical Provider, MD  clobetasol (TEMOVATE) 0.05 % external solution Apply 1 application topically 2 (two) times daily. Do not use for more than 14 days in a row Patient not taking: Reported on 11/25/2015 03/20/15   Binnie Rail, MD  lidocaine (XYLOCAINE) 2 % solution Use as directed 10 mLs in the mouth or throat as needed for mouth pain. Patient not taking: Reported on 11/25/2015 08/03/15   [place], FNP    Physical Exam: Vitals:   11/25/15 2308 11/25/15 2330 11/26/15 0030 11/26/15 0100  BP: 108/57 123/80 122/68 111/60  Pulse: 97  99 93  Resp: 20 (!) 32 26 19  Temp:      TempSrc:      SpO2: 94%  94% 94%  Weight:      Height:          Constitutional: Looks acutely ill. Vitals:   11/25/15 2308 11/25/15 2330 11/26/15 0030 11/26/15 0100  BP: 108/57 123/80 122/68 111/60  Pulse: 97  99 93  Resp: 20 (!) 32 26 19  Temp:      TempSrc:      SpO2: 94%  94% 94%  Weight:      Height:        Eyes: PERRL, lids and conjunctivae normal ENMT: Mucous membranes and lips are very dry. Posterior pharynx clear of any exudate or lesions. Upper incisors are absent Neck: normal, supple, no masses, no thyromegaly Respiratory: Clear to auscultation bilaterally, no wheezing, no crackles. Normal respiratory effort. No accessory muscle use.  Cardiovascular: Regular rate and rhythm, no murmurs / rubs / gallops. No extremity edema. 2+ pedal pulses. No carotid bruits.  Abdomen: Obese, midline surgical scar, Bowel sounds positive. no tenderness, no masses palpated. No hepatosplenomegaly.  Musculoskeletal: no clubbing / cyanosis. Decreased ROM, tenderness, erythema and calor of RLE. Generalized weakness. Skin: Positive tenderness to palpation on RLE with surrounding erythema, edema and calor. Onychomycosis of toenails, plantar hyperkeratosis Neurologic: CN 2-12 grossly intact. Sensation intact, Generalized weakness  Psychiatric: Normal judgment and insight. Alert and oriented x 4. Normal mood.     Labs on Admission: I have personally reviewed following labs and imaging studies  CBC:  Recent Labs Lab 11/25/15 2239  WBC 36.0*  NEUTROABS 33.1*  HGB 11.8*  HCT 34.1*  MCV 81.0  PLT [id number]   Basic Metabolic Panel:  Recent Labs Lab 11/25/15 2052  NA 136  K 3.6  CL 104  CO2 20*  GLUCOSE 280*  BUN 20  CREATININE 1.25*  CALCIUM 8.5*   GFR: Estimated Creatinine Clearance: 103 mL/min (by C-G formula based on SCr of 1.25 mg/dL). Liver Function Tests:  Recent Labs Lab 11/25/15 2052  AST 54*  ALT 26  ALKPHOS 66  BILITOT 1.5*  PROT 5.8*  ALBUMIN 3.0*   Urine analysis:    Component Value Date/Time   COLORURINE AMBER (A) 11/25/2015 2143   APPEARANCEUR CLOUDY (A) 11/25/2015 2143   LABSPEC 1.024 11/25/2015 2143   PHURINE 5.5 11/25/2015 2143   GLUCOSEU 100 (A) 11/25/2015 2143   HGBUR LARGE (A) 11/25/2015 2143   BILIRUBINUR SMALL (A) 11/25/2015 2143   KETONESUR 15 (A) 11/25/2015  2143   PROTEINUR 30 (A) 11/25/2015 2143   NITRITE NEGATIVE 11/25/2015 2143   LEUKOCYTESUR NEGATIVE 11/25/2015 2143    Radiological Exams on Admission: Dg Tibia/fibula Right  Result Date: 11/25/2015 CLINICAL DATA:  60 year old male who fell from standing. Right lower extremity erythema and pain. Cellulitis suspected. Initial encounter. EXAM: RIGHT  TIBIA AND FIBULA - 2 VIEW COMPARISON:  None. FINDINGS: Partially visible advanced degenerative changes at the right knee. Severe medial compartment joint space loss. Proximal tibia appears intact. Proximal fibula appears intact. Right tibia and fibula shafts appear intact. Distal right fibula appears intact, but there is a minimally displaced fracture of the anterior medial malleolus (arrows). Right mortise joint alignment appears preserved on these images. Grossly intact talus and calcaneus with degenerative spurring. Diffuse soft tissue swelling and stranding compatible with widespread soft tissue inflammation. No subcutaneous gas. IMPRESSION: 1. Minimally displaced fracture of the right medial malleolus. Consider follow-up dedicated right ankle series. 2. No other acute osseous abnormality identified in the right tib-fib. Degenerative changes at the knee and ankle. 3. Diffuse soft tissue swelling.  No subcutaneous gas. Electronically Signed   By: Genevie Ann M.D.   On: 11/25/2015 19:14   Dg Ankle Complete Right  Result Date: 11/25/2015 CLINICAL DATA:  60 year old male who fell from standing with evidence of medial malleolus fracture on tib-fib series today. Initial encounter. EXAM: RIGHT ANKLE - COMPLETE 3+ VIEW COMPARISON:  Right tib-fib series 18 41 hours today. FINDINGS: Diffuse soft tissue swelling about the visible right lower extremity. Mortise joint alignment preserved. Small curvilinear fracture fragment along the anterior medial malleolus re - demonstrated and appears stable from the earlier exam. Talar dome intact. Distal fibula intact. Calcaneus  intact with bulky degenerative spurring and dystrophic calcification. Dorsal midfoot degenerative spurring. No other No acute osseous abnormality identified. IMPRESSION: 1. Small curvilinear, probably avulsion type, fracture fragment along the anterior medial malleolus. 2. Right ankle otherwise intact. 3. Diffuse right lower extremities soft tissue swelling. Electronically Signed   By: Genevie Ann M.D.   On: 11/25/2015 19:56   Dg Chest Port 1 View  Result Date: 11/25/2015 CLINICAL DATA:  Fever and shortness of breath. Recent fall. Colon carcinoma. EXAM: PORTABLE CHEST 1 VIEW COMPARISON:  03/19/2012 FINDINGS: The heart size and mediastinal contours are within normal limits. Both lungs are clear. The visualized skeletal structures are unremarkable. IMPRESSION: No active disease. Electronically Signed   By: Earle Gell M.D.   On: 11/25/2015 19:10    EKG: Independently reviewed.  Vent. rate 118 BPM PR interval * ms QRS duration 87 ms QT/QTc 343/481 ms P-R-T axes 63 66 30 Sinus tachycardia Low voltage, precordial leads Probable anteroseptal infarct, old  Assessment/Plan Principal Problem:   Sepsis due to cellulitis (HCC)   Cellulitis of right lower extremity Admit to stepdown/inpatient. Continue IV fluids. Supplemental oxygen. Continue scheduled and as needed analgesics. Continue antiemetics as needed. Local care of the area. Follow-up blood cultures and sensitivity.  Active Problems:   Lactic acidosis Continue IV fluids. Continue IV antibiotics. Hold metformin. Start supplemental oxygen. Follow-up lactic acid level.    Right medial malleolar fracture Consider follow-up dedicated right ankle series. Although minimally displaced, consider orthopedic surgery consult in a.m. May or may not need immobilization after right lower extremity tenderness, edema and erythema subside.    Abnormal EKG No complaints of chest pains. Continue daily aspirin. Check echocardiogram in the  morning.    DM (diabetes mellitus) ([place]) Carbohydrate modified diet. Hold metformin due to lactic acidosis. Continue Tradjenta 5 mg by mouth daily. Continue insulin lispro 75-25 100 units in a.m. and 50 units in p.m. CBG monitoring with regular insulin sliding scale.    HTN (hypertension) Continue carvedilol 6.25 mg by mouth twice a day. Continue ramipril 10 mg by mouth daily. Hold furosemide for now. Monitor blood pressure regularly.  Peripheral nerve disease ([place]) Continue analgesics as needed. Blood glucose control.    Arthritis of knee, degenerative   Back pain Continue meloxicam 15 mg by mouth daily. Continue Opana ER 20 mg by mouth twice a day. Continue oxycodone 15 mg by mouth 5 times a day as needed.    Hyperlipidemia Continue Crestor 20 mg by mouth daily. Monitor LFTs periodically.    Anxiety Continue Zoloft 150 mg by mouth daily.             DVT prophylaxis: Lovenox SQ. Code Status: Full code. Family Communication:  His brother and sister were present in the room. Disposition Plan: Admit for IV antibiotics for several days. Consults called:  Admission status: Inpatient/stepdown.   Reubin Milan MD [facility] Pager [phone].  If 7PM-7AM, please contact night-coverage [email or web address] Password [email or web address]  11/26/2015, 1:16 AM

## 2015-11-27 DIAGNOSIS — E0865 Diabetes mellitus due to underlying condition with hyperglycemia: Secondary | ICD-10-CM

## 2015-11-27 DIAGNOSIS — S82841D Displaced bimalleolar fracture of right lower leg, subsequent encounter for closed fracture with routine healing: Secondary | ICD-10-CM

## 2015-11-27 DIAGNOSIS — L8990 Pressure ulcer of unspecified site, unspecified stage: Secondary | ICD-10-CM | POA: Insufficient documentation

## 2015-11-27 LAB — CBC
HEMATOCRIT: 34.6 % — AB (ref 39.0–52.0)
HEMOGLOBIN: 11.3 g/dL — AB (ref 13.0–17.0)
MCH: 27.4 pg (ref 26.0–34.0)
MCHC: 32.7 g/dL (ref 30.0–36.0)
MCV: 84 fL (ref 78.0–100.0)
Platelets: 221 10*3/uL (ref 150–400)
RBC: 4.12 MIL/uL — ABNORMAL LOW (ref 4.22–5.81)
RDW: 14.2 % (ref 11.5–15.5)
WBC: 23.4 10*3/uL — ABNORMAL HIGH (ref 4.0–10.5)

## 2015-11-27 LAB — URINE CULTURE

## 2015-11-27 LAB — GLUCOSE, CAPILLARY
GLUCOSE-CAPILLARY: 223 mg/dL — AB (ref 65–99)
Glucose-Capillary: 193 mg/dL — ABNORMAL HIGH (ref 65–99)
Glucose-Capillary: 182 mg/dL — ABNORMAL HIGH (ref 65–99)
Glucose-Capillary: 177 mg/dL — ABNORMAL HIGH (ref 65–99)

## 2015-11-27 MED ORDER — INSULIN ASPART PROT & ASPART (70-30 MIX) 100 UNIT/ML ~~LOC~~ SUSP
60.0000 [IU] | Freq: Every day | SUBCUTANEOUS | Status: DC
Start: 2015-11-27 — End: 2015-12-02
  Administered 2015-11-27 – 2015-12-01 (×5): 60 [IU] via SUBCUTANEOUS

## 2015-11-27 MED ORDER — ALUM & MAG HYDROXIDE-SIMETH 200-200-20 MG/5ML PO SUSP
15.0000 mL | Freq: Four times a day (QID) | ORAL | Status: DC | PRN
  Administered 2015-11-27 – 2015-11-30 (×3): 15 mL via ORAL
  Filled 2015-11-27 (×3): qty 30

## 2015-11-27 MED ORDER — POLYETHYLENE GLYCOL 3350 17 G PO PACK
17.0000 g | PACK | Freq: Every day | ORAL | Status: DC
  Administered 2015-11-27 – 2015-11-28 (×2): 17 g via ORAL
  Filled 2015-11-27 (×3): qty 1

## 2015-11-27 MED ORDER — INSULIN ASPART PROT & ASPART (70-30 MIX) 100 UNIT/ML ~~LOC~~ SUSP
110.0000 [IU] | Freq: Every day | SUBCUTANEOUS | Status: DC
Start: 2015-11-28 — End: 2015-12-02
  Administered 2015-11-28 – 2015-12-02 (×5): 110 [IU] via SUBCUTANEOUS
  Filled 2015-11-27: qty 10

## 2015-11-27 MED ORDER — SENNOSIDES-DOCUSATE SODIUM 8.6-50 MG PO TABS
2.0000 | ORAL_TABLET | Freq: Two times a day (BID) | ORAL | Status: DC
  Administered 2015-11-27 – 2015-11-30 (×4): 2 via ORAL
  Filled 2015-11-27 (×5): qty 2

## 2015-11-27 NOTE — Progress Notes (Addendum)
PROGRESS NOTE                                                                                                                                                                                                             Patient Demographics:    Brandon Schaefer, is a 60 y.o. male, DOB - 04-25-1955, [id number]  Admit date - 11/25/2015   Admitting Physician Reubin Milan, MD  Outpatient Primary MD for the patient is Binnie Rail, MD  LOS - 1  Outpatient Specialists: None  Chief Complaint  Patient presents with  . Fall  . Leg Pain       Brief Narrative   60 year old morbidly obese male with history of type 2 diabetes mellitus, peripheral neuropathy, hypertension anxiety, history of colon cancer brought to the ED by EMS after falling at home. For past 1 day he had noticed right lower leg swelling with tenderness and erythema. Denied any trauma, fevers, chills, headache, shortness of breath, chest pain, abdominal pain bowel or urinary symptoms. He informs having nausea on the morning of admission and unable to take much by mouth. He had 2 episodes of vomiting earlier during the day as well. In the ED he was septic with fever of 101F, tachypneic, soft blood pressure, leukocytosis of 36K and elevated lactic acid >4. Patient was found to have cellulitis of his right leg with x-ray showing avulsion fracture of the medial malleolus. Admitted to stepdown unit. Sepsis pathway initiated on admission.    Subjective:   Patient reports some pain in his right leg. Afebrile this morning   Assessment  & Plan :    Principal Problem:   Sepsis ([place]) Suspect due to right lower leg cellulitis.Resolving. Leukocytosis improving. Afebrile. Continue vancomycin and Zosyn. Follow cultures. 2D echo shows normal EF and normal wall motion amounts admitted  Active Problems: Right lower leg cellulitis with swelling Empiric antibiotics. No  definite DVT on Doppler lower extremity (poor study) Pain control with oxycodone.  Right medial malleolus fracture Secondary to fall.  Orthopedics following. Recommend no surgical intervention and given his leg swellings unable to apply leg splint. Recommend weight onto his leg swellings reduced.    uncontrolled DM (diabetes mellitus) (HCC) CBG 150-250s. I will increase his twice a day aspart dose. continue tradjenta.  Hypokalemia Replenished  Peripheral vascular disease Continue aspirin, statin.  Essential hypertension Stable. Resume home medications       Code Status : Full code   Family Communication  :None at bedside   Disposition Plan  : Pending PT valve. Patient lives alone may possibly benefit from skilled facility. Barriers For Discharge : Active symptoms.  Consults  :  [facility]   Procedures  : Doppler right lower leg  DVT Prophylaxis  : Lovenox  Lab Results  Component Value Date   PLT 221 11/27/2015    Antibiotics  :   Anti-infectives    Start     Dose/Rate Route Frequency Ordered Stop   11/26/15 0900  vancomycin (VANCOCIN) 1,250 mg in sodium chloride 0.9 % 250 mL IVPB     1,250 mg 166.7 mL/hr over 90 Minutes Intravenous Every 12 hours 11/25/15 2218     11/26/15 0200  piperacillin-tazobactam (ZOSYN) IVPB 3.375 g     3.375 g 12.5 mL/hr over 240 Minutes Intravenous Every 8 hours 11/25/15 2218     11/25/15 2200  vancomycin (VANCOCIN) 1,500 mg in sodium chloride 0.9 % 500 mL IVPB     1,500 mg 250 mL/hr over 120 Minutes Intravenous STAT 11/25/15 2135 11/25/15 2351   11/25/15 1900  piperacillin-tazobactam (ZOSYN) IVPB 3.375 g     3.375 g 100 mL/hr over 30 Minutes Intravenous  Once 11/25/15 1852 11/25/15 2042   11/25/15 1845  vancomycin (VANCOCIN) IVPB 1000 mg/200 mL premix     1,000 mg 200 mL/hr over 60 Minutes Intravenous  Once 11/25/15 1841 11/25/15 2150        Objective:   Vitals:   11/26/15 1237 11/26/15 2207 11/27/15 0529  11/27/15 0758  BP: 121/63 (!) 102/50 133/66 135/60  Pulse: 88 96 93   Resp: 18 (!) 22 20   Temp: 98 F (36.7 C) 99.9 F (37.7 C) 99.5 F (37.5 C)   TempSrc: Oral Oral Oral   SpO2: 98% 97% 95%   Weight:      Height:        Wt Readings from Last 3 Encounters:  11/26/15 (!) 187.3 kg (413 lb)  08/22/15 (!) 186.4 kg (411 lb)  08/03/15 (!) 179.2 kg (395 lb)     Intake/Output Summary (Last 24 hours) at 11/27/15 1234 Last data filed at 11/27/15 [id number]  Gross per 24 hour  Intake             2852 ml  Output             1690 ml  Net             1162 ml     Physical Exam  [id number] obese male not in distress  HEENT: no pallor, moist mucosa, supple neck Chest: clear b/l, no added sounds CVS: N S1&S2, no murmurs, rubs or gallop GI: soft, NT, ND, BS+ Musculoskeletal:Swelling With erythema over right leg (mainly involving the right calf), improved from yesterday. (Swelling with some erythema over the right ankle medially). Tender to pressure with limited ROM right ankle    Data Review:    CBC  Recent Labs Lab 11/25/15 2239 11/26/15 0526 11/27/15 0603  WBC 36.0* 31.0* 23.4*  HGB 11.8* 11.6* 11.3*  HCT 34.1* 34.1* 34.6*  PLT 234 232 221  MCV 81.0 81.8 84.0  MCH 28.0 27.8 27.4  MCHC 34.6 34.0 32.7  RDW 13.4 13.8 14.2  LYMPHSABS 1.1 1.4  --   MONOABS 1.8* 1.4*  --   EOSABS 0.0 0.0  --   BASOSABS 0.0 0.0  --  Chemistries   Recent Labs Lab 11/25/15 2052 11/26/15 0526  NA 136 138  K 3.6 4.0  CL 104 107  CO2 20* 23  GLUCOSE 280* 294*  BUN 20 22*  CREATININE 1.25* 0.97  CALCIUM 8.5* 7.9*  AST 54* 86*  ALT 26 32  ALKPHOS 66 59  BILITOT 1.5* 1.1   ------------------------------------------------------------------------------------------------------------------ No results for input(s): CHOL, HDL, LDLCALC, TRIG, CHOLHDL, LDLDIRECT in the last 72 hours.  No results found for:  HGBA1C ------------------------------------------------------------------------------------------------------------------ No results for input(s): TSH, T4TOTAL, T3FREE, THYROIDAB in the last 72 hours.  Invalid input(s): FREET3 ------------------------------------------------------------------------------------------------------------------ No results for input(s): VITAMINB12, FOLATE, FERRITIN, TIBC, IRON, RETICCTPCT in the last 72 hours.  Coagulation profile No results for input(s): INR, PROTIME in the last 168 hours.  No results for input(s): DDIMER in the last 72 hours.  Cardiac Enzymes No results for input(s): CKMB, TROPONINI, MYOGLOBIN in the last 168 hours.  Invalid input(s): CK ------------------------------------------------------------------------------------------------------------------ No results found for: BNP  Inpatient Medications  Scheduled Meds: . aspirin EC  81 mg Oral Daily  . carvedilol  6.25 mg Oral BID WC  . enoxaparin (LOVENOX) injection  90 mg Subcutaneous Q24H  . famotidine  40 mg Oral BID  . insulin aspart  0-15 Units Subcutaneous TID WC  . insulin aspart protamine- aspart  100 Units Subcutaneous Q breakfast  . insulin aspart protamine- aspart  50 Units Subcutaneous Q supper  . linagliptin  5 mg Oral Daily  . meloxicam  15 mg Oral Daily  . morphine  60 mg Oral Q12H  . piperacillin-tazobactam (ZOSYN)  IV  3.375 g Intravenous Q8H  . polyethylene glycol  17 g Oral Daily  . potassium chloride SA  20 mEq Oral Daily  . ramipril  10 mg Oral QAC breakfast  . rosuvastatin  20 mg Oral QAC breakfast  . senna-docusate  2 tablet Oral BID  . sertraline  150 mg Oral QAC breakfast  . sodium chloride flush  3 mL Intravenous Q12H  . vancomycin  1,250 mg Intravenous Q12H   Continuous Infusions:   PRN Meds:.alum & mag hydroxide-simeth, ondansetron **OR** ondansetron (ZOFRAN) IV, oxyCODONE, senna-docusate  Micro Results Recent Results (from the past 240 hour(s))   Blood Culture (routine x 2)     Status: None (Preliminary result)   Collection Time: 11/25/15  8:39 PM  Result Value Ref Range Status   Specimen Description BLOOD RIGHT HAND  Final   Special Requests BOTTLES DRAWN AEROBIC AND ANAEROBIC 5CC  Final   Culture   Final    NO GROWTH < 24 HOURS Performed at [facility]    Report Status PENDING  Incomplete  Blood Culture (routine x 2)     Status: None (Preliminary result)   Collection Time: 11/25/15  8:53 PM  Result Value Ref Range Status   Specimen Description BLOOD LEFT HAND  Final   Special Requests BOTTLES DRAWN AEROBIC ONLY 5CC  Final   Culture   Final    NO GROWTH < 24 HOURS Performed at [facility]    Report Status PENDING  Incomplete  Urine culture     Status: Abnormal   Collection Time: 11/25/15  9:43 PM  Result Value Ref Range Status   Specimen Description URINE, CLEAN CATCH  Final   Special Requests NONE  Final   Culture MULTIPLE SPECIES PRESENT, SUGGEST RECOLLECTION (A)  Final   Report Status 11/27/2015 FINAL  Final  MRSA PCR Screening     Status: None   Collection  Time: 11/26/15  2:14 AM  Result Value Ref Range Status   MRSA by PCR NEGATIVE NEGATIVE Final    Comment:        The GeneXpert MRSA Assay (FDA approved for NASAL specimens only), is one component of a comprehensive MRSA colonization surveillance program. It is not intended to diagnose MRSA infection nor to guide or monitor treatment for MRSA infections.     Radiology Reports Dg Tibia/fibula Right  Result Date: 11/25/2015 CLINICAL DATA:  60 year old male who fell from standing. Right lower extremity erythema and pain. Cellulitis suspected. Initial encounter. EXAM: RIGHT TIBIA AND FIBULA - 2 VIEW COMPARISON:  None. FINDINGS: Partially visible advanced degenerative changes at the right knee. Severe medial compartment joint space loss. Proximal tibia appears intact. Proximal fibula appears intact. Right tibia and fibula shafts appear intact.  Distal right fibula appears intact, but there is a minimally displaced fracture of the anterior medial malleolus (arrows). Right mortise joint alignment appears preserved on these images. Grossly intact talus and calcaneus with degenerative spurring. Diffuse soft tissue swelling and stranding compatible with widespread soft tissue inflammation. No subcutaneous gas. IMPRESSION: 1. Minimally displaced fracture of the right medial malleolus. Consider follow-up dedicated right ankle series. 2. No other acute osseous abnormality identified in the right tib-fib. Degenerative changes at the knee and ankle. 3. Diffuse soft tissue swelling.  No subcutaneous gas. Electronically Signed   By: Genevie Ann M.D.   On: 11/25/2015 19:14   Dg Ankle Complete Right  Result Date: 11/25/2015 CLINICAL DATA:  60 year old male who fell from standing with evidence of medial malleolus fracture on tib-fib series today. Initial encounter. EXAM: RIGHT ANKLE - COMPLETE 3+ VIEW COMPARISON:  Right tib-fib series 18 41 hours today. FINDINGS: Diffuse soft tissue swelling about the visible right lower extremity. Mortise joint alignment preserved. Small curvilinear fracture fragment along the anterior medial malleolus re - demonstrated and appears stable from the earlier exam. Talar dome intact. Distal fibula intact. Calcaneus intact with bulky degenerative spurring and dystrophic calcification. Dorsal midfoot degenerative spurring. No other No acute osseous abnormality identified. IMPRESSION: 1. Small curvilinear, probably avulsion type, fracture fragment along the anterior medial malleolus. 2. Right ankle otherwise intact. 3. Diffuse right lower extremities soft tissue swelling. Electronically Signed   By: Genevie Ann M.D.   On: 11/25/2015 19:56   Dg Chest Port 1 View  Result Date: 11/25/2015 CLINICAL DATA:  Fever and shortness of breath. Recent fall. Colon carcinoma. EXAM: PORTABLE CHEST 1 VIEW COMPARISON:  03/19/2012 FINDINGS: The heart size and  mediastinal contours are within normal limits. Both lungs are clear. The visualized skeletal structures are unremarkable. IMPRESSION: No active disease. Electronically Signed   By: Earle Gell M.D.   On: 11/25/2015 19:10    Time Spent in minutes  25   Louellen Molder M.D on 11/27/2015 at 12:34 PM  Between 7am to 7pm - Pager - [phone]  After 7pm go to [email or web address] - password [facility]  [facility] -  Office  [phone]

## 2015-11-27 NOTE — Progress Notes (Signed)
   Subjective:    Still c/o mild to moderate pain to right lower leg Unable to apply sufficient splint due to swelling His injury is essentially a sprain  Recommend supportive therapy with pain management and may splint vs brace once edema improves  Patient reports pain as moderate.  Objective:   VITALS:   Vitals:   11/27/15 0529 11/27/15 0758  BP: 133/66 135/60  Pulse: 93   Resp: 20   Temp: 99.5 F (37.5 C)     Right lower extremity with mild tenderness to medial malleolus nv intact distally Moderate edema and erythema slightly improved  LABS  Recent Labs  11/25/15 2239 11/26/15 0526 11/27/15 0603  HGB 11.8* 11.6* 11.3*  HCT 34.1* 34.1* 34.6*  WBC 36.0* 31.0* 23.4*  PLT 234 232 221     Recent Labs  11/25/15 2052 11/26/15 0526  NA 136 138  K 3.6 4.0  BUN 20 22*  CREATININE 1.25* 0.97  GLUCOSE 280* 294*     Assessment/Plan:   right medial malleolus fracture Touch down weight bearing is okay as tolerable May splint vs brace once edema improves Will continue to monitor his progress     Merla Riches, MPAS, PA-C  11/27/2015, 3:13 PM

## 2015-11-28 LAB — BASIC METABOLIC PANEL
ANION GAP: 6 (ref 5–15)
BUN: 16 mg/dL (ref 6–20)
CALCIUM: 8.2 mg/dL — AB (ref 8.9–10.3)
CO2: 25 mmol/L (ref 22–32)
CREATININE: 0.84 mg/dL (ref 0.61–1.24)
Chloride: 108 mmol/L (ref 101–111)
Glucose, Bld: 131 mg/dL — ABNORMAL HIGH (ref 65–99)
Potassium: 4.2 mmol/L (ref 3.5–5.1)
Sodium: 139 mmol/L (ref 135–145)

## 2015-11-28 LAB — CBC
HEMATOCRIT: 32.7 % — AB (ref 39.0–52.0)
Hemoglobin: 10.9 g/dL — ABNORMAL LOW (ref 13.0–17.0)
MCH: 27.8 pg (ref 26.0–34.0)
MCHC: 33.3 g/dL (ref 30.0–36.0)
MCV: 83.4 fL (ref 78.0–100.0)
PLATELETS: 253 10*3/uL (ref 150–400)
RBC: 3.92 MIL/uL — ABNORMAL LOW (ref 4.22–5.81)
RDW: 14.2 % (ref 11.5–15.5)
WBC: 15.6 10*3/uL — AB (ref 4.0–10.5)

## 2015-11-28 LAB — GLUCOSE, CAPILLARY
GLUCOSE-CAPILLARY: 133 mg/dL — AB (ref 65–99)
GLUCOSE-CAPILLARY: 87 mg/dL (ref 65–99)
Glucose-Capillary: 128 mg/dL — ABNORMAL HIGH (ref 65–99)
Glucose-Capillary: 218 mg/dL — ABNORMAL HIGH (ref 65–99)

## 2015-11-28 MED ORDER — CEFAZOLIN SODIUM-DEXTROSE 2-4 GM/100ML-% IV SOLN
2.0000 g | Freq: Three times a day (TID) | INTRAVENOUS | Status: DC
  Administered 2015-11-28 – 2015-12-01 (×9): 2 g via INTRAVENOUS
  Filled 2015-11-28 (×10): qty 100

## 2015-11-28 MED ORDER — FUROSEMIDE 10 MG/ML IJ SOLN
40.0000 mg | Freq: Once | INTRAMUSCULAR | Status: AC
  Administered 2015-11-28: 40 mg via INTRAVENOUS
  Filled 2015-11-28: qty 4

## 2015-11-28 MED ORDER — FUROSEMIDE 10 MG/ML IJ SOLN
40.0000 mg | Freq: Two times a day (BID) | INTRAMUSCULAR | Status: DC
  Administered 2015-11-28 – 2015-12-01 (×7): 40 mg via INTRAVENOUS
  Filled 2015-11-28 (×8): qty 4

## 2015-11-28 NOTE — Progress Notes (Signed)
PT Cancellation Note  Patient Details Name: Brandon Schaefer MRN: [id number] DOB: December 26, 1955   Cancelled Treatment:    Reason Eval/Treat Not Completed: Fatigue/lethargy limiting ability to participate Pt states he has been up to [facility] a few times today and is too fatigued to perform mobility at this time.  Pt politely requests PT evaluation tomorrow.  Since pt reports he has been OOB, pt educated on TDWB precaution and provided with appropriate RW in room for nursing staff.   Lareina Espino,KATHrine E 11/28/2015, 2:24 PM Carmelia Bake, PT, DPT 11/28/2015 Pager: [phone]

## 2015-11-28 NOTE — Plan of Care (Signed)
Problem: Skin Integrity: Goal: Risk for impaired skin integrity will decrease Continue.  Pt refusing turning Q 2 hours.  Education done.  Pt verbalizes understanding.   MASD to groin, pannus, and under breasts.   Nystatin applied to breasts.  Interdry to groin and pannus.  Good pericare.   Using male urinal with success and continent of bowels.    Problem: Activity: Goal: Risk for activity intolerance will decrease Continue.  Pt refused PT today.    Problem: Fluid Volume: Goal: Ability to maintain a balanced intake and output will improve Diuresing.    Problem: Nutrition: Goal: Adequate nutrition will be maintained Outcome: Progressing Slowly improving appetite.

## 2015-11-28 NOTE — Progress Notes (Signed)
PROGRESS NOTE                                                                                                                                                                                                             Patient Demographics:    Brandon Schaefer, is a 60 y.o. male, DOB - 1956-01-21, [id number]  Admit date - 11/25/2015   Admitting Physician Reubin Milan, MD  Outpatient Primary MD for the patient is Binnie Rail, MD  LOS - 2  Outpatient Specialists: None  Chief Complaint  Patient presents with  . Fall  . Leg Pain       Brief Narrative   60 year old morbidly obese male with history of type 2 diabetes mellitus, peripheral neuropathy, hypertension anxiety, history of colon cancer brought to the ED by EMS after falling at home. For past 1 day he had noticed right lower leg swelling with tenderness and erythema. Denied any trauma, fevers, chills, headache, shortness of breath, chest pain, abdominal pain bowel or urinary symptoms. He informs having nausea on the morning of admission and unable to take much by mouth. He had 2 episodes of vomiting earlier during the day as well. In the ED he was septic with fever of 101F, tachypneic, soft blood pressure, leukocytosis of 36K and elevated lactic acid >4. Patient was found to have cellulitis of his right leg with x-ray showing avulsion fracture of the medial malleolus. Admitted to stepdown unit. Sepsis pathway initiated on admission.    Subjective:   Some pain in his right ankle.   Assessment  & Plan :    Principal Problem:   Sepsis (Onset) Secondary to right lower leg cellulitis. Resolved.  Leukocytosis improving. Afebrile. Cultures negative. narrow antibiotics to Ancef. 2D echo shows normal EF and normal wall motion. Doppler right leg negative for DVT. Pain control with oxycodone.  Right medial malleolus fracture Secondary to fall.  Orthopedics  following. Touchdown weight bearing if tolerated. Splint versus base once leg edema improves.  Bilateral Leg edema Add daily Lasix.  uncontrolled DM (diabetes mellitus) (HCC) CBG improved after aspart dose increased.  Hypokalemia Replenished  Peripheral vascular disease Continue aspirin, statin.   Essential hypertension Stable. Continue home medications       Code Status : Full code   Family Communication  :spoke with brother [name] on the phone  Disposition Plan  : PT evaluation pending. Lives alone and likely benefits from going to SNF. Barriers For Discharge : Active symptoms.  Consults  :  [facility]   Procedures  : Doppler right lower leg 2D Echo  DVT Prophylaxis  : Lovenox  Lab Results  Component Value Date   PLT 253 11/28/2015    Antibiotics  :   Anti-infectives    Start     Dose/Rate Route Frequency Ordered Stop   11/28/15 1400  ceFAZolin (ANCEF) IVPB 2g/100 mL premix     2 g 200 mL/hr over 30 Minutes Intravenous Every 8 hours 11/28/15 1044     11/26/15 0900  vancomycin (VANCOCIN) 1,250 mg in sodium chloride 0.9 % 250 mL IVPB  Status:  Discontinued     1,250 mg 166.7 mL/hr over 90 Minutes Intravenous Every 12 hours 11/25/15 2218 11/28/15 0919   11/26/15 0200  piperacillin-tazobactam (ZOSYN) IVPB 3.375 g  Status:  Discontinued     3.375 g 12.5 mL/hr over 240 Minutes Intravenous Every 8 hours 11/25/15 2218 11/28/15 0919   11/25/15 2200  vancomycin (VANCOCIN) 1,500 mg in sodium chloride 0.9 % 500 mL IVPB     1,500 mg 250 mL/hr over 120 Minutes Intravenous STAT 11/25/15 2135 11/25/15 2351   11/25/15 1900  piperacillin-tazobactam (ZOSYN) IVPB 3.375 g     3.375 g 100 mL/hr over 30 Minutes Intravenous  Once 11/25/15 1852 11/25/15 2042   11/25/15 1845  vancomycin (VANCOCIN) IVPB 1000 mg/200 mL premix     1,000 mg 200 mL/hr over 60 Minutes Intravenous  Once 11/25/15 1841 11/25/15 2150        Objective:   Vitals:   11/27/15 0758 11/27/15  1706 11/27/15 2227 11/28/15 0602  BP: 135/60 (!) 132/57 (!) 112/45 120/60  Pulse:  79 75 86  Resp:  20 20 20   Temp:  99.5 F (37.5 C) 98.2 F (36.8 C) 98 F (36.7 C)  TempSrc:  Oral Oral Oral  SpO2:  97% 97% 98%  Weight:      Height:        Wt Readings from Last 3 Encounters:  11/26/15 (!) 187.3 kg (413 lb)  08/22/15 (!) 186.4 kg (411 lb)  08/03/15 (!) 179.2 kg (395 lb)     Intake/Output Summary (Last 24 hours) at 11/28/15 1319 Last data filed at 11/28/15 1149  Gross per 24 hour  Intake              830 ml  Output             1725 ml  Net             -895 ml     Physical Exam  [id number] obese male not in distress  HEENT:  moist mucosa, supple neck Chest: clear b/l, no added sounds CVS: N S1&S2, no murmurs,  GI: soft, NT, ND, BS+ Musculoskeletal:Swelling With erythema over right leg (mainly involving the right calf),(Swelling with some erythema over the right ankle medially). Tender to pressure with limited ROM right ankle Much improved since admission.    Data Review:    CBC  Recent Labs Lab 11/25/15 2239 11/26/15 0526 11/27/15 0603 11/28/15 0457  WBC 36.0* 31.0* 23.4* 15.6*  HGB 11.8* 11.6* 11.3* 10.9*  HCT 34.1* 34.1* 34.6* 32.7*  PLT 234 232 221 253  MCV 81.0 81.8 84.0 83.4  MCH 28.0 27.8 27.4 27.8  MCHC 34.6 34.0 32.7 33.3  RDW 13.4 13.8 14.2 14.2  LYMPHSABS 1.1 1.4  --   --  MONOABS 1.8* 1.4*  --   --   EOSABS 0.0 0.0  --   --   BASOSABS 0.0 0.0  --   --     Chemistries   Recent Labs Lab 11/25/15 2052 11/26/15 0526 11/28/15 0457  NA 136 138 139  K 3.6 4.0 4.2  CL 104 107 108  CO2 20* 23 25  GLUCOSE 280* 294* 131*  BUN 20 22* 16  CREATININE 1.25* 0.97 0.84  CALCIUM 8.5* 7.9* 8.2*  AST 54* 86*  --   ALT 26 32  --   ALKPHOS 66 59  --   BILITOT 1.5* 1.1  --    ------------------------------------------------------------------------------------------------------------------ No results for input(s): CHOL, HDL, LDLCALC, TRIG,  CHOLHDL, LDLDIRECT in the last 72 hours.  No results found for: HGBA1C ------------------------------------------------------------------------------------------------------------------ No results for input(s): TSH, T4TOTAL, T3FREE, THYROIDAB in the last 72 hours.  Invalid input(s): FREET3 ------------------------------------------------------------------------------------------------------------------ No results for input(s): VITAMINB12, FOLATE, FERRITIN, TIBC, IRON, RETICCTPCT in the last 72 hours.  Coagulation profile No results for input(s): INR, PROTIME in the last 168 hours.  No results for input(s): DDIMER in the last 72 hours.  Cardiac Enzymes No results for input(s): CKMB, TROPONINI, MYOGLOBIN in the last 168 hours.  Invalid input(s): CK ------------------------------------------------------------------------------------------------------------------ No results found for: BNP  Inpatient Medications  Scheduled Meds: . aspirin EC  81 mg Oral Daily  . carvedilol  6.25 mg Oral BID WC  .  ceFAZolin (ANCEF) IV  2 g Intravenous Q8H  . enoxaparin (LOVENOX) injection  90 mg Subcutaneous Q24H  . famotidine  40 mg Oral BID  . furosemide  40 mg Intravenous BID  . insulin aspart  0-15 Units Subcutaneous TID WC  . insulin aspart protamine- aspart  110 Units Subcutaneous Q breakfast  . insulin aspart protamine- aspart  60 Units Subcutaneous Q supper  . linagliptin  5 mg Oral Daily  . meloxicam  15 mg Oral Daily  . morphine  60 mg Oral Q12H  . polyethylene glycol  17 g Oral Daily  . potassium chloride SA  20 mEq Oral Daily  . ramipril  10 mg Oral QAC breakfast  . rosuvastatin  20 mg Oral QAC breakfast  . senna-docusate  2 tablet Oral BID  . sertraline  150 mg Oral QAC breakfast  . sodium chloride flush  3 mL Intravenous Q12H   Continuous Infusions:   PRN Meds:.alum & mag hydroxide-simeth, ondansetron **OR** ondansetron (ZOFRAN) IV, oxyCODONE, senna-docusate  Micro  Results Recent Results (from the past 240 hour(s))  Blood Culture (routine x 2)     Status: None (Preliminary result)   Collection Time: 11/25/15  8:39 PM  Result Value Ref Range Status   Specimen Description BLOOD RIGHT HAND  Final   Special Requests BOTTLES DRAWN AEROBIC AND ANAEROBIC 5CC  Final   Culture   Final    NO GROWTH 2 DAYS Performed at [facility]    Report Status PENDING  Incomplete  Blood Culture (routine x 2)     Status: None (Preliminary result)   Collection Time: 11/25/15  8:53 PM  Result Value Ref Range Status   Specimen Description BLOOD LEFT HAND  Final   Special Requests BOTTLES DRAWN AEROBIC ONLY 5CC  Final   Culture   Final    NO GROWTH 2 DAYS Performed at [facility]    Report Status PENDING  Incomplete  Urine culture     Status: Abnormal   Collection Time: 11/25/15  9:43 PM  Result  Value Ref Range Status   Specimen Description URINE, CLEAN CATCH  Final   Special Requests NONE  Final   Culture MULTIPLE SPECIES PRESENT, SUGGEST RECOLLECTION (A)  Final   Report Status 11/27/2015 FINAL  Final  MRSA PCR Screening     Status: None   Collection Time: 11/26/15  2:14 AM  Result Value Ref Range Status   MRSA by PCR NEGATIVE NEGATIVE Final    Comment:        The GeneXpert MRSA Assay (FDA approved for NASAL specimens only), is one component of a comprehensive MRSA colonization surveillance program. It is not intended to diagnose MRSA infection nor to guide or monitor treatment for MRSA infections.     Radiology Reports Dg Tibia/fibula Right  Result Date: 11/25/2015 CLINICAL DATA:  60 year old male who fell from standing. Right lower extremity erythema and pain. Cellulitis suspected. Initial encounter. EXAM: RIGHT TIBIA AND FIBULA - 2 VIEW COMPARISON:  None. FINDINGS: Partially visible advanced degenerative changes at the right knee. Severe medial compartment joint space loss. Proximal tibia appears intact. Proximal fibula appears intact.  Right tibia and fibula shafts appear intact. Distal right fibula appears intact, but there is a minimally displaced fracture of the anterior medial malleolus (arrows). Right mortise joint alignment appears preserved on these images. Grossly intact talus and calcaneus with degenerative spurring. Diffuse soft tissue swelling and stranding compatible with widespread soft tissue inflammation. No subcutaneous gas. IMPRESSION: 1. Minimally displaced fracture of the right medial malleolus. Consider follow-up dedicated right ankle series. 2. No other acute osseous abnormality identified in the right tib-fib. Degenerative changes at the knee and ankle. 3. Diffuse soft tissue swelling.  No subcutaneous gas. Electronically Signed   By: Genevie Ann M.D.   On: 11/25/2015 19:14   Dg Ankle Complete Right  Result Date: 11/25/2015 CLINICAL DATA:  60 year old male who fell from standing with evidence of medial malleolus fracture on tib-fib series today. Initial encounter. EXAM: RIGHT ANKLE - COMPLETE 3+ VIEW COMPARISON:  Right tib-fib series 18 41 hours today. FINDINGS: Diffuse soft tissue swelling about the visible right lower extremity. Mortise joint alignment preserved. Small curvilinear fracture fragment along the anterior medial malleolus re - demonstrated and appears stable from the earlier exam. Talar dome intact. Distal fibula intact. Calcaneus intact with bulky degenerative spurring and dystrophic calcification. Dorsal midfoot degenerative spurring. No other No acute osseous abnormality identified. IMPRESSION: 1. Small curvilinear, probably avulsion type, fracture fragment along the anterior medial malleolus. 2. Right ankle otherwise intact. 3. Diffuse right lower extremities soft tissue swelling. Electronically Signed   By: Genevie Ann M.D.   On: 11/25/2015 19:56   Dg Chest Port 1 View  Result Date: 11/25/2015 CLINICAL DATA:  Fever and shortness of breath. Recent fall. Colon carcinoma. EXAM: PORTABLE CHEST 1 VIEW  COMPARISON:  03/19/2012 FINDINGS: The heart size and mediastinal contours are within normal limits. Both lungs are clear. The visualized skeletal structures are unremarkable. IMPRESSION: No active disease. Electronically Signed   By: Earle Gell M.D.   On: 11/25/2015 19:10    Time Spent in minutes  25   Louellen Molder M.D on 11/28/2015 at 1:19 PM  Between 7am to 7pm - Pager - [phone]  After 7pm go to [email or web address] - password [facility]  [facility] -  Office  [phone]

## 2015-11-29 LAB — GLUCOSE, CAPILLARY
GLUCOSE-CAPILLARY: 144 mg/dL — AB (ref 65–99)
GLUCOSE-CAPILLARY: 120 mg/dL — AB (ref 65–99)
GLUCOSE-CAPILLARY: 137 mg/dL — AB (ref 65–99)
GLUCOSE-CAPILLARY: 103 mg/dL — AB (ref 65–99)

## 2015-11-29 LAB — CBC
HCT: 33.3 % — ABNORMAL LOW (ref 39.0–52.0)
Hemoglobin: 11 g/dL — ABNORMAL LOW (ref 13.0–17.0)
MCH: 27.1 pg (ref 26.0–34.0)
MCHC: 33 g/dL (ref 30.0–36.0)
MCV: 82 fL (ref 78.0–100.0)
PLATELETS: 270 10*3/uL (ref 150–400)
RBC: 4.06 MIL/uL — AB (ref 4.22–5.81)
RDW: 13.7 % (ref 11.5–15.5)
WBC: 10.4 10*3/uL (ref 4.0–10.5)

## 2015-11-29 MED ORDER — MORPHINE SULFATE (PF) 2 MG/ML IV SOLN
2.0000 mg | Freq: Once | INTRAVENOUS | Status: AC
  Administered 2015-11-29: 2 mg via INTRAVENOUS
  Filled 2015-11-29: qty 1

## 2015-11-29 NOTE — Progress Notes (Signed)
PROGRESS NOTE                                                                                                                                                                                                             Patient Demographics:    Brandon Schaefer, is a 60 y.o. male, DOB - 1956/01/04, [id number]  Admit date - 11/25/2015   Admitting Physician Reubin Milan, MD  Outpatient Primary MD for the patient is Binnie Rail, MD  LOS - 3   Brief Narrative   60 year old morbidly obese male with history of type 2 diabetes mellitus, peripheral neuropathy, hypertension anxiety, history of colon cancer brought to the ED by EMS after falling at home,  right lower leg swelling with tenderness and erythema.   In the ED he was septic with fever of 101F, tachypneic, soft blood pressure, leukocytosis of 36K and elevated lactic acid >4. Patient was found to have cellulitis of his right leg with x-ray showing avulsion fracture of the medial malleolus. Admitted to stepdown unit. Sepsis pathway initiated on admission.   Subjective:   Still with RLL pain, weakness.    Assessment  & Plan :   Principal Problem: Sepsis (Max) - Secondary to right lower leg cellulitis. Resolved.  Leukocytosis resolved, pt afebrile  - Cultures negative. Pt transitioned to Ancef.  - 2D echo shows normal EF and normal wall motion. Doppler right leg negative for DVT. - Pain control with oxycodone.  Right medial malleolus fracture - Secondary to fall.  - Orthopedics following. Touchdown weight bearing if tolerated. Splint versus base once leg edema improves. - PT eval pending as pt has declined therapy   Bilateral Leg edema secondary to chronic venous stasis changes, obesity peripheral vascular disease  - stable ECHO  - continue with lasix, aspirin   Uncontrolled DM (diabetes mellitus) ([place]) with neuropathies   - continue insulin protamine,  linagliptin   Hypokalemia - supplemented and WNL   Peripheral vascular disease - Continue aspirin, statin.  Essential hypertension - reasonable inpatient control   Morbid obesity - Body mass index is 56.01 kg/m.   Code Status : Full code   Family Communication  :spoke with brother [name] on the phone  Disposition Plan  : PT evaluation pending. Lives alone and likely benefits from going to SNF. PT has declined therapy yesterday, encouraged  participation  Barriers For Discharge : Active symptoms.  Consults  :  [facility]   Procedures  : Doppler right lower leg 2D Echo  DVT Prophylaxis  : Lovenox  Antibiotics  :   Anti-infectives    Start     Dose/Rate Route Frequency Ordered Stop   11/28/15 1400  ceFAZolin (ANCEF) IVPB 2g/100 mL premix     2 g 200 mL/hr over 30 Minutes Intravenous Every 8 hours 11/28/15 1044     11/26/15 0900  vancomycin (VANCOCIN) 1,250 mg in sodium chloride 0.9 % 250 mL IVPB  Status:  Discontinued     1,250 mg 166.7 mL/hr over 90 Minutes Intravenous Every 12 hours 11/25/15 2218 11/28/15 0919   11/26/15 0200  piperacillin-tazobactam (ZOSYN) IVPB 3.375 g  Status:  Discontinued     3.375 g 12.5 mL/hr over 240 Minutes Intravenous Every 8 hours 11/25/15 2218 11/28/15 0919   11/25/15 2200  vancomycin (VANCOCIN) 1,500 mg in sodium chloride 0.9 % 500 mL IVPB     1,500 mg 250 mL/hr over 120 Minutes Intravenous STAT 11/25/15 2135 11/25/15 2351   11/25/15 1900  piperacillin-tazobactam (ZOSYN) IVPB 3.375 g     3.375 g 100 mL/hr over 30 Minutes Intravenous  Once 11/25/15 1852 11/25/15 2042   11/25/15 1845  vancomycin (VANCOCIN) IVPB 1000 mg/200 mL premix     1,000 mg 200 mL/hr over 60 Minutes Intravenous  Once 11/25/15 1841 11/25/15 2150        Objective:   Vitals:   11/28/15 0602 11/28/15 1414 11/28/15 2142 11/29/15 0447  BP: 120/60 (!) 103/44 (!) 122/44 (!) 135/48  Pulse: 86 81 60 79  Resp: 20  18 18   Temp: 98 F (36.7 C) 98.6 F (37 C)  99.4 F (37.4 C) 98.3 F (36.8 C)  TempSrc: Oral Oral Oral Oral  SpO2: 98% 97% 99% 97%  Weight:      Height:        Wt Readings from Last 3 Encounters:  11/26/15 (!) 187.3 kg (413 lb)  08/22/15 (!) 186.4 kg (411 lb)  08/03/15 (!) 179.2 kg (395 lb)     Intake/Output Summary (Last 24 hours) at 11/29/15 [id number] Last data filed at 11/29/15 0700  Gross per 24 hour  Intake              660 ml  Output             2225 ml  Net            -1565 ml    Physical Exam  [id number] obese male not in distress  HEENT:  moist mucosa, supple neck Chest: clear b/l, no added sounds CVS: N S1&S2, no murmurs,  GI: soft, NT, ND, BS+ Musculoskeletal:Swelling With erythema over right leg (mainly involving the right calf),(Swelling with some erythema over the right ankle medially). Still rather tender to palpation but improving based on outlined markings.  Much improved since admission.   Data Review:    CBC  Recent Labs Lab 11/25/15 2239 11/26/15 0526 11/27/15 0603 11/28/15 0457 11/29/15 0515  WBC 36.0* 31.0* 23.4* 15.6* 10.4  HGB 11.8* 11.6* 11.3* 10.9* 11.0*  HCT 34.1* 34.1* 34.6* 32.7* 33.3*  PLT 234 232 221 253 270  MCV 81.0 81.8 84.0 83.4 82.0  MCH 28.0 27.8 27.4 27.8 27.1  MCHC 34.6 34.0 32.7 33.3 33.0  RDW 13.4 13.8 14.2 14.2 13.7  LYMPHSABS 1.1 1.4  --   --   --   MONOABS 1.8* 1.4*  --   --   --  EOSABS 0.0 0.0  --   --   --   BASOSABS 0.0 0.0  --   --   --     Chemistries   Recent Labs Lab 11/25/15 2052 11/26/15 0526 11/28/15 0457  NA 136 138 139  K 3.6 4.0 4.2  CL 104 107 108  CO2 20* 23 25  GLUCOSE 280* 294* 131*  BUN 20 22* 16  CREATININE 1.25* 0.97 0.84  CALCIUM 8.5* 7.9* 8.2*  AST 54* 86*  --   ALT 26 32  --   ALKPHOS 66 59  --   BILITOT 1.5* 1.1  --    Inpatient Medications  Scheduled Meds: . aspirin EC  81 mg Oral Daily  . carvedilol  6.25 mg Oral BID WC  .  ceFAZolin (ANCEF) IV  2 g Intravenous Q8H  . enoxaparin (LOVENOX) injection  90 mg  Subcutaneous Q24H  . famotidine  40 mg Oral BID  . furosemide  40 mg Intravenous BID  . insulin aspart  0-15 Units Subcutaneous TID WC  . insulin aspart protamine- aspart  110 Units Subcutaneous Q breakfast  . insulin aspart protamine- aspart  60 Units Subcutaneous Q supper  . linagliptin  5 mg Oral Daily  . meloxicam  15 mg Oral Daily  . morphine  60 mg Oral Q12H  . polyethylene glycol  17 g Oral Daily  . potassium chloride SA  20 mEq Oral Daily  . ramipril  10 mg Oral QAC breakfast  . rosuvastatin  20 mg Oral QAC breakfast  . senna-docusate  2 tablet Oral BID  . sertraline  150 mg Oral QAC breakfast  . sodium chloride flush  3 mL Intravenous Q12H   Continuous Infusions:   PRN Meds:.alum & mag hydroxide-simeth, ondansetron **OR** ondansetron (ZOFRAN) IV, oxyCODONE, senna-docusate  Micro Results Recent Results (from the past 240 hour(s))  Blood Culture (routine x 2)     Status: None (Preliminary result)   Collection Time: 11/25/15  8:39 PM  Result Value Ref Range Status   Specimen Description BLOOD RIGHT HAND  Final   Special Requests BOTTLES DRAWN AEROBIC AND ANAEROBIC 5CC  Final   Culture   Final    NO GROWTH 3 DAYS Performed at [facility]    Report Status PENDING  Incomplete  Blood Culture (routine x 2)     Status: None (Preliminary result)   Collection Time: 11/25/15  8:53 PM  Result Value Ref Range Status   Specimen Description BLOOD LEFT HAND  Final   Special Requests BOTTLES DRAWN AEROBIC ONLY 5CC  Final   Culture   Final    NO GROWTH 3 DAYS Performed at [facility]    Report Status PENDING  Incomplete  Urine culture     Status: Abnormal   Collection Time: 11/25/15  9:43 PM  Result Value Ref Range Status   Specimen Description URINE, CLEAN CATCH  Final   Special Requests NONE  Final   Culture MULTIPLE SPECIES PRESENT, SUGGEST RECOLLECTION (A)  Final   Report Status 11/27/2015 FINAL  Final  MRSA PCR Screening     Status: None   Collection  Time: 11/26/15  2:14 AM  Result Value Ref Range Status   MRSA by PCR NEGATIVE NEGATIVE Final   Radiology Reports Dg Tibia/fibula Right Result Date: 11/25/2015 Minimally displaced fracture of the right medial malleolus. Consider follow-up dedicated right ankle series. 2. No other acute osseous abnormality identified in the right tib-fib. Degenerative changes at the knee  and ankle. 3. Diffuse soft tissue swelling.  No subcutaneous gas.   Dg Ankle Complete Right Result Date: 11/25/2015 Small curvilinear, probably avulsion type, fracture fragment along the anterior medial malleolus. 2. Right ankle otherwise intact. 3. Diffuse right lower extremities soft tissue swelling.  Dg Chest Port 1 View Result Date: 11/25/2015 No active disease.   Time Spent in minutes  25  Faye Ramsay M.D on 11/29/2015 at 8:07 AM  Between 7am to 7pm - Pager - [phone]  After 7pm go to [email or web address] - password [facility]  [facility] -  Office  [phone]

## 2015-11-29 NOTE — Progress Notes (Signed)
Orthopedics Progress Note  Subjective: Patient reports minimal pain with WB on the right LE with ambulation  Objective:  Vitals:   11/29/15 0447 11/29/15 1300  BP: (!) 135/48 (!) 128/51  Pulse: 79 77  Resp: 18 18  Temp: 98.3 F (36.8 C) 98.1 F (36.7 C)    General: Awake and alert  Musculoskeletal: 3 plus pitting edema in the right LE with resolving but still present erythema. Min tender over MM Neurovascularly intact  Lab Results  Component Value Date   WBC 10.4 11/29/2015   HGB 11.0 (L) 11/29/2015   HCT 33.3 (L) 11/29/2015   MCV 82.0 11/29/2015   PLT 270 11/29/2015       Component Value Date/Time   NA 139 11/28/2015 0457   NA 142 06/21/2015 0828   K 4.2 11/28/2015 0457   K 4.2 06/21/2015 0828   CL 108 11/28/2015 0457   CL 98 12/22/2014 0751   CO2 25 11/28/2015 0457   CO2 25 06/21/2015 0828   GLUCOSE 131 (H) 11/28/2015 0457   GLUCOSE 129 06/21/2015 0828   GLUCOSE 314 (H) 12/22/2014 0751   BUN 16 11/28/2015 0457   BUN 11.7 06/21/2015 0828   CREATININE 0.84 11/28/2015 0457   CREATININE 0.9 06/21/2015 0828   CALCIUM 8.2 (L) 11/28/2015 0457   CALCIUM 9.0 06/21/2015 0828   GFRNONAA >60 11/28/2015 0457   GFRAA >60 11/28/2015 0457    No results found for: INR, PROTIME  Assessment/Plan: Patient with non-displaced small and stable avulsion type Medial Malleolus fracture right ankle.  This is a stable injury and with no real pain during weight bearing today, there is no indication for splinting or wrapping given the high chance for skin complications.  No need for orthopedic outpatient follow up unless patient having further symptoms.  Will sign off now, thank you!  Doran Heater. Veverly Fells, [place] [id number] 11/29/2015 6:39 PM

## 2015-11-29 NOTE — Evaluation (Signed)
Physical Therapy Evaluation Patient Details Name: Brandon Schaefer Today's Date: 11/29/2015   History of Present Illness  60 year old morbidly obese male with history of type 2 diabetes mellitus, peripheral neuropathy, hypertension anxiety, history of colon cancer brought to the ED by EMS after falling at home and sustained right medial malleolus fracture, also presents with R lower leg cellulitis  Clinical Impression  Pt admitted with above diagnosis. Pt currently with functional limitations due to the deficits listed below (see PT Problem List).  Pt will benefit from skilled PT to increase their independence and safety with mobility to allow discharge to the venue listed below.  Pt assisted OOB to bathroom and does not maintain TDWB well despite frequent cues.  Pt uncertain of mobility with splint (plans to splint with edema reduction) so he is not certain if he would function well at home.  Discussed SNF vs HHPT and pt reports he will need to see how well he is functioning once swelling improves.     Follow Up Recommendations SNF    Equipment Recommendations  None recommended by PT    Recommendations for Other Services       Precautions / Restrictions Precautions Precautions: Fall Restrictions Weight Bearing Restrictions: Yes RLE Weight Bearing: Touchdown weight bearing      Mobility  Bed Mobility Overal bed mobility: Needs Assistance Bed Mobility: Supine to Sit;Sit to Supine     Supine to sit: Supervision;HOB elevated Sit to supine: Mod assist   General bed mobility comments: requires UE support to rise and assist for LEs onto bed  Transfers Overall transfer level: Needs assistance Equipment used: Rolling walker (2 wheeled) Transfers: Sit to/from Stand Sit to Stand: Min guard            Ambulation/Gait Ambulation/Gait assistance: Min guard Ambulation Distance (Feet): 12 Feet Assistive device: Rolling walker (2 wheeled) Gait  Pattern/deviations: Step-through pattern;Wide base of support;Decreased stride length     General Gait Details: reviewed TDWB status and provided multiple verbal cues however pt does not adhere to this, encouraged WB through RW  Stairs            Wheelchair Mobility    Modified Rankin (Stroke Patients Only)       Balance                                             Pertinent Vitals/Pain Pain Assessment: 0-10 Pain Score: 7  Pain Location: R lower leg Pain Descriptors / Indicators: Sore;Tightness;Aching Pain Intervention(s): Limited activity within patient's tolerance;Repositioned;Monitored during session    [place] expects to be discharged to:: Private residence Living Arrangements: Alone   Type of Home: House       Home Layout: One level Home Equipment: Environmental consultant - 2 wheels      Prior Function Level of Independence: Independent               Hand Dominance        Extremity/Trunk Assessment               Lower Extremity Assessment: RLE deficits/detail RLE Deficits / Details: R lower leg edema    Cervical / Trunk Assessment: Other exceptions  Communication   Communication: No difficulties  Cognition Arousal/Alertness: Awake/alert Behavior During Therapy: WFL for tasks assessed/performed Overall Cognitive Status: Within Functional Limits for tasks assessed  General Comments      Exercises        Assessment/Plan    PT Assessment Patient needs continued PT services  PT Diagnosis Difficulty walking;Acute pain   PT Problem List Decreased strength;Decreased mobility;Pain;Decreased knowledge of precautions;Decreased skin integrity;Decreased activity tolerance  PT Treatment Interventions DME instruction;Gait training;Functional mobility training;Therapeutic activities;Patient/family education;Therapeutic exercise   PT Goals (Current goals can be found in the Care Plan  section) Acute Rehab PT Goals PT Goal Formulation: With patient Time For Goal Achievement: 12/06/15 Potential to Achieve Goals: Fair    Frequency Min 3X/week   Barriers to discharge        Co-evaluation               End of Session   Activity Tolerance: Patient limited by pain Patient left: in bed;with call bell/phone within reach Nurse Communication: Mobility status (requested barirecliner from portable equipment)         Time: [id number] PT Time Calculation (min) (ACUTE ONLY): 17 min   Charges:   PT Evaluation $PT Eval Moderate Complexity: 1 Procedure     PT G Codes:        Brandon Schaefer,Brandon Schaefer 11/29/2015, 1:28 PM Brandon Schaefer, PT, DPT 11/29/2015 Pager: [phone]

## 2015-11-30 LAB — CULTURE, BLOOD (ROUTINE X 2)
Culture: NO GROWTH
Culture: NO GROWTH

## 2015-11-30 LAB — BASIC METABOLIC PANEL
ANION GAP: 8 (ref 5–15)
BUN: 15 mg/dL (ref 6–20)
CALCIUM: 8.6 mg/dL — AB (ref 8.9–10.3)
CO2: 28 mmol/L (ref 22–32)
CREATININE: 0.69 mg/dL (ref 0.61–1.24)
Chloride: 104 mmol/L (ref 101–111)
Glucose, Bld: 113 mg/dL — ABNORMAL HIGH (ref 65–99)
Potassium: 3.6 mmol/L (ref 3.5–5.1)
SODIUM: 140 mmol/L (ref 135–145)

## 2015-11-30 LAB — CBC
HEMATOCRIT: 34.9 % — AB (ref 39.0–52.0)
Hemoglobin: 11.7 g/dL — ABNORMAL LOW (ref 13.0–17.0)
MCH: 27.3 pg (ref 26.0–34.0)
MCHC: 33.5 g/dL (ref 30.0–36.0)
MCV: 81.5 fL (ref 78.0–100.0)
PLATELETS: 294 10*3/uL (ref 150–400)
RBC: 4.28 MIL/uL (ref 4.22–5.81)
RDW: 13.6 % (ref 11.5–15.5)
WBC: 9.8 10*3/uL (ref 4.0–10.5)

## 2015-11-30 LAB — GLUCOSE, CAPILLARY
GLUCOSE-CAPILLARY: 135 mg/dL — AB (ref 65–99)
GLUCOSE-CAPILLARY: 103 mg/dL — AB (ref 65–99)
GLUCOSE-CAPILLARY: 130 mg/dL — AB (ref 65–99)
GLUCOSE-CAPILLARY: 129 mg/dL — AB (ref 65–99)

## 2015-11-30 MED ORDER — FLUCONAZOLE 100 MG PO TABS
100.0000 mg | ORAL_TABLET | Freq: Every day | ORAL | Status: DC
  Administered 2015-11-30 – 2015-12-02 (×3): 100 mg via ORAL
  Filled 2015-11-30 (×3): qty 1

## 2015-11-30 MED ORDER — MAGIC MOUTHWASH
10.0000 mL | Freq: Four times a day (QID) | ORAL | Status: DC
  Administered 2015-11-30 (×4): 10 mL via ORAL
  Filled 2015-11-30 (×5): qty 10

## 2015-11-30 NOTE — Progress Notes (Signed)
PROGRESS NOTE                                                                                                                                                                                                             Patient Demographics:    Brandon Schaefer, is a 60 y.o. male, DOB - Jul 02, 1955, [id number]  Admit date - 11/25/2015   Admitting Physician Reubin Milan, MD  Outpatient Primary MD for the patient is Binnie Rail, MD  LOS - 4   Brief Narrative   60 year old morbidly obese male with history of type 2 diabetes mellitus, peripheral neuropathy, hypertension anxiety, history of colon cancer brought to the ED by EMS after falling at home,  right lower leg swelling with tenderness and erythema.   In the ED he was septic with fever of 101F, tachypneic, soft blood pressure, leukocytosis of 36K and elevated lactic acid >4. Patient was found to have cellulitis of his right leg with x-ray showing avulsion fracture of the medial malleolus. Admitted to stepdown unit. Sepsis pathway initiated on admission.   Subjective:   Still with RLL pain, weakness. Overall better.    Assessment  & Plan :   Principal Problem: Sepsis ([place]) - Secondary to right lower leg cellulitis. Resolved.  Leukocytosis resolved, pt afebrile  - Cultures negative. Pt transitioned to Ancef.  - 2D echo shows normal EF and normal wall motion. Doppler right leg negative for DVT. - Pain control with oxycodone. - plan to transition to oral ABX in AM  Right medial malleolus fracture - Secondary to fall.  - Orthopedics following. Touchdown weight bearing if tolerated. Splint versus base once leg edema improves. - PT eval pending as pt has declined therapy, attempt today, pt encouraged to participate   Bilateral Leg edema secondary to chronic venous stasis changes, obesity peripheral vascular disease  - stable ECHO  - continue with lasix, aspirin     Uncontrolled DM (diabetes mellitus) ([place]) with neuropathies   - continue insulin protamine, linagliptin   Hypokalemia - supplemented and WNL   Peripheral vascular disease - Continue aspirin, statin.  Essential hypertension - reasonable inpatient control but this AM SBP in 150's, so will need to monitor closely   Morbid obesity - Body mass index is 56.01 kg/m.   Code Status : Full code   Family Communication  :  no family at bedside   Disposition Plan  : PT evaluation pending. Lives alone and likely benefits from going to SNF. PT has declined therapy yesterday, encouraged participation. He will think about SNF as discharge option.   Barriers For Discharge : Active symptoms.  Consults  :  [facility]   Procedures  : Doppler right lower leg 2D Echo  DVT Prophylaxis  : Lovenox SQ  Antibiotics  :   Anti-infectives    Start     Dose/Rate Route Frequency Ordered Stop   11/28/15 1400  ceFAZolin (ANCEF) IVPB 2g/100 mL premix     2 g 200 mL/hr over 30 Minutes Intravenous Every 8 hours 11/28/15 1044     11/26/15 0900  vancomycin (VANCOCIN) 1,250 mg in sodium chloride 0.9 % 250 mL IVPB  Status:  Discontinued     1,250 mg 166.7 mL/hr over 90 Minutes Intravenous Every 12 hours 11/25/15 2218 11/28/15 0919   11/26/15 0200  piperacillin-tazobactam (ZOSYN) IVPB 3.375 g  Status:  Discontinued     3.375 g 12.5 mL/hr over 240 Minutes Intravenous Every 8 hours 11/25/15 2218 11/28/15 0919   11/25/15 2200  vancomycin (VANCOCIN) 1,500 mg in sodium chloride 0.9 % 500 mL IVPB     1,500 mg 250 mL/hr over 120 Minutes Intravenous STAT 11/25/15 2135 11/25/15 2351   11/25/15 1900  piperacillin-tazobactam (ZOSYN) IVPB 3.375 g     3.375 g 100 mL/hr over 30 Minutes Intravenous  Once 11/25/15 1852 11/25/15 2042   11/25/15 1845  vancomycin (VANCOCIN) IVPB 1000 mg/200 mL premix     1,000 mg 200 mL/hr over 60 Minutes Intravenous  Once 11/25/15 1841 11/25/15 2150        Objective:    Vitals:   11/29/15 0447 11/29/15 1300 11/29/15 2102 11/30/15 0657  BP: (!) 135/48 (!) 128/51 140/62 (!) 154/73  Pulse: 79 77 73 75  Resp: 18 18 20 20   Temp: 98.3 F (36.8 C) 98.1 F (36.7 C) 99.1 F (37.3 C) 98.2 F (36.8 C)  TempSrc: Oral Oral Oral Oral  SpO2: 97% 98% 97% 98%  Weight:      Height:        Wt Readings from Last 3 Encounters:  11/26/15 (!) 187.3 kg (413 lb)  08/22/15 (!) 186.4 kg (411 lb)  08/03/15 (!) 179.2 kg (395 lb)     Intake/Output Summary (Last 24 hours) at 11/30/15 0715 Last data filed at 11/30/15 0347  Gross per 24 hour  Intake              680 ml  Output             2925 ml  Net            -2245 ml    Physical Exam  [id number] obese male not in distress  HEENT:  moist mucosa, supple neck Chest: clear b/l, no added sounds CVS: N S1&S2, no murmurs,  GI: soft, NT, ND, BS+ Musculoskeletal:Swelling With erythema over right leg (mainly involving the right calf),(Swelling with some erythema over the right ankle medially). Overall improving based on outlined markings.    Data Review:    CBC  Recent Labs Lab 11/25/15 2239 11/26/15 0526 11/27/15 0603 11/28/15 0457 11/29/15 0515 11/30/15 0509  WBC 36.0* 31.0* 23.4* 15.6* 10.4 9.8  HGB 11.8* 11.6* 11.3* 10.9* 11.0* 11.7*  HCT 34.1* 34.1* 34.6* 32.7* 33.3* 34.9*  PLT 234 232 221 253 270 294  MCV 81.0 81.8 84.0 83.4 82.0 81.5  MCH 28.0  27.8 27.4 27.8 27.1 27.3  MCHC 34.6 34.0 32.7 33.3 33.0 33.5  RDW 13.4 13.8 14.2 14.2 13.7 13.6  LYMPHSABS 1.1 1.4  --   --   --   --   MONOABS 1.8* 1.4*  --   --   --   --   EOSABS 0.0 0.0  --   --   --   --   BASOSABS 0.0 0.0  --   --   --   --     Chemistries   Recent Labs Lab 11/25/15 2052 11/26/15 0526 11/28/15 0457 11/30/15 0509  NA 136 138 139 140  K 3.6 4.0 4.2 3.6  CL 104 107 108 104  CO2 20* 23 25 28   GLUCOSE 280* 294* 131* 113*  BUN 20 22* 16 15  CREATININE 1.25* 0.97 0.84 0.69  CALCIUM 8.5* 7.9* 8.2* 8.6*  AST 54* 86*  --    --   ALT 26 32  --   --   ALKPHOS 66 59  --   --   BILITOT 1.5* 1.1  --   --    Inpatient Medications  Scheduled Meds: . aspirin EC  81 mg Oral Daily  . carvedilol  6.25 mg Oral BID WC  .  ceFAZolin (ANCEF) IV  2 g Intravenous Q8H  . enoxaparin (LOVENOX) injection  90 mg Subcutaneous Q24H  . famotidine  40 mg Oral BID  . furosemide  40 mg Intravenous BID  . insulin aspart  0-15 Units Subcutaneous TID WC  . insulin aspart protamine- aspart  110 Units Subcutaneous Q breakfast  . insulin aspart protamine- aspart  60 Units Subcutaneous Q supper  . linagliptin  5 mg Oral Daily  . meloxicam  15 mg Oral Daily  . morphine  60 mg Oral Q12H  . polyethylene glycol  17 g Oral Daily  . potassium chloride SA  20 mEq Oral Daily  . ramipril  10 mg Oral QAC breakfast  . rosuvastatin  20 mg Oral QAC breakfast  . senna-docusate  2 tablet Oral BID  . sertraline  150 mg Oral QAC breakfast  . sodium chloride flush  3 mL Intravenous Q12H   Continuous Infusions:   PRN Meds:.alum & mag hydroxide-simeth, ondansetron **OR** ondansetron (ZOFRAN) IV, oxyCODONE, senna-docusate  Micro Results Recent Results (from the past 240 hour(s))  Blood Culture (routine x 2)     Status: None (Preliminary result)   Collection Time: 11/25/15  8:39 PM  Result Value Ref Range Status   Specimen Description BLOOD RIGHT HAND  Final   Special Requests BOTTLES DRAWN AEROBIC AND ANAEROBIC 5CC  Final   Culture   Final    NO GROWTH 3 DAYS Performed at [facility]    Report Status PENDING  Incomplete  Blood Culture (routine x 2)     Status: None (Preliminary result)   Collection Time: 11/25/15  8:53 PM  Result Value Ref Range Status   Specimen Description BLOOD LEFT HAND  Final   Special Requests BOTTLES DRAWN AEROBIC ONLY 5CC  Final   Culture   Final    NO GROWTH 3 DAYS Performed at [facility]    Report Status PENDING  Incomplete  Urine culture     Status: Abnormal   Collection Time: 11/25/15  9:43  PM  Result Value Ref Range Status   Specimen Description URINE, CLEAN CATCH  Final   Special Requests NONE  Final   Culture MULTIPLE SPECIES PRESENT, SUGGEST RECOLLECTION (A)  Final   Report Status 11/27/2015 FINAL  Final  MRSA PCR Screening     Status: None   Collection Time: 11/26/15  2:14 AM  Result Value Ref Range Status   MRSA by PCR NEGATIVE NEGATIVE Final   Radiology Reports Dg Tibia/fibula Right Result Date: 11/25/2015 Minimally displaced fracture of the right medial malleolus. Consider follow-up dedicated right ankle series. 2. No other acute osseous abnormality identified in the right tib-fib. Degenerative changes at the knee and ankle. 3. Diffuse soft tissue swelling.  No subcutaneous gas.   Dg Ankle Complete Right Result Date: 11/25/2015 Small curvilinear, probably avulsion type, fracture fragment along the anterior medial malleolus. 2. Right ankle otherwise intact. 3. Diffuse right lower extremities soft tissue swelling.  Dg Chest Port 1 View Result Date: 11/25/2015 No active disease.   Time Spent in minutes  25  Faye Ramsay M.D on 11/30/2015 at 7:15 AM  Between 7am to 7pm - Pager - [phone]  After 7pm go to [email or web address] - password [facility]  [facility] -  Office  [phone]

## 2015-11-30 NOTE — NC FL2 (Signed)
[place] LEVEL OF CARE SCREENING TOOL     IDENTIFICATION  Patient Name: Brandon Schaefer Birthdate: 1955/10/16 Sex: male Admission Date (Current Location): 11/25/2015  [facility] and [place] Number:  Herbalist and Address:  [facility],  [place] [address], [place]      Provider Number: [id number]  Attending Physician Name and Address:  Theodis Blaze, MD  Relative Name and Phone Number:       Current Level of Care: Hospital Recommended Level of Care: [place] Prior Approval Number:    Date Approved/Denied:   PASRR Number: [id number]  Discharge Plan: Home    Current Diagnoses: Patient Active Problem List   Diagnosis Date Noted  . Pressure ulcer 11/27/2015  . Sepsis due to cellulitis ([place]) 11/26/2015  . Hypertension 11/26/2015  . Lactic acidosis 11/26/2015  . Cellulitis of right lower extremity 11/26/2015  . Right medial malleolar fracture 11/26/2015  . Anxiety 11/26/2015  . Hyperlipidemia 11/26/2015  . Bilateral leg edema 08/22/2015  . Dental abscess 08/03/2015  . Back pain 03/20/2015  . Anemia, iron deficiency 05/05/2012  . DM (diabetes mellitus) ([place]) 01/18/2012  . HTN (hypertension) 01/18/2012  . Morbid obesity with body mass index of 60.0-69.9 in adult ([place]) 01/17/2012  . Respiratory failure, post-operative ([place]) 01/17/2012  . Cancer of sigmoid colon ([place]) 12/23/2011  . Arthritis of knee, degenerative 08/09/2011  . Peripheral nerve disease ([place]) 01/01/2006    Orientation RESPIRATION BLADDER Height & Weight     Self, Time, Situation, Place  Normal Continent Weight: (!) 413 lb (187.3 kg) Height:  6' (182.9 cm)  BEHAVIORAL SYMPTOMS/MOOD NEUROLOGICAL BOWEL NUTRITION STATUS      Continent Diet (Heart Healthy/Carb Modified)  AMBULATORY STATUS COMMUNICATION OF NEEDS Skin   Extensive Assist Verbally Other (Comment) (Pressure Ulcer 11/26/15 Deep Tissue Injury - Purple or maroon localized area of  discolored intact skin or blood-filled blister due to damage of underlying soft tissue from pressure and/or shear. large area of purple tissue )                       Personal Care Assistance Level of Assistance  Bathing, Dressing Bathing Assistance: Limited assistance   Dressing Assistance: Limited assistance     Functional Limitations Info             Kevin  PT (By licensed PT), OT (By licensed OT)     PT Frequency: 5 OT Frequency: 5            Contractures      Additional Factors Info  Code Status, Allergies Code Status Info: Fullcode Allergies Info: Allergies:  Adhesive Tape           Current Medications (11/30/2015):  This is the current hospital active medication list Current Facility-Administered Medications  Medication Dose Route Frequency Provider Last Rate Last Dose  . alum & mag hydroxide-simeth (MAALOX/MYLANTA) 200-200-20 MG/5ML suspension 15 mL  15 mL Oral Q6H PRN Nishant Dhungel, MD   15 mL at 11/27/15 1542  . aspirin EC tablet 81 mg  81 mg Oral Daily Reubin Milan, MD   81 mg at 11/30/15 1002  . carvedilol (COREG) tablet 6.25 mg  6.25 mg Oral BID WC Reubin Milan, MD   6.25 mg at 11/30/15 0841  . ceFAZolin (ANCEF) IVPB 2g/100 mL premix  2 g Intravenous Q8H Nishant Dhungel, MD   2 g at 11/30/15 0555  . enoxaparin (LOVENOX)  injection 90 mg  90 mg Subcutaneous Q24H Reubin Milan, MD   90 mg at 11/30/15 0959  . famotidine (PEPCID) tablet 40 mg  40 mg Oral BID Reubin Milan, MD   40 mg at 11/30/15 1003  . fluconazole (DIFLUCAN) tablet 100 mg  100 mg Oral Daily Theodis Blaze, MD      . furosemide (LASIX) injection 40 mg  40 mg Intravenous BID Nishant Dhungel, MD   40 mg at 11/30/15 0846  . insulin aspart (novoLOG) injection 0-15 Units  0-15 Units Subcutaneous TID WC Reubin Milan, MD   2 Units at 11/30/15 [phone]  . insulin aspart protamine- aspart (NOVOLOG MIX 70/30) injection 110 Units  110 Units  Subcutaneous Q breakfast Nishant Dhungel, MD   110 Units at 11/30/15 0843  . insulin aspart protamine- aspart (NOVOLOG MIX 70/30) injection 60 Units  60 Units Subcutaneous Q supper Nishant Dhungel, MD   60 Units at 11/29/15 1758  . linagliptin (TRADJENTA) tablet 5 mg  5 mg Oral Daily Reubin Milan, MD   5 mg at 11/30/15 1004  . magic mouthwash  10 mL Oral QID Theodis Blaze, MD      . meloxicam [facility] tablet 15 mg  15 mg Oral Daily Reubin Milan, MD   15 mg at 11/30/15 1004  . morphine (MS CONTIN) 12 hr tablet 60 mg  60 mg Oral Q12H Reubin Milan, MD   60 mg at 11/30/15 1000  . ondansetron (ZOFRAN) tablet 4 mg  4 mg Oral Q6H PRN Reubin Milan, MD       Or  . ondansetron [facility]) injection 4 mg  4 mg Intravenous Q6H PRN Reubin Milan, MD      . oxyCODONE (Oxy IR/ROXICODONE) immediate release tablet 15 mg  15 mg Oral Q4H PRN Quintella Baton, MD   15 mg at 11/30/15 0555  . polyethylene glycol (MIRALAX / GLYCOLAX) packet 17 g  17 g Oral Daily Nishant Dhungel, MD   17 g at 11/28/15 0946  . potassium chloride SA (K-DUR,KLOR-CON) CR tablet 20 mEq  20 mEq Oral Daily Reubin Milan, MD   20 mEq at 11/30/15 1000  . ramipril (ALTACE) capsule 10 mg  10 mg Oral QAC breakfast Reubin Milan, MD   10 mg at 11/30/15 0841  . rosuvastatin (CRESTOR) tablet 20 mg  20 mg Oral QAC breakfast Reubin Milan, MD   20 mg at 11/30/15 0841  . senna-docusate (Senokot-S) tablet 1 tablet  1 tablet Oral BID PRN Reubin Milan, MD      . senna-docusate (Senokot-S) tablet 2 tablet  2 tablet Oral BID Louellen Molder, MD   2 tablet at 11/28/15 0943  . sertraline (ZOLOFT) tablet 150 mg  150 mg Oral QAC breakfast Reubin Milan, MD   150 mg at 11/30/15 0841  . sodium chloride flush (NS) 0.9 % injection 3 mL  3 mL Intravenous Q12H Reubin Milan, MD   3 mL at 11/30/15 1000     Discharge Medications: Please see discharge summary for a list of discharge medications.  Relevant Imaging  Results:  Relevant Lab Results:   Additional Information SSN: [id number]  Standley Brooking, LCSW

## 2015-11-30 NOTE — Clinical Social Work Placement (Signed)
   CLINICAL SOCIAL WORK PLACEMENT  NOTE  Date:  11/30/2015  Patient Details  Name: Brandon Schaefer MRN: [id number] Date of Birth: [birth year]  Clinical Social Work is seeking post-discharge placement for this patient at the [place] level of care (*CSW will initial, date and re-position this form in  chart as items are completed):  Yes   Patient/family provided with [facility] Work Department's list of facilities offering this level of care within the geographic area requested by the patient (or if unable, by the patient's family).  Yes   Patient/family informed of their freedom to choose among providers that offer the needed level of care, that participate in Medicare, Medicaid or managed care program needed by the patient, have an available bed and are willing to accept the patient.  Yes   Patient/family informed of [facility]'s ownership interest in [facility] and [facility], as well as of the fact that they are under no obligation to receive care at these facilities.  PASRR submitted to EDS on 11/30/15     PASRR number received on 11/30/15     Existing PASRR number confirmed on       FL2 transmitted to all facilities in geographic area requested by pt/family on 11/30/15     FL2 transmitted to all facilities within larger geographic area on       Patient informed that his/her managed care company has contracts with or will negotiate with certain facilities, including the following:            Patient/family informed of bed offers received.  Patient chooses bed at       Physician recommends and patient chooses bed at      Patient to be transferred to   on  .  Patient to be transferred to facility by       Patient family notified on   of transfer.  Name of family member notified:        PHYSICIAN       Additional Comment:    _______________________________________________ Standley Brooking, LCSW 11/30/2015, 11:52 AM

## 2015-11-30 NOTE — Clinical Social Work Note (Signed)
Clinical Social Work Assessment  Patient Details  Name: Brandon Schaefer MRN: [id number] Date of Birth: Aug 28, 1955  Date of referral:  11/30/15               Reason for consult:  Facility Placement                Permission sought to share information with:  Chartered certified accountant granted to share information::  Yes, Verbal Permission Granted  Name::        Agency::     Relationship::     Contact Information:     Housing/Transportation Living arrangements for the past 2 months:  Single Family Home Source of Information:  Patient Patient Interpreter Needed:  None Criminal Activity/Legal Involvement Pertinent to Current Situation/Hospitalization:  No - Comment as needed Significant Relationships:  Siblings Lives with:  Self Do you feel safe going back to the place where you live?  Yes Need for family participation in patient care:  No (Coment)  Care giving concerns:  CSW reviewed PT evaluation recommending SNF at discharge.    Social Worker assessment / plan:  CSW met with patient to discuss discharge planning, patient states that he is still on the fence about going to SNF or just returning home. Patient did agree to have information sent out to [facility] to see which facilities have availability and are in-network with his insurance. CSW will follow-up with bed offers when available.   Employment status:  Therapist, music:  Managed Care PT Recommendations:  [place] / Referral to community resources:  [place]  Patient/Family's Response to care:  Patient seemed hesitant to agree to SNF placement, but agreed to have information sent out.   Patient/Family's Understanding of and Emotional Response to Diagnosis, Current Treatment, and Prognosis:    Emotional Assessment Appearance:  Appears stated age Attitude/Demeanor/Rapport:    Affect (typically observed):  Guarded Orientation:  Oriented  to Self, Oriented to Place, Oriented to  Time, Oriented to Situation Alcohol / Substance use:    Psych involvement (Current and /or in the community):     Discharge Needs  Concerns to be addressed:    Readmission within the last 30 days:    Current discharge risk:    Barriers to Discharge:      Standley Brooking, LCSW 11/30/2015, 11:48 AM

## 2015-12-01 ENCOUNTER — Inpatient Hospital Stay (HOSPITAL_COMMUNITY): Payer: 59

## 2015-12-01 LAB — BASIC METABOLIC PANEL
Anion gap: 8 (ref 5–15)
BUN: 14 mg/dL (ref 6–20)
CALCIUM: 8.6 mg/dL — AB (ref 8.9–10.3)
CHLORIDE: 103 mmol/L (ref 101–111)
CO2: 29 mmol/L (ref 22–32)
CREATININE: 0.9 mg/dL (ref 0.61–1.24)
Glucose, Bld: 107 mg/dL — ABNORMAL HIGH (ref 65–99)
Potassium: 3.4 mmol/L — ABNORMAL LOW (ref 3.5–5.1)
SODIUM: 140 mmol/L (ref 135–145)

## 2015-12-01 LAB — CBC
HCT: 35.8 % — ABNORMAL LOW (ref 39.0–52.0)
HEMOGLOBIN: 12 g/dL — AB (ref 13.0–17.0)
MCH: 27.3 pg (ref 26.0–34.0)
MCHC: 33.5 g/dL (ref 30.0–36.0)
MCV: 81.4 fL (ref 78.0–100.0)
PLATELETS: 313 10*3/uL (ref 150–400)
RBC: 4.4 MIL/uL (ref 4.22–5.81)
RDW: 13.5 % (ref 11.5–15.5)
WBC: 12.6 10*3/uL — ABNORMAL HIGH (ref 4.0–10.5)

## 2015-12-01 LAB — GLUCOSE, CAPILLARY
GLUCOSE-CAPILLARY: 115 mg/dL — AB (ref 65–99)
GLUCOSE-CAPILLARY: 111 mg/dL — AB (ref 65–99)
Glucose-Capillary: 121 mg/dL — ABNORMAL HIGH (ref 65–99)
Glucose-Capillary: 116 mg/dL — ABNORMAL HIGH (ref 65–99)

## 2015-12-01 MED ORDER — MAGIC MOUTHWASH W/LIDOCAINE
10.0000 mL | Freq: Four times a day (QID) | ORAL | Status: DC
  Administered 2015-12-01 – 2015-12-02 (×5): 10 mL via ORAL
  Filled 2015-12-01 (×7): qty 10

## 2015-12-01 MED ORDER — DOXYCYCLINE HYCLATE 100 MG PO TABS
100.0000 mg | ORAL_TABLET | Freq: Two times a day (BID) | ORAL | Status: DC
  Administered 2015-12-01 – 2015-12-02 (×3): 100 mg via ORAL
  Filled 2015-12-01 (×3): qty 1

## 2015-12-01 MED ORDER — DIATRIZOATE MEGLUMINE & SODIUM 66-10 % PO SOLN: 15.0000 mL | Freq: Every day | ORAL | Status: DC | PRN

## 2015-12-01 MED ORDER — POLYETHYLENE GLYCOL 3350 17 G PO PACK: 17.0000 g | PACK | Freq: Every day | ORAL | Status: DC | PRN

## 2015-12-01 MED ORDER — POTASSIUM CHLORIDE CRYS ER 20 MEQ PO TBCR
40.0000 meq | EXTENDED_RELEASE_TABLET | Freq: Once | ORAL | Status: AC
  Administered 2015-12-01: 40 meq via ORAL
  Filled 2015-12-01: qty 2

## 2015-12-01 NOTE — Clinical Social Work Placement (Signed)
CSW provided patient with SNF bed offers. Patient states that if he were to go to SNF, he would want [facility]. CSW informed him that [facility] had offered a bed, but would need to plan ahead to order a bariatric bed prior to patient discharging. Patient states now that he would prefer to just return home, rather than SNF. RNCM, Cookie made aware.    Raynaldo Opitz, [facility] Clinical Social Worker cell #: [phone]     CLINICAL SOCIAL WORK PLACEMENT  NOTE  Date:  12/01/2015  Patient Details  Name: Brandon Schaefer MRN: [id number] Date of Birth: 1956-01-30  Clinical Social Work is seeking post-discharge placement for this patient at the [place] level of care (*CSW will initial, date and re-position this form in  chart as items are completed):  Yes   Patient/family provided with [place] [facility] Department's list of facilities offering this level of care within the geographic area requested by the patient (or if unable, by the patient's family).  Yes   Patient/family informed of their freedom to choose among providers that offer the needed level of care, that participate in Medicare, Medicaid or managed care program needed by the patient, have an available bed and are willing to accept the patient.  Yes   Patient/family informed of [place]'s ownership interest in [facility] and [facility], as well as of the fact that they are under no obligation to receive care at these facilities.  PASRR submitted to EDS on 11/30/15     PASRR number received on 11/30/15     Existing PASRR number confirmed on       FL2 transmitted to all facilities in geographic area requested by pt/family on 11/30/15     FL2 transmitted to all facilities within larger geographic area on       Patient informed that his/her managed care company has contracts with or will negotiate with certain facilities, including the following:        Yes    Patient/family informed of bed offers received.  Patient chooses bed at       Physician recommends and patient chooses bed at      Patient to be transferred to   on  .  Patient to be transferred to facility by       Patient family notified on   of transfer.  Name of family member notified:        PHYSICIAN       Additional Comment:    _______________________________________________ Standley Brooking, LCSW 12/01/2015, 12:16 PM

## 2015-12-01 NOTE — Progress Notes (Signed)
Physical Therapy Treatment Patient Details Name: Brandon Schaefer MRN: [id number] DOB: [birth year] Today's Date: 12/01/2015    History of Present Illness 60 year old morbidly obese male with history of type 2 diabetes mellitus, peripheral neuropathy, hypertension anxiety, history of colon cancer brought to the ED by EMS after falling at home and sustained right medial malleolus fracture, also presents with R lower leg cellulitis    PT Comments    Pt reports overall feeling better.  Pt able to tolerate short ambulation distance well and states his home is being rearranged so his recliner is closer to the bathroom.  Pt reported on evaluation that he frequently sleeps in his lift chair.     Follow Up Recommendations  Home health PT     Equipment Recommendations  None recommended by PT    Recommendations for Other Services       Precautions / Restrictions Precautions Precautions: Fall Restrictions RLE Weight Bearing: Touchdown weight bearing Other Position/Activity Restrictions: TDWB? per notes however pt states with last ortho MD visit he was told no WBing restrictions with RW    Mobility  Bed Mobility               General bed mobility comments: pt up in recliner on arrival  Transfers Overall transfer level: Needs assistance Equipment used: Rolling walker (2 wheeled) Transfers: Sit to/from Stand Sit to Stand: Supervision            Ambulation/Gait Ambulation/Gait assistance: Supervision Ambulation Distance (Feet): 60 Feet Assistive device: Rolling walker (2 wheeled) Gait Pattern/deviations: Step-through pattern;Wide base of support;Decreased stride length     General Gait Details: pt reports only 2/10 pain in R lower leg during ambulation, performs mobility with WBAT, encouraged UE WBing through RW   Stairs            Wheelchair Mobility    Modified Rankin (Stroke Patients Only)       Balance                                     Cognition Arousal/Alertness: Awake/alert Behavior During Therapy: WFL for tasks assessed/performed Overall Cognitive Status: Within Functional Limits for tasks assessed                      Exercises      General Comments        Pertinent Vitals/Pain Pain Assessment: 0-10 Pain Score: 2  Pain Location: R lower leg Pain Descriptors / Indicators: Sore Pain Intervention(s): Limited activity within patient's tolerance;Monitored during session;Repositioned    Home Living                      Prior Function            PT Goals (current goals can now be found in the care plan section) Progress towards PT goals: Progressing toward goals    Frequency  Min 3X/week    PT Plan Discharge plan needs to be updated    Co-evaluation             End of Session   Activity Tolerance: Patient tolerated treatment well Patient left: in chair;with call bell/phone within reach     Time: 1345-1358 PT Time Calculation (min) (ACUTE ONLY): 13 min  Charges:  $Gait Training: 8-22 mins  G Codes:      Wataru Mccowen,KATHrine E December 18, 2015, 2:27 PM Carmelia Bake, PT, DPT 12/18/15 Pager: [phone]

## 2015-12-01 NOTE — Progress Notes (Signed)
Pt had no preference for HH.  [facility] referral was given.

## 2015-12-01 NOTE — Progress Notes (Addendum)
PROGRESS NOTE                                                                                                                                                                                                             Patient Demographics:    Brandon Schaefer, is a 60 y.o. male, DOB - [birth year], [id number]  Admit date - 11/25/2015   Admitting Physician Reubin Milan, MD  Outpatient Primary MD for the patient is Binnie Rail, MD  LOS - 5   Brief Narrative   61 year old morbidly obese male with history of type 2 diabetes mellitus, peripheral neuropathy, hypertension anxiety, history of colon cancer brought to the ED by EMS after falling at home,  right lower leg swelling with tenderness and erythema.   In the ED he was septic with fever of 101F, tachypneic, soft blood pressure, leukocytosis of 36K and elevated lactic acid >4.  Patient was found to have cellulitis of his right leg with x-ray showing avulsion fracture of the medial malleolus. Admitted to stepdown unit. Sepsis pathway initiated on admission.   Subjective:   Still with RLL pain, weakness. Overall better.    Assessment  & Plan :   Principal Problem: Sepsis ([place]) - Secondary to right lower leg cellulitis. Resolved.  Pt remains afebrile for over 48 hours  - Cultures negative. Pt transitioned to Ancef.  - 2D echo shows normal EF and normal wall motion. Doppler right leg negative for DVT. - Pain control with oxycodone. Asked pt to keep extremity elevated to help with healing process and he says it is too painful for him to do but can do 15 minutes at the time  - transition to oral doxycycline until 8/21  Abd pain, acute and lower abd quadrants  - with some tenderness with soft palpation as well as deep palpation - WBC up as well and pt says diarrhea  - will stop stool softeners and if diarrhea persists will need C. Diff rule out - for now, proceed  with CT abd to rule out other infectious etiologies   Right medial malleolus fracture - Secondary to fall.  - Orthopedics following. Touchdown weight bearing if tolerated. Splint versus base once leg edema improves. - PT eval done, SNF recommended, when we asked pt about SNF he got very emotional and says he is still thinking  about it   Oral thrush - continue diflucan day #2 - continue magic mouthwash   Bilateral Leg edema secondary to chronic venous stasis changes, obesity peripheral vascular disease  - stable ECHO  - continue with lasix, aspirin   Uncontrolled DM (diabetes mellitus) ([place]) with neuropathies   - continue insulin protamine, linagliptin, SSI  Hypokalemia - continue to supplement and repeat BMP in AM  Peripheral vascular disease - Continue aspirin, statin.  Essential hypertension - reasonable inpatient control but this AM SBP in 150's, so will need to monitor closely   Morbid obesity - Body mass index is 56.01 kg/m.   Code Status : Full code   Family Communication  : no family at bedside   Disposition Plan  : Depending on CT abd results and once bed available, pt should be OK to go home in next 24 - 48 hours   Barriers For Discharge : Abd CT pending and placement to SNF  Consults  :  [facility] orthopedics - signed off   Procedures  : Doppler right lower leg 2D Echo  DVT Prophylaxis  : Lovenox SQ  Antibiotics  :  Vancomycin and zosyn 8/12 - 8/15 Ancef 8/15 --> 8/18 Doxycycline 8/18 --> 8/21 Diflucan 8/17 -->     Objective:   Vitals:   11/30/15 0657 11/30/15 1330 11/30/15 2105 12/01/15 0445  BP: (!) 154/73 (!) 120/54 (!) 144/62 (!) 128/51  Pulse: 75 75 72 72  Resp: 20 20 20 20   Temp: 98.2 F (36.8 C) 98.6 F (37 C) 98.4 F (36.9 C) 98.3 F (36.8 C)  TempSrc: Oral Oral Oral Oral  SpO2: 98% 97% 97% 94%  Weight:      Height:        Wt Readings from Last 3 Encounters:  11/26/15 (!) 187.3 kg (413 lb)  08/22/15 (!) 186.4 kg (411 lb)    08/03/15 (!) 179.2 kg (395 lb)     Intake/Output Summary (Last 24 hours) at 12/01/15 0733 Last data filed at 12/01/15 0609  Gross per 24 hour  Intake              900 ml  Output             1450 ml  Net             -550 ml    Physical Exam  [id number] obese male not in distress  HEENT:  moist mucosa, supple neck Chest: clear b/l, no added sounds CVS: N S1&S2, no murmurs,  GI: soft, tender in lower abd quadrants with soft and deep palpation  Musculoskeletal:Swelling With erythema over right leg significantly improved, more warm to touch in the posterior part    Data Review:    CBC  Recent Labs Lab 11/25/15 2239 11/26/15 0526 11/27/15 0603 11/28/15 0457 11/29/15 0515 11/30/15 0509 12/01/15 0518  WBC 36.0* 31.0* 23.4* 15.6* 10.4 9.8 12.6*  HGB 11.8* 11.6* 11.3* 10.9* 11.0* 11.7* 12.0*  HCT 34.1* 34.1* 34.6* 32.7* 33.3* 34.9* 35.8*  PLT 234 232 221 253 270 294 313  MCV 81.0 81.8 84.0 83.4 82.0 81.5 81.4  MCH 28.0 27.8 27.4 27.8 27.1 27.3 27.3  MCHC 34.6 34.0 32.7 33.3 33.0 33.5 33.5  RDW 13.4 13.8 14.2 14.2 13.7 13.6 13.5  LYMPHSABS 1.1 1.4  --   --   --   --   --   MONOABS 1.8* 1.4*  --   --   --   --   --   EOSABS 0.0  0.0  --   --   --   --   --   BASOSABS 0.0 0.0  --   --   --   --   --     Chemistries   Recent Labs Lab 11/25/15 2052 11/26/15 0526 11/28/15 0457 11/30/15 0509 12/01/15 0518  NA 136 138 139 140 140  K 3.6 4.0 4.2 3.6 3.4*  CL 104 107 108 104 103  CO2 20* 23 25 28 29   GLUCOSE 280* 294* 131* 113* 107*  BUN 20 22* 16 15 14   CREATININE 1.25* 0.97 0.84 0.69 0.90  CALCIUM 8.5* 7.9* 8.2* 8.6* 8.6*  AST 54* 86*  --   --   --   ALT 26 32  --   --   --   ALKPHOS 66 59  --   --   --   BILITOT 1.5* 1.1  --   --   --    Inpatient Medications  Scheduled Meds: . aspirin EC  81 mg Oral Daily  . carvedilol  6.25 mg Oral BID WC  .  ceFAZolin (ANCEF) IV  2 g Intravenous Q8H  . enoxaparin (LOVENOX) injection  90 mg Subcutaneous Q24H  .  famotidine  40 mg Oral BID  . fluconazole  100 mg Oral Daily  . furosemide  40 mg Intravenous BID  . insulin aspart  0-15 Units Subcutaneous TID WC  . insulin aspart protamine- aspart  110 Units Subcutaneous Q breakfast  . insulin aspart protamine- aspart  60 Units Subcutaneous Q supper  . linagliptin  5 mg Oral Daily  . magic mouthwash  10 mL Oral QID  . meloxicam  15 mg Oral Daily  . morphine  60 mg Oral Q12H  . polyethylene glycol  17 g Oral Daily  . potassium chloride SA  20 mEq Oral Daily  . ramipril  10 mg Oral QAC breakfast  . rosuvastatin  20 mg Oral QAC breakfast  . senna-docusate  2 tablet Oral BID  . sertraline  150 mg Oral QAC breakfast  . sodium chloride flush  3 mL Intravenous Q12H   Continuous Infusions:   PRN Meds:.alum & mag hydroxide-simeth, ondansetron **OR** ondansetron (ZOFRAN) IV, oxyCODONE, senna-docusate  Micro Results Recent Results (from the past 240 hour(s))  Blood Culture (routine x 2)     Status: None (Preliminary result)   Collection Time: 11/25/15  8:39 PM  Result Value Ref Range Status   Specimen Description BLOOD RIGHT HAND  Final   Special Requests BOTTLES DRAWN AEROBIC AND ANAEROBIC 5CC  Final   Culture   Final    NO GROWTH 3 DAYS Performed at [facility]    Report Status PENDING  Incomplete  Blood Culture (routine x 2)     Status: None (Preliminary result)   Collection Time: 11/25/15  8:53 PM  Result Value Ref Range Status   Specimen Description BLOOD LEFT HAND  Final   Special Requests BOTTLES DRAWN AEROBIC ONLY 5CC  Final   Culture   Final    NO GROWTH 3 DAYS Performed at [facility]    Report Status PENDING  Incomplete  Urine culture     Status: Abnormal   Collection Time: 11/25/15  9:43 PM  Result Value Ref Range Status   Specimen Description URINE, CLEAN CATCH  Final   Special Requests NONE  Final   Culture MULTIPLE SPECIES PRESENT, SUGGEST RECOLLECTION (A)  Final   Report Status 11/27/2015 FINAL  Final  MRSA  PCR Screening     Status: None   Collection Time: 11/26/15  2:14 AM  Result Value Ref Range Status   MRSA by PCR NEGATIVE NEGATIVE Final   Radiology Reports Dg Tibia/fibula Right Result Date: 11/25/2015 Minimally displaced fracture of the right medial malleolus. Consider follow-up dedicated right ankle series. 2. No other acute osseous abnormality identified in the right tib-fib. Degenerative changes at the knee and ankle. 3. Diffuse soft tissue swelling.  No subcutaneous gas.   Dg Ankle Complete Right Result Date: 11/25/2015 Small curvilinear, probably avulsion type, fracture fragment along the anterior medial malleolus. 2. Right ankle otherwise intact. 3. Diffuse right lower extremities soft tissue swelling.  Dg Chest Port 1 View Result Date: 11/25/2015 No active disease.   Time Spent in minutes  25  Faye Ramsay M.D on 12/01/2015 at 7:33 AM  Between 7am to 7pm - Pager - [phone]  After 7pm go to [email or web address] - password [facility]  [facility] -  Office  [phone]

## 2015-12-02 DIAGNOSIS — Z794 Long term (current) use of insulin: Secondary | ICD-10-CM

## 2015-12-02 DIAGNOSIS — E872 Acidosis: Secondary | ICD-10-CM

## 2015-12-02 DIAGNOSIS — L03115 Cellulitis of right lower limb: Secondary | ICD-10-CM

## 2015-12-02 DIAGNOSIS — A419 Sepsis, unspecified organism: Principal | ICD-10-CM

## 2015-12-02 DIAGNOSIS — E119 Type 2 diabetes mellitus without complications: Secondary | ICD-10-CM

## 2015-12-02 DIAGNOSIS — L0390 Cellulitis, unspecified: Secondary | ICD-10-CM

## 2015-12-02 LAB — BASIC METABOLIC PANEL
ANION GAP: 7 (ref 5–15)
BUN: 18 mg/dL (ref 6–20)
CALCIUM: 8.6 mg/dL — AB (ref 8.9–10.3)
CHLORIDE: 102 mmol/L (ref 101–111)
CO2: 30 mmol/L (ref 22–32)
Creatinine, Ser: 1.02 mg/dL (ref 0.61–1.24)
GFR calc non Af Amer: >60 mL/min (ref >60)
GLUCOSE: 109 mg/dL — AB (ref 65–99)
POTASSIUM: 3.9 mmol/L (ref 3.5–5.1)
Sodium: 139 mmol/L (ref 135–145)

## 2015-12-02 LAB — GLUCOSE, CAPILLARY
GLUCOSE-CAPILLARY: 130 mg/dL — AB (ref 65–99)
Glucose-Capillary: 125 mg/dL — ABNORMAL HIGH (ref 65–99)

## 2015-12-02 LAB — CBC
HEMATOCRIT: 37.3 % — AB (ref 39.0–52.0)
HEMOGLOBIN: 12.3 g/dL — AB (ref 13.0–17.0)
MCH: 27 pg (ref 26.0–34.0)
MCHC: 33 g/dL (ref 30.0–36.0)
MCV: 82 fL (ref 78.0–100.0)
Platelets: 326 10*3/uL (ref 150–400)
RBC: 4.55 MIL/uL (ref 4.22–5.81)
RDW: 13.7 % (ref 11.5–15.5)
WBC: 12.8 10*3/uL — ABNORMAL HIGH (ref 4.0–10.5)

## 2015-12-02 MED ORDER — FLUCONAZOLE 100 MG PO TABS: 100.0000 mg | ORAL_TABLET | Freq: Every day | ORAL | 0 refills | Status: DC

## 2015-12-02 MED ORDER — FUROSEMIDE 40 MG PO TABS: 40.0000 mg | ORAL_TABLET | Freq: Every day | ORAL | Status: DC

## 2015-12-02 MED ORDER — DOXYCYCLINE HYCLATE 100 MG PO TABS: 100.0000 mg | ORAL_TABLET | Freq: Two times a day (BID) | ORAL | 0 refills | Status: DC

## 2015-12-02 MED ORDER — MAGIC MOUTHWASH W/LIDOCAINE: 10.0000 mL | Freq: Four times a day (QID) | ORAL | 0 refills | Status: DC | PRN

## 2015-12-02 MED ORDER — FAMOTIDINE 40 MG PO TABS: 40.0000 mg | ORAL_TABLET | Freq: Two times a day (BID) | ORAL | 0 refills | Status: DC

## 2015-12-02 MED ORDER — INSULIN LISPRO PROT & LISPRO (75-25 MIX) 100 UNIT/ML ~~LOC~~ SUSP: SUBCUTANEOUS | 11 refills | Status: DC

## 2015-12-02 NOTE — Progress Notes (Signed)
Patient refusing Lasix tablet this morning, despite being educated on importance of taking. He states he will take it when he gets home. Patient says he will be discharged today and does not want to have to stop to use a restroom on the way home.

## 2015-12-02 NOTE — Progress Notes (Signed)
IV access removed. Discharge instructions discussed with patient. Patient states understanding, via teach-back. 5 prescriptions given. All questions answered. Social work has arranged home health with [place] and patient made aware of their contact information. Patient discharged home via wheelchair with 2 person assist. Taken to brothers personal vehicle. Is stable condition at time of discharge.

## 2015-12-02 NOTE — Discharge Summary (Signed)
Physician Discharge Summary  Brandon Schaefer [id number] DOB: 1955-06-24 DOA: 11/25/2015  PCP: Binnie Rail, MD  Admit date: 11/25/2015 Discharge date: 12/02/2015   Recommendations for Outpatient Follow-Up:   Finish abx course Home health Patient needs to keep leg elevated   Discharge Diagnosis:   Principal Problem:   Sepsis due to cellulitis [facility]) Active Problems:   DM (diabetes mellitus) ([place])   HTN (hypertension)   Peripheral nerve disease ([place])   Arthritis of knee, degenerative   Back pain   Lactic acidosis   Cellulitis of right lower extremity   Right medial malleolar fracture   Anxiety   Hyperlipidemia   Pressure ulcer   Discharge disposition:  Home- refused SNF  Discharge Condition: Improved.  Diet recommendation: Low sodium, heart healthy.  Carbohydrate-modified  Wound care: None.   History of Present Illness:   Brandon Schaefer is a 60 y.o. male with medical history significant of allergy, anemia, anxiety, colon cancer, type 2 diabetes, obesity, hyperlipidemia, diabetic peripheral neuropathy, hypertension who was brought to the emergency department via EMS after having a fall at home.  Per patient, he noticed that his right lower extremity was having tenderness, erythema, edema and calor since Friday. He denies any history of trauma prior to symptoms, which he states have gotten worse Saturday. He states that he felt nauseous since Saturday morning, did not have much oral intake and did not take his medications for the day. He mentions that he had 2 episodes of emesis earlier in the day, but denies abdominal pain, diarrhea, melena or hematochezia. He denies GU symptoms.   He says that in the afternoon he felt very fatigued and weak. Due to this weakness, he subsequently fell, but denies any injury. He denies LOC, chest pain, palpitations, dizziness, diaphoresis, worsening dyspnea prior or after having the fall.    Hospital Course by Problem:   Sepsis  (Standard) - Secondary to right lower leg cellulitis. Resolved.  Pt remains afebrile for over 48 hours  - Cultures negative. Pt transitioned to Ancef.  - 2D echo shows normal EF and normal wall motion. Doppler right leg negative for DVT. - Pain control with oxycodone. Asked pt to keep extremity elevated to help with healing process and he says it is too painful for him to do but can do 15 minutes at the time  - transition to oral doxycycline  Abd pain, acute and lower abd quadrants  - with some tenderness with soft palpation as well as deep palpation - WBC up as well and pt says diarrhea   Right medial malleolus fracture - Secondary to fall.  - Orthopedics following. Touchdown weight bearing if tolerated. Splint versus base once leg edema improves. - PT eval done, SNF recommended but patient refused  Oral thrush - continue diflucan day #2/7 - continue magic mouthwash   Bilateral Leg edema secondary to chronic venous stasis changes, obesity peripheral vascular disease  - stable ECHO  - continue with lasix, aspirin   Uncontrolled DM (diabetes mellitus) ([place]) with neuropathies   - continue insulin protamine, linagliptin, SSI  Hypokalemia - replete  Peripheral vascular disease - Continue aspirin, statin.  Essential hypertension - reasonable inpatient control but this AM SBP in 150's, so will need to monitor closely   Morbid obesity - Body mass index is 56.01 kg/m.     Medical Consultants:    ortho   Discharge Exam:   Vitals:   12/01/15 2152 12/02/15 0428  BP: 113/61 122/60  Pulse: 77 78  Resp: 20 18  Temp: 99.4 F (37.4 C) 98.1 F (36.7 C)   Vitals:   12/01/15 0834 12/01/15 1410 12/01/15 2152 12/02/15 0428  BP: (!) 142/59 (!) 120/49 113/61 122/60  Pulse:  77 77 78  Resp:  18 20 18   Temp:  98 F (36.7 C) 99.4 F (37.4 C) 98.1 F (36.7 C)  TempSrc:  Oral Oral Oral  SpO2:  96% 95% 97%  Weight:      Height:        Gen:  NAD    The results of  significant diagnostics from this hospitalization (including imaging, microbiology, ancillary and laboratory) are listed below for reference.     Procedures and Diagnostic Studies:   Dg Tibia/fibula Right  Result Date: 11/25/2015 CLINICAL DATA:  60 year old male who fell from standing. Right lower extremity erythema and pain. Cellulitis suspected. Initial encounter. EXAM: RIGHT TIBIA AND FIBULA - 2 VIEW COMPARISON:  None. FINDINGS: Partially visible advanced degenerative changes at the right knee. Severe medial compartment joint space loss. Proximal tibia appears intact. Proximal fibula appears intact. Right tibia and fibula shafts appear intact. Distal right fibula appears intact, but there is a minimally displaced fracture of the anterior medial malleolus (arrows). Right mortise joint alignment appears preserved on these images. Grossly intact talus and calcaneus with degenerative spurring. Diffuse soft tissue swelling and stranding compatible with widespread soft tissue inflammation. No subcutaneous gas. IMPRESSION: 1. Minimally displaced fracture of the right medial malleolus. Consider follow-up dedicated right ankle series. 2. No other acute osseous abnormality identified in the right tib-fib. Degenerative changes at the knee and ankle. 3. Diffuse soft tissue swelling.  No subcutaneous gas. Electronically Signed   By: Genevie Ann M.D.   On: 11/25/2015 19:14   Dg Ankle Complete Right  Result Date: 11/25/2015 CLINICAL DATA:  61 year old male who fell from standing with evidence of medial malleolus fracture on tib-fib series today. Initial encounter. EXAM: RIGHT ANKLE - COMPLETE 3+ VIEW COMPARISON:  Right tib-fib series 18 41 hours today. FINDINGS: Diffuse soft tissue swelling about the visible right lower extremity. Mortise joint alignment preserved. Small curvilinear fracture fragment along the anterior medial malleolus re - demonstrated and appears stable from the earlier exam. Talar dome intact. Distal  fibula intact. Calcaneus intact with bulky degenerative spurring and dystrophic calcification. Dorsal midfoot degenerative spurring. No other No acute osseous abnormality identified. IMPRESSION: 1. Small curvilinear, probably avulsion type, fracture fragment along the anterior medial malleolus. 2. Right ankle otherwise intact. 3. Diffuse right lower extremities soft tissue swelling. Electronically Signed   By: Genevie Ann M.D.   On: 11/25/2015 19:56   Dg Chest Port 1 View  Result Date: 11/25/2015 CLINICAL DATA:  Fever and shortness of breath. Recent fall. Colon carcinoma. EXAM: PORTABLE CHEST 1 VIEW COMPARISON:  03/19/2012 FINDINGS: The heart size and mediastinal contours are within normal limits. Both lungs are clear. The visualized skeletal structures are unremarkable. IMPRESSION: No active disease. Electronically Signed   By: Earle Gell M.D.   On: 11/25/2015 19:10     Labs:   Basic Metabolic Panel:  Recent Labs Lab 11/26/15 0526 11/28/15 0457 11/30/15 0509 12/01/15 0518 12/02/15 0515  NA 138 139 140 140 139  K 4.0 4.2 3.6 3.4* 3.9  CL 107 108 104 103 102  CO2 23 25 28 29 30   GLUCOSE 294* 131* 113* 107* 109*  BUN 22* 16 15 14 18   CREATININE 0.97 0.84 0.69 0.90 1.02  CALCIUM 7.9* 8.2* 8.6* 8.6* 8.6*  GFR Estimated Creatinine Clearance: 130.7 mL/min (by C-G formula based on SCr of 1.02 mg/dL). Liver Function Tests:  Recent Labs Lab 11/25/15 2052 11/26/15 0526  AST 54* 86*  ALT 26 32  ALKPHOS 66 59  BILITOT 1.5* 1.1  PROT 5.8* 6.0*  ALBUMIN 3.0* 3.0*   No results for input(s): LIPASE, AMYLASE in the last 168 hours. No results for input(s): AMMONIA in the last 168 hours. Coagulation profile No results for input(s): INR, PROTIME in the last 168 hours.  CBC:  Recent Labs Lab 11/25/15 2239 11/26/15 0526  11/28/15 0457 11/29/15 0515 11/30/15 0509 12/01/15 0518 12/02/15 0515  WBC 36.0* 31.0*  < > 15.6* 10.4 9.8 12.6* 12.8*  NEUTROABS 33.1* 28.3*  --   --   --   --    --   --   HGB 11.8* 11.6*  < > 10.9* 11.0* 11.7* 12.0* 12.3*  HCT 34.1* 34.1*  < > 32.7* 33.3* 34.9* 35.8* 37.3*  MCV 81.0 81.8  < > 83.4 82.0 81.5 81.4 82.0  PLT 234 232  < > 253 270 294 313 326  < > = values in this interval not displayed. Cardiac Enzymes: No results for input(s): CKTOTAL, CKMB, CKMBINDEX, TROPONINI in the last 168 hours. BNP: Invalid input(s): POCBNP CBG:  Recent Labs Lab 12/01/15 1219 12/01/15 1630 12/01/15 2147 12/02/15 0720 12/02/15 1145  GLUCAP 121* 111* 116* 125* 130*   D-Dimer No results for input(s): DDIMER in the last 72 hours. Hgb A1c No results for input(s): HGBA1C in the last 72 hours. Lipid Profile No results for input(s): CHOL, HDL, LDLCALC, TRIG, CHOLHDL, LDLDIRECT in the last 72 hours. Thyroid function studies No results for input(s): TSH, T4TOTAL, T3FREE, THYROIDAB in the last 72 hours.  Invalid input(s): FREET3 Anemia work up No results for input(s): VITAMINB12, FOLATE, FERRITIN, TIBC, IRON, RETICCTPCT in the last 72 hours. Microbiology Recent Results (from the past 240 hour(s))  Blood Culture (routine x 2)     Status: None   Collection Time: 11/25/15  8:39 PM  Result Value Ref Range Status   Specimen Description BLOOD RIGHT HAND  Final   Special Requests BOTTLES DRAWN AEROBIC AND ANAEROBIC 5CC  Final   Culture   Final    NO GROWTH 5 DAYS Performed at [facility]    Report Status 11/30/2015 FINAL  Final  Blood Culture (routine x 2)     Status: None   Collection Time: 11/25/15  8:53 PM  Result Value Ref Range Status   Specimen Description BLOOD LEFT HAND  Final   Special Requests BOTTLES DRAWN AEROBIC ONLY 5CC  Final   Culture   Final    NO GROWTH 5 DAYS Performed at [facility]    Report Status 11/30/2015 FINAL  Final  Urine culture     Status: Abnormal   Collection Time: 11/25/15  9:43 PM  Result Value Ref Range Status   Specimen Description URINE, CLEAN CATCH  Final   Special Requests NONE  Final    Culture MULTIPLE SPECIES PRESENT, SUGGEST RECOLLECTION (A)  Final   Report Status 11/27/2015 FINAL  Final  MRSA PCR Screening     Status: None   Collection Time: 11/26/15  2:14 AM  Result Value Ref Range Status   MRSA by PCR NEGATIVE NEGATIVE Final    Comment:        The GeneXpert MRSA Assay (FDA approved for NASAL specimens only), is one component of a comprehensive MRSA colonization surveillance program. It is  not intended to diagnose MRSA infection nor to guide or monitor treatment for MRSA infections.      Discharge Instructions:   Discharge Instructions    Diet - low sodium heart healthy    Complete by:  As directed   Diet Carb Modified    Complete by:  As directed   Discharge instructions    Complete by:  As directed   Home health Keep legs elevated Touch down weight bearing in right leg   Increase activity slowly    Complete by:  As directed       Medication List    STOP taking these medications   clobetasol 0.05 % external solution Commonly known as:  TEMOVATE   lidocaine 2 % solution Commonly known as:  XYLOCAINE   triamcinolone 0.025 % cream Commonly known as:  KENALOG     TAKE these medications   aspirin EC 81 MG tablet Take 81 mg by mouth daily.   carvedilol 6.25 MG tablet Commonly known as:  COREG TAKE 1 TABLET BY MOUTH 2 TIMES DAILY, WITH A MEAL   doxycycline 100 MG tablet Commonly known as:  VIBRA-TABS Take 1 tablet (100 mg total) by mouth every 12 (twelve) hours.   famotidine 40 MG tablet Commonly known as:  PEPCID Take 1 tablet (40 mg total) by mouth 2 (two) times daily.   fluconazole 100 MG tablet Commonly known as:  DIFLUCAN Take 1 tablet (100 mg total) by mouth daily.   furosemide 40 MG tablet Commonly known as:  LASIX TAKE 1 TABLET(40 MG) BY MOUTH DAILY   insulin lispro protamine-lispro (75-25) 100 UNIT/ML Susp injection Commonly known as:  HUMALOG MIX 75/25 110 units in the am and 60 units at night What  changed:  medication strength  how much to take  how to take this  when to take this  additional instructions   linagliptin 5 MG Tabs tablet Commonly known as:  TRADJENTA Take 1 tablet (5 mg total) by mouth daily.   magic mouthwash w/lidocaine Soln Take 10 mLs by mouth 4 (four) times daily as needed for mouth pain.   meloxicam 15 MG tablet Commonly known as:  MOBIC Take 1 tablet (15 mg total) by mouth daily.   metFORMIN 1000 MG tablet Commonly known as:  GLUCOPHAGE Take 1,000 mg by mouth 2 (two) times daily with a meal.   OPANA ER (CRUSH RESISTANT) 20 MG T12a Generic drug:  Oxymorphone HCl (Crush Resist) Take 20 mg by mouth 2 (two) times daily.   oxyCODONE 15 MG immediate release tablet Commonly known as:  ROXICODONE TK 1 T PO FOUR TO FIVE TIMES A DAY as needed for severe pain   potassium chloride SA 20 MEQ tablet Commonly known as:  K-DUR,KLOR-CON TAKE 1 TABLET(20 MEQ) BY MOUTH DAILY   ramipril 10 MG capsule Commonly known as:  ALTACE TAKE ONE CAPSULE BY MOUTH DAILY BEFORE BREAKFAST What changed:  how much to take  how to take this  when to take this  additional instructions   rosuvastatin 20 MG tablet Commonly known as:  CRESTOR Take 1 tablet (20 mg total) by mouth daily before breakfast.   senna-docusate 8.6-50 MG tablet Commonly known as:  Senokot-S Take 1 tablet by mouth 2 (two) times daily. While taking pain meds to prevent constipation What changed:  when to take this  reasons to take this  additional instructions   sertraline 100 MG tablet Commonly known as:  ZOLOFT Take 1.5 tablets (150 mg total) by mouth  daily before breakfast.      Follow-up Information    Binnie Rail, MD Follow up in 1 week(s).   Specialty:  Internal Medicine Why:  for cellulitis follow up Contact information: [place] [address] [phone]        [facility] .   Why:  Home Health RN, Physical Therapy and  aide Contact information: [address] [place] [address] [phone]            Time coordinating discharge: 35 min  Signed:  Lucciano Vitali Alison Stalling   [facility] 12/02/2015, 3:04 PM

## 2015-12-02 NOTE — Progress Notes (Signed)
Patient states his brother will arrive around 2pm to take him home.

## 2015-12-02 NOTE — Care Management Note (Signed)
Case Management Note  Patient Details  Name: Brandon Schaefer MRN: [id number] Date of Birth: February 28, 1956  Subjective/Objective:     Sepsis, right medial malleolus fracture, DM, PVD               Action/Plan: Discharge Planning: AVS reviewed: NCM spoke to pt and offered choice for [facility]. Pt requested [facility] for HH. Contacted [facility] with new referral. Pt states he has RW at home. He still drives to his appts.  PCP - BURNS, Claudina Lick MD  Expected Discharge Date:  12/02/2015             Expected Discharge Plan:  [place]  In-House Referral:  NA  Discharge planning Services  CM Consult  Post Acute Care Choice:  Home Health Choice offered to:  Patient  DME Arranged:  N/A DME Agency:  NA  HH Arranged:  RN, PT, Nurse's Aide [place] Agency:  [place]  Status of Service:  Completed, signed off  If discussed at [place] of Stay Meetings, dates discussed:    Additional Comments:  Erenest Rasher, RN 12/02/2015, 11:28 AM

## 2015-12-06 ENCOUNTER — Ambulatory Visit (INDEPENDENT_AMBULATORY_CARE_PROVIDER_SITE_OTHER): Payer: 59 | Admitting: Internal Medicine

## 2015-12-06 ENCOUNTER — Inpatient Hospital Stay: Payer: 59 | Admitting: Family

## 2015-12-06 ENCOUNTER — Encounter: Payer: Self-pay | Admitting: Internal Medicine

## 2015-12-06 VITALS — BP 140/80 | HR 102 | Temp 98.6°F | Resp 20 | Wt 399.0 lb

## 2015-12-06 DIAGNOSIS — L03119 Cellulitis of unspecified part of limb: Secondary | ICD-10-CM | POA: Diagnosis not present

## 2015-12-06 DIAGNOSIS — E119 Type 2 diabetes mellitus without complications: Secondary | ICD-10-CM | POA: Diagnosis not present

## 2015-12-06 DIAGNOSIS — S82841D Displaced bimalleolar fracture of right lower leg, subsequent encounter for closed fracture with routine healing: Secondary | ICD-10-CM | POA: Diagnosis not present

## 2015-12-06 DIAGNOSIS — I10 Essential (primary) hypertension: Secondary | ICD-10-CM

## 2015-12-06 DIAGNOSIS — Z794 Long term (current) use of insulin: Secondary | ICD-10-CM

## 2015-12-06 DIAGNOSIS — S82891D Other fracture of right lower leg, subsequent encounter for closed fracture with routine healing: Secondary | ICD-10-CM

## 2015-12-06 MED ORDER — SULFAMETHOXAZOLE-TRIMETHOPRIM 800-160 MG PO TABS: 1.0000 | ORAL_TABLET | Freq: Two times a day (BID) | ORAL | 0 refills | Status: DC

## 2015-12-06 NOTE — Progress Notes (Signed)
Subjective:    Patient ID: Brandon Schaefer, male    DOB: 1955-05-21, 60 y.o.   MRN: [id number]  HPI  Here to f/u with sister in law, s/p recent hospn with leg cellulitis, sepsis requiring initial ICU care, IV antibx, all after a fall assoc with a right medial malleolar fracture as well, closed and mild, not requiring surgury or even f/u beyond pain control and PT.  Has hx of DM, HTN and neuropathy,  Pt improved and d/c on aug 19, and good compliance, finished last doxy and diflucan antibx today.  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  Despite this, he and family agree there is incresaed red/swelling/tender/heat to the right dorsal foot, without other trauma, ulcerations abscess or drainage.  No fever, chills or other falls. Was not able to see PCP today (so is seeing me) but is likely to be able to get appt in about 2 wks, and has a scheduled appt with Dr Zenda Alpers where he recalls a May 2017 A1c was about 6.8.   Past Medical History:  Diagnosis Date  . Allergy   . Anemia   . Anxiety   . colon ca dx'd 11/2011  . Diabetes mellitus   . Headache(784.0)   . History of blood transfusion   . Hyperlipidemia   . Hypertension   . Neuromuscular disorder ([place])    peripheral neuropathy  . Shortness of breath    with exertion   Past Surgical History:  Procedure Laterality Date  . COMPLEX WOUND CLOSURE N/A 01/06/2013   Procedure: EXCISION CHRONIC ABDOMINAL WOUND;  Surgeon: Harl Bowie, MD;  Location: [facility];  Service: General;  Laterality: N/A;  . CYSTOSCOPY W/ URETERAL STENT PLACEMENT Bilateral 01/04/2013   Procedure: CYSTOSCOPY WITH RETROGRADE PYELOGRAM/Right double J URETERAL STENT PLACEMENT;  Surgeon: Alexis Frock, MD;  Location: [facility];  Service: Urology;  Laterality: Bilateral;  . CYSTOSCOPY WITH RETROGRADE PYELOGRAM, URETEROSCOPY AND STENT  PLACEMENT Right 01/06/2013   Procedure: CYSTOSCOPY WITH RIGHT RETROGRADE PYELOGRAM, URETEROSCOPY AND BILATERAL STENT EXCHANGE, BILATERAL STONE BASKET EXTRACTION ;  Surgeon: Alexis Frock, MD;  Location: [facility];  Service: Urology;  Laterality: Right;  . FRACTURE SURGERY      ORIF-left radius has pin  . HOLMIUM LASER APPLICATION N/A [id number]   Procedure: HOLMIUM LASER APPLICATION;  Surgeon: Alexis Frock, MD;  Location: [facility];  Service: Urology;  Laterality: N/A;  . NEPHROLITHOTOMY Left 01/04/2013   Procedure: NEPHROLITHOTOMY PERCUTANEOUS  LEFT 1ST STAGE PERCUTANEOUS NEPHROSTOLITHOTOMY;  Surgeon: Alexis Frock, MD;  Location: [facility];  Service: Urology;  Laterality: Left;  . NEPHROLITHOTOMY Left 01/06/2013   Procedure: NEPHROLITHOTOMY PERCUTANEOUS SECOND LOOK;  Surgeon: Alexis Frock, MD;  Location: [facility];  Service: Urology;  Laterality: Left;  . PARTIAL COLECTOMY  01/17/2012   Procedure: PARTIAL COLECTOMY;  Surgeon: Harl Bowie, MD;  Location: [facility];  Service: General;;  . PILONIDAL CYST EXCISION    . PORT-A-CATH REMOVAL Left 01/06/2013   Procedure: REMOVAL PORT-A-CATH;  Surgeon: Harl Bowie, MD;  Location: [facility];  Service: General;  Laterality: Left;  . PORTACATH PLACEMENT  03/19/2012   Procedure: INSERTION PORT-A-CATH;  Surgeon: Harl Bowie, MD;  Location: [place];  Service: General;  Laterality: N/A;  . PROCTOSCOPY  01/17/2012   Procedure: PROCTOSCOPY;  Surgeon: Harl Bowie, MD;  Location: [facility];  Service: General;;    reports that he has never smoked. He has never used smokeless tobacco. He reports that he does not drink alcohol or use drugs. family history includes Cancer in his maternal aunt and maternal grandmother; Diabetes in his father. Allergies  Allergen Reactions  . Adhesive [Tape] Rash   Current Outpatient Prescriptions on File Prior to Visit  Medication Sig Dispense Refill  . aspirin EC 81 MG tablet Take 81 mg by mouth daily.     . carvedilol (COREG) 6.25 MG tablet TAKE 1 TABLET BY MOUTH 2 TIMES DAILY, WITH A MEAL 180 tablet 2  . famotidine (PEPCID) 40 MG tablet Take 1 tablet (40 mg total) by mouth 2 (two) times daily. 60 tablet 0  . furosemide (LASIX) 40 MG tablet TAKE 1 TABLET(40 MG) BY MOUTH DAILY 90 tablet 1  . insulin lispro protamine-lispro (HUMALOG MIX 75/25) (75-25) 100 UNIT/ML SUSP injection 110 units in the am and 60 units at night 10 mL 11  . linagliptin (TRADJENTA) 5 MG TABS tablet Take 1 tablet (5 mg total) by mouth daily. 90 tablet 1  . magic mouthwash w/lidocaine SOLN Take 10 mLs by mouth 4 (four) times daily as needed for mouth pain. 100 mL 0  . meloxicam (MOBIC) 15 MG tablet Take 1 tablet (15 mg total) by mouth daily. 90 tablet 1  . metFORMIN (GLUCOPHAGE) 1000 MG tablet Take 1,000 mg by mouth 2 (two) times daily with a meal.     . O[place]NA ER, CRUSH RESISTANT, 20 MG T12A Take 20 mg by mouth 2 (two) times daily.    [name] oxyCODONE (ROXICODONE) 15 MG immediate release tablet TK 1 T PO FOUR TO FIVE TIMES A DAY as needed for severe pain  0  . potassium chloride SA (K-DUR,KLOR-CON) 20 MEQ tablet TAKE 1 TABLET(20 MEQ) BY MOUTH DAILY 90 tablet 1  . ramipril (ALTACE) 10 MG capsule TAKE ONE CAPSULE BY MOUTH DAILY BEFORE BREAKFAST (Patient taking differently: Take 10 mg by mouth daily before breakfast. ) 90 capsule 2  . rosuvastatin (CRESTOR) 20 MG tablet Take 1 tablet (20 mg total) by mouth daily before breakfast. 90 tablet 1  . senna-docusate (SENOKOT-S) 8.6-50 MG per tablet Take 1 tablet by mouth 2 (two) times daily. While taking pain meds to prevent constipation (Patient taking differently: Take 1 tablet by mouth 2 (two) times daily as needed for moderate constipation. While taking pain meds to prevent constipation) 30 tablet 0  . sertraline (ZOLOFT) 100 MG tablet Take 1.5 tablets (150 mg total) by mouth daily before breakfast. 135 tablet 1   No current facility-administered medications on file prior to visit.      Review of Systems  Constitutional: Negative for unusual diaphoresis or night sweats HENT: Negative for ear swelling or discharge Eyes: Negative for worsening visual haziness  Respiratory: Negative for choking and stridor.   Gastrointestinal: Negative for distension or worsening eructation Genitourinary: Negative for retention or change in urine volume.  Musculoskeletal: Negative for other MSK pain or swelling Skin: Negative for color change and worsening wound Neurological: Negative for tremors and numbness other than noted  Psychiatric/Behavioral: Negative for decreased concentration or agitation other than above       Objective:   Physical Exam BP 140/80   Pulse (!) 102   Temp 98.6 F (37 C) (Oral)   Resp 20   Wt (!) 399 lb (181 kg)   SpO2 90%   BMI 54.11 kg/m  VS noted, morbid obese  Constitutional: Pt appears in no apparent distress, pleasant, nontoxic HENT: Head: NCAT.  Right Ear: External ear normal.  Left Ear: External ear normal.  Eyes: . Pupils are equal, round, and reactive to light. Conjunctivae and EOM are normal Neck: Normal range of motion. Neck supple.  Cardiovascular: Normal rate and regular rhythm.   Pulmonary/Chest: Effort normal and breath sounds without rales or wheezing.  Neurological: Pt is alert. Not confused , motor grossly intact, decreased sens to LT to toes Skin: right pretibial leg with mild nontender nonswollen brown/red recent infecious related skin, right medial malleoar area ankle is mild swelling without overt bruising or ulcer Right dorsal foot however with 2+ heat/swelling/tender/erythema approx 4x4 cm area, without ulcer or fluctuance or drainage Psychiatric: Pt behavior is normal. No agitation.  Lab Results  Component Value Date   WBC 12.8 (H) 12/02/2015   HGB 12.3 (L) 12/02/2015   HCT 37.3 (L) 12/02/2015   PLT 326 12/02/2015   GLUCOSE 109 (H) 12/02/2015   ALT 32 11/26/2015   AST 86 (H) 11/26/2015   NA 139 12/02/2015   K 3.9  12/02/2015   CL 102 12/02/2015   CREATININE 1.02 12/02/2015   BUN 18 12/02/2015   CO2 30 12/02/2015       Assessment & Plan:

## 2015-12-06 NOTE — Progress Notes (Signed)
Pre visit review using our clinic review tool, if applicable. No additional management support is needed unless otherwise documented below in the visit note. 

## 2015-12-06 NOTE — Progress Notes (Signed)
Letter done

## 2015-12-06 NOTE — Assessment & Plan Note (Signed)
stable overall by history and exam, recent data reviewed with pt, and pt to continue medical treatment as before,  to f/u any worsening symptoms or concerns No results found for: HGBA1C Pt with reported a1c approx 8, cont to follow with Dr Carlis Abbott

## 2015-12-06 NOTE — Patient Instructions (Addendum)
Please take all new medication as prescribed - the antibiotic  Please continue all other medications as before, and refills have been done if requested.  Please have the pharmacy call with any other refills you may need.  Please keep your appointments with your specialists as you may have planned - Dr Quay Burow and Dr Carlis Abbott in early September as you have planned, as well as the [place] are given the work note

## 2015-12-06 NOTE — Assessment & Plan Note (Signed)
Pt with mult comorbids finishing doxy now with worsening right dorsal foot s/s cellulitis again - for oral septra ds bid, cont to follow closely, f/u with PCP in 2 wks, or sooner if needed  Note:  Total time for pt hx, exam, review of record with pt in the room, determination of diagnoses and plan for further eval and tx is > 40 min, with over 50% spent in coordination and counseling of patient

## 2015-12-06 NOTE — Assessment & Plan Note (Signed)
Cont PT, not felt to require further ortho f/u at this time

## 2015-12-06 NOTE — Assessment & Plan Note (Signed)
stable overall by history and exam, recent data reviewed with pt, and pt to continue medical treatment as before,  to f/u any worsening symptoms or concerns BP Readings from Last 3 Encounters:  12/06/15 140/80  12/02/15 122/60  08/22/15 (!) 144/78   \

## 2015-12-11 ENCOUNTER — Ambulatory Visit: Payer: 59 | Admitting: Family

## 2015-12-22 ENCOUNTER — Ambulatory Visit: Payer: 59 | Admitting: Family

## 2015-12-22 ENCOUNTER — Ambulatory Visit (INDEPENDENT_AMBULATORY_CARE_PROVIDER_SITE_OTHER): Payer: 59 | Admitting: Internal Medicine

## 2015-12-22 ENCOUNTER — Other Ambulatory Visit (INDEPENDENT_AMBULATORY_CARE_PROVIDER_SITE_OTHER): Payer: 59

## 2015-12-22 ENCOUNTER — Other Ambulatory Visit: Payer: 59

## 2015-12-22 VITALS — BP 142/76 | HR 90 | Temp 98.0°F | Resp 18 | Wt 381.0 lb

## 2015-12-22 DIAGNOSIS — E119 Type 2 diabetes mellitus without complications: Secondary | ICD-10-CM

## 2015-12-22 DIAGNOSIS — I10 Essential (primary) hypertension: Secondary | ICD-10-CM

## 2015-12-22 DIAGNOSIS — E785 Hyperlipidemia, unspecified: Secondary | ICD-10-CM | POA: Diagnosis not present

## 2015-12-22 DIAGNOSIS — Z794 Long term (current) use of insulin: Secondary | ICD-10-CM | POA: Diagnosis not present

## 2015-12-22 DIAGNOSIS — S82891D Other fracture of right lower leg, subsequent encounter for closed fracture with routine healing: Secondary | ICD-10-CM

## 2015-12-22 DIAGNOSIS — A419 Sepsis, unspecified organism: Secondary | ICD-10-CM

## 2015-12-22 DIAGNOSIS — R600 Localized edema: Secondary | ICD-10-CM

## 2015-12-22 DIAGNOSIS — L0390 Cellulitis, unspecified: Secondary | ICD-10-CM | POA: Diagnosis not present

## 2015-12-22 DIAGNOSIS — Z1159 Encounter for screening for other viral diseases: Secondary | ICD-10-CM

## 2015-12-22 DIAGNOSIS — Z23 Encounter for immunization: Secondary | ICD-10-CM

## 2015-12-22 DIAGNOSIS — S82841D Displaced bimalleolar fracture of right lower leg, subsequent encounter for closed fracture with routine healing: Secondary | ICD-10-CM

## 2015-12-22 DIAGNOSIS — Z6841 Body Mass Index (BMI) 40.0 and over, adult: Secondary | ICD-10-CM

## 2015-12-22 LAB — COMPREHENSIVE METABOLIC PANEL
ALT: 21 U/L (ref 0–53)
AST: 18 U/L (ref 0–37)
Albumin: 3.9 g/dL (ref 3.5–5.2)
Alkaline Phosphatase: 80 U/L (ref 39–117)
BILIRUBIN TOTAL: 0.5 mg/dL (ref 0.2–1.2)
BUN: 18 mg/dL (ref 6–23)
CALCIUM: 9.2 mg/dL (ref 8.4–10.5)
CO2: 27 meq/L (ref 19–32)
CREATININE: 0.96 mg/dL (ref 0.40–1.50)
Chloride: 103 mEq/L (ref 96–112)
GFR: 84.39 mL/min (ref >60.00)
GLUCOSE: 144 mg/dL — AB (ref 70–99)
Potassium: 4.8 mEq/L (ref 3.5–5.1)
Sodium: 136 mEq/L (ref 135–145)
Total Protein: 6.9 g/dL (ref 6.0–8.3)

## 2015-12-22 LAB — CBC WITH DIFFERENTIAL/PLATELET
BASOS ABS: 0 10*3/uL (ref 0.0–0.1)
Basophils Relative: 0.4 % (ref 0.0–3.0)
EOS PCT: 4.6 % (ref 0.0–5.0)
Eosinophils Absolute: 0.4 10*3/uL (ref 0.0–0.7)
HEMATOCRIT: 37.3 % — AB (ref 39.0–52.0)
HEMOGLOBIN: 12.8 g/dL — AB (ref 13.0–17.0)
LYMPHS ABS: 1.4 10*3/uL (ref 0.7–4.0)
LYMPHS PCT: 17.1 % (ref 12.0–46.0)
MCHC: 34.3 g/dL (ref 30.0–36.0)
MCV: 81 fl (ref 78.0–100.0)
MONOS PCT: 7.3 % (ref 3.0–12.0)
Monocytes Absolute: 0.6 10*3/uL (ref 0.1–1.0)
NEUTROS PCT: 70.6 % (ref 43.0–77.0)
Neutro Abs: 5.8 10*3/uL (ref 1.4–7.7)
Platelets: 295 10*3/uL (ref 150.0–400.0)
RBC: 4.61 Mil/uL (ref 4.22–5.81)
RDW: 14.3 % (ref 11.5–15.5)
WBC: 8.3 10*3/uL (ref 4.0–10.5)

## 2015-12-22 LAB — LIPID PANEL
CHOL/HDL RATIO: 3
CHOLESTEROL: 79 mg/dL (ref 0–200)
HDL: 28.2 mg/dL — ABNORMAL LOW (ref >39.00)
LDL CALC: 19 mg/dL (ref 0–99)
NonHDL: 50.7
TRIGLYCERIDES: 159 mg/dL — AB (ref 0.0–149.0)
VLDL: 31.8 mg/dL (ref 0.0–40.0)

## 2015-12-22 LAB — HEMOGLOBIN A1C: Hgb A1c MFr Bld: 6.9 % — ABNORMAL HIGH (ref 4.6–6.5)

## 2015-12-22 LAB — MICROALBUMIN / CREATININE URINE RATIO
Creatinine,U: 157.4 mg/dL
MICROALB/CREAT RATIO: 0.4 mg/g (ref 0.0–30.0)
Microalb, Ur: <0.7 mg/dL (ref 0.0–1.9)

## 2015-12-22 LAB — TSH: TSH: 1.95 u[IU]/mL (ref 0.35–4.50)

## 2015-12-22 MED ORDER — INSULIN LISPRO PROT & LISPRO (75-25 MIX) 100 UNIT/ML ~~LOC~~ SUSP: SUBCUTANEOUS | 11 refills | Status: DC

## 2015-12-22 NOTE — Progress Notes (Signed)
Subjective:    Patient ID: Brandon Schaefer, male    DOB: [birth year], 60 y.o.   MRN: [id number]  HPI The patient is here for follow up.  Diabetes: He follows with endocrine - Dr Carlis Abbott.  He is taking his medication daily as prescribed. He is compliant with a diabetic diet - this is new since being in the hospital. He is exercising regularly with PT and increasing his walking in his house.   He has lost weight. He monitors his sugars and they have been running 50's in the afternoon, 120-130 in the morning.   Hypertension: He is taking his medication daily. He is compliant with a low sodium diet.  He denies chest pain, palpitations and regular headaches. He is exercising with PT.  He does monitor his blood pressure at home, 120/50 has been well controlled.    Hyperlipidemia: He is taking his medication daily. He is compliant with a low fat/cholesterol diet. He was exercising with PT and trying to increase his walking in his home. He has lost weight.  Cellulitis:  He was hospitalized last month for leg cellulitis and sepsis requiring ICU care.  This was the result of a fall and right medial malleolar fracture that did not require surgery.  He is doing PT.  He was seen by Dr Jenny Reichmann the end of the month, about two weeks ago, and he still has some swelling and redness in the leg that was getting worse.  He was placed on Bactrim.  He still has some redness and swelling in the right lower leg, but is close to his baseline - maybe slightly worse.  He has some pain with standing or after walking from the ankle fracture.  He just completed PT and they advised he can have more if needed.  He thinks he is doing ok.  He is trying to walk more in his house.  Medications and allergies reviewed with patient and updated if appropriate.  Patient Active Problem List   Diagnosis Date Noted  . Cellulitis of foot 12/06/2015  . Pressure ulcer 11/27/2015  . Sepsis due to cellulitis ([place]) 11/26/2015  . Lactic acidosis  11/26/2015  . Right medial malleolar fracture 11/26/2015  . Anxiety 11/26/2015  . Hyperlipidemia 11/26/2015  . Bilateral leg edema 08/22/2015  . Dental abscess 08/03/2015  . Back pain 03/20/2015  . Anemia, iron deficiency 05/05/2012  . DM (diabetes mellitus) ([place]) 01/18/2012  . HTN (hypertension) 01/18/2012  . Morbid obesity with body mass index of 60.0-69.9 in adult ([place]) 01/17/2012  . Respiratory failure, post-operative ([place]) 01/17/2012  . Cancer of sigmoid colon ([place]) 12/23/2011  . Arthritis of knee, degenerative 08/09/2011  . Peripheral nerve disease ([place]) 01/01/2006    Current Outpatient Prescriptions on File Prior to Visit  Medication Sig Dispense Refill  . aspirin EC 81 MG tablet Take 81 mg by mouth daily.    . carvedilol (COREG) 6.25 MG tablet TAKE 1 TABLET BY MOUTH 2 TIMES DAILY, WITH A MEAL 180 tablet 2  . famotidine (PEPCID) 40 MG tablet Take 1 tablet (40 mg total) by mouth 2 (two) times daily. 60 tablet 0  . furosemide (LASIX) 40 MG tablet TAKE 1 TABLET(40 MG) BY MOUTH DAILY 90 tablet 1  . insulin lispro protamine-lispro (HUMALOG MIX 75/25) (75-25) 100 UNIT/ML SUSP injection 110 units in the am and 60 units at night 10 mL 11  . linagliptin (TRADJENTA) 5 MG TABS tablet Take 1 tablet (5 mg total) by mouth daily. 90 tablet  1  . magic mouthwash w/lidocaine SOLN Take 10 mLs by mouth 4 (four) times daily as needed for mouth pain. 100 mL 0  . meloxicam (MOBIC) 15 MG tablet Take 1 tablet (15 mg total) by mouth daily. 90 tablet 1  . metFORMIN (GLUCOPHAGE) 1000 MG tablet Take 1,000 mg by mouth 2 (two) times daily with a meal.     . OPANA ER, CRUSH RESISTANT, 20 MG T12A Take 20 mg by mouth 2 (two) times daily.    [name] oxyCODONE (ROXICODONE) 15 MG immediate release tablet TK 1 T PO FOUR TO FIVE TIMES A DAY as needed for severe pain  0  . potassium chloride SA (K-DUR,KLOR-CON) 20 MEQ tablet TAKE 1 TABLET(20 MEQ) BY MOUTH DAILY 90 tablet 1  . ramipril (ALTACE) 10 MG capsule TAKE ONE CAPSULE  BY MOUTH DAILY BEFORE BREAKFAST (Patient taking differently: Take 10 mg by mouth daily before breakfast. ) 90 capsule 2  . rosuvastatin (CRESTOR) 20 MG tablet Take 1 tablet (20 mg total) by mouth daily before breakfast. 90 tablet 1  . senna-docusate (SENOKOT-S) 8.6-50 MG per tablet Take 1 tablet by mouth 2 (two) times daily. While taking pain meds to prevent constipation (Patient taking differently: Take 1 tablet by mouth 2 (two) times daily as needed for moderate constipation. While taking pain meds to prevent constipation) 30 tablet 0  . sertraline (ZOLOFT) 100 MG tablet Take 1.5 tablets (150 mg total) by mouth daily before breakfast. 135 tablet 1   No current facility-administered medications on file prior to visit.     Past Medical History:  Diagnosis Date  . Allergy   . Anemia   . Anxiety   . colon ca dx'd 11/2011  . Diabetes mellitus   . Headache(784.0)   . History of blood transfusion   . Hyperlipidemia   . Hypertension   . Neuromuscular disorder ([place])    peripheral neuropathy  . Shortness of breath    with exertion    Past Surgical History:  Procedure Laterality Date  . COMPLEX WOUND CLOSURE N/A 01/06/2013   Procedure: EXCISION CHRONIC ABDOMINAL WOUND;  Surgeon: Harl Bowie, MD;  Location: [facility];  Service: General;  Laterality: N/A;  . CYSTOSCOPY W/ URETERAL STENT PLACEMENT Bilateral 01/04/2013   Procedure: CYSTOSCOPY WITH RETROGRADE PYELOGRAM/Right double J URETERAL STENT PLACEMENT;  Surgeon: Alexis Frock, MD;  Location: [facility];  Service: Urology;  Laterality: Bilateral;  . CYSTOSCOPY WITH RETROGRADE PYELOGRAM, URETEROSCOPY AND STENT PLACEMENT Right 01/06/2013   Procedure: CYSTOSCOPY WITH RIGHT RETROGRADE PYELOGRAM, URETEROSCOPY AND BILATERAL STENT EXCHANGE, BILATERAL STONE BASKET EXTRACTION ;  Surgeon: Alexis Frock, MD;  Location: [facility];  Service: Urology;  Laterality: Right;  . FRACTURE SURGERY      ORIF-left radius has pin  . HOLMIUM LASER APPLICATION N/A  [id number]   Procedure: HOLMIUM LASER APPLICATION;  Surgeon: Alexis Frock, MD;  Location: [facility];  Service: Urology;  Laterality: N/A;  . NEPHROLITHOTOMY Left 01/04/2013   Procedure: NEPHROLITHOTOMY PERCUTANEOUS  LEFT 1ST STAGE PERCUTANEOUS NEPHROSTOLITHOTOMY;  Surgeon: Alexis Frock, MD;  Location: [facility];  Service: Urology;  Laterality: Left;  . NEPHROLITHOTOMY Left 01/06/2013   Procedure: NEPHROLITHOTOMY PERCUTANEOUS SECOND LOOK;  Surgeon: Alexis Frock, MD;  Location: [facility];  Service: Urology;  Laterality: Left;  . PARTIAL COLECTOMY  01/17/2012   Procedure: PARTIAL COLECTOMY;  Surgeon: Harl Bowie, MD;  Location: [facility];  Service: General;;  . PILONIDAL CYST EXCISION    . PORT-A-CATH REMOVAL Left 01/06/2013   Procedure: REMOVAL PORT-A-CATH;  Surgeon: Harl Bowie, MD;  Location: [facility];  Service: General;  Laterality: Left;  . PORTACATH PLACEMENT  03/19/2012   Procedure: INSERTION PORT-A-CATH;  Surgeon: Harl Bowie, MD;  Location: [facility];  Service: General;  Laterality: N/A;  . PROCTOSCOPY  01/17/2012   Procedure: PROCTOSCOPY;  Surgeon: Harl Bowie, MD;  Location: [facility];  Service: General;;    Social History   Social History  . Marital status: Single    Spouse name: N/A  . Number of children: N/A  . Years of education: N/A   Social History Main Topics  . Smoking status: Never Smoker  . Smokeless tobacco: Never Used     Comment: NEVER USED TOBACC0  . Alcohol use No  . Drug use: No  . Sexual activity: Not on file   Other Topics Concern  . Not on file   Social History Narrative  . No narrative on file    Family History  Problem Relation Age of Onset  . Diabetes Father   . Cancer Maternal Aunt     colon  . Cancer Maternal Grandmother     colon    Review of Systems  Constitutional: Positive for fatigue (improved since hospital). Negative for appetite change and fever.  Respiratory: Positive for shortness of breath (with  walking a lot). Negative for cough and wheezing.   Cardiovascular: Positive for leg swelling. Negative for chest pain and palpitations.  Neurological: Positive for dizziness and light-headedness. Negative for headaches.       Objective:   Vitals:   12/22/15 1344  BP: (!) 142/76  Pulse: 90  Resp: 18  Temp: 98 F (36.7 C)   Filed Weights   12/22/15 1344  Weight: (!) 381 lb (172.8 kg)   Body mass index is 51.67 kg/m.   Physical Exam    Constitutional: Appears well-developed and well-nourished. No distress.  HENT:  Head: Normocephalic and atraumatic.  Neck: Neck supple. No tracheal deviation present. No thyromegaly present.  Cardiovascular: Normal rate, regular rhythm and normal heart sounds.   No murmur heard. No carotid bruit  Pulmonary/Chest: Effort normal and breath sounds normal. No respiratory distress. No has no wheezes. No rales.  Musculoskeletal/vascular/skin: 1+ edema b/l with chronic erythema - no open ulcers or skin breaks, mild warmth and tenderness - felt to be chronic.  Lymphadenopathy: No cervical adenopathy.  Skin: Skin is warm and dry. Not diaphoretic.  Psychiatric: depressed mood and affect. Behavior is normal.     Assessment & Plan:    See Problem List for Assessment and Plan of chronic medical problems.

## 2015-12-22 NOTE — Progress Notes (Signed)
Pre visit review using our clinic review tool, if applicable. No additional management support is needed unless otherwise documented below in the visit note. 

## 2015-12-22 NOTE — Patient Instructions (Addendum)
  Test(s) ordered today. Your results will be released to [place] (or called to you) after review, usually within 72hours after test completion. If any changes need to be made, you will be notified at that same time.  All other Health Maintenance issues reviewed.   All recommended immunizations and age-appropriate screenings are up-to-date or discussed.  Flu vaccine administered today.   Medications reviewed and updated.  Changes include decreasing the morning insulin to 100 units.  Follow up with Dr Carlis Abbott.  Please followup in 6 months, sooner if needed

## 2015-12-23 ENCOUNTER — Encounter: Payer: Self-pay | Admitting: Internal Medicine

## 2015-12-23 LAB — HEPATITIS C ANTIBODY: HCV AB: NEGATIVE

## 2015-12-23 NOTE — Assessment & Plan Note (Signed)
Low sugars in afternoon - insulin was increased while he was in the hospital and he has lost weight Decreased humalog to 100 units in morning, continue 60 units at night He will has a follow up with Dr Carlis Abbott in a couple of week, but will monitor sugars over the weekend and call him on Monday to discuss if further changes are needed Continue other medications

## 2015-12-23 NOTE — Assessment & Plan Note (Signed)
No evidence of cellulitis today - he does have chronic swelling and erythema in both legs He will monitor closely and return with any increased redness or warmth in his legs

## 2015-12-23 NOTE — Assessment & Plan Note (Signed)
He has changed in diet and has increased his activity - he has lost weight Encouraged him to continue his efforts  Wt Readings from Last 3 Encounters:  12/22/15 (!) 381 lb (172.8 kg)  12/06/15 (!) 399 lb (181 kg)  11/26/15 (!) 413 lb (187.3 kg)

## 2015-12-23 NOTE — Assessment & Plan Note (Signed)
Edema improved with weight loss and increased walking - stressed him to continue his efforts Continue lasix 40 mg daily and [place] con cmp today

## 2015-12-23 NOTE — Assessment & Plan Note (Signed)
Still with some pain Completed PT - does not feel he needs additional PT, but will let me know if he dose Using walker

## 2015-12-23 NOTE — Assessment & Plan Note (Signed)
Continue crestor 20 mg 

## 2015-12-23 NOTE — Assessment & Plan Note (Signed)
Elevated here but much lower at home Continue current medications cmp today

## 2016-01-07 ENCOUNTER — Other Ambulatory Visit: Payer: Self-pay | Admitting: Internal Medicine

## 2016-01-09 ENCOUNTER — Other Ambulatory Visit (HOSPITAL_BASED_OUTPATIENT_CLINIC_OR_DEPARTMENT_OTHER): Payer: 59

## 2016-01-09 ENCOUNTER — Ambulatory Visit (HOSPITAL_BASED_OUTPATIENT_CLINIC_OR_DEPARTMENT_OTHER): Payer: 59 | Admitting: Family

## 2016-01-09 VITALS — BP 138/51 | HR 81 | Temp 98.3°F | Resp 22 | Wt 378.0 lb

## 2016-01-09 DIAGNOSIS — Z85038 Personal history of other malignant neoplasm of large intestine: Secondary | ICD-10-CM

## 2016-01-09 DIAGNOSIS — R600 Localized edema: Secondary | ICD-10-CM

## 2016-01-09 DIAGNOSIS — C187 Malignant neoplasm of sigmoid colon: Secondary | ICD-10-CM

## 2016-01-09 DIAGNOSIS — D509 Iron deficiency anemia, unspecified: Secondary | ICD-10-CM

## 2016-01-09 LAB — CBC WITH DIFFERENTIAL (CANCER CENTER ONLY)
BASO#: 0 10*3/uL (ref 0.0–0.2)
BASO%: 0.4 % (ref 0.0–2.0)
EOS ABS: 0.2 10*3/uL (ref 0.0–0.5)
EOS%: 2.1 % (ref 0.0–7.0)
HEMATOCRIT: 40.7 % (ref 38.7–49.9)
HEMOGLOBIN: 14 g/dL (ref 13.0–17.1)
LYMPH#: 1.4 10*3/uL (ref 0.9–3.3)
LYMPH%: 12.7 % — ABNORMAL LOW (ref 14.0–48.0)
MCH: 27.8 pg — AB (ref 28.0–33.4)
MCHC: 34.4 g/dL (ref 32.0–35.9)
MCV: 81 fL — ABNORMAL LOW (ref 82–98)
MONO#: 0.7 10*3/uL (ref 0.1–0.9)
MONO%: 6.5 % (ref 0.0–13.0)
NEUT%: 78.3 % (ref 40.0–80.0)
NEUTROS ABS: 8.7 10*3/uL — AB (ref 1.5–6.5)
Platelets: 313 10*3/uL (ref 145–400)
RBC: 5.03 10*6/uL (ref 4.20–5.70)
RDW: 13.7 % (ref 11.1–15.7)
WBC: 11.2 10*3/uL — AB (ref 4.0–10.0)

## 2016-01-09 LAB — COMPREHENSIVE METABOLIC PANEL
ALBUMIN: 3.8 g/dL (ref 3.5–5.0)
ALK PHOS: 86 U/L (ref 40–150)
ALT: 18 U/L (ref 0–55)
AST: 18 U/L (ref 5–34)
Anion Gap: 11 mEq/L (ref 3–11)
BILIRUBIN TOTAL: 0.57 mg/dL (ref 0.20–1.20)
BUN: 20.5 mg/dL (ref 7.0–26.0)
CALCIUM: 10 mg/dL (ref 8.4–10.4)
CO2: 23 mEq/L (ref 22–29)
Chloride: 106 mEq/L (ref 98–109)
Creatinine: 1 mg/dL (ref 0.7–1.3)
EGFR: 78 mL/min/{1.73_m2} — ABNORMAL LOW (ref >90)
GLUCOSE: 112 mg/dL (ref 70–140)
POTASSIUM: 4.3 meq/L (ref 3.5–5.1)
Sodium: 140 mEq/L (ref 136–145)
TOTAL PROTEIN: 7.6 g/dL (ref 6.4–8.3)

## 2016-01-09 LAB — CEA (IN HOUSE-CHCC): CEA (CHCC-IN HOUSE): 1.69 ng/mL (ref 0.00–5.00)

## 2016-01-09 NOTE — Progress Notes (Signed)
Hematology and Oncology Follow Up Visit  Kipper Buch [id number] 1955/08/30 60 y.o. 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 Peine is here today for a follow-up. He was hospitalized in August with sepsis due to cellulitis or the legs. He has recuperated nicely and has completed PT. He is able to ambulate with his walker and is back to work.  He had a fall the day he was admitted for sepsis but has not had any other falls or syncopal episodes since.  No fever, chills, n/v, cough, rash, dizziness, SOB, chest pain, palpitations, abdominal pain or changes in bowel or bladder habits.  His CEA at this time is 1.6.  No lymphadenopathy found on exam. No episodes of bleeding or bruising.  He has +1 pitting edema in both lower extremities. This has improved with his being on lasix. His pedal pulses are +2. The neuropathy in his hands and feet is unchanged.  He has maintained a good appetite and is staying well hydrated. His weight is stable.  His blood sugars have been better controlled. He is 112 today.   Medications:    Medication List       Accurate as of 01/09/16 10:14 AM. Always use your most recent med list.          aspirin EC 81 MG tablet Take 81 mg by mouth daily.   carvedilol 6.25 MG tablet Commonly known as:  COREG TAKE 1 TABLET BY MOUTH 2 TIMES DAILY, WITH A MEAL   famotidine 40 MG tablet Commonly known as:  PEPCID Take 1 tablet (40 mg total) by mouth 2 (two) times daily.   furosemide 40 MG tablet Commonly known as:  LASIX TAKE 1 TABLET(40 MG) BY MOUTH DAILY   insulin lispro protamine-lispro (75-25) 100 UNIT/ML Susp injection Commonly known as:  HUMALOG MIX 75/25 100 units in the am and 60 units at night   linagliptin 5 MG Tabs tablet Commonly known as:  TRADJENTA Take 1 tablet (5 mg total) by  mouth daily.   magic mouthwash w/lidocaine Soln Take 10 mLs by mouth 4 (four) times daily as needed for mouth pain.   meloxicam 15 MG tablet Commonly known as:  MOBIC Take 1 tablet (15 mg total) by mouth daily.   metFORMIN 1000 MG tablet Commonly known as:  GLUCOPHAGE Take 1,000 mg by mouth 2 (two) times daily with a meal.   OPANA ER (CRUSH RESISTANT) 20 MG T12a Generic drug:  Oxymorphone HCl (Crush Resist) Take 20 mg by mouth 2 (two) times daily.   oxyCODONE 15 MG immediate release tablet Commonly known as:  ROXICODONE TK 1 T PO FOUR TO FIVE TIMES A DAY as needed for severe pain   potassium chloride SA 20 MEQ tablet Commonly known as:  K-DUR,KLOR-CON TAKE 1 TABLET(20 MEQ) BY MOUTH DAILY   ramipril 10 MG capsule Commonly known as:  ALTACE TAKE ONE CAPSULE BY MOUTH DAILY BEFORE BREAKFAST   rosuvastatin 20 MG tablet Commonly known as:  CRESTOR Take 1 tablet (20 mg total) by mouth daily before breakfast.   rosuvastatin 20 MG tablet Commonly known as:  CRESTOR TAKE 1 TABLET BY MOUTH DAILY BEFORE BREAKFAST   senna-docusate 8.6-50 MG tablet Commonly known as:  Senokot-S Take 1 tablet by mouth 2 (two) times daily. While taking pain meds to prevent constipation   sertraline 100  MG tablet Commonly known as:  ZOLOFT Take 1.5 tablets (150 mg total) by mouth daily before breakfast.       Allergies:  Allergies  Allergen Reactions  . Adhesive [Tape] Rash    Past Medical History, Surgical history, Social history, and Family History were reviewed and updated.  Review of Systems: All other 10 point review of systems is negative.   Physical Exam:  vitals were not taken for this visit.  Wt Readings from Last 3 Encounters:  12/22/15 (!) 381 lb (172.8 kg)  12/06/15 (!) 399 lb (181 kg)  11/26/15 (!) 413 lb (187.3 kg)    Ocular: Sclerae unicteric, pupils equal, round and reactive to light Ear-nose-throat: Oropharynx clear, dentition fair Lymphatic: No cervical  supraclavicular or axillary adenopathy Lungs no rales or rhonchi, good excursion bilaterally Heart regular rate and rhythm, no murmur appreciated Abd soft, nontender, positive bowel sounds, no liver or spleen tip palpated on exam  MSK no focal spinal tenderness, no joint edema, chronic +1 pitting edema of both lower extremities.  Neuro: non-focal, well-oriented, appropriate affect Breasts: Deferred   Lab Results  Component Value Date   WBC 11.2 (H) 01/09/2016   HGB 14.0 01/09/2016   HCT 40.7 01/09/2016   MCV 81 (L) 01/09/2016   PLT 313 01/09/2016   Lab Results  Component Value Date   FERRITIN 85 06/16/2014   IRON 58 06/16/2014   TIBC 336 06/16/2014   UIBC 279 06/16/2014   IRONPCTSAT 17 (L) 06/16/2014   Lab Results  Component Value Date   RBC 5.03 01/09/2016   No results found for: KPAFRELGTCHN, LAMBDASER, KAPLAMBRATIO No results found for: Kandis Cocking, IGMSERUM No results found for: Odetta Pink, SPEI   Chemistry      Component Value Date/Time   NA 136 12/22/2015 1416   NA 142 06/21/2015 0828   K 4.8 12/22/2015 1416   K 4.2 06/21/2015 0828   CL 103 12/22/2015 1416   CL 98 12/22/2014 0751   CO2 27 12/22/2015 1416   CO2 25 06/21/2015 0828   BUN 18 12/22/2015 1416   BUN 11.7 06/21/2015 0828   CREATININE 0.96 12/22/2015 1416   CREATININE 0.9 06/21/2015 0828      Component Value Date/Time   CALCIUM 9.2 12/22/2015 1416   CALCIUM 9.0 06/21/2015 0828   ALKPHOS 80 12/22/2015 1416   ALKPHOS 87 06/21/2015 0828   AST 18 12/22/2015 1416   AST 14 06/21/2015 0828   ALT 21 12/22/2015 1416   ALT 17 06/21/2015 0828   BILITOT 0.5 12/22/2015 1416   BILITOT 0.42 06/21/2015 0828     Impression and Plan: Mr. Crowson is 60 yo gentleman with a history of a stage II colon cancer. He completed chemotherapy in June 2014. He was felt to be at high risk for recurrence due to microsatellite stability. So far, he has done well and  there has been no evidence of recurrence. He has recuperated from a bout with sepsis nicely and is now back at work. He has no s/s of infection at this time.  His CEA today was 1.69. CBC and CMP are stable.  We will plan to see him back in 6 months for lab work and follow-up.  He will contact our office with any questions or concerns. We can certainly see him sooner if need be.   Eliezer Bottom, NP 9/26/201710:14 AM

## 2016-01-10 LAB — CEA: CEA: 1.7 ng/mL (ref 0.0–4.7)

## 2016-03-03 ENCOUNTER — Other Ambulatory Visit: Payer: Self-pay | Admitting: Internal Medicine

## 2016-03-18 ENCOUNTER — Other Ambulatory Visit: Payer: Self-pay | Admitting: Internal Medicine

## 2016-03-31 ENCOUNTER — Other Ambulatory Visit: Payer: Self-pay | Admitting: Internal Medicine

## 2016-04-28 ENCOUNTER — Other Ambulatory Visit: Payer: Self-pay | Admitting: Internal Medicine

## 2016-05-26 ENCOUNTER — Other Ambulatory Visit: Payer: Self-pay | Admitting: Internal Medicine

## 2016-06-23 ENCOUNTER — Other Ambulatory Visit: Payer: Self-pay | Admitting: Internal Medicine

## 2016-07-09 ENCOUNTER — Other Ambulatory Visit (HOSPITAL_BASED_OUTPATIENT_CLINIC_OR_DEPARTMENT_OTHER): Payer: 59

## 2016-07-09 ENCOUNTER — Ambulatory Visit (HOSPITAL_BASED_OUTPATIENT_CLINIC_OR_DEPARTMENT_OTHER): Payer: 59 | Admitting: Family

## 2016-07-09 VITALS — BP 125/48 | HR 86 | Temp 98.6°F | Resp 18 | Wt 333.0 lb

## 2016-07-09 DIAGNOSIS — Z85038 Personal history of other malignant neoplasm of large intestine: Secondary | ICD-10-CM

## 2016-07-09 DIAGNOSIS — C187 Malignant neoplasm of sigmoid colon: Secondary | ICD-10-CM

## 2016-07-09 DIAGNOSIS — D509 Iron deficiency anemia, unspecified: Secondary | ICD-10-CM

## 2016-07-09 DIAGNOSIS — D508 Other iron deficiency anemias: Secondary | ICD-10-CM

## 2016-07-09 LAB — COMPREHENSIVE METABOLIC PANEL
ALBUMIN: 3.8 g/dL (ref 3.5–5.0)
ALK PHOS: 88 U/L (ref 40–150)
ALT: 20 U/L (ref 0–55)
ANION GAP: 11 meq/L (ref 3–11)
AST: 14 U/L (ref 5–34)
BUN: 21.4 mg/dL (ref 7.0–26.0)
CALCIUM: 9.5 mg/dL (ref 8.4–10.4)
CHLORIDE: 105 meq/L (ref 98–109)
CO2: 24 mEq/L (ref 22–29)
CREATININE: 1.1 mg/dL (ref 0.7–1.3)
EGFR: 74 mL/min/{1.73_m2} — ABNORMAL LOW (ref >90)
Glucose: 113 mg/dl (ref 70–140)
POTASSIUM: 4.1 meq/L (ref 3.5–5.1)
Sodium: 140 mEq/L (ref 136–145)
Total Bilirubin: 0.67 mg/dL (ref 0.20–1.20)
Total Protein: 7.2 g/dL (ref 6.4–8.3)

## 2016-07-09 LAB — CBC WITH DIFFERENTIAL (CANCER CENTER ONLY)
BASO#: 0 10*3/uL (ref 0.0–0.2)
BASO%: 0.3 % (ref 0.0–2.0)
EOS ABS: 0.3 10*3/uL (ref 0.0–0.5)
EOS%: 2.7 % (ref 0.0–7.0)
HEMATOCRIT: 40.5 % (ref 38.7–49.9)
HGB: 13.8 g/dL (ref 13.0–17.1)
LYMPH#: 2.5 10*3/uL (ref 0.9–3.3)
LYMPH%: 20.4 % (ref 14.0–48.0)
MCH: 27.4 pg — ABNORMAL LOW (ref 28.0–33.4)
MCHC: 34.1 g/dL (ref 32.0–35.9)
MCV: 81 fL — AB (ref 82–98)
MONO#: 0.7 10*3/uL (ref 0.1–0.9)
MONO%: 5.7 % (ref 0.0–13.0)
NEUT%: 70.9 % (ref 40.0–80.0)
NEUTROS ABS: 8.7 10*3/uL — AB (ref 1.5–6.5)
Platelets: 284 10*3/uL (ref 145–400)
RBC: 5.03 10*6/uL (ref 4.20–5.70)
RDW: 14.2 % (ref 11.1–15.7)
WBC: 12.3 10*3/uL — ABNORMAL HIGH (ref 4.0–10.0)

## 2016-07-09 LAB — CEA (IN HOUSE-CHCC): CEA (CHCC-IN HOUSE): 1.51 ng/mL (ref 0.00–5.00)

## 2016-07-09 NOTE — Progress Notes (Signed)
Hematology and Oncology Follow Up Visit  Brandon Schaefer [id number] March 05, 1956 61 y.o. 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 He is doing well and getting over a mild sinus infection. He is staying busy with work.  He has no complaints at this time. His CEA in September was 1.7. He has occasional constipation and will take a stool softener as needed.  He is excited to tell us he has curbed his diet and has lost 89 lbs since his last visit. He is feeling much better and plans to continue his weight loss journey.  He has maintained a healthy appetite and is staying well hydrated.  His blood sugars have been well controlled with the weight loss. He has been able to reduce his required amount of daily insulin.  His lower extremity swelling and cellulitis has resolved. Her has some hyperpigmentation of the shins where he previously had cellulitis.   No fever, chills, n/v, cough, rash, dizziness, SOB, chest pain, palpitations, abdominal pain or changes in bowel or bladder habits.  No lymphadenopathy found on exam. No episodes of bleeding, bruising or petechiae.  The neuropathy in his feet is unchanged. No falls or syncopal episodes.   Medications:  Allergies as of 07/09/2016      Reactions   Adhesive [tape] Rash      Medication List       Accurate as of 07/09/16 10:13 PM. Always use your most recent med list.          aspirin EC 81 MG tablet Take 81 mg by mouth daily.   aspirin 81 MG tablet Take by mouth.   carvedilol 6.25 MG tablet Commonly known as:  COREG Take 1 tablet (6.25 mg total) by mouth 2 (two) times daily with a meal. ---- Office visit needed for further refills   famotidine 40 MG tablet Commonly known as:  PEPCID Take 1 tablet (40 mg total) by mouth 2 (two)  times daily.   furosemide 40 MG tablet Commonly known as:  LASIX Take 1 tablet (40 mg total) by mouth daily. Yearly physical w/labs are due must see Md for future refills   insulin lispro protamine-lispro (75-25) 100 UNIT/ML Susp injection Commonly known as:  HUMALOG MIX 75/25 100 units in the am and 60 units at night   magic mouthwash w/lidocaine Soln Take 10 mLs by mouth 4 (four) times daily as needed for mouth pain.   meloxicam 15 MG tablet Commonly known as:  MOBIC TAKE 1 TABLET(15 MG) BY MOUTH DAILY   metFORMIN 1000 MG tablet Commonly known as:  GLUCOPHAGE Take 1,000 mg by mouth 2 (two) times daily with a meal.   morphine 30 MG 12 hr tablet Commonly known as:  MS CONTIN   MOVANTIK 25 MG Tabs tablet Generic drug:  naloxegol oxalate TK 1 T PO QD   OPANA ER (CRUSH RESISTANT) 20 MG T12a Generic drug:  Oxymorphone HCl (Crush Resist) Take 20 mg by mouth 2 (two) times daily.   oxyCODONE 15 MG immediate release tablet Commonly known as:  ROXICODONE TK 1 T PO FOUR TO FIVE TIMES A DAY as needed for severe pain   potassium chloride SA 20 MEQ tablet Commonly known as:  K-DUR,KLOR-CON TAKE 1 TABLET(20 MEQ) BY MOUTH DAILY   ramipril 10 MG capsule Commonly known  as:  ALTACE TAKE ONE CAPSULE BY MOUTH DAILY BEFORE BREAKFAST   rosuvastatin 20 MG tablet Commonly known as:  CRESTOR Take 1 tablet (20 mg total) by mouth daily before breakfast.   rosuvastatin 20 MG tablet Commonly known as:  CRESTOR Take 1 tablet by mouth daily before breakfast --- Office visit needed for further refills   senna-docusate 8.6-50 MG tablet Commonly known as:  Senokot-S Take 1 tablet by mouth 2 (two) times daily. While taking pain meds to prevent constipation   sertraline 100 MG tablet Commonly known as:  ZOLOFT Take 1.5 tablets (150 mg total) by mouth daily before breakfast.   TRADJENTA 5 MG Tabs tablet Generic drug:  linagliptin TAKE 1 TABLET(5 MG) BY MOUTH DAILY       Allergies:    Allergies  Allergen Reactions  . Adhesive [Tape] Rash    Past Medical History, Surgical history, Social history, and Family History were reviewed and updated.  Review of Systems: All other 10 point review of systems is negative.   Physical Exam:  weight is 333 lb (151 kg) (abnormal). His oral temperature is 98.6 F (37 C). His blood pressure is 125/48 (abnormal) and his pulse is 86. His respiration is 18 and oxygen saturation is 94%.   Wt Readings from Last 3 Encounters:  07/09/16 (!) 333 lb (151 kg)  01/09/16 (!) 378 lb (171.5 kg)  12/22/15 (!) 381 lb (172.8 kg)    Ocular: Sclerae unicteric, pupils equal, round and reactive to light Ear-nose-throat: Oropharynx clear, dentition fair Lymphatic: No cervical, supraclavicular or axillary adenopathy Lungs no rales or rhonchi, good excursion bilaterally Heart regular rate and rhythm, no murmur appreciated Abd soft, nontender, positive bowel sounds, no liver or spleen tip palpated on exam  MSK no focal spinal tenderness, no joint edema Neuro: non-focal, well-oriented, appropriate affect Breasts: Deferred   Lab Results  Component Value Date   WBC 12.3 (H) 07/09/2016   HGB 13.8 07/09/2016   HCT 40.5 07/09/2016   MCV 81 (L) 07/09/2016   PLT 284 07/09/2016   Lab Results  Component Value Date   FERRITIN 85 06/16/2014   IRON 58 06/16/2014   TIBC 336 06/16/2014   UIBC 279 06/16/2014   IRONPCTSAT 17 (L) 06/16/2014   Lab Results  Component Value Date   RBC 5.03 07/09/2016   No results found for: KPAFRELGTCHN, LAMBDASER, KAPLAMBRATIO No results found for: IGGSERUM, IGA, IGMSERUM No results found for: Odetta Pink, SPEI   Chemistry      Component Value Date/Time   NA 140 07/09/2016 0840   K 4.1 07/09/2016 0840   CL 103 12/22/2015 1416   CL 98 12/22/2014 0751   CO2 24 07/09/2016 0840   BUN 21.4 07/09/2016 0840   CREATININE 1.1 07/09/2016 0840      Component Value  Date/Time   CALCIUM 9.5 07/09/2016 0840   ALKPHOS 88 07/09/2016 0840   AST 14 07/09/2016 0840   ALT 20 07/09/2016 0840   BILITOT 0.67 07/09/2016 0840     Impression and Plan: Brandon Schaefer is 61 yo gentleman with a history of a stage II colon cancer. He completed chemotherapy in June 2014. He was felt to be at high risk for recurrence due to microsatellite stability. So far, he has done well and there has been no evidence of recurrence. His CEA today was stable at 1.51.  He has worked hard and lost 89 lbs since his last visit. This has helped resolve his leg  swelling and cellulitis as well as helped better control his blood sugars and reduce the amount of insulin he needs daily. We are very proud of his determination.  We will plan to see him back again in 6 months for lab work and follow-up.  He will contact our office with any questions or concerns. We can certainly see him sooner if need be.   Eliezer Bottom, NP 3/27/201810:13 PM

## 2016-07-10 LAB — CEA: CEA: 1.6 ng/mL (ref 0.0–4.7)

## 2016-07-21 ENCOUNTER — Other Ambulatory Visit: Payer: Self-pay | Admitting: Internal Medicine

## 2016-07-24 ENCOUNTER — Ambulatory Visit (INDEPENDENT_AMBULATORY_CARE_PROVIDER_SITE_OTHER): Payer: 59 | Admitting: Internal Medicine

## 2016-07-24 ENCOUNTER — Encounter: Payer: Self-pay | Admitting: Internal Medicine

## 2016-07-24 VITALS — BP 110/76 | HR 77 | Temp 98.5°F | Resp 18 | Ht 72.0 in | Wt 331.0 lb

## 2016-07-24 DIAGNOSIS — F419 Anxiety disorder, unspecified: Secondary | ICD-10-CM

## 2016-07-24 DIAGNOSIS — G64 Other disorders of peripheral nervous system: Secondary | ICD-10-CM

## 2016-07-24 DIAGNOSIS — G629 Polyneuropathy, unspecified: Secondary | ICD-10-CM

## 2016-07-24 DIAGNOSIS — E119 Type 2 diabetes mellitus without complications: Secondary | ICD-10-CM | POA: Diagnosis not present

## 2016-07-24 DIAGNOSIS — R600 Localized edema: Secondary | ICD-10-CM | POA: Diagnosis not present

## 2016-07-24 DIAGNOSIS — Z794 Long term (current) use of insulin: Secondary | ICD-10-CM

## 2016-07-24 DIAGNOSIS — I10 Essential (primary) hypertension: Secondary | ICD-10-CM

## 2016-07-24 MED ORDER — MELOXICAM 15 MG PO TABS: ORAL_TABLET | ORAL | 1 refills | Status: DC

## 2016-07-24 MED ORDER — ROSUVASTATIN CALCIUM 20 MG PO TABS: 20.0000 mg | ORAL_TABLET | Freq: Every day | ORAL | 1 refills | Status: DC

## 2016-07-24 MED ORDER — CARVEDILOL 6.25 MG PO TABS: 6.2500 mg | ORAL_TABLET | Freq: Two times a day (BID) | ORAL | 1 refills | Status: DC

## 2016-07-24 MED ORDER — RAMIPRIL 10 MG PO CAPS: 10.0000 mg | ORAL_CAPSULE | Freq: Every day | ORAL | 1 refills | Status: DC

## 2016-07-24 NOTE — Assessment & Plan Note (Signed)
Insulin dose is being decreased by Dr Carlis Abbott secondary to his weight loss He will continue weight loss efforts and diabetic diet Management per Dr Carlis Abbott Last a1c 4.9%

## 2016-07-24 NOTE — Assessment & Plan Note (Signed)
Follows with podiatry 

## 2016-07-24 NOTE — Assessment & Plan Note (Signed)
BP well controlled Current regimen effective and well tolerated Continue current medications at current doses - if he continues to lose weight -medication may need to be decreased

## 2016-07-24 NOTE — Progress Notes (Signed)
Pre visit review using our clinic review tool, if applicable. No additional management support is needed unless otherwise documented below in the visit note. 

## 2016-07-24 NOTE — Progress Notes (Signed)
Subjective:    Patient ID: Brandon Schaefer, male    DOB: Sep 15, 1955, 61 y.o.   MRN: [id number]  HPI The patient is here for follow up.  He continues to lose weight and has lost over 100 lbs.    Diabetes: He follows with Dr Carlis Abbott.  He is taking his medication daily as prescribed. His insulin dose has decreased significantly.  He is compliant with a diabetic diet. He checks his feet daily and denies foot lesions. He is up-to-date with an ophthalmology examination.   Hypertension: He is taking his medication daily. He is compliant with a low sodium diet.  He denies chest pain, palpitations, shortness of breath and regular headaches. He is doing some walking.    Peripheral neuropathy, from chemotherapy:  Some days his pain is bed, others not.  He has some numbness in his toes. He does not feel this limits his walking or affects his balance.   Edema: Since he has lost so much his swelling in his legs has been much better.  He is taking the lasix daily as prescribed.    h/o colon cancer:  He is up to date with oncology and is remission.  He is currently is just in observation.  He gets CT scan and CEA by oncology.    Anxiety: He is taking his medication daily as prescribed. He denies any side effects from the medication. He feels his anxiety is well controlled and he is happy with his current dose of medication.    Medications and allergies reviewed with patient and updated if appropriate.  Patient Active Problem List   Diagnosis Date Noted  . Cellulitis of foot 12/06/2015  . Pressure ulcer 11/27/2015  . Sepsis due to cellulitis ([place]) 11/26/2015  . Right medial malleolar fracture 11/26/2015  . Anxiety 11/26/2015  . Hyperlipidemia 11/26/2015  . Bilateral leg edema 08/22/2015  . Dental abscess 08/03/2015  . Back pain 03/20/2015  . Anemia, iron deficiency 05/05/2012  . DM (diabetes mellitus) ([place]) 01/18/2012  . HTN (hypertension) 01/18/2012  . Morbid obesity with body mass index of  60.0-69.9 in adult ([place]) 01/17/2012  . Respiratory failure, post-operative ([place]) 01/17/2012  . Cancer of sigmoid colon ([place]) 12/23/2011  . Arthritis of knee, degenerative 08/09/2011  . 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 aspirin 81 MG tablet Take by mouth.    [name] aspirin EC 81 MG tablet Take 81 mg by mouth daily.    . carvedilol (COREG) 6.25 MG tablet Take 1 tablet (6.25 mg total) by mouth 2 (two) times daily with a meal. ---- Office visit needed for further refills 180 tablet 0  . famotidine (PEPCID) 40 MG tablet Take 1 tablet (40 mg total) by mouth 2 (two) times daily. 60 tablet 0  . furosemide (LASIX) 40 MG tablet Take 1 tablet (40 mg total) by mouth daily. Yearly physical w/labs are due must see Md for future refills 30 tablet 0  . insulin lispro protamine-lispro (HUMALOG MIX 75/25) (75-25) 100 UNIT/ML SUSP injection 100 units in the am and 60 units at night 10 mL 11  . magic mouthwash w/lidocaine SOLN Take 10 mLs by mouth 4 (four) times daily as needed for mouth pain. 100 mL 0  . meloxicam (MOBIC) 15 MG tablet TAKE 1 TABLET(15 MG) BY MOUTH DAILY 90 tablet 1  . metFORMIN (GLUCOPHAGE) 1000 MG tablet Take 1,000 mg by mouth 2 (two)  times daily with a meal.     . morphine (MS CONTIN) 30 MG 12 hr tablet     . MOVANTIK 25 MG TABS tablet TK 1 T PO QD  2  . OPANA ER, CRUSH RESISTANT, 20 MG T12A Take 20 mg by mouth 2 (two) times daily.    . potassium chloride SA (K-DUR,KLOR-CON) 20 MEQ tablet TAKE 1 TABLET(20 MEQ) BY MOUTH DAILY 90 tablet 1  . ramipril (ALTACE) 10 MG capsule TAKE ONE CAPSULE BY MOUTH DAILY BEFORE BREAKFAST (Patient taking differently: Take 10 mg by mouth daily before breakfast. ) 90 capsule 2  . rosuvastatin (CRESTOR) 20 MG tablet Take 1 tablet (20 mg total) by mouth daily before breakfast. 90 tablet 1  . senna-docusate (SENOKOT-S) 8.6-50 MG per tablet Take 1 tablet by mouth 2 (two)  times daily. While taking pain meds to prevent constipation (Patient taking differently: Take 1 tablet by mouth 2 (two) times daily as needed for moderate constipation. While taking pain meds to prevent constipation) 30 tablet 0  . sertraline (ZOLOFT) 100 MG tablet TAKE 1 AND 1/2 TABLETS(150 MG) BY MOUTH DAILY BEFORE BREAKFAST 135 tablet 0  . TRADJENTA 5 MG TABS tablet TAKE 1 TABLET(5 MG) BY MOUTH DAILY 90 tablet 1   No current facility-administered medications on file prior to visit.     Past Medical History:  Diagnosis Date  . Allergy   . Anemia   . Anxiety   . colon ca dx'd 11/2011  . Diabetes mellitus   . Headache(784.0)   . History of blood transfusion   . Hyperlipidemia   . Hypertension   . Neuromuscular disorder ([place])    peripheral neuropathy  . Shortness of breath    with exertion    Past Surgical History:  Procedure Laterality Date  . COMPLEX WOUND CLOSURE N/A 01/06/2013   Procedure: EXCISION CHRONIC ABDOMINAL WOUND;  Surgeon: Harl Bowie, MD;  Location: [facility];  Service: General;  Laterality: N/A;  . CYSTOSCOPY W/ URETERAL STENT PLACEMENT Bilateral 01/04/2013   Procedure: CYSTOSCOPY WITH RETROGRADE PYELOGRAM/Right double J URETERAL STENT PLACEMENT;  Surgeon: Alexis Frock, MD;  Location: [facility];  Service: Urology;  Laterality: Bilateral;  . CYSTOSCOPY WITH RETROGRADE PYELOGRAM, URETEROSCOPY AND STENT PLACEMENT Right 01/06/2013   Procedure: CYSTOSCOPY WITH RIGHT RETROGRADE PYELOGRAM, URETEROSCOPY AND BILATERAL STENT EXCHANGE, BILATERAL STONE BASKET EXTRACTION ;  Surgeon: Alexis Frock, MD;  Location: [facility];  Service: Urology;  Laterality: Right;  . FRACTURE SURGERY      ORIF-left radius has pin  . HOLMIUM LASER APPLICATION N/A 5/45/6256   Procedure: HOLMIUM LASER APPLICATION;  Surgeon: Alexis Frock, MD;  Location: [facility];  Service: Urology;  Laterality: N/A;  . NEPHROLITHOTOMY Left 01/04/2013   Procedure: NEPHROLITHOTOMY PERCUTANEOUS  LEFT 1ST STAGE PERCUTANEOUS  NEPHROSTOLITHOTOMY;  Surgeon: Alexis Frock, MD;  Location: [facility];  Service: Urology;  Laterality: Left;  . NEPHROLITHOTOMY Left 01/06/2013   Procedure: NEPHROLITHOTOMY PERCUTANEOUS SECOND LOOK;  Surgeon: Alexis Frock, MD;  Location: [facility];  Service: Urology;  Laterality: Left;  . PARTIAL COLECTOMY  01/17/2012   Procedure: PARTIAL COLECTOMY;  Surgeon: Harl Bowie, MD;  Location: [facility];  Service: General;;  . PILONIDAL CYST EXCISION    . PORT-A-CATH REMOVAL Left 01/06/2013   Procedure: REMOVAL PORT-A-CATH;  Surgeon: Harl Bowie, MD;  Location: [facility];  Service: General;  Laterality: Left;  . PORTACATH PLACEMENT  03/19/2012   Procedure: INSERTION PORT-A-CATH;  Surgeon: Harl Bowie, MD;  Location: [facility];  Service: General;  Laterality: N/A;  . PROCTOSCOPY  01/17/2012   Procedure: PROCTOSCOPY;  Surgeon: Harl Bowie, MD;  Location: [facility];  Service: General;;    Social History   Social History  . Marital status: Single    Spouse name: N/A  . Number of children: N/A  . Years of education: N/A   Social History Main Topics  . Smoking status: Never Smoker  . Smokeless tobacco: Never Used     Comment: NEVER USED TOBACC0  . Alcohol use No  . Drug use: No  . Sexual activity: Not on file   Other Topics Concern  . Not on file   Social History Narrative  . No narrative on file    Family History  Problem Relation Age of Onset  . Diabetes Father   . Cancer Maternal Aunt     colon  . Cancer Maternal Grandmother     colon    Review of Systems  Constitutional: Negative for chills and fever.  HENT: Positive for sneezing.   Respiratory: Negative for cough, shortness of breath and wheezing.   Cardiovascular: Positive for leg swelling (controlled, improved). Negative for chest pain and palpitations.  Gastrointestinal: Positive for constipation (controlled). Negative for abdominal pain, blood in stool and diarrhea.  Neurological: Negative  for dizziness, light-headedness and headaches.  Psychiatric/Behavioral: Negative for dysphoric mood. The patient is not nervous/anxious.        Objective:   Vitals:   07/24/16 0835  BP: 110/76  Pulse: 77  Resp: 18  Temp: 98.5 F (36.9 C)   Wt Readings from Last 3 Encounters:  07/24/16 (!) 331 lb (150.1 kg)  07/09/16 (!) 333 lb (151 kg)  01/09/16 (!) 378 lb (171.5 kg)   Body mass index is 44.89 kg/m.   Physical Exam    Constitutional: Appears well-developed and well-nourished. No distress.  HENT:  Head: Normocephalic and atraumatic.  Neck: Neck supple. No tracheal deviation present. No thyromegaly present.  No cervical lymphadenopathy Cardiovascular: Normal rate, regular rhythm and normal heart sounds.   No murmur heard. No carotid bruit .  trace edema Pulmonary/Chest: Effort normal and breath sounds normal. No respiratory distress. No has no wheezes. No rales.  Skin: Skin is warm and dry. Not diaphoretic.  Psychiatric: Normal mood and affect. Behavior is normal.      Assessment & Plan:    See Problem List for Assessment and Plan of chronic medical problems.   FU in 6 months

## 2016-07-24 NOTE — Assessment & Plan Note (Signed)
Controlled with lasix Improved with weight loss

## 2016-07-24 NOTE — Patient Instructions (Addendum)
   No immunizations administered today.   Medications reviewed and updated.  No changes recommended at this time.  Your prescription(s) have been submitted to your pharmacy. Please take as directed and contact our office if you believe you are having problem(s) with the medication(s).   Please followup in 6 months

## 2016-07-24 NOTE — Assessment & Plan Note (Signed)
Controlled, stable Continue current dose of medication  

## 2016-09-10 ENCOUNTER — Other Ambulatory Visit: Payer: Self-pay | Admitting: Internal Medicine

## 2016-11-29 ENCOUNTER — Other Ambulatory Visit: Payer: Self-pay | Admitting: Internal Medicine

## 2017-01-09 ENCOUNTER — Ambulatory Visit (HOSPITAL_BASED_OUTPATIENT_CLINIC_OR_DEPARTMENT_OTHER): Payer: 59 | Admitting: Hematology & Oncology

## 2017-01-09 ENCOUNTER — Other Ambulatory Visit (HOSPITAL_BASED_OUTPATIENT_CLINIC_OR_DEPARTMENT_OTHER): Payer: 59

## 2017-01-09 VITALS — BP 129/58 | HR 87 | Temp 98.6°F | Resp 19 | Wt 332.0 lb

## 2017-01-09 DIAGNOSIS — Z85038 Personal history of other malignant neoplasm of large intestine: Secondary | ICD-10-CM

## 2017-01-09 DIAGNOSIS — C187 Malignant neoplasm of sigmoid colon: Secondary | ICD-10-CM

## 2017-01-09 DIAGNOSIS — D508 Other iron deficiency anemias: Secondary | ICD-10-CM

## 2017-01-09 DIAGNOSIS — D500 Iron deficiency anemia secondary to blood loss (chronic): Secondary | ICD-10-CM

## 2017-01-09 LAB — CBC WITH DIFFERENTIAL (CANCER CENTER ONLY)
BASO#: 0 10*3/uL (ref 0.0–0.2)
BASO%: 0.2 % (ref 0.0–2.0)
EOS%: 2.7 % (ref 0.0–7.0)
Eosinophils Absolute: 0.3 10*3/uL (ref 0.0–0.5)
HCT: 40.2 % (ref 38.7–49.9)
HGB: 13.8 g/dL (ref 13.0–17.1)
LYMPH#: 2 10*3/uL (ref 0.9–3.3)
LYMPH%: 17.4 % (ref 14.0–48.0)
MCH: 29.1 pg (ref 28.0–33.4)
MCHC: 34.3 g/dL (ref 32.0–35.9)
MCV: 85 fL (ref 82–98)
MONO#: 0.7 10*3/uL (ref 0.1–0.9)
MONO%: 5.9 % (ref 0.0–13.0)
NEUT#: 8.3 10*3/uL — ABNORMAL HIGH (ref 1.5–6.5)
NEUT%: 73.8 % (ref 40.0–80.0)
PLATELETS: 267 10*3/uL (ref 145–400)
RBC: 4.74 10*6/uL (ref 4.20–5.70)
RDW: 13.9 % (ref 11.1–15.7)
WBC: 11.2 10*3/uL — AB (ref 4.0–10.0)

## 2017-01-09 LAB — COMPREHENSIVE METABOLIC PANEL
ALT: 14 U/L (ref 0–55)
ANION GAP: 10 meq/L (ref 3–11)
AST: 14 U/L (ref 5–34)
Albumin: 3.6 g/dL (ref 3.5–5.0)
Alkaline Phosphatase: 83 U/L (ref 40–150)
BILIRUBIN TOTAL: 0.52 mg/dL (ref 0.20–1.20)
BUN: 16 mg/dL (ref 7.0–26.0)
CHLORIDE: 105 meq/L (ref 98–109)
CO2: 26 meq/L (ref 22–29)
CREATININE: 1 mg/dL (ref 0.7–1.3)
Calcium: 9.5 mg/dL (ref 8.4–10.4)
EGFR: 81 mL/min/{1.73_m2} — AB (ref >90)
GLUCOSE: 199 mg/dL — AB (ref 70–140)
Potassium: 4.4 mEq/L (ref 3.5–5.1)
SODIUM: 141 meq/L (ref 136–145)
TOTAL PROTEIN: 7 g/dL (ref 6.4–8.3)

## 2017-01-09 LAB — CEA (IN HOUSE-CHCC): CEA (CHCC-In House): 2.39 ng/mL (ref 0.00–5.00)
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
## 2017-01-25 NOTE — Patient Instructions (Addendum)
  All other Health Maintenance issues reviewed.   All recommended immunizations and age-appropriate screenings are up-to-date or discussed.  Flu immunization administered today.   Medications reviewed and updated.   No changes recommended at this time.  Your prescription(s) have been submitted to your pharmacy. Please take as directed and contact our office if you believe you are having problem(s) with the medication(s).   Please followup in 6 months

## 2017-01-25 NOTE — Progress Notes (Signed)
Subjective:    Patient ID: Brandon Schaefer, male    DOB: 12-15-1955, 61 y.o.   MRN: [id number]  HPI The patient is here for follow up.  Diabetes: He is taking his medication daily as prescribed. He is compliant with a diabetic diet. He is exercising regularly - walking. His a1c was 5.7% in August.   He is not up-to-date with an ophthalmology examination.   B/l leg edema:  He takes lasix daily.  He feels his edema is controlled.   Hypertension: He is taking his medication daily. He is compliant with a low sodium diet.  He denies chest pain, palpitations, edema, shortness of breath and regular headaches. He is exercising regularly.  He does not monitor his blood pressure at home.    Hyperlipidemia: He is taking his medication daily. He is compliant with a low fat/cholesterol diet. He is exercising regularly. He denies myalgias.   Anxiety: He is taking his medication daily as prescribed. He denies any side effects from the medication. He feels his anxiety is well controlled and he is happy with his current dose of medication.   OA of knees:  He is taking meloxiam daily and it helps.    Medications and allergies reviewed with patient and updated if appropriate.  Patient Active Problem List   Diagnosis Date Noted  . Cellulitis of foot 12/06/2015  . Sepsis due to cellulitis ([place]) 11/26/2015  . Right medial malleolar fracture 11/26/2015  . Anxiety 11/26/2015  . Hyperlipidemia 11/26/2015  . Bilateral leg edema 08/22/2015  . Back pain 03/20/2015  . Anemia, iron deficiency 05/05/2012  . DM (diabetes mellitus) ([place]) 01/18/2012  . HTN (hypertension) 01/18/2012  . Morbid obesity with body mass index of 60.0-69.9 in adult ([place]) 01/17/2012  . Cancer of sigmoid colon ([place]) 12/23/2011  . Arthritis of knee, degenerative 08/09/2011  . Peripheral nerve disease 01/01/2006    Current Outpatient Prescriptions on File Prior to Visit  Medication Sig Dispense Refill  . aspirin EC 81 MG tablet Take  81 mg by mouth daily.    . carvedilol (COREG) 6.25 MG tablet Take 1 tablet (6.25 mg total) by mouth 2 (two) times daily with a meal. 180 tablet 1  . furosemide (LASIX) 40 MG tablet Take 1 tablet (40 mg total) by mouth daily. 90 tablet 1  . insulin lispro protamine-lispro (HUMALOG MIX 75/25) (75-25) 100 UNIT/ML SUSP injection 100 units in the am and 60 units at night (Patient taking differently: 30 units in am) 10 mL 11  . metFORMIN (GLUCOPHAGE) 1000 MG tablet Take 1,000 mg by mouth 2 (two) times daily with a meal.     . morphine (MS CONTIN) 30 MG 12 hr tablet     . MOVANTIK 25 MG TABS tablet     . NARCAN 4 MG/0.1ML LIQD nasal spray kit     . oxyCODONE (ROXICODONE) 15 MG immediate release tablet TK 1 T PO QID PRN  0  . Oxycodone HCl 10 MG TABS     . potassium chloride SA (K-DUR,KLOR-CON) 20 MEQ tablet TAKE 1 TABLET(20 MEQ) BY MOUTH DAILY 90 tablet 1  . ramipril (ALTACE) 10 MG capsule Take 1 capsule (10 mg total) by mouth daily before breakfast. 90 capsule 1  . rosuvastatin (CRESTOR) 20 MG tablet Take 1 tablet (20 mg total) by mouth daily before breakfast. 90 tablet 1  . senna-docusate (SENOKOT-S) 8.6-50 MG per tablet Take 1 tablet by mouth 2 (two) times daily. While taking pain meds to prevent constipation (  Patient taking differently: Take 1 tablet by mouth 2 (two) times daily as needed for moderate constipation. While taking pain meds to prevent constipation) 30 tablet 0  . sertraline (ZOLOFT) 100 MG tablet TAKE 1 AND 1/2 TABLETS(150 MG) BY MOUTH DAILY BEFORE BREAKFAST 135 tablet 0  . TRADJENTA 5 MG TABS tablet TAKE 1 TABLET(5 MG) BY MOUTH DAILY 90 tablet 0   No current facility-administered medications on file prior to visit.     Past Medical History:  Diagnosis Date  . Allergy   . Anemia   . Anxiety   . colon ca dx'd 11/2011  . Dental abscess 08/03/2015  . Diabetes mellitus   . Headache(784.0)   . History of blood transfusion   . Hyperlipidemia   . Hypertension   . Neuromuscular  disorder ([place])    peripheral neuropathy  . Shortness of breath    with exertion    Past Surgical History:  Procedure Laterality Date  . COMPLEX WOUND CLOSURE N/A 01/06/2013   Procedure: EXCISION CHRONIC ABDOMINAL WOUND;  Surgeon: Harl Bowie, MD;  Location: [facility];  Service: General;  Laterality: N/A;  . CYSTOSCOPY W/ URETERAL STENT PLACEMENT Bilateral 01/04/2013   Procedure: CYSTOSCOPY WITH RETROGRADE PYELOGRAM/Right double J URETERAL STENT PLACEMENT;  Surgeon: Alexis Frock, MD;  Location: [facility];  Service: Urology;  Laterality: Bilateral;  . CYSTOSCOPY WITH RETROGRADE PYELOGRAM, URETEROSCOPY AND STENT PLACEMENT Right 01/06/2013   Procedure: CYSTOSCOPY WITH RIGHT RETROGRADE PYELOGRAM, URETEROSCOPY AND BILATERAL STENT EXCHANGE, BILATERAL STONE BASKET EXTRACTION ;  Surgeon: Alexis Frock, MD;  [facility];  Service: Urology;  Laterality: Right;  . FRACTURE SURGERY      ORIF-left radius has pin  . HOLMIUM LASER APPLICATION N/A 0/34/9179   Procedure: HOLMIUM LASER APPLICATION;  Surgeon: Alexis Frock, MD;  [facility];  Service: Urology;  Laterality: N/A;  . NEPHROLITHOTOMY Left 01/04/2013   Procedure: NEPHROLITHOTOMY PERCUTANEOUS  LEFT 1ST STAGE PERCUTANEOUS NEPHROSTOLITHOTOMY;  Surgeon: Alexis Frock, MD;  [facility];  Service: Urology;  Laterality: Left;  . NEPHROLITHOTOMY Left 01/06/2013   Procedure: NEPHROLITHOTOMY PERCUTANEOUS SECOND LOOK;  Surgeon: Alexis Frock, MD;  [facility];  Service: Urology;  Laterality: Left;  . PARTIAL COLECTOMY  01/17/2012   Procedure: PARTIAL COLECTOMY;  Surgeon: Harl Bowie, MD;  [facility];  Service: General;;  . PILONIDAL CYST EXCISION    . PORT-A-CATH REMOVAL Left 01/06/2013   Procedure: REMOVAL PORT-A-CATH;  Surgeon: Harl Bowie, MD;  [facility];  Service: General;  Laterality: Left;  . PORTACATH PLACEMENT  03/19/2012   Procedure: INSERTION PORT-A-CATH;  Surgeon: Harl Bowie, MD;  Location:  [place];  Service: General;  Laterality: N/A;  . PROCTOSCOPY  01/17/2012   Procedure: PROCTOSCOPY;  Surgeon: Harl Bowie, MD;  [facility];  Service: General;;    Social History   Social History  . Marital status: Single    Spouse name: N/A  . Number of children: N/A  . Years of education: N/A   Social History Main Topics  . Smoking status: Never Smoker  . Smokeless tobacco: Never Used     Comment: NEVER USED TOBACC0  . Alcohol use No  . Drug use: No  . Sexual activity: Not Asked   Other Topics Concern  . None   Social History Narrative  . None    Family History  Problem Relation Age of Onset  . Diabetes Father   . Cancer Maternal Aunt  colon  . Cancer Maternal Grandmother        colon    Review of Systems  Constitutional: Negative for chills and fever.  Respiratory: Negative for cough, shortness of breath and wheezing.   Cardiovascular: Negative for chest pain, palpitations and leg swelling.  Musculoskeletal: Positive for arthralgias. Negative for myalgias.  Neurological: Positive for headaches (occasional). Negative for light-headedness.       Objective:   Vitals:   01/27/17 0844  BP: 138/80  Pulse: 97  Resp: 16  Temp: 98.1 F (36.7 C)  SpO2: 97%   Wt Readings from Last 3 Encounters:  01/27/17 (!) 334 lb (151.5 kg)  01/09/17 (!) 332 lb (150.6 kg)  07/24/16 (!) 331 lb (150.1 kg)   Body mass index is 45.3 kg/m.   Physical Exam    Constitutional: Appears well-developed and well-nourished. No distress.  HENT:  Head: Normocephalic and atraumatic.  Neck: Neck supple. No tracheal deviation present. No thyromegaly present.  No cervical lymphadenopathy Cardiovascular: Normal rate, regular rhythm and normal heart sounds.   2/6 sys murmur heard. No carotid bruit .  No edema Pulmonary/Chest: Effort normal and breath sounds normal. No respiratory distress. No has no wheezes. No rales.  Skin: Skin is warm and dry. Not  diaphoretic.  Psychiatric: Normal mood and affect. Behavior is normal.      Assessment & Plan:    See Problem List for Assessment and Plan of chronic medical problems.

## 2017-01-27 ENCOUNTER — Encounter: Payer: Self-pay | Admitting: Internal Medicine

## 2017-01-27 ENCOUNTER — Ambulatory Visit (INDEPENDENT_AMBULATORY_CARE_PROVIDER_SITE_OTHER): Payer: 59 | Admitting: Internal Medicine

## 2017-01-27 VITALS — BP 138/80 | HR 97 | Temp 98.1°F | Resp 16 | Ht 72.0 in | Wt 334.0 lb

## 2017-01-27 DIAGNOSIS — Z794 Long term (current) use of insulin: Secondary | ICD-10-CM

## 2017-01-27 DIAGNOSIS — F419 Anxiety disorder, unspecified: Secondary | ICD-10-CM

## 2017-01-27 DIAGNOSIS — I10 Essential (primary) hypertension: Secondary | ICD-10-CM | POA: Diagnosis not present

## 2017-01-27 DIAGNOSIS — E119 Type 2 diabetes mellitus without complications: Secondary | ICD-10-CM

## 2017-01-27 DIAGNOSIS — R600 Localized edema: Secondary | ICD-10-CM | POA: Diagnosis not present

## 2017-01-27 DIAGNOSIS — Z23 Encounter for immunization: Secondary | ICD-10-CM | POA: Diagnosis not present

## 2017-01-27 MED ORDER — CARVEDILOL 6.25 MG PO TABS: 6.2500 mg | ORAL_TABLET | Freq: Two times a day (BID) | ORAL | 3 refills | Status: DC

## 2017-01-27 MED ORDER — ROSUVASTATIN CALCIUM 20 MG PO TABS: 20.0000 mg | ORAL_TABLET | Freq: Every day | ORAL | 3 refills | Status: DC

## 2017-01-27 MED ORDER — RAMIPRIL 10 MG PO CAPS: 10.0000 mg | ORAL_CAPSULE | Freq: Every day | ORAL | 1 refills | Status: DC

## 2017-01-27 MED ORDER — LINAGLIPTIN 5 MG PO TABS
ORAL_TABLET | ORAL | 3 refills | Status: DC
Start: 2017-01-27 — End: 2017-05-29

## 2017-01-27 MED ORDER — MELOXICAM 15 MG PO TABS: ORAL_TABLET | ORAL | 3 refills | Status: DC

## 2017-01-27 MED ORDER — FUROSEMIDE 40 MG PO TABS: 40.0000 mg | ORAL_TABLET | Freq: Every day | ORAL | 3 refills | Status: DC

## 2017-01-27 NOTE — Assessment & Plan Note (Signed)
Taking lasix daily Controlled

## 2017-01-27 NOTE — Assessment & Plan Note (Signed)
BP well controlled Current regimen effective and well tolerated Continue current medications at current doses cmp  

## 2017-01-27 NOTE — Assessment & Plan Note (Signed)
Management per Dr Carlis Abbott a1c 5.7% in August

## 2017-01-27 NOTE — Assessment & Plan Note (Signed)
Controlled, stable Continue current dose of medication  

## 2017-03-13 ENCOUNTER — Other Ambulatory Visit: Payer: Self-pay | Admitting: Internal Medicine

## 2017-04-21 ENCOUNTER — Ambulatory Visit: Payer: Self-pay | Admitting: *Deleted

## 2017-04-21 NOTE — Telephone Encounter (Signed)
Pt states both feet started swelling and both legs are red but not swollen; symptoms started yesterday; he has a previous history of cellulitis August 2017; he states that this is the same symptoms as he had before; he also thinks that he is holding water; nurse triage initiated;per recommendation should see a physician within 24 hours; Dr Quay Burow has no appointments available and pt says he has an appointment with eye doctor at 10 am on 04/22/17; pt offered and accepted appointment with Dr Cathlean Cower at 1515 on 04/22/17; pt verbalizes understanding. Reason for Disposition . [1] Looks infected (spreading redness, pus) AND [2] no fever  Answer Assessment - Initial Assessment Questions 1. APPEARANCE of RASH: "Describe the rash."      Red, flat with raised areas; blister like areas also 2. LOCATION: "Where is the rash located?"      Both leg 3. NUMBER: "How many spots are there?"      To many raised areas to count 4. SIZE: "How big are the spots?" (Inches, centimeters or compare to size of a coin)      Baseball size on right; golf ball on left 5. ONSET: "When did the rash start?"      04/20/17 6. ITCHING: "Does the rash itch?" If so, ask: "How bad is the itch?"  (Scale 1-10; or mild, moderate, severe)     mild 7. PAIN: "Does the rash hurt?" If so, ask: "How bad is the pain?"  (Scale 1-10; or mild, moderate, severe)     no 8. OTHER SYMPTOMS: "Do you have any other symptoms?" (e.g., fever)     no 9. PREGNANCY: "Is there any chance you are pregnant?" "When was your last menstrual period?"     n/a  Protocols used: RASH OR REDNESS - LOCALIZED-A-AH

## 2017-04-22 ENCOUNTER — Encounter: Payer: Self-pay | Admitting: Internal Medicine

## 2017-04-22 ENCOUNTER — Ambulatory Visit: Payer: BLUE CROSS/BLUE SHIELD | Admitting: Internal Medicine

## 2017-04-22 VITALS — BP 128/82 | HR 81 | Temp 97.8°F | Ht 72.0 in | Wt 357.0 lb

## 2017-04-22 DIAGNOSIS — E119 Type 2 diabetes mellitus without complications: Secondary | ICD-10-CM

## 2017-04-22 DIAGNOSIS — R600 Localized edema: Secondary | ICD-10-CM

## 2017-04-22 DIAGNOSIS — Z794 Long term (current) use of insulin: Secondary | ICD-10-CM | POA: Diagnosis not present

## 2017-04-22 DIAGNOSIS — I10 Essential (primary) hypertension: Secondary | ICD-10-CM | POA: Diagnosis not present

## 2017-04-22 LAB — HM DIABETES EYE EXAM

## 2017-04-22 MED ORDER — POTASSIUM CHLORIDE ER 10 MEQ PO TBCR: 10.0000 meq | EXTENDED_RELEASE_TABLET | Freq: Every day | ORAL | 3 refills | Status: DC

## 2017-04-22 MED ORDER — FUROSEMIDE 40 MG PO TABS: 40.0000 mg | ORAL_TABLET | Freq: Two times a day (BID) | ORAL | 3 refills | Status: DC

## 2017-04-22 NOTE — Assessment & Plan Note (Signed)
stable overall by history and exam, recent data reviewed with pt, and pt to continue medical treatment as before,  to f/u any worsening symptoms or concerns, but will need ongoing f/u with PCP

## 2017-04-22 NOTE — Progress Notes (Signed)
Subjective:    Patient ID: Brandon Schaefer, male    DOB: 04/25/1955, 62 y.o.   MRN: [id number]  HPI  Here to f/u with c/o 20 lb wt increase over only 4-5 days with increased LE edema and slight distal LE redness without fever despite lasix 40 qd good compliance, onset in only 4-5 days. May have had more salt than usual with recent holiday foods.   Pt denies chest pain, increased sob or doe, wheezing, orthopnea, PND, palpitations, dizziness or syncope. Last echo aug 2017 with normal EF, diast fxn unable to assess  May have had more salt in diet over holidays, remains morbid obese as well.   Pt denies polydipsia, polyuria, but cbg's higher as well, no longer sees Brandon Schaefer endo as he has retired, last a1c 5.7. End of November 2018.   Wt Readings from Last 3 Encounters:  04/22/17 (!) 357 lb (161.9 kg)  01/27/17 (!) 334 lb (151.5 kg)  01/09/17 (!) 332 lb (150.6 kg)   Past Medical History:  Diagnosis Date  . Allergy   . Anemia   . Anxiety   . colon ca dx'd 11/2011  . Dental abscess 08/03/2015  . Diabetes mellitus   . Headache(784.0)   . History of blood transfusion   . Hyperlipidemia   . Hypertension   . Neuromuscular disorder ([place])    peripheral neuropathy  . Shortness of breath    with exertion   Past Surgical History:  Procedure Laterality Date  . COMPLEX WOUND CLOSURE N/A 01/06/2013   Procedure: EXCISION CHRONIC ABDOMINAL WOUND;  Surgeon: Brandon Bowie, MD;  [facility];  Service: General;  Laterality: N/A;  . CYSTOSCOPY W/ URETERAL STENT PLACEMENT Bilateral 01/04/2013   Procedure: CYSTOSCOPY WITH RETROGRADE PYELOGRAM/Right double J URETERAL STENT PLACEMENT;  Surgeon: Brandon Frock, MD;  [facility];  Service: Urology;  Laterality: Bilateral;  . CYSTOSCOPY WITH RETROGRADE PYELOGRAM, URETEROSCOPY AND STENT PLACEMENT Right 01/06/2013   Procedure: CYSTOSCOPY WITH RIGHT RETROGRADE PYELOGRAM, URETEROSCOPY AND BILATERAL STENT EXCHANGE, BILATERAL STONE BASKET EXTRACTION ;   Surgeon: Brandon Frock, MD;  [facility];  Service: Urology;  Laterality: Right;  . FRACTURE SURGERY      ORIF-left radius has pin  . HOLMIUM LASER APPLICATION N/A 4/53/6468   Procedure: HOLMIUM LASER APPLICATION;  Surgeon: Brandon Frock, MD;  [facility];  Service: Urology;  Laterality: N/A;  . NEPHROLITHOTOMY Left 01/04/2013   Procedure: NEPHROLITHOTOMY PERCUTANEOUS  LEFT 1ST STAGE PERCUTANEOUS NEPHROSTOLITHOTOMY;  Surgeon: Brandon Frock, MD;  [facility];  Service: Urology;  Laterality: Left;  . NEPHROLITHOTOMY Left 01/06/2013   Procedure: NEPHROLITHOTOMY PERCUTANEOUS SECOND LOOK;  Surgeon: Brandon Frock, MD;  [facility];  Service: Urology;  Laterality: Left;  . PARTIAL COLECTOMY  01/17/2012   Procedure: PARTIAL COLECTOMY;  Surgeon: Brandon Bowie, MD;  [facility];  Service: General;;  . PILONIDAL CYST EXCISION    . PORT-A-CATH REMOVAL Left 01/06/2013   Procedure: REMOVAL PORT-A-CATH;  Surgeon: Brandon Bowie, MD;  [facility];  Service: General;  Laterality: Left;  . PORTACATH PLACEMENT  03/19/2012   Procedure: INSERTION PORT-A-CATH;  Surgeon: Brandon Bowie, MD;  Location: [place];  Service: General;  Laterality: N/A;  . PROCTOSCOPY  01/17/2012   Procedure: PROCTOSCOPY;  Surgeon: Brandon Bowie, MD;  [facility];  Service: General;;    reports that  has never smoked. he has never used smokeless tobacco. He reports that he does not drink alcohol or use  drugs. family history includes Cancer in his maternal aunt and maternal grandmother; Diabetes in his father. Allergies  Allergen Reactions  . Adhesive [Tape] Rash   Current Outpatient Medications on File Prior to Visit  Medication Sig Dispense Refill  . aspirin EC 81 MG tablet Take 81 mg by mouth daily.    . carvedilol (COREG) 6.25 MG tablet Take 1 tablet (6.25 mg total) by mouth 2 (two) times daily with a meal. 180 tablet 3  . insulin lispro protamine-lispro (HUMALOG  MIX 75/25) (75-25) 100 UNIT/ML SUSP injection 100 units in the am and 60 units at night (Patient taking differently: 30 units in am) 10 mL 11  . linagliptin (TRADJENTA) 5 MG TABS tablet TAKE 1 TABLET(5 MG) BY MOUTH DAILY 90 tablet 3  . meloxicam (MOBIC) 15 MG tablet TAKE 1 TABLET(15 MG) BY MOUTH DAILY 90 tablet 3  . metFORMIN (GLUCOPHAGE) 1000 MG tablet Take 1,000 mg by mouth 2 (two) times daily with a meal.     . morphine (MS CONTIN) 30 MG 12 hr tablet     . MOVANTIK 25 MG TABS tablet     . NARCAN 4 MG/0.1ML LIQD nasal spray kit     . oxyCODONE (ROXICODONE) 15 MG immediate release tablet TK 1 T PO QID PRN  0  . Oxycodone HCl 10 MG TABS     . ramipril (ALTACE) 10 MG capsule Take 1 capsule (10 mg total) by mouth daily before breakfast. 90 capsule 1  . rosuvastatin (CRESTOR) 20 MG tablet Take 1 tablet (20 mg total) by mouth daily before breakfast. 90 tablet 3  . senna-docusate (SENOKOT-S) 8.6-50 MG per tablet Take 1 tablet by mouth 2 (two) times daily. While taking pain meds to prevent constipation (Patient taking differently: Take 1 tablet by mouth 2 (two) times daily as needed for moderate constipation. While taking pain meds to prevent constipation) 30 tablet 0  . sertraline (ZOLOFT) 100 MG tablet TAKE 1 AND 1/2 TABLETS(150 MG) BY MOUTH DAILY BEFORE BREAKFAST 135 tablet 0   No current facility-administered medications on file prior to visit.    Review of Systems  Constitutional: Negative for other unusual diaphoresis or sweats HENT: Negative for ear discharge or swelling Eyes: Negative for other worsening visual disturbances Respiratory: Negative for stridor or other swelling  Gastrointestinal: Negative for worsening distension or other blood Genitourinary: Negative for retention or other urinary change Musculoskeletal: Negative for other MSK pain or swelling Skin: Negative for color change or other new lesions Neurological: Negative for worsening tremors and other numbness    Psychiatric/Behavioral: Negative for worsening agitation or other fatigue All other system neg per pt    Objective:   Physical Exam BP 128/82   Pulse 81   Temp 97.8 F (36.6 C) (Oral)   Ht 6' (1.829 m)   Wt (!) 357 lb (161.9 kg)   SpO2 99%   BMI 48.42 kg/m  VS noted, morbid obese, not ill appearing Constitutional: Pt appears in NAD HENT: Head: NCAT.  Right Ear: External ear normal.  Left Ear: External ear normal.  Eyes: . Pupils are equal, round, and reactive to light. Conjunctivae and EOM are normal Nose: without d/c or deformity Neck: Neck supple. Gross normal ROM Cardiovascular: Normal rate and regular rhythm.   Pulmonary/Chest: Effort normal and breath sounds without rales or wheezing.  Abd:  Soft, NT, ND, + BS, no organomegaly Neurological: Pt is alert. At baseline orientation, motor grossly intact Skin: Skin is warm. No rashes, other new lesions,  2-3+ bilat LE edema to knees Psychiatric: Pt behavior is normal without agitation  No other exam findings  Lab Results  Component Value Date   WBC 11.2 (H) 01/09/2017   HGB 13.8 01/09/2017   HCT 40.2 01/09/2017   PLT 267 01/09/2017   GLUCOSE 199 (H) 01/09/2017   CHOL 79 12/22/2015   TRIG 159.0 (H) 12/22/2015   HDL 28.20 (L) 12/22/2015   LDLCALC 19 12/22/2015   ALT 14 01/09/2017   AST 14 01/09/2017   NA 141 01/09/2017   K 4.4 01/09/2017   CL 103 12/22/2015   CREATININE 1.0 01/09/2017   BUN 16.0 01/09/2017   CO2 26 01/09/2017   TSH 1.95 12/22/2015   HGBA1C 6.9 (H) 12/22/2015   MICROALBUR <0.7 12/22/2015         Assessment & Plan:

## 2017-04-22 NOTE — Patient Instructions (Signed)
Ok to increase the lasix to 40 mg twice per day  Please take all new medication as prescribed - the potassium pill in the AM  Please be sure to check your wt first thing every morning, write them down and bring to your next appt, as the goal is to lose 1/2 - 1 pound per day  Please return in 7-10 days with Dr Quay Burow, as you will need an exam and probably blood testing  Please continue all other medications as before, and refills have been done if requested.  Please have the pharmacy call with any other refills you may need.  Please continue your efforts at being more active, low cholesterol diet, and weight control.  Please keep your appointments with your specialists as you may have planned

## 2017-04-22 NOTE — Assessment & Plan Note (Signed)
stable overall by history and exam, recent data reviewed with pt, and pt to continue medical treatment as before,  to f/u any worsening symptoms or concerns BP Readings from Last 3 Encounters:  04/22/17 128/82  01/27/17 138/80  01/09/17 (!) 129/58

## 2017-04-22 NOTE — Assessment & Plan Note (Addendum)
I suspect has underlying diast dysfn related CHF possibly due to DM and obesity, though could not be determined by TTE aug 2017; declines BNP today, for lasix increased to 40 bid, start Kdur 10 qd, to check daily wts and f/u 7-10 days with PCP for exam and probable lab including K

## 2017-04-30 ENCOUNTER — Ambulatory Visit: Payer: BLUE CROSS/BLUE SHIELD | Admitting: Internal Medicine

## 2017-05-23 ENCOUNTER — Telehealth: Payer: Self-pay | Admitting: *Deleted

## 2017-05-23 NOTE — Telephone Encounter (Signed)
[facility] (Key: [id number]) - [id number] Tradjenta 5MG  OR TABS

## 2017-05-26 NOTE — Telephone Encounter (Signed)
PA was denied, please advise.

## 2017-05-26 NOTE — Telephone Encounter (Signed)
Is he still seeing Dr Carlis Abbott?  If so he would be the best to try to get this approved - if he is not still seeing him we can consider a different medication in the same family that may be covered (onglyza)

## 2017-05-28 NOTE — Telephone Encounter (Signed)
LVM advising that PA was denied and to contact our office if pt is no longer seeing Dr Carlis Abbott and we can send in a different medication in the same family as trajenta.

## 2017-05-29 ENCOUNTER — Telehealth: Payer: Self-pay | Admitting: Internal Medicine

## 2017-05-29 MED ORDER — SAXAGLIPTIN HCL 5 MG PO TABS: 5.0000 mg | ORAL_TABLET | Freq: Every day | ORAL | 1 refills | Status: DC

## 2017-05-29 NOTE — Telephone Encounter (Signed)
Alternative sent

## 2017-05-29 NOTE — Telephone Encounter (Signed)
Copied from [place] [id number]. Topic: General - Other >> May 29, 2017  8:21 AM Lolita Rieger, RMA wrote: Reason for CRM: pt is no longer seeing Dr. Carlis Abbott and would like another medication sent in in the same family as trajenta [phone] ext [phone] is the best number to reach pt during the day

## 2017-05-30 NOTE — Telephone Encounter (Signed)
LVm informing pt.  

## 2017-07-05 IMAGING — DX DG ABDOMEN 2V
5 series · 5 of 5 positions shown · non-contrast
Comparison: None.

CLINICAL DATA: Abdominal distension, history of colon carcinoma

EXAM:
ABDOMEN - 2 VIEW

[abdomen erect]
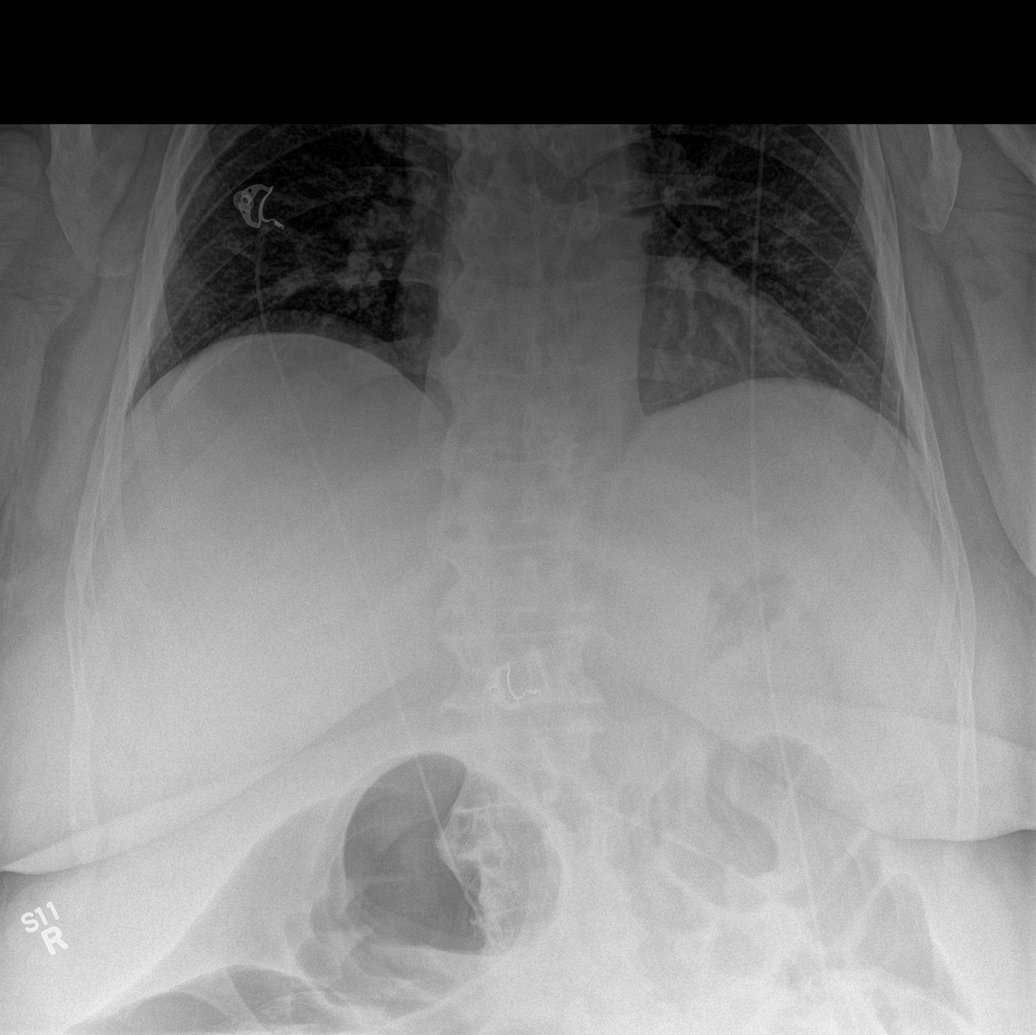

[abdomen supine (1 of 3)]
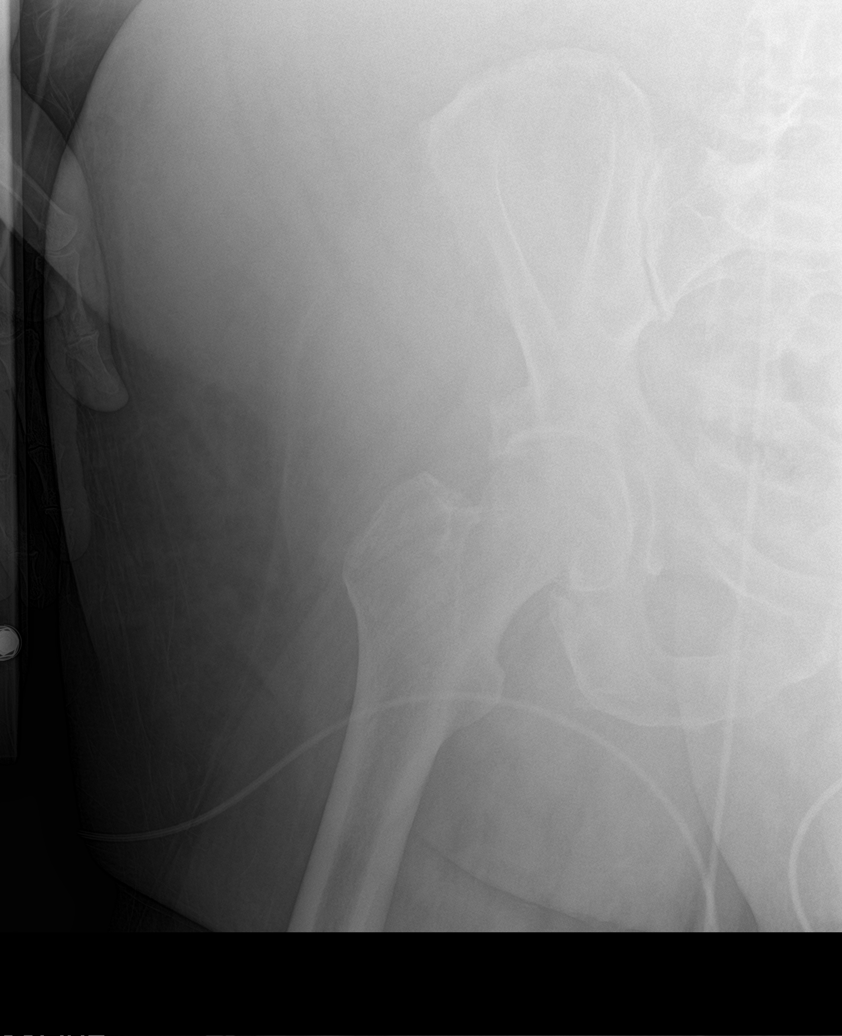

[abdomen supine (2 of 3)]
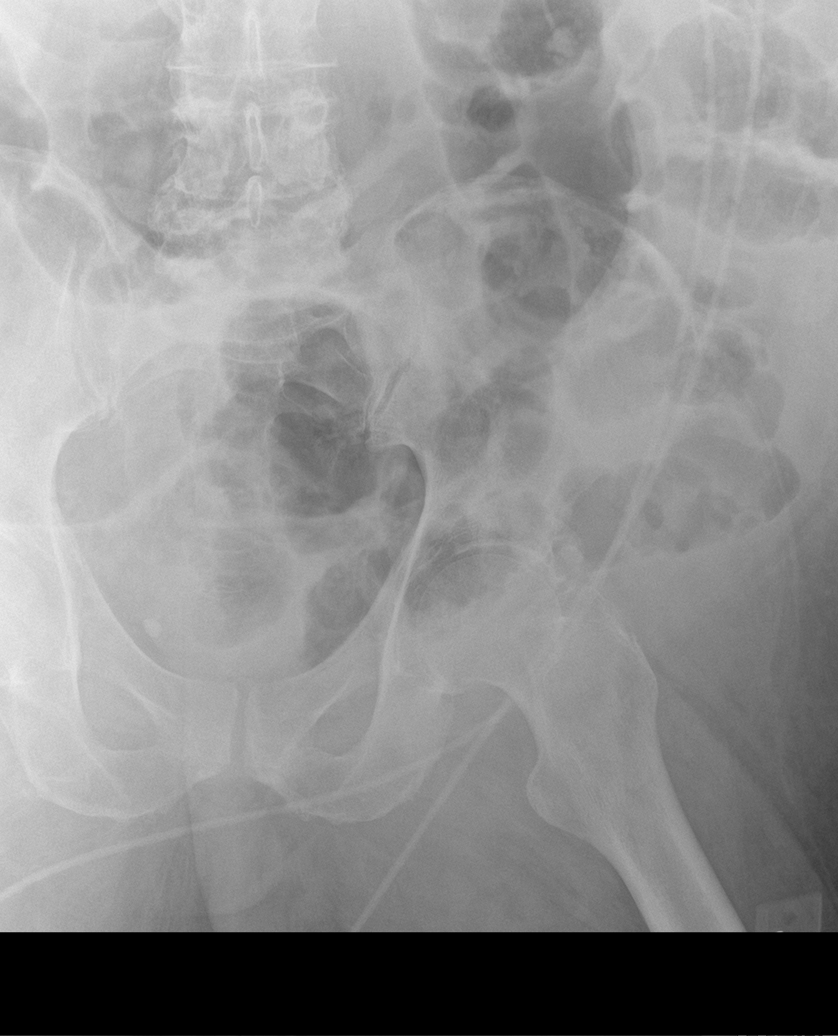

[abdomen decu]
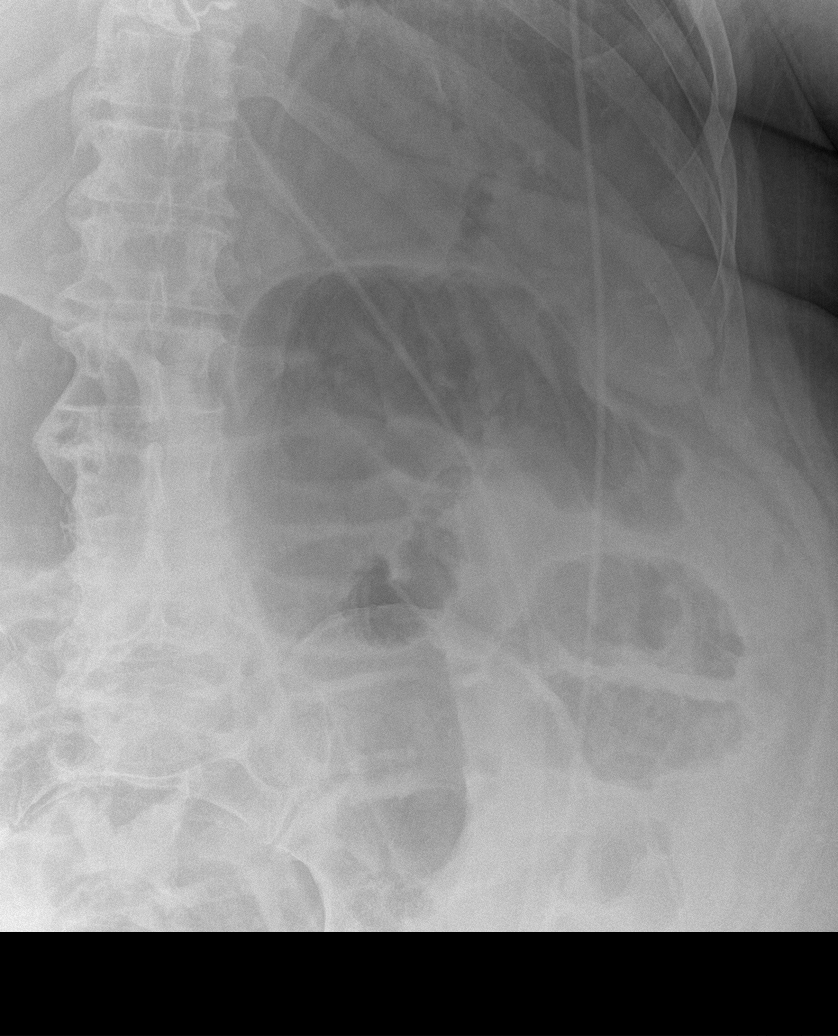

[abdomen supine (3 of 3)]
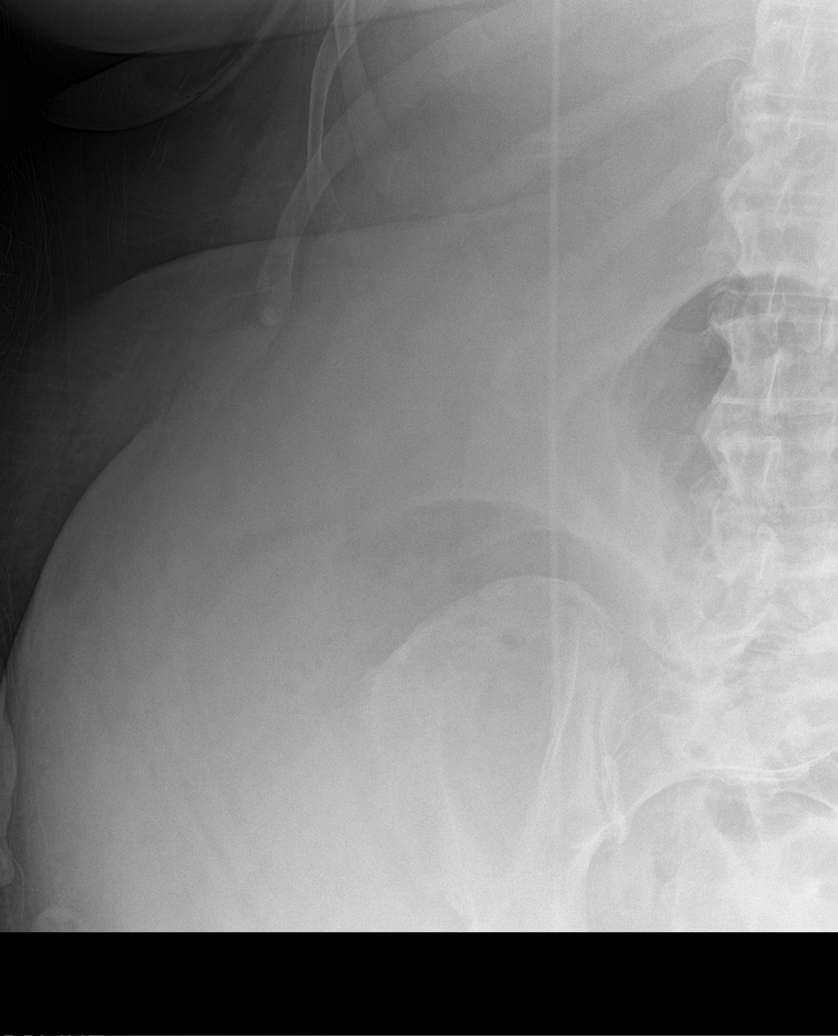

[5 of 5 positions shown; findings below may reference images not displayed]

FINDINGS: Scattered large and small bowel gas is noted. No definitive free air
is seen. The transverse colon is mildly distended with air although
no definitive obstructive changes are seen. Degenerative changes of
lumbar spine are seen. The visualized lung fields are clear. No
other focal abnormality is noted.
IMPRESSION: Mild distension of the colon with gas although no true obstructive
changes seen.

## 2017-07-27 NOTE — Progress Notes (Signed)
Subjective:    Patient ID: Brandon Schaefer, male    DOB: 1955/06/11, 62 y.o.   MRN: [id number]  HPI The patient is here for follow up.  Yesterday he woke up with chills.  He feels tired.  He has bad teeth and started having pain in his tooth yesterday - he thinks he has a dental infection.  Once he is able he needs to have the few remaining teeth that he has pulled completely.  He has redness in his right upper eye lid.  That started yesterday as well.  He denies any eye pain or discharge from the eye.  Diabetes: He was following with endocrine, but his doctor retired.  His sugars have been well controlled and his endocrinologist thought he could follow with me only.  He is taking his medication daily as prescribed. He is compliant with a diabetic diet. He is exercising minimally - walking. He monitors his sugars and they have been running 126-170 in morning. He is up-to-date with podiatry.  He is up-to-date with an ophthalmology examination.   Hypertension: He is taking his medication daily. He is compliant with a low sodium diet.  He denies chest pain, palpitations, edema, shortness of breath and regular headaches. He is exercising minimally-doing some walking.  He does not monitor his blood pressure at home.    B/l leg edema:  He takes lasix daily.   He is compliant with a low sodium diet.  His edema is well controlled.    Hyperlipidemia: He is taking his medication daily. He is compliant with a low fat/cholesterol diet. He is exercising some. He denies myalgias.   Anxiety: He is taking his medication daily as prescribed. He denies any side effects from the medication. He feels his anxiety is well controlled and he is happy with his current dose of medication.   OA of knees;  He takes meloxciam daily.  He does feel that this helps with his pain.  Medications and allergies reviewed with patient and updated if appropriate.  Patient Active Problem List   Diagnosis Date Noted  . Dental  infection 07/28/2017  . Cellulitis of foot 12/06/2015  . Sepsis due to cellulitis ([place]) 11/26/2015  . Right medial malleolar fracture 11/26/2015  . Anxiety 11/26/2015  . Hyperlipidemia 11/26/2015  . Bilateral leg edema 08/22/2015  . Back pain 03/20/2015  . Anemia, iron deficiency 05/05/2012  . DM (diabetes mellitus) ([place]) 01/18/2012  . HTN (hypertension) 01/18/2012  . Morbid obesity with body mass index of 60.0-69.9 in adult ([place]) 01/17/2012  . Cancer of sigmoid colon ([place]) 12/23/2011  . Arthritis of knee, degenerative 08/09/2011  . Peripheral nerve disease 01/01/2006    Current Outpatient Medications on File Prior to Visit  Medication Sig Dispense Refill  . aspirin EC 81 MG tablet Take 81 mg by mouth daily.    . carvedilol (COREG) 6.25 MG tablet Take 1 tablet (6.25 mg total) by mouth 2 (two) times daily with a meal. 180 tablet 3  . CONTOUR NEXT TEST test strip 3 (three) times daily. as directed  2  . furosemide (LASIX) 40 MG tablet Take 1 tablet (40 mg total) by mouth 2 (two) times daily. 180 tablet 3  . meloxicam (MOBIC) 15 MG tablet TAKE 1 TABLET(15 MG) BY MOUTH DAILY 90 tablet 3  . metFORMIN (GLUCOPHAGE) 1000 MG tablet Take 1,000 mg by mouth 2 (two) times daily with a meal.     . MOVANTIK 25 MG TABS tablet     .  NARCAN 4 MG/0.1ML LIQD nasal spray kit     . potassium chloride (K-DUR) 10 MEQ tablet Take 1 tablet (10 mEq total) by mouth daily. 90 tablet 3  . ramipril (ALTACE) 10 MG capsule Take 1 capsule (10 mg total) by mouth daily before breakfast. 90 capsule 1  . rosuvastatin (CRESTOR) 20 MG tablet Take 1 tablet (20 mg total) by mouth daily before breakfast. 90 tablet 3  . saxagliptin HCl (ONGLYZA) 5 MG TABS tablet Take 1 tablet (5 mg total) by mouth daily. 90 tablet 1  . senna-docusate (SENOKOT-S) 8.6-50 MG per tablet Take 1 tablet by mouth 2 (two) times daily. While taking pain meds to prevent constipation (Patient taking differently: Take 1 tablet by mouth 2 (two) times daily as  needed for moderate constipation. While taking pain meds to prevent constipation) 30 tablet 0  . sertraline (ZOLOFT) 100 MG tablet TAKE 1 AND 1/2 TABLETS(150 MG) BY MOUTH DAILY BEFORE BREAKFAST 135 tablet 0   No current facility-administered medications on file prior to visit.     Past Medical History:  Diagnosis Date  . Allergy   . Anemia   . Anxiety   . colon ca dx'd 11/2011  . Dental abscess 08/03/2015  . Diabetes mellitus   . Headache(784.0)   . History of blood transfusion   . Hyperlipidemia   . Hypertension   . Neuromuscular disorder ([place])    peripheral neuropathy  . Shortness of breath    with exertion    Past Surgical History:  Procedure Laterality Date  . COMPLEX WOUND CLOSURE N/A 01/06/2013   Procedure: EXCISION CHRONIC ABDOMINAL WOUND;  Surgeon: Harl Bowie, MD;  Location: [facility];  Service: General;  Laterality: N/A;  . CYSTOSCOPY W/ URETERAL STENT PLACEMENT Bilateral 01/04/2013   Procedure: CYSTOSCOPY WITH RETROGRADE PYELOGRAM/Right double J URETERAL STENT PLACEMENT;  Surgeon: Alexis Frock, MD;  Location: [facility];  Service: Urology;  Laterality: Bilateral;  . CYSTOSCOPY WITH RETROGRADE PYELOGRAM, URETEROSCOPY AND STENT PLACEMENT Right 01/06/2013   Procedure: CYSTOSCOPY WITH RIGHT RETROGRADE PYELOGRAM, URETEROSCOPY AND BILATERAL STENT EXCHANGE, BILATERAL STONE BASKET EXTRACTION ;  Surgeon: Alexis Frock, MD;  Location: [facility];  Service: Urology;  Laterality: Right;  . FRACTURE SURGERY      ORIF-left radius has pin  . HOLMIUM LASER APPLICATION N/A 8/34/1962   Procedure: HOLMIUM LASER APPLICATION;  Surgeon: Alexis Frock, MD;  Location: [facility];  Service: Urology;  Laterality: N/A;  . NEPHROLITHOTOMY Left 01/04/2013   Procedure: NEPHROLITHOTOMY PERCUTANEOUS  LEFT 1ST STAGE PERCUTANEOUS NEPHROSTOLITHOTOMY;  Surgeon: Alexis Frock, MD;  Location: [facility];  Service: Urology;  Laterality: Left;  . NEPHROLITHOTOMY Left 01/06/2013   Procedure: NEPHROLITHOTOMY PERCUTANEOUS  SECOND LOOK;  Surgeon: Alexis Frock, MD;  Location: [facility];  Service: Urology;  Laterality: Left;  . PARTIAL COLECTOMY  01/17/2012   Procedure: PARTIAL COLECTOMY;  Surgeon: Harl Bowie, MD;  Location: [facility];  Service: General;;  . PILONIDAL CYST EXCISION    . PORT-A-CATH REMOVAL Left 01/06/2013   Procedure: REMOVAL PORT-A-CATH;  Surgeon: Harl Bowie, MD;  Location: [facility];  Service: General;  Laterality: Left;  . PORTACATH PLACEMENT  03/19/2012   Procedure: INSERTION PORT-A-CATH;  Surgeon: Harl Bowie, MD;  Location: [facility];  Service: General;  Laterality: N/A;  . PROCTOSCOPY  01/17/2012   Procedure: PROCTOSCOPY;  Surgeon: Harl Bowie, MD;  Location: [facility];  Service: General;;    Social History   Socioeconomic History  . Marital status: Single  Spouse name: Not on file  . Number of children: Not on file  . Years of education: Not on file  . Highest education level: Not on file  Occupational History  . Not on file  Social Needs  . Financial resource strain: Not on file  . Food insecurity:    Worry: Not on file    Inability: Not on file  . Transportation needs:    Medical: Not on file    Non-medical: Not on file  Tobacco Use  . Smoking status: Never Smoker  . Smokeless tobacco: Never Used  . Tobacco comment: NEVER USED [id number]  Substance and Sexual Activity  . Alcohol use: No    Alcohol/week: 0.0 oz  . Drug use: No  . Sexual activity: Not on file  Lifestyle  . Physical activity:    Days per week: Not on file    Minutes per session: Not on file  . Stress: Not on file  Relationships  . Social connections:    Talks on phone: Not on file    Gets together: Not on file    Attends religious service: Not on file    Active member of club or organization: Not on file    Attends meetings of clubs or organizations: Not on file    Relationship status: Not on file  Other Topics Concern  . Not on file  Social History Narrative  .  Not on file    Family History  Problem Relation Age of Onset  . Diabetes Father   . Cancer Maternal Aunt        colon  . Cancer Maternal Grandmother        colon    Review of Systems  Constitutional: Positive for chills and fever.  HENT: Positive for dental problem.   Respiratory: Negative for cough, shortness of breath and wheezing.   Cardiovascular: Negative for chest pain, palpitations and leg swelling.  Neurological: Positive for light-headedness and headaches.       Objective:   Vitals:   07/28/17 0857  BP: 134/80  Pulse: (!) 105  Resp: 18  Temp: 99 F (37.2 C)  SpO2: 95%   BP Readings from Last 3 Encounters:  07/28/17 134/80  04/22/17 128/82  01/27/17 138/80   Wt Readings from Last 3 Encounters:  07/28/17 (!) 356 lb (161.5 kg)  04/22/17 (!) 357 lb (161.9 kg)  01/27/17 (!) 334 lb (151.5 kg)   Body mass index is 48.28 kg/m.   Physical Exam    Constitutional: Appears well-developed and well-nourished. No distress.  HENT:  Head: Normocephalic and atraumatic.  Neck: Neck supple. No tracheal deviation present. No thyromegaly present.  No cervical lymphadenopathy Mouth: Poor dentition, swollen red gums, several teeth missing, no obvious discharge Cardiovascular: Normal rate, regular rhythm and normal heart sounds.   No murmur heard. No carotid bruit .  1+ bilateral lower extremity nonpitting edema Pulmonary/Chest: Effort normal and breath sounds normal. No respiratory distress. No has no wheezes. No rales.  Skin: Skin is warm and dry. Not diaphoretic.  Psychiatric: Normal mood and affect. Behavior is normal.      Assessment & Plan:    See Problem List for Assessment and Plan of chronic medical problems.

## 2017-07-27 NOTE — Patient Instructions (Addendum)
  Test(s) ordered today. Your results will be released to [facility] (or called to you) after review, usually within 72hours after test completion. If any changes need to be made, you will be notified at that same time.  All other Health Maintenance issues reviewed.   All recommended immunizations and age-appropriate screenings are up-to-date or discussed.  No immunizations administered today.   Medications reviewed and updated.  Changes include taking an antibiotic for your tooth infection.  Your prescription(s) have been submitted to your pharmacy. Please take as directed and contact our office if you believe you are having problem(s) with the medication(s).   Please followup in 3 months

## 2017-07-28 ENCOUNTER — Ambulatory Visit: Payer: BLUE CROSS/BLUE SHIELD | Admitting: Internal Medicine

## 2017-07-28 ENCOUNTER — Encounter: Payer: Self-pay | Admitting: Internal Medicine

## 2017-07-28 ENCOUNTER — Other Ambulatory Visit (INDEPENDENT_AMBULATORY_CARE_PROVIDER_SITE_OTHER): Payer: BLUE CROSS/BLUE SHIELD

## 2017-07-28 VITALS — BP 134/80 | HR 105 | Temp 99.0°F | Resp 18 | Ht 72.0 in | Wt 356.0 lb

## 2017-07-28 DIAGNOSIS — E119 Type 2 diabetes mellitus without complications: Secondary | ICD-10-CM

## 2017-07-28 DIAGNOSIS — E7849 Other hyperlipidemia: Secondary | ICD-10-CM

## 2017-07-28 DIAGNOSIS — K047 Periapical abscess without sinus: Secondary | ICD-10-CM | POA: Diagnosis not present

## 2017-07-28 DIAGNOSIS — R600 Localized edema: Secondary | ICD-10-CM | POA: Diagnosis not present

## 2017-07-28 DIAGNOSIS — Z794 Long term (current) use of insulin: Secondary | ICD-10-CM

## 2017-07-28 DIAGNOSIS — H00011 Hordeolum externum right upper eyelid: Secondary | ICD-10-CM | POA: Insufficient documentation

## 2017-07-28 DIAGNOSIS — M170 Bilateral primary osteoarthritis of knee: Secondary | ICD-10-CM

## 2017-07-28 DIAGNOSIS — F419 Anxiety disorder, unspecified: Secondary | ICD-10-CM

## 2017-07-28 DIAGNOSIS — I10 Essential (primary) hypertension: Secondary | ICD-10-CM

## 2017-07-28 LAB — CBC WITH DIFFERENTIAL/PLATELET
Basophils Absolute: 0 10*3/uL (ref 0.0–0.1)
Basophils Relative: 0.6 % (ref 0.0–3.0)
Eosinophils Absolute: 0.1 10*3/uL (ref 0.0–0.7)
Eosinophils Relative: 1.7 % (ref 0.0–5.0)
HCT: 42.4 % (ref 39.0–52.0)
Hemoglobin: 14.7 g/dL (ref 13.0–17.0)
Lymphocytes Relative: 22.1 % (ref 12.0–46.0)
Lymphs Abs: 1.5 10*3/uL (ref 0.7–4.0)
MCHC: 34.8 g/dL (ref 30.0–36.0)
MCV: 82.7 fl (ref 78.0–100.0)
Monocytes Absolute: 0.5 10*3/uL (ref 0.1–1.0)
Monocytes Relative: 8.2 % (ref 3.0–12.0)
Neutro Abs: 4.4 10*3/uL (ref 1.4–7.7)
Neutrophils Relative %: 67.4 % (ref 43.0–77.0)
Platelets: 212 10*3/uL (ref 150.0–400.0)
RBC: 5.13 Mil/uL (ref 4.22–5.81)
RDW: 14.2 % (ref 11.5–15.5)
WBC: 6.6 10*3/uL (ref 4.0–10.5)

## 2017-07-28 LAB — COMPREHENSIVE METABOLIC PANEL
ALT: 21 U/L (ref 0–53)
AST: 19 U/L (ref 0–37)
Albumin: 3.9 g/dL (ref 3.5–5.2)
Alkaline Phosphatase: 69 U/L (ref 39–117)
BUN: 16 mg/dL (ref 6–23)
CALCIUM: 9.1 mg/dL (ref 8.4–10.5)
CHLORIDE: 102 meq/L (ref 96–112)
CO2: 24 meq/L (ref 19–32)
CREATININE: 0.9 mg/dL (ref 0.40–1.50)
GFR: 90.44 mL/min (ref >60.00)
GLUCOSE: 218 mg/dL — AB (ref 70–99)
Potassium: 4.5 mEq/L (ref 3.5–5.1)
SODIUM: 137 meq/L (ref 135–145)
Total Bilirubin: 0.7 mg/dL (ref 0.2–1.2)
Total Protein: 6.7 g/dL (ref 6.0–8.3)

## 2017-07-28 LAB — LIPID PANEL
Cholesterol: 103 mg/dL (ref 0–200)
HDL: 35.2 mg/dL — ABNORMAL LOW
LDL Cholesterol: 41 mg/dL (ref 0–99)
NonHDL: 67.74
Total CHOL/HDL Ratio: 3
Triglycerides: 132 mg/dL (ref 0.0–149.0)
VLDL: 26.4 mg/dL (ref 0.0–40.0)

## 2017-07-28 LAB — HEMOGLOBIN A1C: HEMOGLOBIN A1C: 7.6 % — AB (ref 4.6–6.5)

## 2017-07-28 LAB — TSH: TSH: 3.36 u[IU]/mL (ref 0.35–4.50)

## 2017-07-28 MED ORDER — AMOXICILLIN-POT CLAVULANATE 875-125 MG PO TABS
1.0000 | ORAL_TABLET | Freq: Two times a day (BID) | ORAL | 0 refills | Status: DC
Start: 2017-07-28 — End: 2017-10-26

## 2017-07-28 MED ORDER — INSULIN LISPRO PROT & LISPRO (75-25 MIX) 100 UNIT/ML ~~LOC~~ SUSP: SUBCUTANEOUS | 3 refills | Status: DC

## 2017-07-29 ENCOUNTER — Telehealth: Payer: Self-pay | Admitting: Internal Medicine

## 2017-07-29 ENCOUNTER — Encounter: Payer: Self-pay | Admitting: Internal Medicine

## 2017-07-29 NOTE — Telephone Encounter (Signed)
Copied from [place] [id number]. Topic: Quick Communication - Rx Refill/Question >> Jul 29, 2017 11:47 AM Robina Ade, Helene Kelp D wrote: Medication: insulin lispro protamine-lispro (HUMALOG MIX 75/25) (75-25) 100 UNIT/ML SUSP injection Has the patient contacted their pharmacy?Yes, but it needs prior authorization from office/insurance. (Agent: If no, request that the patient contact the pharmacy for the refill.) Preferred Pharmacy (with phone number or street name):[id number] - [place], [place] Agent: Please be advised that RX refills may take up to 3 business days. We ask that you follow-up with your pharmacy.

## 2017-07-29 NOTE — Assessment & Plan Note (Signed)
Check lipid panel, TSH, CMP Continue daily statin Regular exercise and healthy diet encouraged  

## 2017-07-29 NOTE — Assessment & Plan Note (Signed)
Controlled, stable Continue current medication  

## 2017-07-29 NOTE — Telephone Encounter (Signed)
This has already been taken care of. Pt states he did buy a bottle of insulin at pharmacy so he would not go without medication.

## 2017-07-29 NOTE — Assessment & Plan Note (Signed)
Overall controlled Continue Lasix daily CMP

## 2017-07-29 NOTE — Telephone Encounter (Signed)
Caller name:  Janalee Dane from [place]   Call back number: [phone] option [phone] fax # [phone]   Reason for call:   [employer] called with the patient online stating patient was informed that PA was initiated,  [employer] has no record of this and will be faxing over PA form to [phone]. Patient would like to know the status when started, please advise.

## 2017-07-29 NOTE — Assessment & Plan Note (Signed)
Right upper eyelid Stressed warm compresses Prescribed Augmentin for dental infection, which may help Call if no improvement-advised may need to see ophthalmology

## 2017-07-29 NOTE — Telephone Encounter (Signed)
Patient is calling for prior authorization for Humalog mix 75/25

## 2017-07-29 NOTE — Assessment & Plan Note (Signed)
BP well controlled Current regimen effective and well tolerated Continue current medications at current doses cmp, TSH, CBC 

## 2017-07-29 NOTE — Assessment & Plan Note (Addendum)
Sugars have been well controlled at home Check A1c today I will manage his sugars since his endocrinologist retired Continue current regimen-we will adjust if needed Eye exams and podiatry exams up-to-date-we will try to get reports Follow-up in 3 months

## 2017-07-29 NOTE — Assessment & Plan Note (Signed)
He has very poor dentition and yesterday started having chills and tooth pain Likely dental infection Gums appear swollen-no obvious discharge Augmentin twice daily times 10 days He understands he needs to see a dentist and that will be the only cure ultimately

## 2017-07-29 NOTE — Telephone Encounter (Signed)
Copied from [place] [id number]. Topic: Quick Communication - Rx Refill/Question >> Jul 29, 2017 11:47 AM Robina Ade, Helene Kelp D wrote: Medication: insulin lispro protamine-lispro (HUMALOG MIX 75/25) (75-25) 100 UNIT/ML SUSP injection Has the patient contacted their pharmacy?Yes, but it needs prior authorization from office/insurance. (Agent: If no, request that the patient contact the pharmacy for the refill.) Preferred Pharmacy (with phone number or street name):[id number] - [place], [place] Agent: Please be advised that RX refills may take up to 3 business days. We ask that you follow-up with your pharmacy. >> Jul 29, 2017 12:42 PM Cleaster Corin, NT wrote: Burnetta Sabin calling from [employer] calling to see if pt. Can come in for an appt. To receive insulin pt. Is completely out and needed med. Also needing an update on the prior authorization is needed. Eugenie Filler can be reached at [phone]

## 2017-07-29 NOTE — Telephone Encounter (Signed)
PA has been completed, awaiting response. Advised pt that I would contact [facility] when we had a response on the PA. We do not have any samples in the office.

## 2017-07-29 NOTE — Assessment & Plan Note (Signed)
Chronic knee arthritis Taking meloxicam daily, which is effective We will continue CMP, CBC

## 2017-07-30 NOTE — Telephone Encounter (Signed)
Form has been completed and faxed back.

## 2017-07-30 NOTE — Telephone Encounter (Signed)
Brandon Schaefer from [place] and is needing a STAT Prior auth on a insulin lispro protamine-lispro (HUMALOG MIX 75/25) (75-25) 100 UNIT/ML SUSP injection. States that the form that was received yesterday (07/29/17) was the incorrect form. States the correct for was faxed last night around 7:00pm. Will need this correct for refaxed. FAX: [phone] CB#: [phone]

## 2017-07-30 NOTE — Telephone Encounter (Signed)
[employer] called and states the PA for the medication has been approved

## 2017-07-31 NOTE — Telephone Encounter (Signed)
Spoke with Pharmacist at [facility] to inform PA was approved.

## 2017-08-26 ENCOUNTER — Other Ambulatory Visit: Payer: Self-pay | Admitting: Internal Medicine

## 2017-09-20 ENCOUNTER — Other Ambulatory Visit: Payer: Self-pay | Admitting: Internal Medicine

## 2017-10-09 ENCOUNTER — Inpatient Hospital Stay: Payer: BLUE CROSS/BLUE SHIELD | Admitting: Hematology & Oncology

## 2017-10-09 ENCOUNTER — Other Ambulatory Visit: Payer: Self-pay

## 2017-10-09 ENCOUNTER — Inpatient Hospital Stay: Payer: BLUE CROSS/BLUE SHIELD | Attending: Hematology & Oncology

## 2017-10-09 ENCOUNTER — Encounter: Payer: Self-pay | Admitting: Hematology & Oncology

## 2017-10-09 VITALS — BP 158/77 | HR 88 | Temp 98.3°F | Resp 20 | Wt 351.0 lb

## 2017-10-09 DIAGNOSIS — D500 Iron deficiency anemia secondary to blood loss (chronic): Secondary | ICD-10-CM

## 2017-10-09 DIAGNOSIS — C187 Malignant neoplasm of sigmoid colon: Secondary | ICD-10-CM

## 2017-10-09 DIAGNOSIS — Z85038 Personal history of other malignant neoplasm of large intestine: Secondary | ICD-10-CM | POA: Insufficient documentation

## 2017-10-09 LAB — CMP (CANCER CENTER ONLY)
ALBUMIN: 3.8 g/dL (ref 3.5–5.0)
ALT: 22 U/L (ref 0–44)
AST: 17 U/L (ref 15–41)
Alkaline Phosphatase: 91 U/L (ref 38–126)
Anion gap: 7 (ref 5–15)
BILIRUBIN TOTAL: 0.5 mg/dL (ref 0.3–1.2)
BUN: 22 mg/dL (ref 8–23)
CHLORIDE: 102 mmol/L (ref 98–111)
CO2: 28 mmol/L (ref 22–32)
Calcium: 9.4 mg/dL (ref 8.9–10.3)
Creatinine: 1.11 mg/dL (ref 0.61–1.24)
GFR, Est AFR Am: >60 mL/min (ref >60)
GFR, Estimated: >60 mL/min (ref >60)
GLUCOSE: 301 mg/dL — AB (ref 70–99)
POTASSIUM: 5 mmol/L (ref 3.5–5.1)
SODIUM: 137 mmol/L (ref 135–145)
TOTAL PROTEIN: 6.8 g/dL (ref 6.5–8.1)

## 2017-10-09 LAB — CBC WITH DIFFERENTIAL (CANCER CENTER ONLY)
BASOS PCT: 0 %
Basophils Absolute: 0 10*3/uL (ref 0.0–0.1)
Eosinophils Absolute: 0.3 10*3/uL (ref 0.0–0.5)
Eosinophils Relative: 4 %
HEMATOCRIT: 40.2 % (ref 38.7–49.9)
Hemoglobin: 13.9 g/dL (ref 13.0–17.1)
Lymphocytes Relative: 21 %
Lymphs Abs: 1.7 10*3/uL (ref 0.9–3.3)
MCH: 28.7 pg (ref 28.0–33.4)
MCHC: 34.6 g/dL (ref 32.0–35.9)
MCV: 82.9 fL (ref 82.0–98.0)
Monocytes Absolute: 0.6 10*3/uL (ref 0.1–0.9)
Monocytes Relative: 7 %
NEUTROS ABS: 5.6 10*3/uL (ref 1.5–6.5)
Neutrophils Relative %: 68 %
Platelet Count: 249 10*3/uL (ref 145–400)
RBC: 4.85 MIL/uL (ref 4.20–5.70)
RDW: 13.2 % (ref 11.1–15.7)
WBC Count: 8.2 10*3/uL (ref 4.0–10.0)

## 2017-10-09 LAB — CEA (IN HOUSE-CHCC): CEA (CHCC-In House): 1.84 ng/mL (ref 0.00–5.00)

## 2017-10-09 LAB — LACTATE DEHYDROGENASE: LDH: 170 U/L (ref 98–192)

## 2017-10-09 NOTE — Progress Notes (Signed)
Hematology and Oncology Follow Up Visit  Brandon Schaefer [id number] 06-21-1955 62 y.o. 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 Brandon Schaefer is here today for a follow-up. He looks quite good.  He is lost about 120 pounds since we first started seeing him.  He is still working.  He enjoys his job.  He has had no issues with the diabetes.  He says that his hemoglobin A1c is in the 6 range.  He has had no fever.  He has had no bleeding.  He has had no change in bowel or bladder habits.  He has had no cough or shortness of breath.  He has had no nausea or vomiting.  There has been no rash.  Overall, his performance status is ECOG 2.  Medications:  Allergies as of 10/09/2017      Reactions   Adhesive [tape] Rash      Medication List        Accurate as of 10/09/17  8:14 AM. Always use your most recent med list.          amoxicillin-clavulanate 875-125 MG tablet Commonly known as:  AUGMENTIN Take 1 tablet by mouth 2 (two) times daily.   aspirin EC 81 MG tablet Take 81 mg by mouth daily.   carvedilol 6.25 MG tablet Commonly known as:  COREG Take 1 tablet (6.25 mg total) by mouth 2 (two) times daily with a meal.   CONTOUR NEXT TEST test strip Generic drug:  glucose blood 3 (three) times daily. as directed   furosemide 40 MG tablet Commonly known as:  LASIX Take 1 tablet (40 mg total) by mouth 2 (two) times daily.   insulin lispro protamine-lispro (75-25) 100 UNIT/ML Susp injection Commonly known as:  HUMALOG MIX 75/25 30 units in the am and 20 units at night   meloxicam 15 MG tablet Commonly known as:  MOBIC TAKE 1 TABLET(15 MG) BY MOUTH DAILY   metFORMIN 1000 MG tablet Commonly known as:  GLUCOPHAGE Take 1,000 mg by mouth 2 (two) times daily with a meal.   morphine 30 MG 12 hr tablet Commonly  known as:  MS CONTIN Take 30 mg by mouth 2 (two) times daily as needed.   MOVANTIK 25 MG Tabs tablet Generic drug:  naloxegol oxalate   NARCAN 4 MG/0.1ML Liqd nasal spray kit Generic drug:  naloxone   Oxycodone HCl 10 MG Tabs Take 10 mg by mouth.   potassium chloride 10 MEQ tablet Commonly known as:  K-DUR Take 1 tablet (10 mEq total) by mouth daily.   ramipril 10 MG capsule Commonly known as:  ALTACE TAKE 1 CAPSULE(10 MG) BY MOUTH DAILY BEFORE BREAKFAST   rosuvastatin 20 MG tablet Commonly known as:  CRESTOR Take 1 tablet (20 mg total) by mouth daily before breakfast.   saxagliptin HCl 5 MG Tabs tablet Commonly known as:  ONGLYZA Take 1 tablet (5 mg total) by mouth daily.   senna-docusate 8.6-50 MG tablet Commonly known as:  Senokot-S Take 1 tablet by mouth 2 (two) times daily. While taking pain meds to prevent constipation   sertraline 100 MG tablet Commonly known as:  ZOLOFT TAKE 1 AND 1/2 TABLETS(150 MG) BY MOUTH DAILY BEFORE BREAKFAST       Allergies:  Allergies  Allergen Reactions  . Adhesive [Tape] Rash    Past Medical History,  Surgical history, Social history, and Family History were reviewed and updated.  Review of Systems: As stated in the interim history   Physical Exam:  weight is 351 lb (159.2 kg) (abnormal). His oral temperature is 98.3 F (36.8 C). His blood pressure is 158/77 (abnormal) and his pulse is 88. His respiration is 20 and oxygen saturation is 95%.   Wt Readings from Last 3 Encounters:  10/09/17 (!) 351 lb (159.2 kg)  07/28/17 (!) 356 lb (161.5 kg)  04/22/17 (!) 357 lb (161.9 kg)    Morbidly obese white male. Head and neck exam shows no ocular or oral lesions. There are no palpable cervical or supraclavicular lymph nodes. Lungs are clear. Cardiac exam regular rate and rhythm with no murmurs, rubs or bruits. Abdomen is soft. He has well-healed laparotomy scar. He has a slight anterior abdominal wall hernia. There is no fluid wave.  There is no guarding or rebound tenderness. There is no palpable liver or spleen tip. Extremities shows no clubbing, cyanosis or edema. He may have some slight stasis dermatitis changes. Neurological exam shows no focal neurological deficits.  Lab Results  Component Value Date   WBC 8.2 10/09/2017   HGB 13.9 10/09/2017   HCT 40.2 10/09/2017   MCV 82.9 10/09/2017   PLT 249 10/09/2017   Lab Results  Component Value Date   FERRITIN 85 06/16/2014   IRON 58 06/16/2014   TIBC 336 06/16/2014   UIBC 279 06/16/2014   IRONPCTSAT 17 (L) 06/16/2014   Lab Results  Component Value Date   RBC 4.85 10/09/2017   No results found for: KPAFRELGTCHN, LAMBDASER, KAPLAMBRATIO No results found for: IGGSERUM, IGA, IGMSERUM No results found for: Odetta Pink, SPEI   Chemistry      Component Value Date/Time   NA 137 07/28/2017 0937   NA 141 01/09/2017 0843   K 4.5 07/28/2017 0937   K 4.4 01/09/2017 0843   CL 102 07/28/2017 0937   CL 98 12/22/2014 0751   CO2 24 07/28/2017 0937   CO2 26 01/09/2017 0843   BUN 16 07/28/2017 0937   BUN 16.0 01/09/2017 0843   CREATININE 0.90 07/28/2017 0937   CREATININE 1.0 01/09/2017 0843      Component Value Date/Time   CALCIUM 9.1 07/28/2017 0937   CALCIUM 9.5 01/09/2017 0843   ALKPHOS 69 07/28/2017 0937   ALKPHOS 83 01/09/2017 0843   AST 19 07/28/2017 0937   AST 14 01/09/2017 0843   ALT 21 07/28/2017 0937   ALT 14 01/09/2017 0843   BILITOT 0.7 07/28/2017 0937   BILITOT 0.52 01/09/2017 0843     Impression and Plan: Mr. Brandon Schaefer is 62 yo gentleman with a history of a stage II colon cancer. He completed chemotherapy in June 2014. He was felt to be at high risk for recurrence due to microsatellite stability.   This is now been 5 years since treatment, we can let him go from the practice.  I do still think that his cancer is Schaefer to come back.  I think that he has other health issues that probably will be  more important for him.    I am just very proud of him.  He went through a whole lot.  He has had a lot of issues.  However, he has persevered and his faith has been very strong.   [name], MD 6/27/20198:14 AM

## 2017-10-26 NOTE — Progress Notes (Signed)
Subjective:    Patient ID: Brandon Schaefer, male    DOB: Dec 29, 1955, 62 y.o.   MRN: [id number]  HPI The patient is here for follow up.  Diabetes: He is taking his medication daily as prescribed. He is compliant with a diabetic diet. He is exercising -walking. He monitors his sugars and they have been running mostly around 120, occasionally 200's. He checks his feet daily and denies foot lesions. He sees podiatry.  He is up-to-date with an ophthalmology examination.   Hypertension: He is taking his medication daily. He is compliant with a low sodium diet.  He denies chest pain, palpitations, edema, shortness of breath and regular headaches. He is walking regularly.  He does monitor his blood pressure at home - 120-130/70's.    Hyperlipidemia: He is taking his medication daily. He is compliant with a low fat/cholesterol diet. He is exercising regularly. He denies myalgias.   Anxiety: He is taking his medication daily as prescribed. He denies any side effects from the medication. He feels his anxiety is well controlled and he is happy with his current dose of medication.   B/l leg edema:  He takes lasix daily.  He swelling in controlled.   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  He is unsure if it is helping, but is afraid to stop it.    He hit is right anterior leg three days ago  It itches, but no pain. He hit it just at the site of his previous cellulitis.  Medications and allergies reviewed with patient and updated if appropriate.  Patient Active Problem List   Diagnosis Date Noted  . Dental infection 07/28/2017  . Hordeolum externum of right upper eyelid 07/28/2017  . Cellulitis of foot 12/06/2015  . Sepsis due to cellulitis ([place]) 11/26/2015  . Right medial malleolar fracture 11/26/2015  . Anxiety 11/26/2015  . Hyperlipidemia 11/26/2015  . Bilateral leg edema 08/22/2015  . Back pain 03/20/2015  . Anemia, iron deficiency 05/05/2012  . DM (diabetes mellitus) ([place]) 01/18/2012    . HTN (hypertension) 01/18/2012  . Morbid obesity with body mass index of 60.0-69.9 in adult ([place]) 01/17/2012  . Cancer of sigmoid colon ([place]) 12/23/2011  . Arthritis of knee, degenerative 08/09/2011  . Peripheral nerve disease 01/01/2006    Current Outpatient Medications on File Prior to Visit  Medication Sig Dispense Refill  . aspirin EC 81 MG tablet Take 81 mg by mouth daily.    . carvedilol (COREG) 6.25 MG tablet Take 1 tablet (6.25 mg total) by mouth 2 (two) times daily with a meal. 180 tablet 3  . CONTOUR NEXT TEST test strip 3 (three) times daily. as directed  2  . furosemide (LASIX) 40 MG tablet Take 1 tablet (40 mg total) by mouth 2 (two) times daily. 180 tablet 3  . insulin lispro protamine-lispro (HUMALOG MIX 75/25) (75-25) 100 UNIT/ML SUSP injection 30 units in the am and 20 units at night 30 mL 3  . meloxicam (MOBIC) 15 MG tablet TAKE 1 TABLET(15 MG) BY MOUTH DAILY 90 tablet 3  . metFORMIN (GLUCOPHAGE) 1000 MG tablet Take 1,000 mg by mouth 2 (two) times daily with a meal.     . morphine (MS CONTIN) 30 MG 12 hr tablet Take 30 mg by mouth 2 (two) times daily as needed.  0  . MOVANTIK 25 MG TABS tablet     . NARCAN 4 MG/0.1ML LIQD nasal spray kit     . Oxycodone HCl 10 MG TABS  Take 10 mg by mouth.  0  . potassium chloride (K-DUR) 10 MEQ tablet Take 1 tablet (10 mEq total) by mouth daily. 90 tablet 3  . ramipril (ALTACE) 10 MG capsule TAKE 1 CAPSULE(10 MG) BY MOUTH DAILY BEFORE BREAKFAST 90 capsule 1  . rosuvastatin (CRESTOR) 20 MG tablet Take 1 tablet (20 mg total) by mouth daily before breakfast. 90 tablet 3  . saxagliptin HCl (ONGLYZA) 5 MG TABS tablet Take 1 tablet (5 mg total) by mouth daily. 90 tablet 1  . senna-docusate (SENOKOT-S) 8.6-50 MG per tablet Take 1 tablet by mouth 2 (two) times daily. While taking pain meds to prevent constipation (Patient taking differently: Take 1 tablet by mouth 2 (two) times daily as needed for moderate constipation. While taking pain meds to  prevent constipation) 30 tablet 0  . sertraline (ZOLOFT) 100 MG tablet TAKE 1 AND 1/2 TABLETS(150 MG) BY MOUTH DAILY BEFORE BREAKFAST 135 tablet 1   No current facility-administered medications on file prior to visit.     Past Medical History:  Diagnosis Date  . Allergy   . Anemia   . Anxiety   . colon ca dx'd 11/2011  . Dental abscess 08/03/2015  . Diabetes mellitus   . Headache(784.0)   . History of blood transfusion   . Hyperlipidemia   . Hypertension   . Neuromuscular disorder ([place])    peripheral neuropathy  . Shortness of breath    with exertion    Past Surgical History:  Procedure Laterality Date  . COMPLEX WOUND CLOSURE N/A 01/06/2013   Procedure: EXCISION CHRONIC ABDOMINAL WOUND;  Surgeon: Harl Bowie, MD;  [facility]: [facility];  Service: General;  Laterality: N/A;  . CYSTOSCOPY W/ URETERAL STENT PLACEMENT Bilateral 01/04/2013   Procedure: CYSTOSCOPY WITH RETROGRADE PYELOGRAM/Right double J URETERAL STENT PLACEMENT;  Surgeon: Alexis Frock, MD;  [facility];  Service: Urology;  Laterality: Bilateral;  . CYSTOSCOPY WITH RETROGRADE PYELOGRAM, URETEROSCOPY AND STENT PLACEMENT Right 01/06/2013   Procedure: CYSTOSCOPY WITH RIGHT RETROGRADE PYELOGRAM, URETEROSCOPY AND BILATERAL STENT EXCHANGE, BILATERAL STONE BASKET EXTRACTION ;  Surgeon: Alexis Frock, MD;  [facility];  Service: Urology;  Laterality: Right;  . FRACTURE SURGERY      ORIF-left radius has pin  . HOLMIUM LASER APPLICATION N/A 1/61/0960   Procedure: HOLMIUM LASER APPLICATION;  Surgeon: Alexis Frock, MD;  [facility];  Service: Urology;  Laterality: N/A;  . NEPHROLITHOTOMY Left 01/04/2013   Procedure: NEPHROLITHOTOMY PERCUTANEOUS  LEFT 1ST STAGE PERCUTANEOUS NEPHROSTOLITHOTOMY;  Surgeon: Alexis Frock, MD;  [facility];  Service: Urology;  Laterality: Left;  . NEPHROLITHOTOMY Left 01/06/2013   Procedure: NEPHROLITHOTOMY PERCUTANEOUS SECOND LOOK;  Surgeon: Alexis Frock, MD;  [facility];  Service: Urology;  Laterality: Left;  . PARTIAL COLECTOMY  01/17/2012   Procedure: PARTIAL COLECTOMY;  Surgeon: Harl Bowie, MD;  [facility];  Service: General;;  . PILONIDAL CYST EXCISION    . PORT-A-CATH REMOVAL Left 01/06/2013   Procedure: REMOVAL PORT-A-CATH;  Surgeon: Harl Bowie, MD;  [facility];  Service: General;  Laterality: Left;  . PORTACATH PLACEMENT  03/19/2012   Procedure: INSERTION PORT-A-CATH;  Surgeon: Harl Bowie, MD;  Location: [place];  Service: General;  Laterality: N/A;  . PROCTOSCOPY  01/17/2012   Procedure: PROCTOSCOPY;  Surgeon: Harl Bowie, MD;  [facility];  Service: General;;    Social History   Socioeconomic History  . Marital status: Single    Spouse name: Not on  file  . Number of children: Not on file  . Years of education: Not on file  . Highest education level: Not on file  Occupational History  . Not on file  Social Needs  . Financial resource strain: Not on file  . Food insecurity:    Worry: Not on file    Inability: Not on file  . Transportation needs:    Medical: Not on file    Non-medical: Not on file  Tobacco Use  . Smoking status: Never Smoker  . Smokeless tobacco: Never Used  . Tobacco comment: NEVER USED TOBACC0  Substance and Sexual Activity  . Alcohol use: No    Alcohol/week: 0.0 oz  . Drug use: No  . Sexual activity: Not on file  Lifestyle  . Physical activity:    Days per week: Not on file    Minutes per session: Not on file  . Stress: Not on file  Relationships  . Social connections:    Talks on phone: Not on file    Gets together: Not on file    Attends religious service: Not on file    Active member of club or organization: Not on file    Attends meetings of clubs or organizations: Not on file    Relationship status: Not on file  Other Topics Concern  . Not on file  Social History Narrative  . Not on file    Family History  Problem Relation Age  of Onset  . Diabetes Father   . Cancer Maternal Aunt        colon  . Cancer Maternal Grandmother        colon    Review of Systems  Constitutional: Negative for chills and fever.  Respiratory: Negative for cough, shortness of breath and wheezing.   Cardiovascular: Negative for chest pain, palpitations and leg swelling.  Neurological: Negative for light-headedness and headaches.       Objective:   Vitals:   10/27/17 0831  BP: 124/74  Pulse: (!) 106  Resp: 16  Temp: 98.5 F (36.9 C)  SpO2: 95%   BP Readings from Last 3 Encounters:  10/27/17 124/74  10/09/17 (!) 158/77  07/28/17 134/80   Wt Readings from Last 3 Encounters:  10/27/17 (!) 362 lb (164.2 kg)  10/09/17 (!) 351 lb (159.2 kg)  07/28/17 (!) 356 lb (161.5 kg)   Body mass index is 49.1 kg/m.   Physical Exam    Constitutional: Appears well-developed and well-nourished. No distress.  HENT:  Head: Normocephalic and atraumatic.  Neck: Neck supple. No tracheal deviation present. No thyromegaly present.  No cervical lymphadenopathy Cardiovascular: Normal rate, regular rhythm and normal heart sounds.   No murmur heard. No carotid bruit .  No edema Pulmonary/Chest: Effort normal and breath sounds normal. No respiratory distress. No has no wheezes. No rales.  Skin: Skin is warm and dry. Not diaphoretic. Small scabbed laceration w/o surrounding redness or discharge Psychiatric: Normal mood and affect. Behavior is normal.      Assessment & Plan:    See Problem List for Assessment and Plan of chronic medical problems.

## 2017-10-27 ENCOUNTER — Ambulatory Visit: Payer: BLUE CROSS/BLUE SHIELD | Admitting: Internal Medicine

## 2017-10-27 ENCOUNTER — Encounter: Payer: Self-pay | Admitting: Internal Medicine

## 2017-10-27 VITALS — BP 124/74 | HR 106 | Temp 98.5°F | Resp 16 | Wt 362.0 lb

## 2017-10-27 DIAGNOSIS — I10 Essential (primary) hypertension: Secondary | ICD-10-CM | POA: Diagnosis not present

## 2017-10-27 DIAGNOSIS — E7849 Other hyperlipidemia: Secondary | ICD-10-CM | POA: Diagnosis not present

## 2017-10-27 DIAGNOSIS — R600 Localized edema: Secondary | ICD-10-CM

## 2017-10-27 DIAGNOSIS — M170 Bilateral primary osteoarthritis of knee: Secondary | ICD-10-CM

## 2017-10-27 DIAGNOSIS — F419 Anxiety disorder, unspecified: Secondary | ICD-10-CM

## 2017-10-27 DIAGNOSIS — E119 Type 2 diabetes mellitus without complications: Secondary | ICD-10-CM

## 2017-10-27 DIAGNOSIS — S8991XA Unspecified injury of right lower leg, initial encounter: Secondary | ICD-10-CM

## 2017-10-27 DIAGNOSIS — Z794 Long term (current) use of insulin: Secondary | ICD-10-CM | POA: Diagnosis not present

## 2017-10-27 LAB — POCT GLYCOSYLATED HEMOGLOBIN (HGB A1C): HEMOGLOBIN A1C: 7.8 % — AB (ref 4.0–5.6)

## 2017-10-27 NOTE — Patient Instructions (Addendum)
  Your a1c was checked today.     Medications reviewed and updated.  No changes recommended at this time.     Please followup in 3 months

## 2017-10-27 NOTE — Assessment & Plan Note (Signed)
Chronic, stable Continue lasix at current dose

## 2017-10-27 NOTE — Assessment & Plan Note (Signed)
Continue meloxicam

## 2017-10-27 NOTE — Assessment & Plan Note (Signed)
a1c today 7.8% Will adjust insulin Continue metformin and onglyza Continue regular exercise Work on weight loss Advised him to be more compliant with a a diabetic diet

## 2017-10-27 NOTE — Assessment & Plan Note (Signed)
BP well controlled Current regimen effective and well tolerated Continue current medications at current doses cmp done recently - reviewed

## 2017-10-27 NOTE — Assessment & Plan Note (Signed)
Controlled, stable Continue current dose of medication  

## 2017-10-27 NOTE — Assessment & Plan Note (Signed)
Lipid panel has been well controlled - checked 3 months ago Continue statin at current dose

## 2017-10-27 NOTE — Assessment & Plan Note (Signed)
Minor scrap to anterior lower leg No evidence of infection Itches, but no pain He will monitor closely for infection

## 2017-10-30 ENCOUNTER — Encounter: Payer: Self-pay | Admitting: Internal Medicine

## 2017-11-04 ENCOUNTER — Encounter: Payer: Self-pay | Admitting: Internal Medicine

## 2018-01-25 NOTE — Progress Notes (Signed)
Subjective:    Patient ID: Brandon Schaefer, male    DOB: 1955-12-15, 62 y.o.   MRN: [id number]  HPI The patient is here for follow up.  Tremor in hands: he notices it when he looks at his hands.  He denies anything that makes it worse.  He denies difficulty writing or eating soup.  He denies family members having a tremor.   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  The past couple of months his pain has been much worse.    Diabetes: He is taking his medication daily as prescribed. He is more compliant with a diabetic diet. He is not exercising regularly. He monitors his sugars and they have been running 77-246.  He is up-to-date with an ophthalmology examination.   Hypertension: He is taking his medication daily. He is compliant with a low sodium diet.  He denies chest pain, palpitations, edema, shortness of breath and regular headaches. He is not exercising regularly.  He does  monitor his blood pressure at home and it is well controlled.    Obesity:  He is not currently exercising.  He has decreased his sugar intake.  He is eating more healthy.    Medications and allergies reviewed with patient and updated if appropriate.  Patient Active Problem List   Diagnosis Date Noted  . Right leg injury 10/27/2017  . Dental infection 07/28/2017  . Cellulitis of foot 12/06/2015  . Sepsis due to cellulitis ([place]) 11/26/2015  . Right medial malleolar fracture 11/26/2015  . Anxiety 11/26/2015  . Hyperlipidemia 11/26/2015  . Bilateral leg edema 08/22/2015  . Back pain 03/20/2015  . Anemia, iron deficiency 05/05/2012  . DM (diabetes mellitus) ([place]) 01/18/2012  . HTN (hypertension) 01/18/2012  . Obesity 01/17/2012  . Cancer of sigmoid colon ([place]) 12/23/2011  . Arthritis of knee, degenerative 08/09/2011  . Peripheral nerve disease 01/01/2006    Current Outpatient Medications on File Prior to Visit  Medication Sig Dispense Refill  . aspirin EC 81 MG tablet Take 81  mg by mouth daily.    . carvedilol (COREG) 6.25 MG tablet Take 1 tablet (6.25 mg total) by mouth 2 (two) times daily with a meal. 180 tablet 3  . CONTOUR NEXT TEST test strip 3 (three) times daily. as directed  2  . furosemide (LASIX) 40 MG tablet Take 1 tablet (40 mg total) by mouth 2 (two) times daily. 180 tablet 3  . insulin lispro protamine-lispro (HUMALOG MIX 75/25) (75-25) 100 UNIT/ML SUSP injection 30 units in the am and 20 units at night 30 mL 3  . meloxicam (MOBIC) 15 MG tablet TAKE 1 TABLET(15 MG) BY MOUTH DAILY 90 tablet 3  . metFORMIN (GLUCOPHAGE) 1000 MG tablet Take 1,000 mg by mouth 2 (two) times daily with a meal.     . morphine (MS CONTIN) 30 MG 12 hr tablet Take 30 mg by mouth 2 (two) times daily as needed.  0  . MOVANTIK 25 MG TABS tablet     . NARCAN 4 MG/0.1ML LIQD nasal spray kit     . Oxycodone HCl 10 MG TABS Take 10 mg by mouth.  0  . potassium chloride (K-DUR) 10 MEQ tablet Take 1 tablet (10 mEq total) by mouth daily. 90 tablet 3  . ramipril (ALTACE) 10 MG capsule TAKE 1 CAPSULE(10 MG) BY MOUTH DAILY BEFORE BREAKFAST 90 capsule 1  . rosuvastatin (CRESTOR) 20 MG tablet Take 1 tablet (20 mg total) by mouth daily before  breakfast. 90 tablet 3  . saxagliptin HCl (ONGLYZA) 5 MG TABS tablet Take 1 tablet (5 mg total) by mouth daily. 90 tablet 1  . senna-docusate (SENOKOT-S) 8.6-50 MG per tablet Take 1 tablet by mouth 2 (two) times daily. While taking pain meds to prevent constipation (Patient taking differently: Take 1 tablet by mouth 2 (two) times daily as needed for moderate constipation. While taking pain meds to prevent constipation) 30 tablet 0  . sertraline (ZOLOFT) 100 MG tablet TAKE 1 AND 1/2 TABLETS(150 MG) BY MOUTH DAILY BEFORE BREAKFAST 135 tablet 1   No current facility-administered medications on file prior to visit.     Past Medical History:  Diagnosis Date  . Allergy   . Anemia   . Anxiety   . colon ca dx'd 11/2011  . Dental abscess 08/03/2015  . Diabetes  mellitus   . Headache(784.0)   . History of blood transfusion   . Hyperlipidemia   . Hypertension   . Neuromuscular disorder ([place])    peripheral neuropathy  . Shortness of breath    with exertion    Past Surgical History:  Procedure Laterality Date  . COMPLEX WOUND CLOSURE N/A 01/06/2013   Procedure: EXCISION CHRONIC ABDOMINAL WOUND;  Surgeon: Harl Bowie, MD;  [facility];  Service: General;  Laterality: N/A;  . CYSTOSCOPY W/ URETERAL STENT PLACEMENT Bilateral 01/04/2013   Procedure: CYSTOSCOPY WITH RETROGRADE PYELOGRAM/Right double J URETERAL STENT PLACEMENT;  Surgeon: Alexis Frock, MD;  [facility];  Service: Urology;  Laterality: Bilateral;  . CYSTOSCOPY WITH RETROGRADE PYELOGRAM, URETEROSCOPY AND STENT PLACEMENT Right 01/06/2013   Procedure: CYSTOSCOPY WITH RIGHT RETROGRADE PYELOGRAM, URETEROSCOPY AND BILATERAL STENT EXCHANGE, BILATERAL STONE BASKET EXTRACTION ;  Surgeon: Alexis Frock, MD;  [facility];  Service: Urology;  Laterality: Right;  . FRACTURE SURGERY      ORIF-left radius has pin  . HOLMIUM LASER APPLICATION N/A 2/48/2500   Procedure: HOLMIUM LASER APPLICATION;  Surgeon: Alexis Frock, MD;  [facility];  Service: Urology;  Laterality: N/A;  . NEPHROLITHOTOMY Left 01/04/2013   Procedure: NEPHROLITHOTOMY PERCUTANEOUS  LEFT 1ST STAGE PERCUTANEOUS NEPHROSTOLITHOTOMY;  Surgeon: Alexis Frock, MD;  [facility];  Service: Urology;  Laterality: Left;  . NEPHROLITHOTOMY Left 01/06/2013   Procedure: NEPHROLITHOTOMY PERCUTANEOUS SECOND LOOK;  Surgeon: Alexis Frock, MD;  [facility];  Service: Urology;  Laterality: Left;  . PARTIAL COLECTOMY  01/17/2012   Procedure: PARTIAL COLECTOMY;  Surgeon: Harl Bowie, MD;  [facility];  Service: General;;  . PILONIDAL CYST EXCISION    . PORT-A-CATH REMOVAL Left 01/06/2013   Procedure: REMOVAL PORT-A-CATH;  Surgeon: Harl Bowie, MD;  [facility];  Service: General;  Laterality:  Left;  . PORTACATH PLACEMENT  03/19/2012   Procedure: INSERTION PORT-A-CATH;  Surgeon: Harl Bowie, MD;  Location: [place];  Service: General;  Laterality: N/A;  . PROCTOSCOPY  01/17/2012   Procedure: PROCTOSCOPY;  Surgeon: Harl Bowie, MD;  [facility];  Service: General;;    Social History   Socioeconomic History  . Marital status: Single    Spouse name: Not on file  . Number of children: Not on file  . Years of education: Not on file  . Highest education level: Not on file  Occupational History  . Not on file  Social Needs  . Financial resource strain: Not on file  . Food insecurity:    Worry: Not on file    Inability: Not on file  .  Transportation needs:    Medical: Not on file    Non-medical: Not on file  Tobacco Use  . Smoking status: Never Smoker  . Smokeless tobacco: Never Used  . Tobacco comment: NEVER USED TOBACC0  Substance and Sexual Activity  . Alcohol use: No    Alcohol/week: 0.0 standard drinks  . Drug use: No  . Sexual activity: Not on file  Lifestyle  . Physical activity:    Days per week: Not on file    Minutes per session: Not on file  . Stress: Not on file  Relationships  . Social connections:    Talks on phone: Not on file    Gets together: Not on file    Attends religious service: Not on file    Active member of club or organization: Not on file    Attends meetings of clubs or organizations: Not on file    Relationship status: Not on file  Other Topics Concern  . Not on file  Social History Narrative  . Not on file    Family History  Problem Relation Age of Onset  . Diabetes Father   . Cancer Maternal Aunt        colon  . Cancer Maternal Grandmother        colon    Review of Systems  Constitutional: Negative for chills and fever.  Respiratory: Negative for cough, shortness of breath and wheezing.   Cardiovascular: Negative for chest pain, palpitations and leg swelling.  Neurological: Positive  for numbness (feet). Negative for light-headedness and headaches.       Objective:   Vitals:   01/27/18 0815  BP: (!) 152/74  Pulse: 84  Resp: 20  Temp: 98.3 F (36.8 C)  SpO2: 97%   BP Readings from Last 3 Encounters:  01/27/18 (!) 152/74  10/27/17 124/74  10/09/17 (!) 158/77   Wt Readings from Last 3 Encounters:  01/27/18 (!) 363 lb (164.7 kg)  10/27/17 (!) 362 lb (164.2 kg)  10/09/17 (!) 351 lb (159.2 kg)   Body mass index is 49.23 kg/m.   Physical Exam    Constitutional: Appears well-developed and well-nourished. No distress.  HENT:  Head: Normocephalic and atraumatic.  Neck: Neck supple. No tracheal deviation present. No thyromegaly present.  No cervical lymphadenopathy Cardiovascular: Normal rate, regular rhythm and normal heart sounds.   No murmur heard. No carotid bruit .  No edema Pulmonary/Chest: Effort normal and breath sounds normal. No respiratory distress. No has no wheezes. No rales.  Neuro; mild resting tremor b/l hands  Skin: Skin is warm and dry. Not diaphoretic.  Psychiatric: Normal mood and affect. Behavior is normal.      Assessment & Plan:    See Problem List for Assessment and Plan of chronic medical problems.

## 2018-01-27 ENCOUNTER — Encounter: Payer: Self-pay | Admitting: Internal Medicine

## 2018-01-27 ENCOUNTER — Other Ambulatory Visit (INDEPENDENT_AMBULATORY_CARE_PROVIDER_SITE_OTHER): Payer: BLUE CROSS/BLUE SHIELD

## 2018-01-27 ENCOUNTER — Ambulatory Visit: Payer: BLUE CROSS/BLUE SHIELD | Admitting: Internal Medicine

## 2018-01-27 VITALS — BP 152/74 | HR 84 | Temp 98.3°F | Resp 20 | Ht 72.0 in | Wt 363.0 lb

## 2018-01-27 DIAGNOSIS — I10 Essential (primary) hypertension: Secondary | ICD-10-CM

## 2018-01-27 DIAGNOSIS — E119 Type 2 diabetes mellitus without complications: Secondary | ICD-10-CM

## 2018-01-27 DIAGNOSIS — Z794 Long term (current) use of insulin: Secondary | ICD-10-CM | POA: Diagnosis not present

## 2018-01-27 DIAGNOSIS — R251 Tremor, unspecified: Secondary | ICD-10-CM

## 2018-01-27 DIAGNOSIS — Z23 Encounter for immunization: Secondary | ICD-10-CM

## 2018-01-27 DIAGNOSIS — G629 Polyneuropathy, unspecified: Secondary | ICD-10-CM

## 2018-01-27 DIAGNOSIS — Z6841 Body Mass Index (BMI) 40.0 and over, adult: Secondary | ICD-10-CM

## 2018-01-27 DIAGNOSIS — E66813 Obesity, class 3: Secondary | ICD-10-CM

## 2018-01-27 LAB — COMPREHENSIVE METABOLIC PANEL
ALK PHOS: 78 U/L (ref 39–117)
ALT: 20 U/L (ref 0–53)
AST: 12 U/L (ref 0–37)
Albumin: 4.1 g/dL (ref 3.5–5.2)
BUN: 19 mg/dL (ref 6–23)
CO2: 24 meq/L (ref 19–32)
Calcium: 9.3 mg/dL (ref 8.4–10.5)
Chloride: 103 mEq/L (ref 96–112)
Creatinine, Ser: 0.93 mg/dL (ref 0.40–1.50)
GFR: 86.95 mL/min (ref >60.00)
GLUCOSE: 247 mg/dL — AB (ref 70–99)
Potassium: 4.2 mEq/L (ref 3.5–5.1)
Sodium: 136 mEq/L (ref 135–145)
Total Bilirubin: 0.7 mg/dL (ref 0.2–1.2)
Total Protein: 6.8 g/dL (ref 6.0–8.3)

## 2018-01-27 LAB — HEMOGLOBIN A1C: HEMOGLOBIN A1C: 7.3 % — AB (ref 4.6–6.5)

## 2018-01-27 MED ORDER — GABAPENTIN 100 MG PO CAPS: ORAL_CAPSULE | ORAL | 3 refills | Status: DC

## 2018-01-27 NOTE — Assessment & Plan Note (Signed)
B/l hands Will monitor for now - he feels it may be related to neuropathy May need neuro eval

## 2018-01-27 NOTE — Assessment & Plan Note (Signed)
slighty elevated here today - likely from walking back from the waiting room - will recheck Well controlled at home Current regimen effective and well tolerated Continue current medications at current doses cmp

## 2018-01-27 NOTE — Patient Instructions (Signed)
  Tests ordered today. Your results will be released to [place] (or called to you) after review, usually within 72hours after test completion. If any changes need to be made, you will be notified at that same time.  Flu immunization administered today.    Medications reviewed and updated.  Changes include :   Starting gabapentin three times a day as prescribed  Your prescription(s) have been submitted to your pharmacy. Please take as directed and contact our office if you believe you are having problem(s) with the medication(s).   Please followup in 6 months

## 2018-01-27 NOTE — Assessment & Plan Note (Addendum)
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

## 2018-01-27 NOTE — Assessment & Plan Note (Signed)
encouraged weight loss - start exercising once neuropathy pain better controlled Decrease portions F/u in 6 months

## 2018-01-27 NOTE — Assessment & Plan Note (Signed)
Has been more compliant with a diabetic diet No exercise encouraged exercise, weight loss a1c

## 2018-02-11 ENCOUNTER — Encounter: Payer: Self-pay | Admitting: Internal Medicine

## 2018-02-12 ENCOUNTER — Other Ambulatory Visit: Payer: Self-pay | Admitting: Internal Medicine

## 2018-03-05 ENCOUNTER — Other Ambulatory Visit: Payer: Self-pay | Admitting: Internal Medicine

## 2018-03-14 ENCOUNTER — Other Ambulatory Visit: Payer: Self-pay | Admitting: Internal Medicine

## 2018-04-02 ENCOUNTER — Other Ambulatory Visit: Payer: Self-pay | Admitting: Internal Medicine

## 2018-04-20 ENCOUNTER — Other Ambulatory Visit: Payer: Self-pay | Admitting: Internal Medicine

## 2018-05-06 ENCOUNTER — Other Ambulatory Visit: Payer: Self-pay | Admitting: Internal Medicine

## 2018-05-28 ENCOUNTER — Other Ambulatory Visit: Payer: Self-pay | Admitting: Internal Medicine

## 2018-06-03 ENCOUNTER — Other Ambulatory Visit: Payer: Self-pay | Admitting: Internal Medicine

## 2018-06-03 MED ORDER — METFORMIN HCL 1000 MG PO TABS: 1000.0000 mg | ORAL_TABLET | Freq: Two times a day (BID) | ORAL | 1 refills | Status: DC

## 2018-06-03 NOTE — Telephone Encounter (Signed)
Copied from [id number] [id number]. Topic: General - Other >> Jun 03, 2018  8:37 AM Lennox Solders wrote: Reason for CRM:pt has called his pharm. Pt needs a refill on metformin . [facility] [address] Abbott Pao has an appt with dr burns in April 2020

## 2018-06-03 NOTE — Telephone Encounter (Signed)
Requested medication (s) are due for refill today: yes  Requested medication (s) are on the active medication list: yes  Last refill:  08/11/12  Future visit scheduled: yes 1 month  Notes to clinic:  Historical medication   Requested Prescriptions  Pending Prescriptions Disp Refills   metFORMIN (GLUCOPHAGE) 1000 MG tablet      Sig: Take 1 tablet (1,000 mg total) by mouth 2 (two) times daily with a meal.     Endocrinology:  Diabetes - Biguanides Passed - 06/03/2018  8:39 AM      Passed - Cr in normal range and within 360 days    Creatinine  Date Value Ref Range Status  10/09/2017 1.11 0.61 - 1.24 mg/dL Final  01/09/2017 1.0 0.7 - 1.3 mg/dL Final   Creatinine, Ser  Date Value Ref Range Status  01/27/2018 0.93 0.40 - 1.50 mg/dL Final         Passed - HBA1C is between 0 and 7.9 and within 180 days    Hgb A1c MFr Bld  Date Value Ref Range Status  01/27/2018 7.3 (H) 4.6 - 6.5 % Final    Comment:    Glycemic Control Guidelines for People with Diabetes:Non Diabetic:  <6%Goal of Therapy: <7%Additional Action Suggested:  >8%          Passed - eGFR in normal range and within 360 days    GFR, Est AFR Am  Date Value Ref Range Status  10/09/2017 >60 >60 mL/min Final    Comment:    (NOTE) The eGFR has been calculated using the CKD EPI equation. This calculation has not been validated in all clinical situations. eGFR's persistently <60 mL/min signify possible Chronic Kidney Disease.    GFR, Est Non Af Am  Date Value Ref Range Status  10/09/2017 >60 >60 mL/min Final   GFR  Date Value Ref Range Status  01/27/2018 86.95 >60.00 mL/min Final         Passed - Valid encounter within last 6 months    Recent Outpatient Visits          4 months ago Type 2 diabetes mellitus without complication, with long-term current use of insulin ([place])   [place], MD   7 months ago Essential hypertension   [place],  Claudina Lick, MD   10 months ago Type 2 diabetes mellitus without complication, with long-term current use of insulin ([place])   [place], Claudina Lick, MD   1 year ago Bilateral leg edema   [facility] -Georges Mouse, MD   1 year ago Essential hypertension   [place], Claudina Lick, MD      Future Appointments            In 1 month Burns, Claudina Lick, MD [place], [facility]

## 2018-06-07 ENCOUNTER — Other Ambulatory Visit: Payer: Self-pay | Admitting: Internal Medicine

## 2018-07-27 ENCOUNTER — Other Ambulatory Visit: Payer: Self-pay | Admitting: Internal Medicine

## 2018-07-28 NOTE — Progress Notes (Signed)
Virtual Visit via Video Note  I connected with Brandon Schaefer on 07/29/18 at  8:30 AM EDT by a video enabled telemedicine application and verified that I am speaking with the correct person using two identifiers.   I discussed the limitations of evaluation and management by telemedicine and the availability of in person appointments. The patient expressed understanding and agreed to proceed.  The patient is currently at home and I am in the office.    No referring provider.    History of Present Illness: He is here for follow up of his chronic medical conditions.    He is not exercising regularly.    Diabetes: He is taking his medication daily as prescribed. He is compliant with a diabetic diet.  He monitors his sugars and they have been running low 100's most of the time, occasionally 180-190.   Hypertension: He is taking his medication daily. He is compliant with a low sodium diet.  He denies chest pain, palpitations, edema, shortness of breath and regular headaches.  He does not monitor his blood pressure at home.    Hyperlipidemia: He is taking his medication daily. He is compliant with a low fat/cholesterol diet. He denies myalgias.   Anxiety: He is taking his medication daily as prescribed. He denies any side effects from the medication. He feels his anxiety is well controlled and he is happy with his current dose of medication.   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   He started at a low dose, but this was increased via my chart communication.  He states he is currently taking at thousand milligrams in total for the day and he has not seen any improvement in his pain.  Years ago he was on gabapentin when he was first diagnosed with neuropathy and it did not help at that time either.  He has not tried anything else besides narcotic pain medication.  He thinks the narcotic pain medication did help.   Review of Systems   Constitutional: Negative for chills and fever.  HENT:       Sneezing, runny nose - from allergies  Respiratory: Positive for cough (from PND). Negative for shortness of breath and wheezing.   Cardiovascular: Negative for chest pain, palpitations and leg swelling.  Neurological: Positive for headaches (sinus related). Negative for dizziness.     Social History   Socioeconomic History  . Marital status: Single    Spouse name: Not on file  . Number of children: Not on file  . Years of education: Not on file  . Highest education level: Not on file  Occupational History  . Not on file  Social Needs  . Financial resource strain: Not on file  . Food insecurity:    Worry: Not on file    Inability: Not on file  . Transportation needs:    Medical: Not on file    Non-medical: Not on file  Tobacco Use  . Smoking status: Never Smoker  . Smokeless tobacco: Never Used  . Tobacco comment: NEVER USED TOBACC0  Substance and Sexual Activity  . Alcohol use: No    Alcohol/week: 0.0 standard drinks  . Drug use: No  . Sexual activity: Not on file  Lifestyle  . Physical activity:    Days per week: Not on file    Minutes per session: Not on file  . Stress: Not on file  Relationships  . Social connections:    Talks on  phone: Not on file    Gets together: Not on file    Attends religious service: Not on file    Active member of club or organization: Not on file    Attends meetings of clubs or organizations: Not on file    Relationship status: Not on file  Other Topics Concern  . Not on file  Social History Narrative  . Not on file     Observations/Objective: Appears well in NAD  Sugars at home low 100s, occasionally 180-190 when he eats something he should not Blood pressure at home has been around 120/75  Lab Results  Component Value Date   WBC 8.2 10/09/2017   HGB 13.9 10/09/2017   HCT 40.2 10/09/2017   PLT 249 10/09/2017   GLUCOSE 247 (H) 01/27/2018   CHOL 103 07/28/2017    TRIG 132.0 07/28/2017   HDL 35.20 (L) 07/28/2017   LDLCALC 41 07/28/2017   ALT 20 01/27/2018   AST 12 01/27/2018   NA 136 01/27/2018   K 4.2 01/27/2018   CL 103 01/27/2018   CREATININE 0.93 01/27/2018   BUN 19 01/27/2018   CO2 24 01/27/2018   TSH 3.36 07/28/2017   HGBA1C 7.3 (H) 01/27/2018   MICROALBUR <0.7 12/22/2015    Assessment and Plan:  See Problem List for Assessment and Plan of chronic medical problems.   Follow Up Instructions:    I discussed the assessment and treatment plan with the patient. The patient was provided an opportunity to ask questions and all were answered. The patient agreed with the plan and demonstrated an understanding of the instructions.   The patient was advised to call back or seek an in-person evaluation if the symptoms worsen or if the condition fails to improve as anticipated.    Binnie Rail, MD

## 2018-07-29 ENCOUNTER — Encounter: Payer: Self-pay | Admitting: Internal Medicine

## 2018-07-29 ENCOUNTER — Ambulatory Visit (INDEPENDENT_AMBULATORY_CARE_PROVIDER_SITE_OTHER): Payer: Self-pay | Admitting: Internal Medicine

## 2018-07-29 DIAGNOSIS — F419 Anxiety disorder, unspecified: Secondary | ICD-10-CM

## 2018-07-29 DIAGNOSIS — E119 Type 2 diabetes mellitus without complications: Secondary | ICD-10-CM

## 2018-07-29 DIAGNOSIS — G629 Polyneuropathy, unspecified: Secondary | ICD-10-CM

## 2018-07-29 DIAGNOSIS — I10 Essential (primary) hypertension: Secondary | ICD-10-CM

## 2018-07-29 DIAGNOSIS — Z794 Long term (current) use of insulin: Secondary | ICD-10-CM

## 2018-07-29 DIAGNOSIS — E7849 Other hyperlipidemia: Secondary | ICD-10-CM

## 2018-07-29 MED ORDER — DULOXETINE HCL 30 MG PO CPEP: 30.0000 mg | ORAL_CAPSULE | Freq: Every day | ORAL | 5 refills | Status: DC

## 2018-07-29 MED ORDER — PREGABALIN 50 MG PO CAPS: 50.0000 mg | ORAL_CAPSULE | Freq: Three times a day (TID) | ORAL | 0 refills | Status: DC

## 2018-07-29 NOTE — Assessment & Plan Note (Signed)
BP Readings from Last 3 Encounters:  01/27/18 (!) 152/74  10/27/17 124/74  10/09/17 (!) 158/77   Blood pressure has been very well controlled at home around 120/75 Continue current medication at current dose

## 2018-07-29 NOTE — Assessment & Plan Note (Signed)
For the most part his sugars have been controlled-low 100s, but when he does not eat eat something he should not the sugars to spike to 180-190 Stressed importance of limiting sugar/carbs Not currently exercising, but he is eating fairly well and has lost a little more weight Continue weight loss efforts Should have A1c and other blood work checked once coronavirus situation has resolved No change in medication today

## 2018-07-29 NOTE — Assessment & Plan Note (Signed)
He feels his anxiety is fairly controlled He states he has not been taking the sertraline, but still feels he is doing okay  We will be starting the Cymbalta for his peripheral neuropathy and that may help under Anxiety any anxiety he does have Advised him to officially discontinue the sertraline

## 2018-07-29 NOTE — Assessment & Plan Note (Signed)
Continue the statin

## 2018-07-29 NOTE — Assessment & Plan Note (Signed)
Secondary to chemotherapy and diabetes His pain has gotten worse Gabapentin is not effective-he has tried it at 2 different occasions and there is been no improvement in his pain even after increasing the dose Start Lyrica 50 mg 3 times daily.  Discussed possible side effects Start Cymbalta 30 mg daily  Advised that he should follow-up with me in the office once the coronavirus situation is resolved

## 2018-08-24 ENCOUNTER — Other Ambulatory Visit: Payer: Self-pay | Admitting: Internal Medicine

## 2018-08-24 NOTE — Telephone Encounter (Signed)
Last refill was 07/29/18 Last OV 07/29/18 Next OV N/A

## 2018-09-20 ENCOUNTER — Other Ambulatory Visit: Payer: Self-pay | Admitting: Internal Medicine

## 2018-09-21 NOTE — Telephone Encounter (Signed)
Last refill was 08/24/18.  Last OV 07/29/18 Next OV N/A

## 2018-09-25 ENCOUNTER — Other Ambulatory Visit: Payer: Self-pay | Admitting: Internal Medicine

## 2018-10-20 ENCOUNTER — Other Ambulatory Visit: Payer: Self-pay | Admitting: Internal Medicine

## 2018-10-20 NOTE — Telephone Encounter (Signed)
[place] Controlled Database Checked Last filled: 09/22/18 # 90 LOV w/you: 07/29/18 Next appt w/you: None

## 2018-11-09 ENCOUNTER — Ambulatory Visit (INDEPENDENT_AMBULATORY_CARE_PROVIDER_SITE_OTHER): Payer: Managed Care, Other (non HMO) | Admitting: Internal Medicine

## 2018-11-09 ENCOUNTER — Encounter: Payer: Self-pay | Admitting: Internal Medicine

## 2018-11-09 ENCOUNTER — Other Ambulatory Visit (INDEPENDENT_AMBULATORY_CARE_PROVIDER_SITE_OTHER): Payer: Managed Care, Other (non HMO)

## 2018-11-09 ENCOUNTER — Other Ambulatory Visit: Payer: Self-pay

## 2018-11-09 VITALS — BP 158/70 | HR 90 | Temp 98.8°F | Resp 20 | Ht 72.0 in | Wt 377.0 lb

## 2018-11-09 DIAGNOSIS — E119 Type 2 diabetes mellitus without complications: Secondary | ICD-10-CM

## 2018-11-09 DIAGNOSIS — Z794 Long term (current) use of insulin: Secondary | ICD-10-CM

## 2018-11-09 LAB — COMPREHENSIVE METABOLIC PANEL
ALT: 18 U/L (ref 0–53)
AST: 16 U/L (ref 0–37)
Albumin: 4.2 g/dL (ref 3.5–5.2)
Alkaline Phosphatase: 80 U/L (ref 39–117)
BUN: 16 mg/dL (ref 6–23)
CO2: 21 mEq/L (ref 19–32)
Calcium: 9.6 mg/dL (ref 8.4–10.5)
Chloride: 104 mEq/L (ref 96–112)
Creatinine, Ser: 0.91 mg/dL (ref 0.40–1.50)
GFR: 83.68 mL/min (ref >60.00)
Glucose, Bld: 172 mg/dL — ABNORMAL HIGH (ref 70–99)
Potassium: 4.2 mEq/L (ref 3.5–5.1)
Sodium: 137 mEq/L (ref 135–145)
Total Bilirubin: 0.6 mg/dL (ref 0.2–1.2)
Total Protein: 7 g/dL (ref 6.0–8.3)

## 2018-11-09 LAB — HEMOGLOBIN A1C: Hgb A1c MFr Bld: 8.5 % — ABNORMAL HIGH (ref 4.6–6.5)

## 2018-11-09 MED ORDER — HUMALOG MIX 75/25 (75-25) 100 UNIT/ML ~~LOC~~ SUSP: SUBCUTANEOUS | 3 refills | Status: DC

## 2018-11-09 NOTE — Assessment & Plan Note (Addendum)
Sugars uncontrolled at home-often in the 300s, 200s, but occasionally will get a 109 or lower sugar He states he is compliant with a diabetic diet and eats 3 times a day Not active We will increase his insulin Humalog mix 75/25 from 30 units in the morning and 20 units at night to 35 units in the morning and 30 units at night-we will increase depending on how his sugars are He will monitor sugars closely-he understands the risk of hypoglycemia Continue metformin and Onglyza Encouraged him to increase his activity and ideally work on weight loss-he has gained some weight Follow-up in 3 months, sooner if needed Send note via my chart with sugar readings Check A1c, CMP today

## 2018-11-09 NOTE — Patient Instructions (Addendum)
Have blood work today.   Increase your insulin to 35 units in the morning and 30 units at night.    Continue to monitor your sugars closely and call with any concerns.     Follow up in 3 months

## 2018-11-09 NOTE — Progress Notes (Signed)
Subjective:    Patient ID: Brandon Schaefer, male    DOB: May 27, 1955, 63 y.o.   MRN: [id number]  HPI The patient is here for an acute visit.   Elevated sugars:   He is taking his diabetic medications as prescribed.  His sugars have been higher the last two months.  His sugars at home have been 300's in the morning.  Around lunch it is 200's, sometimes 160 by late afternoon.  The afternoon he has seen a sugar of 109.  He has been compliant with a low sugar/carb diet.  He is not exercising.  He was not working for a couple of months and was less active, but now he is back working.  He does have knee arthritis and outside of work is not active.  He denies changes in medication.    He was on a much higher dose of insulin in the past when the sugars were not controlled and he did take more insulin yesterday and that did help improve his sugars, but he did not need insulin last night.  Sugars this morning were in the 200s.  He does experience excessive urination when his sugars are elevated.  He denies any confusion, blurry vision, headaches or lightheadedness.  Medications and allergies reviewed with patient and updated if appropriate.  Patient Active Problem List   Diagnosis Date Noted  . Tremor 01/27/2018  . Right leg injury 10/27/2017  . Dental infection 07/28/2017  . Cellulitis of foot 12/06/2015  . Sepsis due to cellulitis ([place]) 11/26/2015  . Right medial malleolar fracture 11/26/2015  . Anxiety 11/26/2015  . Hyperlipidemia 11/26/2015  . Bilateral leg edema 08/22/2015  . Back pain 03/20/2015  . Anemia, iron deficiency 05/05/2012  . DM (diabetes mellitus) ([place]) 01/18/2012  . HTN (hypertension) 01/18/2012  . Obesity 01/17/2012  . Cancer of sigmoid colon ([place]) 12/23/2011  . Arthritis of knee, degenerative 08/09/2011  . Peripheral nerve disease 01/01/2006    Current Outpatient Medications on File Prior to Visit  Medication Sig Dispense Refill  . aspirin EC 81 MG tablet Take 81  mg by mouth daily.    . carvedilol (COREG) 6.25 MG tablet TAKE 1 TABLET(6.25 MG) BY MOUTH TWICE DAILY WITH A MEAL 180 tablet 1  . CONTOUR NEXT TEST test strip 3 (three) times daily. as directed  2  . DULoxetine (CYMBALTA) 30 MG capsule Take 1 capsule (30 mg total) by mouth daily. 30 capsule 5  . furosemide (LASIX) 40 MG tablet Take 1 tablet (40 mg total) by mouth 2 (two) times daily. 180 tablet 3  . insulin lispro protamine-lispro (HUMALOG MIX 75/25) (75-25) 100 UNIT/ML SUSP injection INJECT 30 UNITS UNDER THE SKIN EVERY MORNING AND 20 UNITS AT NIGHT 30 mL 3  . meloxicam (MOBIC) 15 MG tablet TAKE 1 TABLET(15 MG) BY MOUTH DAILY 90 tablet 1  . metFORMIN (GLUCOPHAGE) 1000 MG tablet Take 1 tablet (1,000 mg total) by mouth 2 (two) times daily with a meal. 180 tablet 1  . ONGLYZA 5 MG TABS tablet TAKE 1 TABLET(5 MG) BY MOUTH DAILY 90 tablet 1  . potassium chloride (K-DUR) 10 MEQ tablet Take 1 tablet (10 mEq total) by mouth daily. 90 tablet 3  . pregabalin (LYRICA) 50 MG capsule TAKE 1 CAPSULE(50 MG) BY MOUTH THREE TIMES DAILY 90 capsule 1  . ramipril (ALTACE) 10 MG capsule TAKE 1 CAPSULE(10 MG) BY MOUTH DAILY BEFORE BREAKFAST 90 capsule 1  . rosuvastatin (CRESTOR) 20 MG tablet TAKE 1 TABLET(20 MG) BY  MOUTH DAILY BEFORE BREAKFAST 90 tablet 1   No current facility-administered medications on file prior to visit.     Past Medical History:  Diagnosis Date  . Allergy   . Anemia   . Anxiety   . colon ca dx'd 11/2011  . Dental abscess 08/03/2015  . Diabetes mellitus   . Headache(784.0)   . History of blood transfusion   . Hyperlipidemia   . Hypertension   . Neuromuscular disorder ([place])    peripheral neuropathy  . Shortness of breath    with exertion    Past Surgical History:  Procedure Laterality Date  . COMPLEX WOUND CLOSURE N/A 01/06/2013   Procedure: EXCISION CHRONIC ABDOMINAL WOUND;  Surgeon: Harl Bowie, MD;  Location: [facility];  Service: General;  Laterality: N/A;  . CYSTOSCOPY W/  URETERAL STENT PLACEMENT Bilateral 01/04/2013   Procedure: CYSTOSCOPY WITH RETROGRADE PYELOGRAM/Right double J URETERAL STENT PLACEMENT;  Surgeon: Alexis Frock, MD;  Location: [facility];  Service: Urology;  Laterality: Bilateral;  . CYSTOSCOPY WITH RETROGRADE PYELOGRAM, URETEROSCOPY AND STENT PLACEMENT Right 01/06/2013   Procedure: CYSTOSCOPY WITH RIGHT RETROGRADE PYELOGRAM, URETEROSCOPY AND BILATERAL STENT EXCHANGE, BILATERAL STONE BASKET EXTRACTION ;  Surgeon: Alexis Frock, MD;  Location: [facility];  Service: Urology;  Laterality: Right;  . FRACTURE SURGERY      ORIF-left radius has pin  . HOLMIUM LASER APPLICATION N/A 2/77/8242   Procedure: HOLMIUM LASER APPLICATION;  Surgeon: Alexis Frock, MD;  Location: [facility];  Service: Urology;  Laterality: N/A;  . NEPHROLITHOTOMY Left 01/04/2013   Procedure: NEPHROLITHOTOMY PERCUTANEOUS  LEFT 1ST STAGE PERCUTANEOUS NEPHROSTOLITHOTOMY;  Surgeon: Alexis Frock, MD;  Location: [facility];  Service: Urology;  Laterality: Left;  . NEPHROLITHOTOMY Left 01/06/2013   Procedure: NEPHROLITHOTOMY PERCUTANEOUS SECOND LOOK;  Surgeon: Alexis Frock, MD;  Location: [facility];  Service: Urology;  Laterality: Left;  . PARTIAL COLECTOMY  01/17/2012   Procedure: PARTIAL COLECTOMY;  Surgeon: Harl Bowie, MD;  Location: [facility];  Service: General;;  . PILONIDAL CYST EXCISION    . PORT-A-CATH REMOVAL Left 01/06/2013   Procedure: REMOVAL PORT-A-CATH;  Surgeon: Harl Bowie, MD;  Location: [facility];  Service: General;  Laterality: Left;  . PORTACATH PLACEMENT  03/19/2012   Procedure: INSERTION PORT-A-CATH;  Surgeon: Harl Bowie, MD;  Location: [facility];  Service: General;  Laterality: N/A;  . PROCTOSCOPY  01/17/2012   Procedure: PROCTOSCOPY;  Surgeon: Harl Bowie, MD;  Location: [facility];  Service: General;;    Social History   Socioeconomic History  . Marital status: Single    Spouse name: Not on file  . Number of children: Not on file  .  Years of education: Not on file  . Highest education level: Not on file  Occupational History  . Not on file  Social Needs  . Financial resource strain: Not on file  . Food insecurity    Worry: Not on file    Inability: Not on file  . Transportation needs    Medical: Not on file    Non-medical: Not on file  Tobacco Use  . Smoking status: Never Smoker  . Smokeless tobacco: Never Used  . Tobacco comment: NEVER USED TOBACC0  Substance and Sexual Activity  . Alcohol use: No    Alcohol/week: 0.0 standard drinks  . Drug use: No  . Sexual activity: Not on file  Lifestyle  . Physical activity    Days per week: Not on file    Minutes per session: Not  on file  . Stress: Not on file  Relationships  . Social Herbalist on phone: Not on file    Gets together: Not on file    Attends religious service: Not on file    Active member of club or organization: Not on file    Attends meetings of clubs or organizations: Not on file    Relationship status: Not on file  Other Topics Concern  . Not on file  Social History Narrative  . Not on file    Family History  Problem Relation Age of Onset  . Diabetes Father   . Cancer Maternal Aunt        colon  . Cancer Maternal Grandmother        colon    Review of Systems  Constitutional: Negative for fever.  Eyes: Negative for visual disturbance.  Respiratory: Negative for shortness of breath.   Cardiovascular: Positive for leg swelling. Negative for chest pain and palpitations.  Gastrointestinal: Negative for abdominal pain and nausea.  Endocrine: Positive for polyuria.  Musculoskeletal: Positive for arthralgias.  Neurological: Negative for light-headedness and headaches.       Objective:   Vitals:   11/09/18 1049  BP: (!) 158/70  Pulse: 90  Resp: 20  Temp: 98.8 F (37.1 C)  SpO2: 97%   BP Readings from Last 3 Encounters:  11/09/18 (!) 158/70  01/27/18 (!) 152/74  10/27/17 124/74   Wt Readings from Last 3  Encounters:  11/09/18 (!) 377 lb (171 kg)  01/27/18 (!) 363 lb (164.7 kg)  10/27/17 (!) 362 lb (164.2 kg)   Body mass index is 51.13 kg/m.   Physical Exam    Constitutional: Appears well-developed and well-nourished. No distress.  HENT:  Head: Normocephalic and atraumatic.  Neck: Neck supple. No tracheal deviation present. No thyromegaly present.  No cervical lymphadenopathy Cardiovascular: Normal rate, regular rhythm and normal heart sounds.    Mild bilateral lower extremity edema Pulmonary/Chest: Effort normal and breath sounds normal. No respiratory distress. No has no wheezes. No rales.  Skin: Skin is warm and dry. Not diaphoretic.  Psychiatric: Normal mood and affect. Behavior is normal.       Assessment & Plan:    See Problem List for Assessment and Plan of chronic medical problems.

## 2018-11-10 ENCOUNTER — Encounter: Payer: Self-pay | Admitting: Internal Medicine

## 2018-11-14 ENCOUNTER — Other Ambulatory Visit: Payer: Self-pay | Admitting: Internal Medicine

## 2018-11-24 ENCOUNTER — Other Ambulatory Visit: Payer: Self-pay | Admitting: Internal Medicine

## 2018-12-11 ENCOUNTER — Other Ambulatory Visit: Payer: Self-pay | Admitting: Internal Medicine

## 2018-12-14 ENCOUNTER — Other Ambulatory Visit: Payer: Self-pay | Admitting: Internal Medicine

## 2018-12-14 NOTE — Telephone Encounter (Signed)
Last OV 11/10/18 Next OV 02/09/19 Last RF 11/16/18

## 2019-01-10 ENCOUNTER — Other Ambulatory Visit: Payer: Self-pay | Admitting: Internal Medicine

## 2019-01-23 ENCOUNTER — Other Ambulatory Visit: Payer: Self-pay | Admitting: Internal Medicine

## 2019-02-08 NOTE — Patient Instructions (Addendum)
  Tests ordered today. Your results will be released to [place] (or called to you) after review.  If any changes need to be made, you will be notified at that same time.   Flu and pneumonia immunization administered today.   Medications reviewed and updated.  Changes include :   Lyrica increased to 75 mg three times a day.  Your prescription(s) have been submitted to your pharmacy. Please take as directed and contact our office if you believe you are having problem(s) with the medication(s).   Please followup in 3 months

## 2019-02-08 NOTE — Progress Notes (Signed)
Subjective:    Patient ID: Brandon Schaefer, male    DOB: 1956-03-16, 63 y.o.   MRN: [id number]  HPI The patient is here for follow up.  He is not exercising regularly.    Diabetes: He is taking his medication daily as prescribed. He is compliant with a diabetic diet.  He monitors his sugars and they have been running 180-250. He checks his feet daily and denies foot lesions. He is not up-to-date with an ophthalmology examination.   Hypertension: He is taking his medication daily. He is compliant with a low sodium diet.  He denies chest pain, palpitations, edema, shortness of breath and regular headaches.     Hyperlipidemia: He is taking his medication daily. He is compliant with a low fat/cholesterol diet. He denies myalgias.   Anxiety: we stopped the sertraline and started cymbalta 6 months ago.  He is taking his medication daily as prescribed. He denies any side effects from the medication. He feels his anxiety is well controlled and he is happy with his current dose of medication.   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  He is taking lyrica.  His symptoms are not ideally controlled.    Medications and allergies reviewed with patient and updated if appropriate.  Patient Active Problem List   Diagnosis Date Noted  . Tremor 01/27/2018  . Right leg injury 10/27/2017  . Dental infection 07/28/2017  . Sepsis due to cellulitis ([place]) 11/26/2015  . Right medial malleolar fracture 11/26/2015  . Anxiety 11/26/2015  . Hyperlipidemia 11/26/2015  . Bilateral leg edema 08/22/2015  . Back pain 03/20/2015  . Anemia, iron deficiency 05/05/2012  . DM (diabetes mellitus) ([place]) 01/18/2012  . HTN (hypertension) 01/18/2012  . Obesity 01/17/2012  . Cancer of sigmoid colon ([place]) 12/23/2011  . Arthritis of knee, degenerative 08/09/2011  . Peripheral nerve disease 01/01/2006    Current Outpatient Medications on File Prior to Visit  Medication Sig Dispense Refill  .  aspirin EC 81 MG tablet Take 81 mg by mouth daily.    . carvedilol (COREG) 6.25 MG tablet TAKE 1 TABLET(6.25 MG) BY MOUTH TWICE DAILY WITH A MEAL 180 tablet 1  . CONTOUR NEXT TEST test strip 3 (three) times daily. as directed  2  . DULoxetine (CYMBALTA) 30 MG capsule TAKE 1 CAPSULE(30 MG) BY MOUTH DAILY 90 capsule 0  . insulin lispro protamine-lispro (HUMALOG MIX 75/25) (75-25) 100 UNIT/ML SUSP injection INJECT 35 UNITS UNDER THE SKIN EVERY MORNING AND 30 UNITS AT NIGHT 50 mL 3  . meloxicam (MOBIC) 15 MG tablet TAKE 1 TABLET(15 MG) BY MOUTH DAILY 90 tablet 1  . metFORMIN (GLUCOPHAGE) 1000 MG tablet TAKE 1 TABLET(1000 MG) BY MOUTH TWICE DAILY WITH A MEAL 180 tablet 1  . ONGLYZA 5 MG TABS tablet TAKE 1 TABLET(5 MG) BY MOUTH DAILY 90 tablet 1  . pregabalin (LYRICA) 50 MG capsule TAKE 1 CAPSULE(50 MG) BY MOUTH THREE TIMES DAILY 90 capsule 1  . ramipril (ALTACE) 10 MG capsule TAKE 1 CAPSULE(10 MG) BY MOUTH DAILY BEFORE BREAKFAST 90 capsule 1  . rosuvastatin (CRESTOR) 20 MG tablet TAKE 1 TABLET(20 MG) BY MOUTH DAILY BEFORE BREAKFAST 90 tablet 1   No current facility-administered medications on file prior to visit.     Past Medical History:  Diagnosis Date  . Allergy   . Anemia   . Anxiety   . colon ca dx'd 11/2011  . Dental abscess 08/03/2015  . Diabetes mellitus   .  Headache(784.0)   . History of blood transfusion   . Hyperlipidemia   . Hypertension   . Neuromuscular disorder ([place])    peripheral neuropathy  . Shortness of breath    with exertion    Past Surgical History:  Procedure Laterality Date  . COMPLEX WOUND CLOSURE N/A 01/06/2013   Procedure: EXCISION CHRONIC ABDOMINAL WOUND;  Surgeon: Harl Bowie, MD;  Location: [facility];  Service: General;  Laterality: N/A;  . CYSTOSCOPY W/ URETERAL STENT PLACEMENT Bilateral 01/04/2013   Procedure: CYSTOSCOPY WITH RETROGRADE PYELOGRAM/Right double J URETERAL STENT PLACEMENT;  Surgeon: Alexis Frock, MD;  Location[facility];  Service:  Urology;  Laterality: Bilateral;  . CYSTOSCOPY WITH RETROGRADE PYELOGRAM, URETEROSCOPY AND STENT PLACEMENT Right 01/06/2013   Procedure: CYSTOSCOPY WITH RIGHT RETROGRADE PYELOGRAM, URETEROSCOPY AND BILATERAL STENT EXCHANGE, BILATERAL STONE BASKET EXTRACTION ;  Surgeon: Alexis Frock, MD;  [facility];  Service: Urology;  Laterality: Right;  . FRACTURE SURGERY      ORIF-left radius has pin  . HOLMIUM LASER APPLICATION N/A [id number]   Procedure: HOLMIUM LASER APPLICATION;  Surgeon: Alexis Frock, MD;  [facility];  Service: Urology;  Laterality: N/A;  . NEPHROLITHOTOMY Left 01/04/2013   Procedure: NEPHROLITHOTOMY PERCUTANEOUS  LEFT 1ST STAGE PERCUTANEOUS NEPHROSTOLITHOTOMY;  Surgeon: Alexis Frock, MD;  [facility];  Service: Urology;  Laterality: Left;  . NEPHROLITHOTOMY Left 01/06/2013   Procedure: NEPHROLITHOTOMY PERCUTANEOUS SECOND LOOK;  Surgeon: Alexis Frock, MD;  [facility];  Service: Urology;  Laterality: Left;  . PARTIAL COLECTOMY  01/17/2012   Procedure: PARTIAL COLECTOMY;  Surgeon: Harl Bowie, MD;  [facility];  Service: General;;  . PILONIDAL CYST EXCISION    . PORT-A-CATH REMOVAL Left 01/06/2013   Procedure: REMOVAL PORT-A-CATH;  Surgeon: Harl Bowie, MD;  [facility];  Service: General;  Laterality: Left;  . PORTACATH PLACEMENT  03/19/2012   Procedure: INSERTION PORT-A-CATH;  Surgeon: Harl Bowie, MD;  Location: [facility];  Service: General;  Laterality: N/A;  . PROCTOSCOPY  01/17/2012   Procedure: PROCTOSCOPY;  Surgeon: Harl Bowie, MD;  [facility];  Service: General;;    Social History   Socioeconomic History  . Marital status: Single    Spouse name: Not on file  . Number of children: Not on file  . Years of education: Not on file  . Highest education level: Not on file  Occupational History  . Not on file  Social Needs  . Financial resource strain: Not on file  . Food insecurity    Worry:  Not on file    Inability: Not on file  . Transportation needs    Medical: Not on file    Non-medical: Not on file  Tobacco Use  . Smoking status: Never Smoker  . Smokeless tobacco: Never Used  . Tobacco comment: NEVER USED TOBACC0  Substance and Sexual Activity  . Alcohol use: No    Alcohol/week: 0.0 standard drinks  . Drug use: No  . Sexual activity: Not on file  Lifestyle  . Physical activity    Days per week: Not on file    Minutes per session: Not on file  . Stress: Not on file  Relationships  . Social Herbalist on phone: Not on file    Gets together: Not on file    Attends religious service: Not on file    Active member of club or organization: Not on file    Attends meetings of  clubs or organizations: Not on file    Relationship status: Not on file  Other Topics Concern  . Not on file  Social History Narrative  . Not on file    Family History  Problem Relation Age of Onset  . Diabetes Father   . Cancer Maternal Aunt        colon  . Cancer Maternal Grandmother        colon    Review of Systems  Constitutional: Negative for chills and fever.  Respiratory: Negative for cough, shortness of breath and wheezing.   Cardiovascular: Negative for chest pain, palpitations and leg swelling.  Musculoskeletal: Positive for arthralgias (knees, shoulders, ankles, hands, back) and joint swelling.  Neurological: Positive for numbness and headaches (sinus headaches). Negative for light-headedness.  Psychiatric/Behavioral: Negative for dysphoric mood. The patient is nervous/anxious (controlled).        Objective:   Vitals:   02/09/19 0936  BP: 132/60  Pulse: (!) 104  Resp: 18  Temp: 98.2 F (36.8 C)  SpO2: 96%   BP Readings from Last 3 Encounters:  02/09/19 132/60  11/09/18 (!) 158/70  01/27/18 (!) 152/74   Wt Readings from Last 3 Encounters:  02/09/19 (!) 377 lb (171 kg)  11/09/18 (!) 377 lb (171 kg)  01/27/18 (!) 363 lb (164.7 kg)   Body mass  index is 51.13 kg/m.   Physical Exam    Constitutional: Appears well-developed and well-nourished. No distress.  HENT:  Head: Normocephalic and atraumatic.  Neck: Neck supple. No tracheal deviation present. No thyromegaly present.  No cervical lymphadenopathy Cardiovascular: Normal rate, regular rhythm and normal heart sounds.  No murmur heard. No carotid bruit .  No edema Pulmonary/Chest: Effort normal and breath sounds normal. No respiratory distress. No has no wheezes. No rales.  Skin: Skin is warm and dry. Not diaphoretic.  Psychiatric: Normal mood and affect. Behavior is normal.      Assessment & Plan:    See Problem List for Assessment and Plan of chronic medical problems.

## 2019-02-09 ENCOUNTER — Encounter: Payer: Self-pay | Admitting: Internal Medicine

## 2019-02-09 ENCOUNTER — Ambulatory Visit (INDEPENDENT_AMBULATORY_CARE_PROVIDER_SITE_OTHER): Payer: Commercial Indemnity | Admitting: Internal Medicine

## 2019-02-09 ENCOUNTER — Other Ambulatory Visit (INDEPENDENT_AMBULATORY_CARE_PROVIDER_SITE_OTHER): Payer: Commercial Indemnity

## 2019-02-09 ENCOUNTER — Other Ambulatory Visit: Payer: Self-pay

## 2019-02-09 VITALS — BP 132/60 | HR 104 | Temp 98.2°F | Resp 18 | Ht 72.0 in | Wt 377.0 lb

## 2019-02-09 DIAGNOSIS — Z794 Long term (current) use of insulin: Secondary | ICD-10-CM

## 2019-02-09 DIAGNOSIS — I10 Essential (primary) hypertension: Secondary | ICD-10-CM

## 2019-02-09 DIAGNOSIS — E7849 Other hyperlipidemia: Secondary | ICD-10-CM

## 2019-02-09 DIAGNOSIS — E119 Type 2 diabetes mellitus without complications: Secondary | ICD-10-CM | POA: Diagnosis not present

## 2019-02-09 DIAGNOSIS — F419 Anxiety disorder, unspecified: Secondary | ICD-10-CM

## 2019-02-09 DIAGNOSIS — Z23 Encounter for immunization: Secondary | ICD-10-CM

## 2019-02-09 DIAGNOSIS — G629 Polyneuropathy, unspecified: Secondary | ICD-10-CM | POA: Diagnosis not present

## 2019-02-09 LAB — COMPREHENSIVE METABOLIC PANEL
ALT: 16 U/L (ref 0–53)
AST: 12 U/L (ref 0–37)
Albumin: 4.1 g/dL (ref 3.5–5.2)
Alkaline Phosphatase: 97 U/L (ref 39–117)
BUN: 21 mg/dL (ref 6–23)
CO2: 22 mEq/L (ref 19–32)
Calcium: 9.1 mg/dL (ref 8.4–10.5)
Chloride: 104 mEq/L (ref 96–112)
Creatinine, Ser: 0.89 mg/dL (ref 0.40–1.50)
GFR: 85.78 mL/min (ref >60.00)
Glucose, Bld: 286 mg/dL — ABNORMAL HIGH (ref 70–99)
Potassium: 4.1 mEq/L (ref 3.5–5.1)
Sodium: 137 mEq/L (ref 135–145)
Total Bilirubin: 0.6 mg/dL (ref 0.2–1.2)
Total Protein: 6.8 g/dL (ref 6.0–8.3)

## 2019-02-09 LAB — CBC WITH DIFFERENTIAL/PLATELET
Basophils Absolute: 0.1 10*3/uL (ref 0.0–0.1)
Basophils Relative: 0.7 % (ref 0.0–3.0)
Eosinophils Absolute: 0.4 10*3/uL (ref 0.0–0.7)
Eosinophils Relative: 3.9 % (ref 0.0–5.0)
HCT: 43.8 % (ref 39.0–52.0)
Hemoglobin: 14.9 g/dL (ref 13.0–17.0)
Lymphocytes Relative: 18.5 % (ref 12.0–46.0)
Lymphs Abs: 2 10*3/uL (ref 0.7–4.0)
MCHC: 34 g/dL (ref 30.0–36.0)
MCV: 82.5 fl (ref 78.0–100.0)
Monocytes Absolute: 0.8 10*3/uL (ref 0.1–1.0)
Monocytes Relative: 7.2 % (ref 3.0–12.0)
Neutro Abs: 7.5 10*3/uL (ref 1.4–7.7)
Neutrophils Relative %: 69.7 % (ref 43.0–77.0)
Platelets: 258 10*3/uL (ref 150.0–400.0)
RBC: 5.31 Mil/uL (ref 4.22–5.81)
RDW: 14 % (ref 11.5–15.5)
WBC: 10.8 10*3/uL — ABNORMAL HIGH (ref 4.0–10.5)

## 2019-02-09 LAB — LIPID PANEL
Cholesterol: 106 mg/dL (ref 0–200)
HDL: 36.7 mg/dL — ABNORMAL LOW (ref >39.00)
LDL Cholesterol: 30 mg/dL (ref 0–99)
NonHDL: 69.21
Total CHOL/HDL Ratio: 3
Triglycerides: 195 mg/dL — ABNORMAL HIGH (ref 0.0–149.0)
VLDL: 39 mg/dL (ref 0.0–40.0)

## 2019-02-09 LAB — HEMOGLOBIN A1C: Hgb A1c MFr Bld: 8.3 % — ABNORMAL HIGH (ref 4.6–6.5)

## 2019-02-09 MED ORDER — CONTOUR NEXT TEST VI STRP: ORAL_STRIP | 2 refills | Status: DC

## 2019-02-09 MED ORDER — PREGABALIN 75 MG PO CAPS: 75.0000 mg | ORAL_CAPSULE | Freq: Two times a day (BID) | ORAL | 3 refills | Status: DC

## 2019-02-09 NOTE — Assessment & Plan Note (Signed)
Check lipid panel  Continue daily statin Regular exercise and healthy diet encouraged  

## 2019-02-09 NOTE — Assessment & Plan Note (Signed)
a1c Sugars not ideally controlled at home Continue metformin May need to adjust insulin if needed

## 2019-02-09 NOTE — Assessment & Plan Note (Signed)
Controlled, stable - denies any anxiety Continue current dose of medication, cymbalta 30 mg daily

## 2019-02-09 NOTE — Assessment & Plan Note (Signed)
BP well controlled Current regimen effective and well tolerated Continue current medications at current doses Cmp, cbc 

## 2019-02-09 NOTE — Assessment & Plan Note (Addendum)
Taking cymbalta 30 mg daily Taking lyrica 50 mg three times a day -- will increase lyrica to 75 mg three times a day Check B12

## 2019-02-11 LAB — VITAMIN B12: Vitamin B-12: 141 pg/mL — ABNORMAL LOW (ref 211–911)

## 2019-02-12 ENCOUNTER — Other Ambulatory Visit: Payer: Self-pay | Admitting: Internal Medicine

## 2019-02-12 MED ORDER — HUMALOG MIX 75/25 (75-25) 100 UNIT/ML ~~LOC~~ SUSP: SUBCUTANEOUS | 3 refills | Status: DC

## 2019-02-12 NOTE — Telephone Encounter (Signed)
Response to recent lab results.

## 2019-02-15 ENCOUNTER — Telehealth: Payer: Self-pay | Admitting: Internal Medicine

## 2019-02-15 NOTE — Telephone Encounter (Signed)
Rx updated. Pt is aware.

## 2019-02-15 NOTE — Telephone Encounter (Signed)
Patient called back.  I have given him Dr. Quay Burow response on increasing his insulin.  Patient states he will try this.  He wants to make sure that he has an update script for his insulin sent to his pharmacy.

## 2019-02-18 ENCOUNTER — Encounter: Payer: Self-pay | Admitting: Internal Medicine

## 2019-03-24 ENCOUNTER — Other Ambulatory Visit: Payer: Self-pay | Admitting: Internal Medicine

## 2019-04-22 ENCOUNTER — Other Ambulatory Visit: Payer: Self-pay | Admitting: Internal Medicine

## 2019-05-03 ENCOUNTER — Other Ambulatory Visit: Payer: Self-pay | Admitting: Internal Medicine

## 2019-05-11 DIAGNOSIS — E538 Deficiency of other specified B group vitamins: Secondary | ICD-10-CM | POA: Insufficient documentation

## 2019-05-11 NOTE — Progress Notes (Signed)
Subjective:    Patient ID: Brandon Schaefer, male    DOB: 06-03-1955, 64 y.o.   MRN: [id number]  HPI The patient is here for follow up of their chronic medical problems, including diabetes, hypertension, hyperlipidemia, anxiety, vitamin B12 deficiency and peripheral neuropathy secondary to chemo and diabetes.  He is taking all of his medications as prescribed.   He is not exercising regularly.  He has been compliant with a diabetic diet.  His sugars have been low 200's.  The lowest sugar he saw was 135.   His BP is 120-130/70-80.  He thinks his blood pressure was high here today most likely because he walked in and his blood pressure was taken right away.   Medications and allergies reviewed with patient and updated if appropriate.  Patient Active Problem List   Diagnosis Date Noted  . B12 deficiency 05/11/2019  . Tremor 01/27/2018  . Right leg injury 10/27/2017  . Dental infection 07/28/2017  . Sepsis due to cellulitis ([place]) 11/26/2015  . Right medial malleolar fracture 11/26/2015  . Anxiety 11/26/2015  . Hyperlipidemia 11/26/2015  . Bilateral leg edema 08/22/2015  . Back pain 03/20/2015  . Anemia, iron deficiency 05/05/2012  . DM (diabetes mellitus) ([place]) 01/18/2012  . HTN (hypertension) 01/18/2012  . Obesity 01/17/2012  . Cancer of sigmoid colon ([place]) 12/23/2011  . Arthritis of knee, degenerative 08/09/2011  . Peripheral nerve disease 01/01/2006    Current Outpatient Medications on File Prior to Visit  Medication Sig Dispense Refill  . aspirin EC 81 MG tablet Take 81 mg by mouth daily.    . carvedilol (COREG) 6.25 MG tablet TAKE 1 TABLET(6.25 MG) BY MOUTH TWICE DAILY WITH A MEAL 180 tablet 1  . CONTOUR NEXT TEST test strip Use to check blood sugars 3 times daily as directed. DX CODE- E11.9 300 each 2  . DULoxetine (CYMBALTA) 30 MG capsule TAKE 1 CAPSULE(30 MG) BY MOUTH DAILY 90 capsule 0  . insulin lispro protamine-lispro (HUMALOG MIX 75/25) (75-25) 100 UNIT/ML SUSP  injection INJECT 38 UNITS UNDER THE SKIN EVERY MORNING AND 33 UNITS AT NIGHT 60 mL 3  . meloxicam (MOBIC) 15 MG tablet TAKE 1 TABLET(15 MG) BY MOUTH DAILY 90 tablet 1  . metFORMIN (GLUCOPHAGE) 1000 MG tablet TAKE 1 TABLET(1000 MG) BY MOUTH TWICE DAILY WITH A MEAL 180 tablet 1  . ONGLYZA 5 MG TABS tablet TAKE 1 TABLET(5 MG) BY MOUTH DAILY 90 tablet 1  . pregabalin (LYRICA) 75 MG capsule Take 1 capsule (75 mg total) by mouth 2 (two) times daily. 90 capsule 3  . ramipril (ALTACE) 10 MG capsule TAKE 1 CAPSULE(10 MG) BY MOUTH DAILY BEFORE BREAKFAST 90 capsule 1  . rosuvastatin (CRESTOR) 20 MG tablet TAKE 1 TABLET(20 MG) BY MOUTH DAILY BEFORE BREAKFAST 90 tablet 1   No current facility-administered medications on file prior to visit.    Past Medical History:  Diagnosis Date  . Allergy   . Anemia   . Anxiety   . colon ca dx'd 11/2011  . Dental abscess 08/03/2015  . Diabetes mellitus   . Headache(784.0)   . History of blood transfusion   . Hyperlipidemia   . Hypertension   . Neuromuscular disorder ([place])    peripheral neuropathy  . Shortness of breath    with exertion    Past Surgical History:  Procedure Laterality Date  . COMPLEX WOUND CLOSURE N/A 01/06/2013   Procedure: EXCISION CHRONIC ABDOMINAL WOUND;  Surgeon: Harl Bowie, MD;  [facility];  Service: General;  Laterality: N/A;  . CYSTOSCOPY W/ URETERAL STENT PLACEMENT Bilateral 01/04/2013   Procedure: CYSTOSCOPY WITH RETROGRADE PYELOGRAM/Right double J URETERAL STENT PLACEMENT;  Surgeon: Alexis Frock, MD;  [facility];  Service: Urology;  Laterality: Bilateral;  . CYSTOSCOPY WITH RETROGRADE PYELOGRAM, URETEROSCOPY AND STENT PLACEMENT Right 01/06/2013   Procedure: CYSTOSCOPY WITH RIGHT RETROGRADE PYELOGRAM, URETEROSCOPY AND BILATERAL STENT EXCHANGE, BILATERAL STONE BASKET EXTRACTION ;  Surgeon: Alexis Frock, MD;  [facility];  Service: Urology;  Laterality: Right;  . FRACTURE SURGERY      ORIF-left radius has pin   . HOLMIUM LASER APPLICATION N/A [id number]   Procedure: HOLMIUM LASER APPLICATION;  Surgeon: Alexis Frock, MD;  [facility];  Service: Urology;  Laterality: N/A;  . NEPHROLITHOTOMY Left 01/04/2013   Procedure: NEPHROLITHOTOMY PERCUTANEOUS  LEFT 1ST STAGE PERCUTANEOUS NEPHROSTOLITHOTOMY;  Surgeon: Alexis Frock, MD;  [facility];  Service: Urology;  Laterality: Left;  . NEPHROLITHOTOMY Left 01/06/2013   Procedure: NEPHROLITHOTOMY PERCUTANEOUS SECOND LOOK;  Surgeon: Alexis Frock, MD;  [facility];  Service: Urology;  Laterality: Left;  . PARTIAL COLECTOMY  01/17/2012   Procedure: PARTIAL COLECTOMY;  Surgeon: Harl Bowie, MD;  [facility];  Service: General;;  . PILONIDAL CYST EXCISION    . PORT-A-CATH REMOVAL Left 01/06/2013   Procedure: REMOVAL PORT-A-CATH;  Surgeon: Harl Bowie, MD;  [facility];  Service: General;  Laterality: Left;  . PORTACATH PLACEMENT  03/19/2012   Procedure: INSERTION PORT-A-CATH;  Surgeon: Harl Bowie, MD;  Location: [facility];  Service: General;  Laterality: N/A;  . PROCTOSCOPY  01/17/2012   Procedure: PROCTOSCOPY;  Surgeon: Harl Bowie, MD;  [facility];  Service: General;;    Social History   Socioeconomic History  . Marital status: Single    Spouse name: Not on file  . Number of children: Not on file  . Years of education: Not on file  . Highest education level: Not on file  Occupational History  . Not on file  Tobacco Use  . Smoking status: Never Smoker  . Smokeless tobacco: Never Used  . Tobacco comment: NEVER USED TOBACC0  Substance and Sexual Activity  . Alcohol use: No    Alcohol/week: 0.0 standard drinks  . Drug use: No  . Sexual activity: Not on file  Other Topics Concern  . Not on file  Social History Narrative  . Not on file   Social Determinants of Health   Financial Resource Strain:   . Difficulty of Paying Living Expenses: Not on file  Food Insecurity:   .  Worried About Charity fundraiser in the Last Year: Not on file  . Ran Out of Food in the Last Year: Not on file  Transportation Needs:   . Lack of Transportation (Medical): Not on file  . Lack of Transportation (Non-Medical): Not on file  Physical Activity:   . Days of Exercise per Week: Not on file  . Minutes of Exercise per Session: Not on file  Stress:   . Feeling of Stress : Not on file  Social Connections:   . Frequency of Communication with Friends and Family: Not on file  . Frequency of Social Gatherings with Friends and Family: Not on file  . Attends Religious Services: Not on file  . Active Member of Clubs or Organizations: Not on file  . Attends Archivist Meetings: Not on file  . Marital Status: Not on file    Family  History  Problem Relation Age of Onset  . Diabetes Father   . Cancer Maternal Aunt        colon  . Cancer Maternal Grandmother        colon    Review of Systems  Constitutional: Negative for chills and fever.  Respiratory: Negative for cough, shortness of breath and wheezing.   Cardiovascular: Negative for chest pain, palpitations and leg swelling.  Neurological: Positive for headaches (sinus ). Negative for dizziness and light-headedness.  Psychiatric/Behavioral: Negative for dysphoric mood. The patient is not nervous/anxious.        Objective:   Vitals:   05/12/19 0803  BP: (!) 146/74  Pulse: 100  Resp: 20  Temp: 98.2 F (36.8 C)  SpO2: 97%   BP Readings from Last 3 Encounters:  05/12/19 (!) 146/74  02/09/19 132/60  11/09/18 (!) 158/70   Wt Readings from Last 3 Encounters:  05/12/19 (!) 379 lb (171.9 kg)  02/09/19 (!) 377 lb (171 kg)  11/09/18 (!) 377 lb (171 kg)   Body mass index is 51.4 kg/m.   Physical Exam    Constitutional: Appears well-developed and well-nourished. No distress.  HENT:  Head: Normocephalic and atraumatic.  Neck: Neck supple. No tracheal deviation present. No thyromegaly present.  No cervical  lymphadenopathy Cardiovascular: Normal rate, regular rhythm and normal heart sounds.  No murmur heard. No carotid bruit .  No edema Pulmonary/Chest: Effort normal and breath sounds normal. No respiratory distress. No has no wheezes. No rales.  Skin: Skin is warm and dry. Not diaphoretic.  Psychiatric: Normal mood and affect. Behavior is normal.      Assessment & Plan:    See Problem List for Assessment and Plan of chronic medical problems.    This visit occurred during the SARS-CoV-2 public health emergency.  Safety protocols were in place, including screening questions prior to the visit, additional usage of staff PPE, and extensive cleaning of exam room while observing appropriate contact time as indicated for disinfecting solutions.

## 2019-05-11 NOTE — Patient Instructions (Addendum)
  Blood work was ordered.     Medications reviewed and updated.  Changes include :  Start januvia 100 mg daily.  Increase insulin to 42 units in am and  36 units in pm  Your prescription(s) have been submitted to your pharmacy. Please take as directed and contact our office if you believe you are having problem(s) with the medication(s).    Please followup in 3 months

## 2019-05-12 ENCOUNTER — Ambulatory Visit (INDEPENDENT_AMBULATORY_CARE_PROVIDER_SITE_OTHER): Payer: Medicare Other | Admitting: Internal Medicine

## 2019-05-12 ENCOUNTER — Other Ambulatory Visit: Payer: Self-pay

## 2019-05-12 ENCOUNTER — Encounter: Payer: Self-pay | Admitting: Internal Medicine

## 2019-05-12 VITALS — BP 146/74 | HR 100 | Temp 98.2°F | Resp 20 | Ht 72.0 in | Wt 379.0 lb

## 2019-05-12 DIAGNOSIS — F419 Anxiety disorder, unspecified: Secondary | ICD-10-CM

## 2019-05-12 DIAGNOSIS — G629 Polyneuropathy, unspecified: Secondary | ICD-10-CM | POA: Diagnosis not present

## 2019-05-12 DIAGNOSIS — Z794 Long term (current) use of insulin: Secondary | ICD-10-CM

## 2019-05-12 DIAGNOSIS — I10 Essential (primary) hypertension: Secondary | ICD-10-CM | POA: Diagnosis not present

## 2019-05-12 DIAGNOSIS — E7849 Other hyperlipidemia: Secondary | ICD-10-CM

## 2019-05-12 DIAGNOSIS — E538 Deficiency of other specified B group vitamins: Secondary | ICD-10-CM

## 2019-05-12 DIAGNOSIS — E119 Type 2 diabetes mellitus without complications: Secondary | ICD-10-CM

## 2019-05-12 LAB — VITAMIN B12: Vitamin B-12: 383 pg/mL (ref 211–911)

## 2019-05-12 LAB — COMPREHENSIVE METABOLIC PANEL
ALT: 16 U/L (ref 0–53)
AST: 14 U/L (ref 0–37)
Albumin: 3.9 g/dL (ref 3.5–5.2)
Alkaline Phosphatase: 85 U/L (ref 39–117)
BUN: 19 mg/dL (ref 6–23)
CO2: 25 mEq/L (ref 19–32)
Calcium: 9.1 mg/dL (ref 8.4–10.5)
Chloride: 103 mEq/L (ref 96–112)
Creatinine, Ser: 0.89 mg/dL (ref 0.40–1.50)
GFR: 85.71 mL/min (ref >60.00)
Glucose, Bld: 184 mg/dL — ABNORMAL HIGH (ref 70–99)
Potassium: 4.3 mEq/L (ref 3.5–5.1)
Sodium: 137 mEq/L (ref 135–145)
Total Bilirubin: 0.6 mg/dL (ref 0.2–1.2)
Total Protein: 6.6 g/dL (ref 6.0–8.3)

## 2019-05-12 LAB — LIPID PANEL
Cholesterol: 116 mg/dL (ref 0–200)
HDL: 36.1 mg/dL — ABNORMAL LOW (ref >39.00)
LDL Cholesterol: 54 mg/dL (ref 0–99)
NonHDL: 79.42
Total CHOL/HDL Ratio: 3
Triglycerides: 128 mg/dL (ref 0.0–149.0)
VLDL: 25.6 mg/dL (ref 0.0–40.0)

## 2019-05-12 LAB — HEMOGLOBIN A1C: Hgb A1c MFr Bld: 8.6 % — ABNORMAL HIGH (ref 4.6–6.5)

## 2019-05-12 MED ORDER — PREGABALIN 75 MG PO CAPS: 75.0000 mg | ORAL_CAPSULE | Freq: Three times a day (TID) | ORAL | 3 refills | Status: DC

## 2019-05-12 MED ORDER — HUMALOG MIX 75/25 (75-25) 100 UNIT/ML ~~LOC~~ SUSP: SUBCUTANEOUS | 3 refills | Status: DC

## 2019-05-12 MED ORDER — SITAGLIPTIN PHOSPHATE 100 MG PO TABS: 100.0000 mg | ORAL_TABLET | Freq: Every day | ORAL | 5 refills | Status: DC

## 2019-05-12 NOTE — Assessment & Plan Note (Signed)
Chronic Blood pressure slightly elevated here today, but well controlled at home Elevation today likely related to deconditioning We will continue current medications at current doses Continue to monitor at home CMP

## 2019-05-12 NOTE — Assessment & Plan Note (Signed)
Chronic Check lipid panel  Continue daily statin Regular exercise and healthy diet encouraged  

## 2019-05-12 NOTE — Assessment & Plan Note (Signed)
Chronic Stable, controlled Currently taking Cymbalta, which helps with his neuropathy and anxiety Continue Cymbalta 30 mg daily

## 2019-05-12 NOTE — Assessment & Plan Note (Signed)
Taking cymbalta 30 mg  Taking Lyrica 75 mg BID - increase to TID Medication well tolerated and helping nerve pain

## 2019-05-12 NOTE — Assessment & Plan Note (Signed)
Chronic Insulin-dependent diabetes Not currently controlled-sugars at home often in the low 200s Last A1c elevated We will check A1c today, but based on home readings will adjust medication now Start Januvia 100 mg daily Increase insulin to 42 units in the morning and 36 units at night He does monitor his insulin at home and will let me know if his sugars go too low or if they are not well controlled so that we can adjust further

## 2019-05-12 NOTE — Assessment & Plan Note (Signed)
He is taking B12 daily Will check level

## 2019-05-13 ENCOUNTER — Encounter: Payer: Self-pay | Admitting: Internal Medicine

## 2019-05-17 ENCOUNTER — Other Ambulatory Visit: Payer: Self-pay | Admitting: Internal Medicine

## 2019-06-06 ENCOUNTER — Ambulatory Visit: Payer: Medicare Other | Attending: Internal Medicine

## 2019-06-06 DIAGNOSIS — Z23 Encounter for immunization: Secondary | ICD-10-CM | POA: Insufficient documentation

## 2019-06-06 NOTE — Progress Notes (Signed)
   Covid-19 Vaccination Clinic  Name:  Brandon Schaefer    MRN: [id number] DOB: 08-Apr-1956  06/06/2019  [name] was observed post Covid-19 immunization for 15 minutes without incidence. He was provided with Vaccine Information Sheet and instruction to access the V-Safe system.   [name] was instructed to call 911 with any severe reactions post vaccine: [name] Difficulty breathing  . Swelling of your face and throat  . A fast heartbeat  . A bad rash all over your body  . Dizziness and weakness    Immunizations Administered    Name Date Dose VIS Date Route   Pfizer COVID-19 Vaccine 06/06/2019 11:53 AM 0.3 mL 03/26/2019 Intramuscular   Manufacturer: [place]   Lot: [id number]   [place]: [id number]

## 2019-06-28 ENCOUNTER — Ambulatory Visit: Payer: Medicare Other | Attending: Internal Medicine

## 2019-06-28 DIAGNOSIS — Z23 Encounter for immunization: Secondary | ICD-10-CM

## 2019-06-28 NOTE — Progress Notes (Signed)
   Covid-19 Vaccination Clinic  Name:  Brandon Schaefer    MRN: [id number] DOB: 05-07-1955  06/28/2019  [name] was observed post Covid-19 immunization for 15 minutes without incident. He was provided with Vaccine Information Sheet and instruction to access the V-Safe system.   [name] was instructed to call 911 with any severe reactions post vaccine: [name] Difficulty breathing  . Swelling of face and throat  . A fast heartbeat  . A bad rash all over body  . Dizziness and weakness   Immunizations Administered    Name Date Dose VIS Date Route   Pfizer COVID-19 Vaccine 06/28/2019 11:12 AM 0.3 mL 03/26/2019 Intramuscular   Manufacturer: [place]   Lot: [id number]   [place]: [id number]

## 2019-07-18 ENCOUNTER — Other Ambulatory Visit: Payer: Self-pay | Admitting: Internal Medicine

## 2019-07-21 ENCOUNTER — Other Ambulatory Visit: Payer: Self-pay | Admitting: Internal Medicine

## 2019-07-22 ENCOUNTER — Other Ambulatory Visit: Payer: Self-pay | Admitting: Internal Medicine

## 2019-07-22 NOTE — Telephone Encounter (Signed)
Last OV 05/12/19 Next OV 08/10/19 Last RF 07/05/19

## 2019-08-09 NOTE — Progress Notes (Signed)
Subjective:    Patient ID: Brandon Schaefer, male    DOB: [birth year], 64 y.o.   MRN: [id number]  HPI The patient is here for follow up of their chronic medical problems, including diabetes, hypertension, anxiety, B12 def, peripheral neuropathy d/t chemo and DM  He is taking all of his medications as prescribed.   At his last visit we started [place] and increased his insulin.     He has been fairly compliant with a diabetic diet.  His sugars at home range from 70-370.  The high sugars are often in the morning after breakfast, but they are not always high at that time.    BP at home - has been good.    He has gained a lot of weight.  He lost his job since he was here and he was more active at work.  He has not been moving around much.  He does not think his eating has changed much.  He does have some mild situational depression because he lost his job.  He does feel that the Cymbalta is working and does not feel that that needs to be adjusted.    Medications and allergies reviewed with patient and updated if appropriate.  Patient Active Problem List   Diagnosis Date Noted  . B12 deficiency 05/11/2019  . Tremor 01/27/2018  . Right leg injury 10/27/2017  . Right medial malleolar fracture 11/26/2015  . Anxiety 11/26/2015  . Hyperlipidemia 11/26/2015  . Bilateral leg edema 08/22/2015  . Back pain 03/20/2015  . Anemia, iron deficiency 05/05/2012  . DM (diabetes mellitus) ([place]) 01/18/2012  . HTN (hypertension) 01/18/2012  . Morbid obesity ([place]) 01/17/2012  . Cancer of sigmoid colon ([place]) 12/23/2011  . Arthritis of knee, degenerative 08/09/2011  . Peripheral nerve disease 01/01/2006    Current Outpatient Medications on File Prior to Visit  Medication Sig Dispense Refill  . aspirin EC 81 MG tablet Take 81 mg by mouth daily.    . carvedilol (COREG) 6.25 MG tablet TAKE 1 TABLET(6.25 MG) BY MOUTH TWICE DAILY WITH A MEAL 180 tablet 1  . CONTOUR NEXT TEST test strip Use to check  blood sugars 3 times daily as directed. DX CODE- E11.9 300 each 2  . DULoxetine (CYMBALTA) 30 MG capsule Take 1 capsul by mouth daily. Need office visit for more refills. 90 capsule 0  . insulin lispro protamine-lispro (HUMALOG MIX 75/25) (75-25) 100 UNIT/ML SUSP injection INJECT 42 UNITS UNDER THE SKIN EVERY MORNING AND 36 UNITS AT NIGHT 70 mL 3  . meloxicam (MOBIC) 15 MG tablet TAKE 1 TABLET(15 MG) BY MOUTH DAILY 90 tablet 1  . metFORMIN (GLUCOPHAGE) 1000 MG tablet TAKE 1 TABLET(1000 MG) BY MOUTH TWICE DAILY WITH A MEAL 180 tablet 1  . pregabalin (LYRICA) 75 MG capsule TAKE 1 CAPSULE(75 MG) BY MOUTH TWICE DAILY 180 capsule 0  . ramipril (ALTACE) 10 MG capsule TAKE 1 CAPSULE(10 MG) BY MOUTH DAILY BEFORE BREAKFAST 90 capsule 1  . rosuvastatin (CRESTOR) 20 MG tablet TAKE 1 TABLET(20 MG) BY MOUTH DAILY BEFORE BREAKFAST 90 tablet 1  . sitaGLIPtin (JANUVIA) 100 MG tablet Take 1 tablet (100 mg total) by mouth daily. 30 tablet 5   No current facility-administered medications on file prior to visit.    Past Medical History:  Diagnosis Date  . Allergy   . Anemia   . Anxiety   . colon ca dx'd 11/2011  . Dental abscess 08/03/2015  . Diabetes mellitus   . Headache(784.0)   .  History of blood transfusion   . Hyperlipidemia   . Hypertension   . Neuromuscular disorder ([place])    peripheral neuropathy  . Shortness of breath    with exertion    Past Surgical History:  Procedure Laterality Date  . COMPLEX WOUND CLOSURE N/A 01/06/2013   Procedure: EXCISION CHRONIC ABDOMINAL WOUND;  Surgeon: Harl Bowie, MD;  Location: [facility];  Service: General;  Laterality: N/A;  . CYSTOSCOPY W/ URETERAL STENT PLACEMENT Bilateral 01/04/2013   Procedure: CYSTOSCOPY WITH RETROGRADE PYELOGRAM/Right double J URETERAL STENT PLACEMENT;  Surgeon: Alexis Frock, MD;  [facility];  Service: Urology;  Laterality: Bilateral;  . CYSTOSCOPY WITH RETROGRADE PYELOGRAM, URETEROSCOPY AND STENT PLACEMENT Right 01/06/2013    Procedure: CYSTOSCOPY WITH RIGHT RETROGRADE PYELOGRAM, URETEROSCOPY AND BILATERAL STENT EXCHANGE, BILATERAL STONE BASKET EXTRACTION ;  Surgeon: Alexis Frock, MD;  [facility];  Service: Urology;  Laterality: Right;  . FRACTURE SURGERY      ORIF-left radius has pin  . HOLMIUM LASER APPLICATION N/A [id number]   Procedure: HOLMIUM LASER APPLICATION;  Surgeon: Alexis Frock, MD;  [facility];  Service: Urology;  Laterality: N/A;  . NEPHROLITHOTOMY Left 01/04/2013   Procedure: NEPHROLITHOTOMY PERCUTANEOUS  LEFT 1ST STAGE PERCUTANEOUS NEPHROSTOLITHOTOMY;  Surgeon: Alexis Frock, MD;  [facility];  Service: Urology;  Laterality: Left;  . NEPHROLITHOTOMY Left 01/06/2013   Procedure: NEPHROLITHOTOMY PERCUTANEOUS SECOND LOOK;  Surgeon: Alexis Frock, MD;  [facility];  Service: Urology;  Laterality: Left;  . PARTIAL COLECTOMY  01/17/2012   Procedure: PARTIAL COLECTOMY;  Surgeon: Harl Bowie, MD;  [facility];  Service: General;;  . PILONIDAL CYST EXCISION    . PORT-A-CATH REMOVAL Left 01/06/2013   Procedure: REMOVAL PORT-A-CATH;  Surgeon: Harl Bowie, MD;  [facility];  Service: General;  Laterality: Left;  . PORTACATH PLACEMENT  03/19/2012   Procedure: INSERTION PORT-A-CATH;  Surgeon: Harl Bowie, MD;  Location: [facility];  Service: General;  Laterality: N/A;  . PROCTOSCOPY  01/17/2012   Procedure: PROCTOSCOPY;  Surgeon: Harl Bowie, MD;  [facility];  Service: General;;    Social History   Socioeconomic History  . Marital status: Single    Spouse name: Not on file  . Number of children: Not on file  . Years of education: Not on file  . Highest education level: Not on file  Occupational History  . Not on file  Tobacco Use  . Smoking status: Never Smoker  . Smokeless tobacco: Never Used  . Tobacco comment: NEVER USED TOBACC0  Substance and Sexual Activity  . Alcohol use: No    Alcohol/week: 0.0 standard drinks  .  Drug use: No  . Sexual activity: Not on file  Other Topics Concern  . Not on file  Social History Narrative  . Not on file   Social Determinants of Health   Financial Resource Strain:   . Difficulty of Paying Living Expenses:   Food Insecurity:   . Worried About Charity fundraiser in the Last Year:   . Arboriculturist in the Last Year:   Transportation Needs:   . Film/video editor (Medical):   [name] Lack of Transportation (Non-Medical):   Physical Activity:   . Days of Exercise per Week:   . Minutes of Exercise per Session:   Stress:   . Feeling of Stress :   Social Connections:   . Frequency of Communication with Friends and Family:   . Frequency  of Social Gatherings with Friends and Family:   . Attends Religious Services:   . Active Member of Clubs or Organizations:   . Attends Archivist Meetings:   [name] Marital Status:     Family History  Problem Relation Age of Onset  . Diabetes Father   . Cancer Maternal Aunt        colon  . Cancer Maternal Grandmother        colon    Review of Systems  Constitutional: Negative for chills and fever.  Respiratory: Positive for shortness of breath (with exertion). Negative for cough and wheezing.   Cardiovascular: Positive for leg swelling (feet - worse). Negative for chest pain and palpitations.  Neurological: Negative for light-headedness and headaches (sinus).       Objective:   Vitals:   08/10/19 0748 08/10/19 0838  BP: (!) 162/64 128/64  Pulse: 84   Resp: 18   Temp: 98.6 F (37 C)   SpO2: 96%    BP Readings from Last 3 Encounters:  08/10/19 128/64  05/12/19 (!) 146/74  02/09/19 132/60   Wt Readings from Last 3 Encounters:  08/10/19 (!) 406 lb 12.8 oz (184.5 kg)  05/12/19 (!) 379 lb (171.9 kg)  02/09/19 (!) 377 lb (171 kg)   Body mass index is 55.17 kg/m.   Physical Exam    Constitutional: Appears well-developed and well-nourished. No distress.  HENT:  Head: Normocephalic and atraumatic.    Neck: Neck supple. No tracheal deviation present. No thyromegaly present.  No cervical lymphadenopathy Cardiovascular: Normal rate, regular rhythm and normal heart sounds.   No murmur heard. No carotid bruit .  1+ bilateral lower extremity edema Pulmonary/Chest: Effort normal and breath sounds normal. No respiratory distress. No has no wheezes. No rales.  Skin: Skin is warm and dry. Not diaphoretic.  Psychiatric: Normal mood and affect. Behavior is normal.      Assessment & Plan:    See Problem List for Assessment and Plan of chronic medical problems.    This visit occurred during the SARS-CoV-2 public health emergency.  Safety protocols were in place, including screening questions prior to the visit, additional usage of staff PPE, and extensive cleaning of exam room while observing appropriate contact time as indicated for disinfecting solutions.

## 2019-08-09 NOTE — Patient Instructions (Addendum)
  Your a1c was checked today.   It is 8.4%   Work on Occupational hygienist activity and weight loss.    Medications reviewed and updated.  Changes include :   none     Please followup in 3 months

## 2019-08-10 ENCOUNTER — Encounter: Payer: Self-pay | Admitting: Internal Medicine

## 2019-08-10 ENCOUNTER — Ambulatory Visit (INDEPENDENT_AMBULATORY_CARE_PROVIDER_SITE_OTHER): Payer: Medicare Other | Admitting: Internal Medicine

## 2019-08-10 ENCOUNTER — Other Ambulatory Visit: Payer: Self-pay

## 2019-08-10 VITALS — BP 128/64 | HR 84 | Temp 98.6°F | Resp 18 | Ht 72.0 in | Wt >= 6400 oz

## 2019-08-10 DIAGNOSIS — F419 Anxiety disorder, unspecified: Secondary | ICD-10-CM | POA: Diagnosis not present

## 2019-08-10 DIAGNOSIS — I10 Essential (primary) hypertension: Secondary | ICD-10-CM

## 2019-08-10 DIAGNOSIS — Z794 Long term (current) use of insulin: Secondary | ICD-10-CM

## 2019-08-10 DIAGNOSIS — E119 Type 2 diabetes mellitus without complications: Secondary | ICD-10-CM | POA: Diagnosis not present

## 2019-08-10 DIAGNOSIS — G629 Polyneuropathy, unspecified: Secondary | ICD-10-CM | POA: Diagnosis not present

## 2019-08-10 DIAGNOSIS — E538 Deficiency of other specified B group vitamins: Secondary | ICD-10-CM

## 2019-08-10 LAB — POCT GLYCOSYLATED HEMOGLOBIN (HGB A1C): Hemoglobin A1C: 8.4 % — AB (ref 4.0–5.6)

## 2019-08-10 NOTE — Assessment & Plan Note (Signed)
Chronic BP well controlled on repeat, initially was elevated because of the after walking back care with a mask on Current regimen effective and well tolerated Continue current medications at current doses

## 2019-08-10 NOTE — Assessment & Plan Note (Addendum)
Chronic a1c 8.4% here today Not ideally controlled Will start moving more, work on weight loss Better sugar/carb control We will hold off on changes medication right now because he is having some low sugars-if A1c still above 8 in 3 months we will need to adjust insulin or consider referral to endocrinologist F/u in 3 months

## 2019-08-10 NOTE — Assessment & Plan Note (Signed)
Taking b12 daily

## 2019-08-10 NOTE — Assessment & Plan Note (Signed)
Chronic He has had significant weight gain in the past 3 months.  Stressed weight loss Encouraged him to start walking more-he needs to be more active Decrease portions, low sugar/carbohydrate diet Follow-up in 3 months

## 2019-08-10 NOTE — Assessment & Plan Note (Signed)
Chronic Taking lyrica and cymbalta which are effective Continue at current doses

## 2019-08-10 NOTE — Assessment & Plan Note (Signed)
Chronic Controlled, stable Continue current dose of medication cymbalta 30 mg daily 

## 2019-08-17 ENCOUNTER — Telehealth: Payer: Self-pay | Admitting: Internal Medicine

## 2019-08-17 MED ORDER — HUMALOG MIX 75/25 (75-25) 100 UNIT/ML ~~LOC~~ SUSP: SUBCUTANEOUS | 0 refills | Status: DC

## 2019-08-17 NOTE — Telephone Encounter (Signed)
Medication sent.

## 2019-08-17 NOTE — Telephone Encounter (Signed)
New Message:    1.Medication Requested: insulin lispro protamine-lispro (HUMALOG MIX 75/25) (75-25) 100 UNIT/ML SUSP injection/ Insulin syringes also 2. Pharmacy (Name, Street, [place]): [place], [place] 3. On Med List: Yes  4. Last Visit with PCP: 08/10/19  5. Next visit date with PCP: 11/10/19  Pt states that a update was supposed to be made to his insulin intake Agent: Please be advised that RX refills may take up to 3 business days. We ask that you follow-up with your pharmacy.

## 2019-09-09 ENCOUNTER — Other Ambulatory Visit: Payer: Self-pay | Admitting: Internal Medicine

## 2019-09-30 ENCOUNTER — Telehealth: Payer: Self-pay

## 2019-09-30 MED ORDER — PREGABALIN 75 MG PO CAPS: ORAL_CAPSULE | ORAL | 1 refills | Status: DC

## 2019-09-30 NOTE — Telephone Encounter (Signed)
Done erx 

## 2019-09-30 NOTE — Telephone Encounter (Signed)
Please clarify the direction of medication   1.Medication Requested:pregabalin (LYRICA) 75 MG capsule   2. Pharmacy (Name, Street, City):[facility] [place], [place]  3. On Med List: Yes   4. Last Visit with PCP: 4.27.21   5. Next visit date with PCP: 7.28.21    Agent: Please be advised that RX refills may take up to 3 business days. We ask that you follow-up with your pharmacy.

## 2019-10-12 ENCOUNTER — Other Ambulatory Visit (HOSPITAL_COMMUNITY): Payer: Self-pay | Admitting: Urology

## 2019-10-12 ENCOUNTER — Other Ambulatory Visit: Payer: Self-pay | Admitting: Urology

## 2019-10-12 DIAGNOSIS — N202 Calculus of kidney with calculus of ureter: Secondary | ICD-10-CM

## 2019-10-18 ENCOUNTER — Other Ambulatory Visit: Payer: Self-pay | Admitting: Internal Medicine

## 2019-10-20 ENCOUNTER — Encounter (HOSPITAL_COMMUNITY): Payer: Self-pay

## 2019-10-20 ENCOUNTER — Ambulatory Visit (HOSPITAL_COMMUNITY): Payer: Medicare Other

## 2019-10-21 ENCOUNTER — Other Ambulatory Visit: Payer: Self-pay | Admitting: Internal Medicine

## 2019-11-04 ENCOUNTER — Other Ambulatory Visit: Payer: Self-pay | Admitting: Internal Medicine

## 2019-11-09 ENCOUNTER — Other Ambulatory Visit: Payer: Self-pay | Admitting: Internal Medicine

## 2019-11-09 ENCOUNTER — Encounter: Payer: Self-pay | Admitting: Internal Medicine

## 2019-11-09 NOTE — Progress Notes (Signed)
Subjective:    Patient ID: Brandon Schaefer, male    DOB: Aug 18, 1955, 64 y.o.   MRN: [id number]  HPI The patient is here for follow up of their chronic medical problems, including DM, htn, anxiety, B12 def, peripheral neuropathy d/t chemo/ DM  He is taking all of his medications as prescribed.    He is doing some walking. He has been working hard to lose weight.    Sugars 120 when good, bad 310.  Once or twice a week his sugars are close to 300.    Takes his sugar about two hours after dinner.   He takes his insulin at that time.  Red area on RLE - has been there for years - since he had surgery  - he had an infection there at that time.  He was told it would never go away.[name]  He will get white spots and will get clear fluid from those areas at times.  He is worried about getting another infection.   Medications and allergies reviewed with patient and updated if appropriate.  Patient Active Problem List   Diagnosis Date Noted  . History of nephrolithiasis 11/10/2019  . B12 deficiency 05/11/2019  . Tremor 01/27/2018  . Right medial malleolar fracture 11/26/2015  . Anxiety 11/26/2015  . Hyperlipidemia 11/26/2015  . Bilateral leg edema 08/22/2015  . Back pain 03/20/2015  . Anemia, iron deficiency 05/05/2012  . DM (diabetes mellitus) ([place]) 01/18/2012  . HTN (hypertension) 01/18/2012  . Morbid obesity ([place]) 01/17/2012  . Cancer of sigmoid colon ([place]) 12/23/2011  . Arthritis of knee, degenerative 08/09/2011  . Peripheral nerve disease 01/01/2006    Current Outpatient Medications on File Prior to Visit  Medication Sig Dispense Refill  . aspirin EC 81 MG tablet Take 81 mg by mouth daily.    . carvedilol (COREG) 6.25 MG tablet TAKE 1 TABLET(6.25 MG) BY MOUTH TWICE DAILY WITH A MEAL 180 tablet 1  . CONTOUR NEXT TEST test strip Use to check blood sugars 3 times daily as directed. DX CODE- E11.9 300 each 2  . DULoxetine (CYMBALTA) 30 MG capsule TAKE 1 CAPSULE BY MOUTH DAILY 90  capsule 0  . insulin lispro protamine-lispro (HUMALOG MIX 75/25) (75-25) 100 UNIT/ML SUSP injection INJECT 42 UNITS INTO THE SKIN EVERY MORNING AND 36 UNITS AT NIGHT 70 mL 0  . meloxicam (MOBIC) 15 MG tablet TAKE 1 TABLET(15 MG) BY MOUTH DAILY 90 tablet 1  . metFORMIN (GLUCOPHAGE) 1000 MG tablet TAKE 1 TABLET(1000 MG) BY MOUTH TWICE DAILY WITH A MEAL 180 tablet 1  . ramipril (ALTACE) 10 MG capsule TAKE 1 CAPSULE(10 MG) BY MOUTH DAILY BEFORE BREAKFAST 90 capsule 1  . rosuvastatin (CRESTOR) 20 MG tablet TAKE 1 TABLET(20 MG) BY MOUTH DAILY BEFORE BREAKFAST 90 tablet 1  . sitaGLIPtin (JANUVIA) 100 MG tablet Take 1 tablet (100 mg total) by mouth daily. 30 tablet 5   No current facility-administered medications on file prior to visit.    Past Medical History:  Diagnosis Date  . Allergy   . Anemia   . Anxiety   . colon ca dx'd 11/2011  . Dental abscess 08/03/2015  . Diabetes mellitus   . Headache(784.0)   . History of blood transfusion   . Hyperlipidemia   . Hypertension   . Neuromuscular disorder ([place])    peripheral neuropathy  . Shortness of breath    with exertion    Past Surgical History:  Procedure Laterality Date  . COMPLEX WOUND CLOSURE N/A  01/06/2013   Procedure: EXCISION CHRONIC ABDOMINAL WOUND;  Surgeon: Harl Bowie, MD;  [facility];  Service: General;  Laterality: N/A;  . CYSTOSCOPY W/ URETERAL STENT PLACEMENT Bilateral 01/04/2013   Procedure: CYSTOSCOPY WITH RETROGRADE PYELOGRAM/Right double J URETERAL STENT PLACEMENT;  Surgeon: Alexis Frock, MD;  [facility];  Service: Urology;  Laterality: Bilateral;  . CYSTOSCOPY WITH RETROGRADE PYELOGRAM, URETEROSCOPY AND STENT PLACEMENT Right 01/06/2013   Procedure: CYSTOSCOPY WITH RIGHT RETROGRADE PYELOGRAM, URETEROSCOPY AND BILATERAL STENT EXCHANGE, BILATERAL STONE BASKET EXTRACTION ;  Surgeon: Alexis Frock, MD;  [facility];  Service: Urology;  Laterality: Right;  . FRACTURE SURGERY      ORIF-left radius has  pin  . HOLMIUM LASER APPLICATION N/A 08/18/3974   Procedure: HOLMIUM LASER APPLICATION;  Surgeon: Alexis Frock, MD;  [facility];  Service: Urology;  Laterality: N/A;  . NEPHROLITHOTOMY Left 01/04/2013   Procedure: NEPHROLITHOTOMY PERCUTANEOUS  LEFT 1ST STAGE PERCUTANEOUS NEPHROSTOLITHOTOMY;  Surgeon: Alexis Frock, MD;  [facility];  Service: Urology;  Laterality: Left;  . NEPHROLITHOTOMY Left 01/06/2013   Procedure: NEPHROLITHOTOMY PERCUTANEOUS SECOND LOOK;  Surgeon: Alexis Frock, MD;  [facility];  Service: Urology;  Laterality: Left;  . PARTIAL COLECTOMY  01/17/2012   Procedure: PARTIAL COLECTOMY;  Surgeon: Harl Bowie, MD;  [facility];  Service: General;;  . PILONIDAL CYST EXCISION    . PORT-A-CATH REMOVAL Left 01/06/2013   Procedure: REMOVAL PORT-A-CATH;  Surgeon: Harl Bowie, MD;  [facility];  Service: General;  Laterality: Left;  . PORTACATH PLACEMENT  03/19/2012   Procedure: INSERTION PORT-A-CATH;  Surgeon: Harl Bowie, MD;  Location: [facility];  Service: General;  Laterality: N/A;  . PROCTOSCOPY  01/17/2012   Procedure: PROCTOSCOPY;  Surgeon: Harl Bowie, MD;  [facility];  Service: General;;    Social History   Socioeconomic History  . Marital status: Single    Spouse name: Not on file  . Number of children: Not on file  . Years of education: Not on file  . Highest education level: Not on file  Occupational History  . Not on file  Tobacco Use  . Smoking status: Never Smoker  . Smokeless tobacco: Never Used  . Tobacco comment: NEVER USED TOBACC0  Substance and Sexual Activity  . Alcohol use: No    Alcohol/week: 0.0 standard drinks  . Drug use: No  . Sexual activity: Not on file  Other Topics Concern  . Not on file  Social History Narrative  . Not on file   Social Determinants of Health   Financial Resource Strain:   . Difficulty of Paying Living Expenses:   Food Insecurity:   . Worried  About Charity fundraiser in the Last Year:   . Arboriculturist in the Last Year:   Transportation Needs:   . Film/video editor (Medical):   [name] Lack of Transportation (Non-Medical):   Physical Activity:   . Days of Exercise per Week:   . Minutes of Exercise per Session:   Stress:   . Feeling of Stress :   Social Connections:   . Frequency of Communication with Friends and Family:   . Frequency of Social Gatherings with Friends and Family:   . Attends Religious Services:   . Active Member of Clubs or Organizations:   . Attends Archivist Meetings:   [name] Marital Status:     Family History  Problem Relation Age of Onset  .  Diabetes Father   . Cancer Maternal Aunt        colon  . Cancer Maternal Grandmother        colon    Review of Systems  Constitutional: Negative for chills and fever.  Respiratory: Negative for shortness of breath.   Cardiovascular: Negative for chest pain, palpitations and leg swelling.  Skin: Positive for rash (chronic).  Neurological: Negative for light-headedness and headaches.       Objective:   Vitals:   11/10/19 0751  BP: (!) 138/68  Pulse: (!) 106  Temp: 98.3 F (36.8 C)  SpO2: 95%   BP Readings from Last 3 Encounters:  11/10/19 (!) 138/68  08/10/19 128/64  05/12/19 (!) 146/74   Wt Readings from Last 3 Encounters:  11/10/19 (!) 394 lb 6.4 oz (178.9 kg)  08/10/19 (!) 406 lb 12.8 oz (184.5 kg)  05/12/19 (!) 379 lb (171.9 kg)   Body mass index is 53.49 kg/m.   Physical Exam    Constitutional: Appears well-developed and well-nourished. No distress.  HENT:  Head: Normocephalic and atraumatic.  Neck: Neck supple. No tracheal deviation present. No thyromegaly present.  No cervical lymphadenopathy Cardiovascular: Normal rate, regular rhythm and normal heart sounds.   No murmur heard. No carotid bruit .  No edema Pulmonary/Chest: Effort normal and breath sounds normal. No respiratory distress. No has no wheezes. No rales.    Skin: Skin is warm and dry. Not diaphoretic. Large patch of erythema on RLE that is raised with irregular borders, not warm and no pain, a couple of small blister like lesions that are not draining present -- similar lesion on LLE - much smaller and less erythematous Psychiatric: Normal mood and affect. Behavior is normal.      Assessment & Plan:    See Problem List for Assessment and Plan of chronic medical problems.    This visit occurred during the SARS-CoV-2 public health emergency.  Safety protocols were in place, including screening questions prior to the visit, additional usage of staff PPE, and extensive cleaning of exam room while observing appropriate contact time as indicated for disinfecting solutions.

## 2019-11-09 NOTE — Patient Instructions (Addendum)
  Blood work was ordered.     Medications reviewed and updated.  Changes include :   Increase lyrica to three times a day  Your prescription(s) have been submitted to your pharmacy. Please take as directed and contact our office if you believe you are having problem(s) with the medication(s).     Please followup in 3 months

## 2019-11-10 ENCOUNTER — Ambulatory Visit (INDEPENDENT_AMBULATORY_CARE_PROVIDER_SITE_OTHER): Payer: Medicare Other | Admitting: Internal Medicine

## 2019-11-10 ENCOUNTER — Other Ambulatory Visit: Payer: Self-pay

## 2019-11-10 VITALS — BP 138/68 | HR 106 | Temp 98.3°F | Ht 72.0 in | Wt 394.4 lb

## 2019-11-10 DIAGNOSIS — E7849 Other hyperlipidemia: Secondary | ICD-10-CM | POA: Diagnosis not present

## 2019-11-10 DIAGNOSIS — Z794 Long term (current) use of insulin: Secondary | ICD-10-CM

## 2019-11-10 DIAGNOSIS — I10 Essential (primary) hypertension: Secondary | ICD-10-CM | POA: Diagnosis not present

## 2019-11-10 DIAGNOSIS — F419 Anxiety disorder, unspecified: Secondary | ICD-10-CM

## 2019-11-10 DIAGNOSIS — Z87442 Personal history of urinary calculi: Secondary | ICD-10-CM | POA: Insufficient documentation

## 2019-11-10 DIAGNOSIS — G629 Polyneuropathy, unspecified: Secondary | ICD-10-CM | POA: Diagnosis not present

## 2019-11-10 DIAGNOSIS — E119 Type 2 diabetes mellitus without complications: Secondary | ICD-10-CM | POA: Diagnosis not present

## 2019-11-10 MED ORDER — PREGABALIN 75 MG PO CAPS: 75.0000 mg | ORAL_CAPSULE | Freq: Three times a day (TID) | ORAL | 1 refills | Status: DC

## 2019-11-10 MED ORDER — "INSULIN SYRINGE-NEEDLE U-100 31G X 5/16"" 1 ML MISC": 3 refills | Status: DC

## 2019-11-10 NOTE — Assessment & Plan Note (Signed)
Chronic Continue crestor

## 2019-11-10 NOTE — Assessment & Plan Note (Addendum)
Chronic Not ideally controlled - last a1c 8.4% Will recheck a1c today Start taking insulin prior to meal Continue increased walking and weight loss efforts

## 2019-11-10 NOTE — Assessment & Plan Note (Signed)
Chronic BP well controlled Current regimen effective and well tolerated Continue current medications at current doses cmp  

## 2019-11-10 NOTE — Assessment & Plan Note (Addendum)
Chronic Increase lyrica to TID - refill sent

## 2019-11-10 NOTE — Assessment & Plan Note (Signed)
Chronic Controlled, stable Continue current dose of medication cymbalta 30 mg daily

## 2019-11-10 NOTE — Assessment & Plan Note (Signed)
Last episode 6.2021

## 2019-11-11 LAB — COMPLETE METABOLIC PANEL WITH GFR
AG Ratio: 1.8 (calc) (ref 1.0–2.5)
ALT: 23 U/L (ref 9–46)
AST: 17 U/L (ref 10–35)
Albumin: 4.1 g/dL (ref 3.6–5.1)
Alkaline phosphatase (APISO): 75 U/L (ref 35–144)
BUN: 16 mg/dL (ref 7–25)
CO2: 23 mmol/L (ref 20–32)
Calcium: 9 mg/dL (ref 8.6–10.3)
Chloride: 102 mmol/L (ref 98–110)
Creat: 1.06 mg/dL (ref 0.70–1.25)
GFR, Est African American: 85 mL/min/{1.73_m2} (ref >=60)
GFR, Est Non African American: 73 mL/min/{1.73_m2} (ref >=60)
Globulin: 2.3 g/dL (calc) (ref 1.9–3.7)
Glucose, Bld: 245 mg/dL — ABNORMAL HIGH (ref 65–99)
Potassium: 4.4 mmol/L (ref 3.5–5.3)
Sodium: 137 mmol/L (ref 135–146)
Total Bilirubin: 0.7 mg/dL (ref 0.2–1.2)
Total Protein: 6.4 g/dL (ref 6.1–8.1)

## 2019-11-11 LAB — HEMOGLOBIN A1C
Hgb A1c MFr Bld: 8.4 % of total Hgb — ABNORMAL HIGH (ref <5.7)
Mean Plasma Glucose: 194 (calc)
eAG (mmol/L): 10.8 (calc)

## 2019-12-10 ENCOUNTER — Other Ambulatory Visit: Payer: Self-pay | Admitting: Internal Medicine

## 2020-01-05 ENCOUNTER — Other Ambulatory Visit: Payer: Self-pay | Admitting: Internal Medicine

## 2020-01-05 NOTE — Telephone Encounter (Signed)
Check [place] registry last filled 12/09/2019.Marland KitchenJohny Chess

## 2020-01-09 ENCOUNTER — Other Ambulatory Visit: Payer: Self-pay | Admitting: Internal Medicine

## 2020-01-26 ENCOUNTER — Other Ambulatory Visit: Payer: Self-pay | Admitting: Internal Medicine

## 2020-02-09 NOTE — Progress Notes (Signed)
Subjective:    Patient ID: Brandon Schaefer, male    DOB: 19-Nov-1955, 64 y.o.   MRN: [id number]  HPI The patient is here for follow up of their chronic medical problems, including htn, DM, anxiety, B12 def, peripheral neuropathy d/t chemo/DM  He is taking all of his medications as prescribed.   His sugars are in the low 200's.  He is doing some walking daily - walks 300 yards but his normal walking.    He has a chronic area of erythema on his right lower leg from an old infection about 6 years ago.  He will get intermittent blisters - last night one busted and drained clear-yellow fluid. He denies any pain.  Just burning and itching.  The last blister came 3 months ago.  The blisters tend to come for no reason every few months.  After the placement there is a go away he has scabs that have formed and eventually they will fall off, but it takes a long time.  He denies any fevers or chills.  Medications and allergies reviewed with patient and updated if appropriate.  Patient Active Problem List   Diagnosis Date Noted  . History of nephrolithiasis 11/10/2019  . B12 deficiency 05/11/2019  . Tremor 01/27/2018  . Right medial malleolar fracture 11/26/2015  . Anxiety 11/26/2015  . Hyperlipidemia 11/26/2015  . Bilateral leg edema 08/22/2015  . Back pain 03/20/2015  . Anemia, iron deficiency 05/05/2012  . DM (diabetes mellitus) ([place]) 01/18/2012  . HTN (hypertension) 01/18/2012  . Morbid obesity ([place]) 01/17/2012  . Cancer of sigmoid colon ([place]) 12/23/2011  . Arthritis of knee, degenerative 08/09/2011  . Peripheral nerve disease 01/01/2006    Current Outpatient Medications on File Prior to Visit  Medication Sig Dispense Refill  . aspirin EC 81 MG tablet Take 81 mg by mouth daily.    . carvedilol (COREG) 6.25 MG tablet TAKE 1 TABLET(6.25 MG) BY MOUTH TWICE DAILY WITH A MEAL 180 tablet 1  . CONTOUR NEXT TEST test strip Use to check blood sugars 3 times daily as directed. DX CODE- E11.9  300 each 2  . DULoxetine (CYMBALTA) 30 MG capsule TAKE 1 CAPSULE BY MOUTH DAILY 90 capsule 1  . insulin lispro protamine-lispro (HUMALOG MIX 75/25) (75-25) 100 UNIT/ML SUSP injection INJECT 42 UNITS UNDER THE SKIN EVERY MORNING AND 36 UNITS AT NIGHT 70 mL 0  . Insulin Syringe-Needle U-100 31G X 5/16" 1 ML MISC Use as directed twice daily for insulin 180 each 3  . meloxicam (MOBIC) 15 MG tablet TAKE 1 TABLET(15 MG) BY MOUTH DAILY 90 tablet 1  . metFORMIN (GLUCOPHAGE) 1000 MG tablet TAKE 1 TABLET(1000 MG) BY MOUTH TWICE DAILY WITH A MEAL 180 tablet 1  . pregabalin (LYRICA) 75 MG capsule TAKE 1 CAPSULE(75 MG) BY MOUTH THREE TIMES DAILY 90 capsule 0  . ramipril (ALTACE) 10 MG capsule TAKE 1 CAPSULE(10 MG) BY MOUTH DAILY BEFORE BREAKFAST 90 capsule 1  . rosuvastatin (CRESTOR) 20 MG tablet TAKE 1 TABLET(20 MG) BY MOUTH DAILY BEFORE BREAKFAST 90 tablet 1  . sitaGLIPtin (JANUVIA) 100 MG tablet Take 1 tablet (100 mg total) by mouth daily. 30 tablet 5   No current facility-administered medications on file prior to visit.    Past Medical History:  Diagnosis Date  . Allergy   . Anemia   . Anxiety   . colon ca dx'd 11/2011  . Dental abscess 08/03/2015  . Diabetes mellitus   . Headache(784.0)   . History of  blood transfusion   . Hyperlipidemia   . Hypertension   . Neuromuscular disorder ([place])    peripheral neuropathy  . Shortness of breath    with exertion    Past Surgical History:  Procedure Laterality Date  . COMPLEX WOUND CLOSURE N/A 01/06/2013   Procedure: EXCISION CHRONIC ABDOMINAL WOUND;  Surgeon: Harl Bowie, MD;  Location: [facility];  Service: General;  Laterality: N/A;  . CYSTOSCOPY W/ URETERAL STENT PLACEMENT Bilateral 01/04/2013   Procedure: CYSTOSCOPY WITH RETROGRADE PYELOGRAM/Right double J URETERAL STENT PLACEMENT;  Surgeon: Alexis Frock, MD;  Location: [facility];  Service: Urology;  Laterality: Bilateral;  . CYSTOSCOPY WITH RETROGRADE PYELOGRAM, URETEROSCOPY AND STENT PLACEMENT  Right 01/06/2013   Procedure: CYSTOSCOPY WITH RIGHT RETROGRADE PYELOGRAM, URETEROSCOPY AND BILATERAL STENT EXCHANGE, BILATERAL STONE BASKET EXTRACTION ;  Surgeon: Alexis Frock, MD;  [facility];  Service: Urology;  Laterality: Right;  . FRACTURE SURGERY      ORIF-left radius has pin  . HOLMIUM LASER APPLICATION N/A 12/14/5174   Procedure: HOLMIUM LASER APPLICATION;  Surgeon: Alexis Frock, MD;  [facility];  Service: Urology;  Laterality: N/A;  . NEPHROLITHOTOMY Left 01/04/2013   Procedure: NEPHROLITHOTOMY PERCUTANEOUS  LEFT 1ST STAGE PERCUTANEOUS NEPHROSTOLITHOTOMY;  Surgeon: Alexis Frock, MD;  [facility];  Service: Urology;  Laterality: Left;  . NEPHROLITHOTOMY Left 01/06/2013   Procedure: NEPHROLITHOTOMY PERCUTANEOUS SECOND LOOK;  Surgeon: Alexis Frock, MD;  [facility];  Service: Urology;  Laterality: Left;  . PARTIAL COLECTOMY  01/17/2012   Procedure: PARTIAL COLECTOMY;  Surgeon: Harl Bowie, MD;  [facility];  Service: General;;  . PILONIDAL CYST EXCISION    . PORT-A-CATH REMOVAL Left 01/06/2013   Procedure: REMOVAL PORT-A-CATH;  Surgeon: Harl Bowie, MD;  [facility];  Service: General;  Laterality: Left;  . PORTACATH PLACEMENT  03/19/2012   Procedure: INSERTION PORT-A-CATH;  Surgeon: Harl Bowie, MD;  Location: [place];  Service: General;  Laterality: N/A;  . PROCTOSCOPY  01/17/2012   Procedure: PROCTOSCOPY;  Surgeon: Harl Bowie, MD;  [facility];  Service: General;;    Social History   Socioeconomic History  . Marital status: Single    Spouse name: Not on file  . Number of children: Not on file  . Years of education: Not on file  . Highest education level: Not on file  Occupational History  . Not on file  Tobacco Use  . Smoking status: Never Smoker  . Smokeless tobacco: Never Used  . Tobacco comment: NEVER USED TOBACC0  Substance and Sexual Activity  . Alcohol use: No    Alcohol/week: 0.0  standard drinks  . Drug use: No  . Sexual activity: Not on file  Other Topics Concern  . Not on file  Social History Narrative  . Not on file   Social Determinants of Health   Financial Resource Strain:   . Difficulty of Paying Living Expenses: Not on file  Food Insecurity:   . Worried About Charity fundraiser in the Last Year: Not on file  . Ran Out of Food in the Last Year: Not on file  Transportation Needs:   . Lack of Transportation (Medical): Not on file  . Lack of Transportation (Non-Medical): Not on file  Physical Activity:   . Days of Exercise per Week: Not on file  . Minutes of Exercise per Session: Not on file  Stress:   . Feeling of Stress : Not on file  Social Connections:   .  Frequency of Communication with Friends and Family: Not on file  . Frequency of Social Gatherings with Friends and Family: Not on file  . Attends Religious Services: Not on file  . Active Member of Clubs or Organizations: Not on file  . Attends Archivist Meetings: Not on file  . Marital Status: Not on file    Family History  Problem Relation Age of Onset  . Diabetes Father   . Cancer Maternal Aunt        colon  . Cancer Maternal Grandmother        colon    Review of Systems  Constitutional: Negative for chills and fever.  Respiratory: Negative for cough, shortness of breath and wheezing.   Cardiovascular: Negative for chest pain, palpitations and leg swelling.  Skin: Positive for color change.  Neurological: Negative for light-headedness and headaches.       Objective:   Vitals:   02/10/20 0859  BP: 132/82  Pulse: 79  Temp: 98.3 F (36.8 C)  SpO2: 97%   BP Readings from Last 3 Encounters:  02/10/20 132/82  11/10/19 (!) 138/68  08/10/19 128/64   Wt Readings from Last 3 Encounters:  02/10/20 (!) 402 lb (182.3 kg)  11/10/19 (!) 394 lb 6.4 oz (178.9 kg)  08/10/19 (!) 406 lb 12.8 oz (184.5 kg)   Body mass index is 54.52 kg/m.   Physical Exam     Constitutional: Appears well-developed and well-nourished. No distress.  HENT:  Head: Normocephalic and atraumatic.  Neck: Neck supple. No tracheal deviation present. No thyromegaly present.  No cervical lymphadenopathy Cardiovascular: Normal rate, regular rhythm and normal heart sounds.   No murmur heard. No carotid bruit .  No lower extremity edema Pulmonary/Chest: Effort normal and breath sounds normal. No respiratory distress. No has no wheezes. No rales.  Skin: Skin is warm and dry. Not diaphoretic.  Right lower leg-large area of erythema on anterior aspect with areas of yellow crusting scabs, lower aspect he has a large blister that is draining clear fluid.  No fluctuance under the skin in the area of erythema is nontender Psychiatric: Normal mood and affect. Behavior is normal.      Assessment & Plan:    See Problem List for Assessment and Plan of chronic medical problems.    This visit occurred during the SARS-CoV-2 public health emergency.  Safety protocols were in place, including screening questions prior to the visit, additional usage of staff PPE, and extensive cleaning of exam room while observing appropriate contact time as indicated for disinfecting solutions.

## 2020-02-10 ENCOUNTER — Other Ambulatory Visit: Payer: Self-pay

## 2020-02-10 ENCOUNTER — Encounter: Payer: Self-pay | Admitting: Internal Medicine

## 2020-02-10 ENCOUNTER — Ambulatory Visit (INDEPENDENT_AMBULATORY_CARE_PROVIDER_SITE_OTHER): Payer: Medicare Other | Admitting: Internal Medicine

## 2020-02-10 VITALS — BP 132/82 | HR 79 | Temp 98.3°F | Ht 72.0 in | Wt >= 6400 oz

## 2020-02-10 DIAGNOSIS — Z23 Encounter for immunization: Secondary | ICD-10-CM

## 2020-02-10 DIAGNOSIS — Z794 Long term (current) use of insulin: Secondary | ICD-10-CM | POA: Diagnosis not present

## 2020-02-10 DIAGNOSIS — E7849 Other hyperlipidemia: Secondary | ICD-10-CM | POA: Diagnosis not present

## 2020-02-10 DIAGNOSIS — F419 Anxiety disorder, unspecified: Secondary | ICD-10-CM

## 2020-02-10 DIAGNOSIS — I10 Essential (primary) hypertension: Secondary | ICD-10-CM

## 2020-02-10 DIAGNOSIS — Z6841 Body Mass Index (BMI) 40.0 and over, adult: Secondary | ICD-10-CM

## 2020-02-10 DIAGNOSIS — E538 Deficiency of other specified B group vitamins: Secondary | ICD-10-CM | POA: Diagnosis not present

## 2020-02-10 DIAGNOSIS — E1142 Type 2 diabetes mellitus with diabetic polyneuropathy: Secondary | ICD-10-CM | POA: Diagnosis not present

## 2020-02-10 DIAGNOSIS — L0100 Impetigo, unspecified: Secondary | ICD-10-CM

## 2020-02-10 LAB — CBC WITH DIFFERENTIAL/PLATELET
Basophils Absolute: 0.1 10*3/uL (ref 0.0–0.1)
Basophils Relative: 0.6 % (ref 0.0–3.0)
Eosinophils Absolute: 0.3 10*3/uL (ref 0.0–0.7)
Eosinophils Relative: 3.3 % (ref 0.0–5.0)
HCT: 43.1 % (ref 39.0–52.0)
Hemoglobin: 14.6 g/dL (ref 13.0–17.0)
Lymphocytes Relative: 20.1 % (ref 12.0–46.0)
Lymphs Abs: 1.8 10*3/uL (ref 0.7–4.0)
MCHC: 33.9 g/dL (ref 30.0–36.0)
MCV: 81.9 fl (ref 78.0–100.0)
Monocytes Absolute: 0.6 10*3/uL (ref 0.1–1.0)
Monocytes Relative: 6.4 % (ref 3.0–12.0)
Neutro Abs: 6.2 10*3/uL (ref 1.4–7.7)
Neutrophils Relative %: 69.6 % (ref 43.0–77.0)
Platelets: 273 10*3/uL (ref 150.0–400.0)
RBC: 5.26 Mil/uL (ref 4.22–5.81)
RDW: 14 % (ref 11.5–15.5)
WBC: 8.9 10*3/uL (ref 4.0–10.5)

## 2020-02-10 LAB — HEMOGLOBIN A1C: Hgb A1c MFr Bld: 8.6 % — ABNORMAL HIGH (ref 4.6–6.5)

## 2020-02-10 LAB — LIPID PANEL
Cholesterol: 95 mg/dL (ref 0–200)
HDL: 40.8 mg/dL (ref >39.00)
LDL Cholesterol: 28 mg/dL (ref 0–99)
NonHDL: 54.34
Total CHOL/HDL Ratio: 2
Triglycerides: 133 mg/dL (ref 0.0–149.0)
VLDL: 26.6 mg/dL (ref 0.0–40.0)

## 2020-02-10 LAB — COMPREHENSIVE METABOLIC PANEL
ALT: 26 U/L (ref 0–53)
AST: 20 U/L (ref 0–37)
Albumin: 4.2 g/dL (ref 3.5–5.2)
Alkaline Phosphatase: 72 U/L (ref 39–117)
BUN: 17 mg/dL (ref 6–23)
CO2: 25 mEq/L (ref 19–32)
Calcium: 9.6 mg/dL (ref 8.4–10.5)
Chloride: 100 mEq/L (ref 96–112)
Creatinine, Ser: 0.93 mg/dL (ref 0.40–1.50)
GFR: 85.86 mL/min (ref >60.00)
Glucose, Bld: 247 mg/dL — ABNORMAL HIGH (ref 70–99)
Potassium: 4.3 mEq/L (ref 3.5–5.1)
Sodium: 137 mEq/L (ref 135–145)
Total Bilirubin: 0.7 mg/dL (ref 0.2–1.2)
Total Protein: 6.7 g/dL (ref 6.0–8.3)

## 2020-02-10 LAB — VITAMIN B12: Vitamin B-12: 193 pg/mL — ABNORMAL LOW (ref 211–911)

## 2020-02-10 MED ORDER — CEPHALEXIN 500 MG PO CAPS: 500.0000 mg | ORAL_CAPSULE | Freq: Three times a day (TID) | ORAL | 0 refills | Status: DC

## 2020-02-10 MED ORDER — RYBELSUS 3 MG PO TABS: 3.0000 mg | ORAL_TABLET | Freq: Every day | ORAL | 5 refills | Status: DC

## 2020-02-10 NOTE — Assessment & Plan Note (Signed)
Chronic Uncontrolled Sugars in the 200s, sometimes with high 100s.  He did have 1 reading of 107, but typically it is not low Continue Humalog 75/25 45 units every morning and 36 units at night We will start Rybelsus 3 mg daily-we will increase after 1 month Discontinue Januvia Stressed the importance of continue walking and working on weight loss Stressed the importance of getting his sugars better controlled He would prefer not to see an endocrinologist at this time because of his insurance, but we will need to consider this next year if his sugars are now better controlled

## 2020-02-10 NOTE — Assessment & Plan Note (Signed)
Chronic BMI 54.52 Stressed the importance of weight loss He is doing some walking-encouraged him to walk more if he can Decrease portions, low sugar/carbohydrate diet stressed Hopefully adding Rybelsus will help with his weight loss efforts Next year he plans on going back to work and the increased activity may help him lose weight

## 2020-02-10 NOTE — Assessment & Plan Note (Signed)
Chronic Controlled, stable Continue duloxetine 30 mg daily  

## 2020-02-10 NOTE — Assessment & Plan Note (Signed)
Chronic Morbidly obese

## 2020-02-10 NOTE — Assessment & Plan Note (Signed)
Chronic Check vitamin B12 level

## 2020-02-10 NOTE — Assessment & Plan Note (Signed)
Chronic BP well controlled Continue carvedilol 6.25 mg twice daily, ramipril 10 mg daily cmp

## 2020-02-10 NOTE — Patient Instructions (Addendum)
  Blood work was ordered.     Medications reviewed and updated.  Changes include :   Stop [place] and start rybelsus.  Start keflex for your leg - this is an antibiotic.    Your prescription(s) have been submitted to your pharmacy. Please take as directed and contact our office if you believe you are having problem(s) with the medication(s).     Please followup in 3 months

## 2020-02-10 NOTE — Assessment & Plan Note (Signed)
Acute Right lower leg Blisters with clear-yellow fluid that turn into yellow scabs  Area itches and Brandon Schaefer Will start keflex 500 mg TID x 10 days

## 2020-02-10 NOTE — Assessment & Plan Note (Signed)
Chronic Check lipid panel  Continue Crestor 20 mg daily Regular exercise and healthy diet encouraged  

## 2020-02-11 NOTE — Addendum Note (Signed)
Addended by: Marcina Millard on: 02/11/2020 08:22 AM   Modules accepted: Orders

## 2020-03-04 ENCOUNTER — Other Ambulatory Visit: Payer: Self-pay | Admitting: Internal Medicine

## 2020-04-03 ENCOUNTER — Other Ambulatory Visit: Payer: Self-pay | Admitting: Internal Medicine

## 2020-04-17 ENCOUNTER — Other Ambulatory Visit: Payer: Self-pay | Admitting: Internal Medicine

## 2020-05-02 ENCOUNTER — Other Ambulatory Visit: Payer: Self-pay | Admitting: Internal Medicine

## 2020-05-07 ENCOUNTER — Other Ambulatory Visit: Payer: Self-pay | Admitting: Internal Medicine

## 2020-05-08 ENCOUNTER — Other Ambulatory Visit: Payer: Self-pay

## 2020-05-08 MED ORDER — RAMIPRIL 10 MG PO CAPS: ORAL_CAPSULE | ORAL | 1 refills | Status: DC

## 2020-05-11 NOTE — Progress Notes (Signed)
Subjective:    Patient ID: Brandon Schaefer, male    DOB: 04-14-1956, 65 y.o.   MRN: [id number]  HPI The patient is here for follow up of their chronic medical problems, including htn, DM, hyperlipidemia, anxiety, B12 def, peripheral neuropathy d/t  Chemo/DM, leg edema  Sugars at home 200's . He feels he is eating low sugar diet.   His neuropathy is worse.   He thinks he has a UTI.  He has dysuria and cloudy urine.    Medications and allergies reviewed with patient and updated if appropriate.  Patient Active Problem List   Diagnosis Date Noted   BMI 50.0-59.9, adult ([place]) 02/10/2020   Impetigo 02/10/2020   History of nephrolithiasis 11/10/2019   B12 deficiency 05/11/2019   Tremor 01/27/2018   Right medial malleolar fracture 11/26/2015   Anxiety 11/26/2015   Hyperlipidemia 11/26/2015   Bilateral leg edema 08/22/2015   Back pain 03/20/2015   Anemia, iron deficiency 05/05/2012   DM (diabetes mellitus) ([place]) 01/18/2012   HTN (hypertension) 01/18/2012   Morbid obesity ([place]) 01/17/2012   Cancer of sigmoid colon ([place]) 12/23/2011   Arthritis of knee, degenerative 08/09/2011   Peripheral nerve disease 01/01/2006    Current Outpatient Medications on File Prior to Visit  Medication Sig Dispense Refill   aspirin EC 81 MG tablet Take 81 mg by mouth daily.     carvedilol (COREG) 6.25 MG tablet TAKE 1 TABLET(6.25 MG) BY MOUTH TWICE DAILY WITH A MEAL 180 tablet 1   CONTOUR NEXT TEST test strip Use to check blood sugars 3 times daily as directed. DX CODE- E11.9 300 each 2   DULoxetine (CYMBALTA) 30 MG capsule TAKE 1 CAPSULE BY MOUTH DAILY 90 capsule 1   HUMALOG MIX 75/25 (75-25) 100 UNIT/ML SUSP injection INJECT 42 UNITS UNDER THE SKIN EVERY MORNING AND 36 UNITS AT NIGHT 70 mL 0   Insulin Syringe-Needle U-100 31G X 5/16" 1 ML MISC Use as directed twice daily for insulin 180 each 3   meloxicam (MOBIC) 15 MG tablet TAKE 1 TABLET(15 MG) BY MOUTH DAILY 90 tablet 1    metFORMIN (GLUCOPHAGE) 1000 MG tablet TAKE 1 TABLET(1000 MG) BY MOUTH TWICE DAILY WITH A MEAL 180 tablet 1   pregabalin (LYRICA) 75 MG capsule TAKE 1 CAPSULE(75 MG) BY MOUTH THREE TIMES DAILY 90 capsule 0   ramipril (ALTACE) 10 MG capsule TAKE 1 CAPSULE(10 MG) BY MOUTH DAILY BEFORE BREAKFAST 90 capsule 1   rosuvastatin (CRESTOR) 20 MG tablet TAKE 1 TABLET(20 MG) BY MOUTH DAILY BEFORE BREAKFAST 90 tablet 1   No current facility-administered medications on file prior to visit.    Past Medical History:  Diagnosis Date   Allergy    Anemia    Anxiety    colon ca dx'd 11/2011   Dental abscess 08/03/2015   Diabetes mellitus    Headache(784.0)    History of blood transfusion    Hyperlipidemia    Hypertension    Neuromuscular disorder ([place])    peripheral neuropathy   Shortness of breath    with exertion    Past Surgical History:  Procedure Laterality Date   COMPLEX WOUND CLOSURE N/A 01/06/2013   Procedure: EXCISION CHRONIC ABDOMINAL WOUND;  Surgeon: Harl Bowie, MD;  [facility];  Service: General;  Laterality: N/A;   CYSTOSCOPY W/ URETERAL STENT PLACEMENT Bilateral 01/04/2013   Procedure: CYSTOSCOPY WITH RETROGRADE PYELOGRAM/Right double J URETERAL STENT PLACEMENT;  Surgeon: Alexis Frock, MD;  [facility];  Service: Urology;  Laterality: Bilateral;   CYSTOSCOPY WITH RETROGRADE PYELOGRAM, URETEROSCOPY AND STENT PLACEMENT Right 01/06/2013   Procedure: CYSTOSCOPY WITH RIGHT RETROGRADE PYELOGRAM, URETEROSCOPY AND BILATERAL STENT EXCHANGE, BILATERAL STONE BASKET EXTRACTION ;  Surgeon: Alexis Frock, MD;  [facility];  Service: Urology;  Laterality: Right;   FRACTURE SURGERY      ORIF-left radius has pin   HOLMIUM LASER APPLICATION N/A [id number]   Procedure: HOLMIUM LASER APPLICATION;  Surgeon: Alexis Frock, MD;  [facility];  Service: Urology;  Laterality: N/A;   NEPHROLITHOTOMY Left 01/04/2013   Procedure: NEPHROLITHOTOMY PERCUTANEOUS  LEFT  1ST STAGE PERCUTANEOUS NEPHROSTOLITHOTOMY;  Surgeon: Alexis Frock, MD;  [facility];  Service: Urology;  Laterality: Left;   NEPHROLITHOTOMY Left 01/06/2013   Procedure: NEPHROLITHOTOMY PERCUTANEOUS SECOND LOOK;  Surgeon: Alexis Frock, MD;  [facility];  Service: Urology;  Laterality: Left;   PARTIAL COLECTOMY  01/17/2012   Procedure: PARTIAL COLECTOMY;  Surgeon: Harl Bowie, MD;  [facility];  Service: General;;   PILONIDAL CYST EXCISION     PORT-A-CATH REMOVAL Left 01/06/2013   Procedure: REMOVAL PORT-A-CATH;  Surgeon: Harl Bowie, MD;  [facility];  Service: General;  Laterality: Left;   PORTACATH PLACEMENT  03/19/2012   Procedure: INSERTION PORT-A-CATH;  Surgeon: Harl Bowie, MD;  Location: [place];  Service: General;  Laterality: N/A;   PROCTOSCOPY  01/17/2012   Procedure: PROCTOSCOPY;  Surgeon: Harl Bowie, MD;  [facility];  Service: General;;    Social History   Socioeconomic History   Marital status: Single    Spouse name: Not on file   Number of children: Not on file   Years of education: Not on file   Highest education level: Not on file  Occupational History   Not on file  Tobacco Use   Smoking status: Never Smoker   Smokeless tobacco: Never Used   Tobacco comment: NEVER USED TOBACC0  Substance and Sexual Activity   Alcohol use: No    Alcohol/week: 0.0 standard drinks   Drug use: No   Sexual activity: Not on file  Other Topics Concern   Not on file  Social History Narrative   Not on file   Social Determinants of Health   Financial Resource Strain: Not on file  Food Insecurity: Not on file  Transportation Needs: Not on file  Physical Activity: Not on file  Stress: Not on file  Social Connections: Not on file    Family History  Problem Relation Age of Onset   Diabetes Father    Cancer Maternal Aunt        colon   Cancer Maternal Grandmother        colon     Review of Systems  Constitutional: Negative for chills and fever.  Respiratory: Positive for shortness of breath (chronic). Negative for cough and wheezing.   Cardiovascular: Positive for leg swelling. Negative for chest pain and palpitations.  Gastrointestinal: Negative for abdominal pain and nausea.       No gerd  Skin: Positive for color change (redness in legs - same).  Neurological: Positive for numbness. Negative for light-headedness and headaches.       Objective:   Vitals:   05/12/20 0749  BP: 140/80  Pulse: (!) 101  Temp: 98.1 F (36.7 C)  SpO2: 94%   BP Readings from Last 3 Encounters:  05/12/20 140/80  02/10/20 132/82  11/10/19 (!) 138/68   Wt Readings from Last 3 Encounters:  05/12/20 [name])  399 lb (181 kg)  02/10/20 (!) 402 lb (182.3 kg)  11/10/19 (!) 394 lb 6.4 oz (178.9 kg)   Body mass index is 54.11 kg/m.   Physical Exam    Constitutional: Appears well-developed and well-nourished. No distress.  HENT:  Head: Normocephalic and atraumatic.  Neck: Neck supple. No tracheal deviation present. No thyromegaly present.  No cervical lymphadenopathy Cardiovascular: Normal rate, regular rhythm and normal heart sounds.   No murmur heard. No carotid bruit .  No edema Pulmonary/Chest: Effort normal and breath sounds normal. No respiratory distress. No has no wheezes. No rales.  Skin: Skin is warm and dry. Not diaphoretic. Chronic localized erythema of b/l LE with areas of yellow crusting, no warmth or tenderness Psychiatric: Normal mood and affect. Behavior is normal.      Assessment & Plan:    See Problem List for Assessment and Plan of chronic medical problems.    This visit occurred during the SARS-CoV-2 public health emergency.  Safety protocols were in place, including screening questions prior to the visit, additional usage of staff PPE, and extensive cleaning of exam room while observing appropriate contact time as indicated for disinfecting  solutions.

## 2020-05-11 NOTE — Patient Instructions (Addendum)
  Blood work was ordered.     Medications changes include :   rylbesus increased 7 mg daily, lyrica increased to 100 mg three times a day  Your prescription(s) have been submitted to your pharmacy. Please take as directed and contact our office if you believe you are having problem(s) with the medication(s).     Please followup in 3 months

## 2020-05-12 ENCOUNTER — Ambulatory Visit (INDEPENDENT_AMBULATORY_CARE_PROVIDER_SITE_OTHER): Payer: Medicare Other | Admitting: Internal Medicine

## 2020-05-12 ENCOUNTER — Encounter: Payer: Self-pay | Admitting: Internal Medicine

## 2020-05-12 ENCOUNTER — Other Ambulatory Visit: Payer: Self-pay

## 2020-05-12 VITALS — BP 140/80 | HR 101 | Temp 98.1°F | Ht 72.0 in | Wt 399.0 lb

## 2020-05-12 DIAGNOSIS — E1142 Type 2 diabetes mellitus with diabetic polyneuropathy: Secondary | ICD-10-CM

## 2020-05-12 DIAGNOSIS — E119 Type 2 diabetes mellitus without complications: Secondary | ICD-10-CM

## 2020-05-12 DIAGNOSIS — M170 Bilateral primary osteoarthritis of knee: Secondary | ICD-10-CM

## 2020-05-12 DIAGNOSIS — R600 Localized edema: Secondary | ICD-10-CM

## 2020-05-12 DIAGNOSIS — E7849 Other hyperlipidemia: Secondary | ICD-10-CM

## 2020-05-12 DIAGNOSIS — I10 Essential (primary) hypertension: Secondary | ICD-10-CM

## 2020-05-12 DIAGNOSIS — Z794 Long term (current) use of insulin: Secondary | ICD-10-CM

## 2020-05-12 DIAGNOSIS — E538 Deficiency of other specified B group vitamins: Secondary | ICD-10-CM

## 2020-05-12 DIAGNOSIS — R300 Dysuria: Secondary | ICD-10-CM | POA: Diagnosis not present

## 2020-05-12 DIAGNOSIS — Z6841 Body Mass Index (BMI) 40.0 and over, adult: Secondary | ICD-10-CM | POA: Diagnosis not present

## 2020-05-12 DIAGNOSIS — G629 Polyneuropathy, unspecified: Secondary | ICD-10-CM

## 2020-05-12 LAB — LIPID PANEL
Cholesterol: 92 mg/dL (ref 0–200)
HDL: 38.2 mg/dL — ABNORMAL LOW (ref >39.00)
LDL Cholesterol: 29 mg/dL (ref 0–99)
NonHDL: 53.6
Total CHOL/HDL Ratio: 2
Triglycerides: 121 mg/dL (ref 0.0–149.0)
VLDL: 24.2 mg/dL (ref 0.0–40.0)

## 2020-05-12 LAB — COMPREHENSIVE METABOLIC PANEL
ALT: 21 U/L (ref 0–53)
AST: 16 U/L (ref 0–37)
Albumin: 4.1 g/dL (ref 3.5–5.2)
Alkaline Phosphatase: 80 U/L (ref 39–117)
BUN: 18 mg/dL (ref 6–23)
CO2: 27 mEq/L (ref 19–32)
Calcium: 9.3 mg/dL (ref 8.4–10.5)
Chloride: 100 mEq/L (ref 96–112)
Creatinine, Ser: 0.98 mg/dL (ref 0.40–1.50)
GFR: 80.49 mL/min (ref >60.00)
Glucose, Bld: 248 mg/dL — ABNORMAL HIGH (ref 70–99)
Potassium: 4.5 mEq/L (ref 3.5–5.1)
Sodium: 136 mEq/L (ref 135–145)
Total Bilirubin: 0.6 mg/dL (ref 0.2–1.2)
Total Protein: 6.9 g/dL (ref 6.0–8.3)

## 2020-05-12 LAB — POCT GLYCOSYLATED HEMOGLOBIN (HGB A1C)
HbA1c POC (<> result, manual entry): 6.1 % (ref 4.0–5.6)
HbA1c, POC (controlled diabetic range): 6.1 % (ref 0.0–7.0)
HbA1c, POC (prediabetic range): 6.1 % (ref 5.7–6.4)
Hemoglobin A1C: 6.1 % — AB (ref 4.0–5.6)

## 2020-05-12 LAB — URINALYSIS, ROUTINE W REFLEX MICROSCOPIC
Bilirubin Urine: NEGATIVE
Ketones, ur: NEGATIVE
Leukocytes,Ua: NEGATIVE
Nitrite: NEGATIVE
Specific Gravity, Urine: 1.025 (ref 1.000–1.030)
Urine Glucose: 250 — AB
Urobilinogen, UA: 0.2 (ref 0.0–1.0)
pH: 5.5 (ref 5.0–8.0)

## 2020-05-12 LAB — VITAMIN B12: Vitamin B-12: 461 pg/mL (ref 211–911)

## 2020-05-12 LAB — TSH: TSH: 3.85 u[IU]/mL (ref 0.35–4.50)

## 2020-05-12 LAB — HEMOGLOBIN A1C: Hgb A1c MFr Bld: 8.1 % — ABNORMAL HIGH (ref 4.6–6.5)

## 2020-05-12 MED ORDER — PREGABALIN 100 MG PO CAPS: 100.0000 mg | ORAL_CAPSULE | Freq: Three times a day (TID) | ORAL | 0 refills | Status: DC

## 2020-05-12 MED ORDER — RYBELSUS 7 MG PO TABS: 7.0000 mg | ORAL_TABLET | Freq: Every day | ORAL | 3 refills | Status: DC

## 2020-05-12 NOTE — Assessment & Plan Note (Signed)
Chronic  Uncontrolled with neruopathy Sugars in 200's at home Increase rybelsus to 7 mg - plan to increase to 14 mg after one month continue metformin 1000 mg BID, humalog 75/25 42 units Q am and 36 units q pm a1c

## 2020-05-12 NOTE — Assessment & Plan Note (Signed)
Chronic Controlled, stable Not currently on any diuretics Monitor

## 2020-05-12 NOTE — Addendum Note (Signed)
Addended by: Marcina Millard on: 05/12/2020 12:34 PM   Modules accepted: Orders

## 2020-05-12 NOTE — Assessment & Plan Note (Signed)
Chronic Check lipid panel  Continue Crestor 20 mg daily Regular exercise and healthy diet encouraged  

## 2020-05-12 NOTE — Assessment & Plan Note (Signed)
Chronic He is not sure if the meloxicam works or not Advised him that if it is not helping to stop taking it

## 2020-05-12 NOTE — Assessment & Plan Note (Signed)
Chronic Not controlled - worse Stressed better sugar control - meds adjusted Increase lyrica to 100 mg TID Deferred increasing cymbalta

## 2020-05-12 NOTE — Addendum Note (Signed)
Addended by: Marcina Millard on: 05/12/2020 12:54 PM   Modules accepted: Orders

## 2020-05-12 NOTE — Assessment & Plan Note (Signed)
Chronic BMI more than 50 Encourage as much activity as possible Decrease portions, diabetic diet Will increase Rybelsus to 7 mg-hopefully this will help some

## 2020-05-12 NOTE — Assessment & Plan Note (Signed)
BP Readings from Last 3 Encounters:  05/12/20 140/80  02/10/20 132/82  11/10/19 (!) 138/68   Chronic BP controlled Continue coreg 6.25 mg BID, ramipril 10 mg daily cmp

## 2020-05-12 NOTE — Assessment & Plan Note (Signed)
Chronic Taking B12 Check level 

## 2020-05-13 LAB — URINE CULTURE

## 2020-05-29 ENCOUNTER — Other Ambulatory Visit: Payer: Self-pay | Admitting: Internal Medicine

## 2020-06-09 ENCOUNTER — Other Ambulatory Visit: Payer: Self-pay | Admitting: Internal Medicine

## 2020-06-19 ENCOUNTER — Other Ambulatory Visit: Payer: Self-pay | Admitting: Internal Medicine

## 2020-06-20 ENCOUNTER — Other Ambulatory Visit: Payer: Self-pay | Admitting: Internal Medicine

## 2020-06-23 ENCOUNTER — Other Ambulatory Visit: Payer: Self-pay | Admitting: Internal Medicine

## 2020-06-23 DIAGNOSIS — I8310 Varicose veins of unspecified lower extremity with inflammation: Secondary | ICD-10-CM | POA: Diagnosis not present

## 2020-06-23 DIAGNOSIS — D485 Neoplasm of uncertain behavior of skin: Secondary | ICD-10-CM | POA: Diagnosis not present

## 2020-06-23 DIAGNOSIS — L218 Other seborrheic dermatitis: Secondary | ICD-10-CM | POA: Diagnosis not present

## 2020-06-23 DIAGNOSIS — L918 Other hypertrophic disorders of the skin: Secondary | ICD-10-CM | POA: Diagnosis not present

## 2020-07-06 ENCOUNTER — Other Ambulatory Visit: Payer: Self-pay | Admitting: Internal Medicine

## 2020-08-03 ENCOUNTER — Other Ambulatory Visit: Payer: Self-pay | Admitting: Internal Medicine

## 2020-08-10 ENCOUNTER — Other Ambulatory Visit: Payer: Self-pay

## 2020-08-10 NOTE — Assessment & Plan Note (Addendum)
Chronic improved Secondary to chemotherapy and DM Working to get sugars better controlled Continue lyrica 100 mg Tid Continue cymbalta 30 mg daily

## 2020-08-10 NOTE — Patient Instructions (Addendum)
  Blood work was ordered.     Medications changes include :  Increase insulin to 45 units in the morning and 40 units in the evening  Monitor sugars closely  Your prescription(s) have been submitted to your pharmacy. Please take as directed and contact our office if you believe you are having problem(s) with the medication(s).     Please followup in 3 months

## 2020-08-10 NOTE — Progress Notes (Signed)
Subjective:    Patient ID: Brandon Schaefer, male    DOB: Sep 26, 1955, 65 y.o.   MRN: [id number]  HPI The patient is here for follow up of their chronic medical problems, including htn, DM, hyperlipidemia, anxiety, B12 def, peripheral neuropathy d/t chemo/DM, leg edema  He is taking all of his medications as prescribed. He had to stop the rybelsus due to cost.  He feels he is eating well.  His sugars have been in the 200s.  The lowest he has seen was 136.  He has seen some sugars in the 300s.   Medications and allergies reviewed with patient and updated if appropriate.  Patient Active Problem List   Diagnosis Date Noted  . BMI 50.0-59.9, adult ([place]) 02/10/2020  . Impetigo 02/10/2020  . History of nephrolithiasis 11/10/2019  . B12 deficiency 05/11/2019  . Tremor 01/27/2018  . Right medial malleolar fracture 11/26/2015  . Anxiety 11/26/2015  . Hyperlipidemia 11/26/2015  . Bilateral leg edema 08/22/2015  . Back pain 03/20/2015  . Anemia, iron deficiency 05/05/2012  . DM (diabetes mellitus) ([place]) 01/18/2012  . HTN (hypertension) 01/18/2012  . Morbid obesity ([place]) 01/17/2012  . Cancer of sigmoid colon ([place]) 12/23/2011  . Arthritis of knee, degenerative 08/09/2011  . 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 aspirin EC 81 MG tablet Take 81 mg by mouth daily.    . carvedilol (COREG) 6.25 MG tablet TAKE 1 TABLET(6.25 MG) BY MOUTH TWICE DAILY WITH A MEAL 180 tablet 1  . CONTOUR NEXT TEST test strip USE TO CHECK BLOOD SUGAR THREE TIMES DAILY AS DIRECTED 300 strip 2  . DULoxetine (CYMBALTA) 30 MG capsule TAKE 1 CAPSULE BY MOUTH DAILY 90 capsule 1  . Insulin Syringe-Needle U-100 31G X 5/16" 1 ML MISC Use as directed twice daily for insulin 180 each 3  . meloxicam (MOBIC) 15 MG tablet TAKE 1 TABLET(15 MG) BY MOUTH DAILY 90 tablet 1  . metFORMIN (GLUCOPHAGE) 1000 MG tablet TAKE 1 TABLET(1000 MG) BY MOUTH  TWICE DAILY WITH A MEAL 180 tablet 1  . pregabalin (LYRICA) 100 MG capsule TAKE 1 CAPSULE(100 MG) BY MOUTH THREE TIMES DAILY 90 capsule 0  . ramipril (ALTACE) 10 MG capsule TAKE 1 CAPSULE(10 MG) BY MOUTH DAILY BEFORE BREAKFAST 90 capsule 1  . rosuvastatin (CRESTOR) 20 MG tablet TAKE 1 TABLET(20 MG) BY MOUTH DAILY BEFORE BREAKFAST 90 tablet 1   No current facility-administered medications on file prior to visit.    Past Medical History:  Diagnosis Date  . Allergy   . Anemia   . Anxiety   . colon ca dx'd 11/2011  . Dental abscess 08/03/2015  . Diabetes mellitus   . Headache(784.0)   . History of blood transfusion   . Hyperlipidemia   . Hypertension   . Neuromuscular disorder ([place])    peripheral neuropathy  . Shortness of breath    with exertion    Past Surgical History:  Procedure Laterality Date  . COMPLEX WOUND CLOSURE N/A 01/06/2013   Procedure: EXCISION CHRONIC ABDOMINAL WOUND;  Surgeon: Harl Bowie, MD;  Location: [facility];  Service: General;  Laterality: N/A;  . CYSTOSCOPY W/ URETERAL STENT PLACEMENT Bilateral 01/04/2013   Procedure: CYSTOSCOPY WITH RETROGRADE PYELOGRAM/Right double J URETERAL STENT PLACEMENT;  Surgeon: Alexis Frock, MD;  Location[facility];  Service: Urology;  Laterality: Bilateral;  . CYSTOSCOPY WITH RETROGRADE PYELOGRAM, URETEROSCOPY AND STENT PLACEMENT Right 01/06/2013  Procedure: CYSTOSCOPY WITH RIGHT RETROGRADE PYELOGRAM, URETEROSCOPY AND BILATERAL STENT EXCHANGE, BILATERAL STONE BASKET EXTRACTION ;  Surgeon: Alexis Frock, MD;  [facility];  Service: Urology;  Laterality: Right;  . FRACTURE SURGERY      ORIF-left radius has pin  . HOLMIUM LASER APPLICATION N/A 8/75/6433   Procedure: HOLMIUM LASER APPLICATION;  Surgeon: Alexis Frock, MD;  [facility];  Service: Urology;  Laterality: N/A;  . NEPHROLITHOTOMY Left 01/04/2013   Procedure: NEPHROLITHOTOMY PERCUTANEOUS  LEFT 1ST STAGE PERCUTANEOUS NEPHROSTOLITHOTOMY;  Surgeon: Alexis Frock,  MD;  [facility];  Service: Urology;  Laterality: Left;  . NEPHROLITHOTOMY Left 01/06/2013   Procedure: NEPHROLITHOTOMY PERCUTANEOUS SECOND LOOK;  Surgeon: Alexis Frock, MD;  [facility];  Service: Urology;  Laterality: Left;  . PARTIAL COLECTOMY  01/17/2012   Procedure: PARTIAL COLECTOMY;  Surgeon: Harl Bowie, MD;  [facility];  Service: General;;  . PILONIDAL CYST EXCISION    . PORT-A-CATH REMOVAL Left 01/06/2013   Procedure: REMOVAL PORT-A-CATH;  Surgeon: Harl Bowie, MD;  [facility];  Service: General;  Laterality: Left;  . PORTACATH PLACEMENT  03/19/2012   Procedure: INSERTION PORT-A-CATH;  Surgeon: Harl Bowie, MD;  Location: [place];  Service: General;  Laterality: N/A;  . PROCTOSCOPY  01/17/2012   Procedure: PROCTOSCOPY;  Surgeon: Harl Bowie, MD;  [facility];  Service: General;;    Social History   Socioeconomic History  . Marital status: Single    Spouse name: Not on file  . Number of children: Not on file  . Years of education: Not on file  . Highest education level: Not on file  Occupational History  . Not on file  Tobacco Use  . Smoking status: Never Smoker  . Smokeless tobacco: Never Used  . Tobacco comment: NEVER USED TOBACC0  Substance and Sexual Activity  . Alcohol use: No    Alcohol/week: 0.0 standard drinks  . Drug use: No  . Sexual activity: Not on file  Other Topics Concern  . Not on file  Social History Narrative  . Not on file   Social Determinants of Health   Financial Resource Strain: Not on file  Food Insecurity: Not on file  Transportation Needs: Not on file  Physical Activity: Not on file  Stress: Not on file  Social Connections: Not on file    Family History  Problem Relation Age of Onset  . Diabetes Father   . Cancer Maternal Aunt        colon  . Cancer Maternal Grandmother        colon    Review of Systems  Constitutional: Negative for chills and fever.   Respiratory: Negative for cough, shortness of breath and wheezing.   Cardiovascular: Positive for leg swelling (usually just his feet). Negative for chest pain and palpitations.  Neurological: Negative for light-headedness and headaches.       Objective:   Vitals:   08/11/20 0804  BP: (!) 148/88  Pulse: 89  Temp: 98.3 F (36.8 C)  SpO2: 95%   BP Readings from Last 3 Encounters:  08/11/20 (!) 148/88  05/12/20 140/80  02/10/20 132/82   Wt Readings from Last 3 Encounters:  08/11/20 (!) 401 lb (181.9 kg)  05/12/20 (!) 399 lb (181 kg)  02/10/20 (!) 402 lb (182.3 kg)   Body mass index is 54.39 kg/m.   Physical Exam    Constitutional: Appears well-developed and well-nourished. No distress.  HENT:  Head: Normocephalic  and atraumatic.  Neck: Neck supple. No tracheal deviation present. No thyromegaly present.  No cervical lymphadenopathy Cardiovascular: Normal rate, regular rhythm and normal heart sounds.   No murmur heard. No carotid bruit .  Mild b/l LE edema Pulmonary/Chest: Effort normal and breath sounds normal. No respiratory distress. No has no wheezes. No rales.  Skin: Skin is warm and dry. Not diaphoretic.  Psychiatric: Normal mood and affect. Behavior is normal.      Assessment & Plan:    Advised covid booster #2   See Problem List for Assessment and Plan of chronic medical problems.    This visit occurred during the SARS-CoV-2 public health emergency.  Safety protocols were in place, including screening questions prior to the visit, additional usage of staff PPE, and extensive cleaning of exam room while observing appropriate contact time as indicated for disinfecting solutions.

## 2020-08-11 ENCOUNTER — Encounter: Payer: Self-pay | Admitting: Internal Medicine

## 2020-08-11 ENCOUNTER — Other Ambulatory Visit: Payer: Self-pay

## 2020-08-11 ENCOUNTER — Ambulatory Visit (INDEPENDENT_AMBULATORY_CARE_PROVIDER_SITE_OTHER): Payer: Medicare Other | Admitting: Internal Medicine

## 2020-08-11 VITALS — BP 148/88 | HR 89 | Temp 98.3°F | Ht 72.0 in | Wt >= 6400 oz

## 2020-08-11 DIAGNOSIS — E7849 Other hyperlipidemia: Secondary | ICD-10-CM | POA: Diagnosis not present

## 2020-08-11 DIAGNOSIS — E1142 Type 2 diabetes mellitus with diabetic polyneuropathy: Secondary | ICD-10-CM | POA: Diagnosis not present

## 2020-08-11 DIAGNOSIS — E538 Deficiency of other specified B group vitamins: Secondary | ICD-10-CM | POA: Diagnosis not present

## 2020-08-11 DIAGNOSIS — G622 Polyneuropathy due to other toxic agents: Secondary | ICD-10-CM | POA: Diagnosis not present

## 2020-08-11 DIAGNOSIS — I10 Essential (primary) hypertension: Secondary | ICD-10-CM

## 2020-08-11 DIAGNOSIS — F419 Anxiety disorder, unspecified: Secondary | ICD-10-CM | POA: Diagnosis not present

## 2020-08-11 DIAGNOSIS — Z794 Long term (current) use of insulin: Secondary | ICD-10-CM | POA: Diagnosis not present

## 2020-08-11 DIAGNOSIS — R600 Localized edema: Secondary | ICD-10-CM

## 2020-08-11 LAB — LIPID PANEL
Cholesterol: 87 mg/dL (ref 0–200)
HDL: 35.1 mg/dL — ABNORMAL LOW (ref >39.00)
LDL Cholesterol: 31 mg/dL (ref 0–99)
NonHDL: 51.96
Total CHOL/HDL Ratio: 2
Triglycerides: 104 mg/dL (ref 0.0–149.0)
VLDL: 20.8 mg/dL (ref 0.0–40.0)

## 2020-08-11 LAB — COMPREHENSIVE METABOLIC PANEL
ALT: 20 U/L (ref 0–53)
AST: 16 U/L (ref 0–37)
Albumin: 3.9 g/dL (ref 3.5–5.2)
Alkaline Phosphatase: 81 U/L (ref 39–117)
BUN: 23 mg/dL (ref 6–23)
CO2: 28 mEq/L (ref 19–32)
Calcium: 9.1 mg/dL (ref 8.4–10.5)
Chloride: 102 mEq/L (ref 96–112)
Creatinine, Ser: 1.03 mg/dL (ref 0.40–1.50)
GFR: 75.69 mL/min (ref >60.00)
Glucose, Bld: 224 mg/dL — ABNORMAL HIGH (ref 70–99)
Potassium: 4.3 mEq/L (ref 3.5–5.1)
Sodium: 141 mEq/L (ref 135–145)
Total Bilirubin: 0.6 mg/dL (ref 0.2–1.2)
Total Protein: 6.8 g/dL (ref 6.0–8.3)

## 2020-08-11 LAB — HEMOGLOBIN A1C: Hgb A1c MFr Bld: 8.5 % — ABNORMAL HIGH (ref 4.6–6.5)

## 2020-08-11 LAB — VITAMIN B12: Vitamin B-12: 1314 pg/mL — ABNORMAL HIGH (ref 211–911)

## 2020-08-11 MED ORDER — FREESTYLE LIBRE 14 DAY SENSOR MISC: 5 refills | Status: DC

## 2020-08-11 MED ORDER — HUMALOG MIX 75/25 (75-25) 100 UNIT/ML ~~LOC~~ SUSP: SUBCUTANEOUS | 5 refills | Status: DC

## 2020-08-11 MED ORDER — FREESTYLE LIBRE 2 READER DEVI: 0 refills | Status: DC

## 2020-08-11 NOTE — Assessment & Plan Note (Addendum)
Chronic Not ideally controlled - three months ago rybelsus was increased to 7 mg - had to stop it Sugars in 200's at home - always elevated.   No sugar < 136 a1c today Had to stop rybelsus due to cost Continue metformin 1000 mg bid Continue humalog 75/25  - 2 injections a day -  42 units am and 36 units pm  -- increase 45 units in am and 40 units in pm Monitor sugars closely at home - would benefit from freestyle libre due to hyperglycemia and need to adjust insulin dose as needed and prevent hypoglycemia Encouraged weight loss

## 2020-08-11 NOTE — Assessment & Plan Note (Signed)
Chronic Controlled, stable Not on medication

## 2020-08-11 NOTE — Assessment & Plan Note (Addendum)
Chronic BP elevated here today, but he states he was still breathing hard from walking into the office when his blood pressure was checked-typically lower Encouraged increased activity, weight loss Continue coreg 6.25 mg bid, ramipril 10 mg qd cmp

## 2020-08-11 NOTE — Assessment & Plan Note (Signed)
Chronic Controlled - at goal Check lipid panel  Continue crestor 20 mg daily Regular exercise and healthy diet encouraged   Lab Results  Component Value Date   LDLCALC 29 05/12/2020

## 2020-08-11 NOTE — Assessment & Plan Note (Signed)
Chronic Controlled, stable Continue cymbalta 30 mg qd

## 2020-08-11 NOTE — Assessment & Plan Note (Signed)
Chronic Taking B12 Check level 

## 2020-08-15 ENCOUNTER — Telehealth: Payer: Self-pay | Admitting: Internal Medicine

## 2020-08-15 NOTE — Telephone Encounter (Signed)
Continuous Blood Gluc Receiver (FREESTYLE LIBRE 2 READER) DEVI  Continuous Blood Gluc Sensor (FREESTYLE LIBRE 14 DAY SENSOR) MISC Patient spoke to his pharmacy and they told him to call and ask on the status of the prior auth.

## 2020-08-16 ENCOUNTER — Telehealth: Payer: Self-pay | Admitting: Pharmacist

## 2020-08-16 DIAGNOSIS — Z794 Long term (current) use of insulin: Secondary | ICD-10-CM

## 2020-08-16 DIAGNOSIS — E1142 Type 2 diabetes mellitus with diabetic polyneuropathy: Secondary | ICD-10-CM

## 2020-08-16 MED ORDER — RYBELSUS 7 MG PO TABS: 7.0000 mg | ORAL_TABLET | Freq: Every day | ORAL | 3 refills | Status: DC

## 2020-08-16 NOTE — Telephone Encounter (Signed)
Patient has [facility] insurance and reports copay for Rybelsus 7 mg is cost prohibitive at this time.  Reviewed application process for [employer] patient assistance program. Patient meets income/out of pocket spend criteria for the program. Patient will provide proof of income, out of pocket spend report, and will sign application. Will collaborate with prescriber Dr Quay Burow for the provider portion of application. Once completed, application will be submitted via Fax   Patient assistance program Fax number: [phone]  Patient requested application be mailed to him complete.  Charlton Haws, [facility]

## 2020-08-16 NOTE — Telephone Encounter (Signed)
noted 

## 2020-08-17 NOTE — Telephone Encounter (Signed)
Called number on pharmacy fax for [facility] @ [phone]. Spoke with [employer]. Set up patient's account for freestyle and readers to be shipped to him. They are sending over fax for Dr. Quay Burow to sign off on and once received they will process and ship to patient. Patient made aware of process.

## 2020-08-28 ENCOUNTER — Other Ambulatory Visit: Payer: Self-pay | Admitting: Internal Medicine

## 2020-09-13 NOTE — Telephone Encounter (Signed)
[facility] called and was wondering if an addendum had been received. They said that it was faxed over on 5/23. They are requesting that it be faxed back on or before 6/6. Please advise   Phone: [phone]  Fax: [phone]

## 2020-09-14 NOTE — Telephone Encounter (Signed)
Received fax from [facility] that patient assistance was approved through 03/14/21. Rybelsus 7 mg will be shipped to office for patient to pick up within 10-14 business days.

## 2020-09-19 ENCOUNTER — Other Ambulatory Visit: Payer: Self-pay | Admitting: Internal Medicine

## 2020-09-21 ENCOUNTER — Other Ambulatory Visit: Payer: Self-pay | Admitting: Internal Medicine

## 2020-09-26 NOTE — Telephone Encounter (Signed)
Patient called and was wondering if the addendum has been received and faxed back. Please advise

## 2020-09-27 NOTE — Telephone Encounter (Signed)
28 pages faxed to [facility] on 09/15/20 with fax conformation received.  This included the note that contained the addendum.

## 2020-09-28 NOTE — Telephone Encounter (Signed)
Called [facility] today and spoke with [facility] in billing. She states because Dr.Burns didn't sign and date right beside the addendum. Notes re-faxed today and page conformation again received. Was told once they receive it it would take 24-48 hours to process. I called and left message for patient to call me back on Monday if he hadn't heard anything back.

## 2020-10-09 ENCOUNTER — Telehealth: Payer: Self-pay

## 2020-10-09 NOTE — Telephone Encounter (Signed)
Called to inform pt his medication Rybelsus arrived in office today, no answer and no vm. Sent [employer].

## 2020-10-13 ENCOUNTER — Other Ambulatory Visit: Payer: Self-pay | Admitting: Internal Medicine

## 2020-10-13 NOTE — Telephone Encounter (Signed)
Check [place] registry last filled 09/21/2020.Marland KitchenJohny Chess

## 2020-10-19 ENCOUNTER — Telehealth: Payer: Self-pay | Admitting: Internal Medicine

## 2020-10-19 NOTE — Telephone Encounter (Signed)
Error

## 2020-10-19 NOTE — Telephone Encounter (Signed)
   Patient called and said that he has not heard anything from [facility]

## 2020-11-02 DIAGNOSIS — L602 Onychogryphosis: Secondary | ICD-10-CM | POA: Diagnosis not present

## 2020-11-02 DIAGNOSIS — E1351 Other specified diabetes mellitus with diabetic peripheral angiopathy without gangrene: Secondary | ICD-10-CM | POA: Diagnosis not present

## 2020-11-02 DIAGNOSIS — L84 Corns and callosities: Secondary | ICD-10-CM | POA: Diagnosis not present

## 2020-11-02 NOTE — Telephone Encounter (Signed)
My-chart message sent to patient and I called and left a message for him.

## 2020-11-02 NOTE — Telephone Encounter (Signed)
Spoke with Von today from [place].  States that previous order was cancelled out because it was under Dr. Rush Farmer.  Notes had been received from Dr. Quay Burow and they are reprocessing order. They attempted to contact patient before but no response.  They will process this (again as I have been told multiple times) and patient should receive it in the next week or so once it is shipped.  Patient to be contacted with tracking number once processed.

## 2020-11-06 NOTE — Telephone Encounter (Signed)
Last read by Lenore Cordia at 8:03 PM on 11/02/2020.

## 2020-11-07 DIAGNOSIS — Z794 Long term (current) use of insulin: Secondary | ICD-10-CM | POA: Diagnosis not present

## 2020-11-07 DIAGNOSIS — E119 Type 2 diabetes mellitus without complications: Secondary | ICD-10-CM | POA: Diagnosis not present

## 2020-11-08 ENCOUNTER — Other Ambulatory Visit: Payer: Self-pay | Admitting: Internal Medicine

## 2020-11-09 ENCOUNTER — Other Ambulatory Visit: Payer: Self-pay

## 2020-11-09 NOTE — Patient Instructions (Addendum)
  Blood work was ordered.     Medications changes include :   none      Please followup in 3 months

## 2020-11-09 NOTE — Progress Notes (Signed)
Subjective:    Patient ID: Brandon Schaefer, male    DOB: 1955/10/11, 65 y.o.   MRN: [id number]  HPI The patient is here for follow up of their chronic medical problems, including DM, htn, hichol, peripheral neuropathy d/t chemo/dm, leg edema, B12 def, anxiety   He was out of insulin for one week - the pharmacy was not able to get his insulin.  He is eating good.    Medications and allergies reviewed with patient and updated if appropriate.  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     carvedilol (COREG) 6.25 MG tablet TAKE 1 TABLET(6.25 MG) BY MOUTH TWICE DAILY WITH A MEAL 180 tablet 1   Continuous Blood Gluc Receiver (FREESTYLE LIBRE 2 READER) DEVI Use as directed to check sugars   E11.65 1 each 0   Continuous Blood Gluc Sensor (FREESTYLE LIBRE 14 DAY SENSOR) MISC Use as directed to check sugars  E11.65 28 each 5   CONTOUR NEXT TEST test strip USE TO CHECK BLOOD SUGAR THREE TIMES DAILY AS DIRECTED 300 strip 2   DULoxetine (CYMBALTA) 30 MG capsule TAKE 1 CAPSULE BY MOUTH DAILY 90 capsule 1   insulin lispro protamine-lispro (HUMALOG MIX 75/25) (75-25) 100 UNIT/ML SUSP injection INJECT 45 UNITS UNDER THE SKIN EVERY MORNING AND 40 UNITS AT NIGHT 80 mL 5   Insulin Syringe-Needle U-100 31G X  5/16" 1 ML MISC Use as directed twice daily for insulin 180 each 3   meloxicam (MOBIC) 15 MG tablet TAKE 1 TABLET(15 MG) BY MOUTH DAILY 90 tablet 1   metFORMIN (GLUCOPHAGE) 1000 MG tablet TAKE 1 TABLET(1000 MG) BY MOUTH TWICE DAILY WITH A MEAL 180 tablet 1   pregabalin (LYRICA) 100 MG capsule TAKE 1 CAPSULE(100 MG) BY MOUTH THREE TIMES DAILY 90 capsule 0   ramipril (ALTACE) 10 MG capsule TAKE 1 CAPSULE(10 MG) BY MOUTH DAILY BEFORE BREAKFAST 90 capsule 1   rosuvastatin (CRESTOR) 20 MG tablet TAKE 1 TABLET(20 MG) BY MOUTH DAILY BEFORE BREAKFAST 90 tablet 1   Semaglutide (RYBELSUS) 7 MG TABS Take 7 mg by mouth daily. 30 tablet 3   triamcinolone ointment (KENALOG) 0.1 % APPLY TOPICALLY TO LEGS TWICE DAILY FOR 2 WEEKS     No current facility-administered medications on file prior to visit.    Past Medical History:  Diagnosis Date   Allergy    Anemia    Anxiety    colon ca dx'd 11/2011   Dental abscess 08/03/2015   Diabetes mellitus    Headache(784.0)    History of blood transfusion    Hyperlipidemia    Hypertension    Neuromuscular disorder ([place])    peripheral neuropathy   Shortness of breath    with exertion    Past Surgical History:  Procedure Laterality  Date   COMPLEX WOUND CLOSURE N/A 01/06/2013   Procedure: EXCISION CHRONIC ABDOMINAL WOUND;  Surgeon: Harl Bowie, MD;  [facility];  Service: General;  Laterality: N/A;   CYSTOSCOPY W/ URETERAL STENT PLACEMENT Bilateral 01/04/2013   Procedure: CYSTOSCOPY WITH RETROGRADE PYELOGRAM/Right double J URETERAL STENT PLACEMENT;  Surgeon: Alexis Frock, MD;  [facility];  Service: Urology;  Laterality: Bilateral;   CYSTOSCOPY WITH RETROGRADE PYELOGRAM, URETEROSCOPY AND STENT PLACEMENT Right 01/06/2013   Procedure: CYSTOSCOPY WITH RIGHT RETROGRADE PYELOGRAM, URETEROSCOPY AND BILATERAL STENT EXCHANGE, BILATERAL STONE BASKET EXTRACTION ;  Surgeon: Alexis Frock, MD;  [facility];  Service: Urology;  Laterality: Right;    FRACTURE SURGERY      ORIF-left radius has pin   HOLMIUM LASER APPLICATION N/A [id number]   Procedure: HOLMIUM LASER APPLICATION;  Surgeon: Alexis Frock, MD;  [facility];  Service: Urology;  Laterality: N/A;   NEPHROLITHOTOMY Left 01/04/2013   Procedure: NEPHROLITHOTOMY PERCUTANEOUS  LEFT 1ST STAGE PERCUTANEOUS NEPHROSTOLITHOTOMY;  Surgeon: Alexis Frock, MD;  [facility];  Service: Urology;  Laterality: Left;   NEPHROLITHOTOMY Left 01/06/2013   Procedure: NEPHROLITHOTOMY PERCUTANEOUS SECOND LOOK;  Surgeon: Alexis Frock, MD;  [facility];  Service: Urology;  Laterality: Left;   PARTIAL COLECTOMY  01/17/2012   Procedure: PARTIAL COLECTOMY;  Surgeon: Harl Bowie, MD;  [facility];  Service: General;;   PILONIDAL CYST EXCISION     PORT-A-CATH REMOVAL Left 01/06/2013   Procedure: REMOVAL PORT-A-CATH;  Surgeon: Harl Bowie, MD;  [facility];  Service: General;  Laterality: Left;   PORTACATH PLACEMENT  03/19/2012   Procedure: INSERTION PORT-A-CATH;  Surgeon: Harl Bowie, MD;  Location: [place];  Service: General;  Laterality: N/A;   PROCTOSCOPY  01/17/2012   Procedure: PROCTOSCOPY;  Surgeon: Harl Bowie, MD;  [facility];  Service: General;;    Social History   Socioeconomic History   Marital status: Single    Spouse name: Not on file   Number of children: Not on file   Years of education: Not on file   Highest education level: Not on file  Occupational History   Not on file  Tobacco Use   Smoking status: Never   Smokeless tobacco: Never   Tobacco comments:    NEVER USED TOBACC0  Substance and Sexual Activity   Alcohol use: No    Alcohol/week: 0.0 standard drinks   Drug use: No   Sexual activity: Not on file  Other Topics Concern   Not on file  Social History Narrative   Not on file   Social Determinants of Health   Financial Resource Strain: Not on file  Food Insecurity: Not on file  Transportation  Needs: Not on file  Physical Activity: Not on file  Stress: Not on file  Social Connections: Not on file    Family History  Problem Relation Age of Onset   Diabetes Father    Cancer Maternal Aunt        colon   Cancer Maternal Grandmother        colon    Review of Systems  Constitutional:  Negative for chills and fever.  Respiratory:  Negative for cough, shortness of breath and wheezing.   Cardiovascular:  Positive for leg swelling (feet). Negative for chest pain and palpitations.  Neurological:  Negative for light-headedness and headaches.      Objective:   Vitals:   11/10/20 0751 11/10/20 0816  BP: (!) 150/62 (!) 146/78  Pulse: 100   Temp: 98.6 F (37 C)   SpO2: 95%    BP Readings from Last 3 Encounters:  11/10/20 (!) 146/78  08/11/20 (!) 148/88  05/12/20 140/80   Wt Readings from Last 3 Encounters:  11/10/20 (!) 409 lb (185.5 kg)  08/11/20 (!) 401 lb (181.9 kg)  05/12/20 (!) 399 lb (181 kg)   Body mass index is 55.47 kg/m.   Physical Exam    Constitutional: Appears well-developed and well-nourished. No distress.  HENT:  Head: Normocephalic and atraumatic.  Neck: Neck supple. No tracheal deviation present. No thyromegaly present.  No cervical lymphadenopathy Cardiovascular: Normal rate, regular rhythm and normal heart sounds.   No murmur heard. No carotid bruit .  Bilateral foot edema, no lower leg edema Pulmonary/Chest: Effort normal and breath sounds normal. No respiratory distress. No has no wheezes. No rales.  Skin: Skin is warm and dry. Not diaphoretic.  Psychiatric: Normal mood and affect. Behavior is normal.      Assessment & Plan:    See Problem List for Assessment and Plan of chronic medical problems.    This visit occurred during the SARS-CoV-2 public health emergency.  Safety protocols were in place, including screening questions prior to the visit, additional usage of staff PPE, and extensive cleaning of exam room while observing  appropriate contact time as indicated for disinfecting solutions.

## 2020-11-10 ENCOUNTER — Other Ambulatory Visit: Payer: Self-pay

## 2020-11-10 ENCOUNTER — Encounter: Payer: Self-pay | Admitting: Internal Medicine

## 2020-11-10 ENCOUNTER — Ambulatory Visit (INDEPENDENT_AMBULATORY_CARE_PROVIDER_SITE_OTHER): Payer: Medicare Other | Admitting: Internal Medicine

## 2020-11-10 VITALS — BP 146/78 | HR 100 | Temp 98.6°F | Ht 72.0 in | Wt >= 6400 oz

## 2020-11-10 DIAGNOSIS — Z794 Long term (current) use of insulin: Secondary | ICD-10-CM | POA: Diagnosis not present

## 2020-11-10 DIAGNOSIS — G622 Polyneuropathy due to other toxic agents: Secondary | ICD-10-CM

## 2020-11-10 DIAGNOSIS — E1142 Type 2 diabetes mellitus with diabetic polyneuropathy: Secondary | ICD-10-CM

## 2020-11-10 DIAGNOSIS — M170 Bilateral primary osteoarthritis of knee: Secondary | ICD-10-CM

## 2020-11-10 DIAGNOSIS — I10 Essential (primary) hypertension: Secondary | ICD-10-CM | POA: Diagnosis not present

## 2020-11-10 DIAGNOSIS — E538 Deficiency of other specified B group vitamins: Secondary | ICD-10-CM | POA: Diagnosis not present

## 2020-11-10 DIAGNOSIS — E7849 Other hyperlipidemia: Secondary | ICD-10-CM | POA: Diagnosis not present

## 2020-11-10 DIAGNOSIS — R600 Localized edema: Secondary | ICD-10-CM | POA: Diagnosis not present

## 2020-11-10 DIAGNOSIS — F419 Anxiety disorder, unspecified: Secondary | ICD-10-CM

## 2020-11-10 LAB — BASIC METABOLIC PANEL
BUN: 22 mg/dL (ref 6–23)
CO2: 25 mEq/L (ref 19–32)
Calcium: 9.1 mg/dL (ref 8.4–10.5)
Chloride: 103 mEq/L (ref 96–112)
Creatinine, Ser: 1.04 mg/dL (ref 0.40–1.50)
GFR: 74.68 mL/min (ref >60.00)
Glucose, Bld: 216 mg/dL — ABNORMAL HIGH (ref 70–99)
Potassium: 4.3 mEq/L (ref 3.5–5.1)
Sodium: 140 mEq/L (ref 135–145)

## 2020-11-10 LAB — HEMOGLOBIN A1C: Hgb A1c MFr Bld: 8.8 % — ABNORMAL HIGH (ref 4.6–6.5)

## 2020-11-10 NOTE — Assessment & Plan Note (Signed)
Chronic Continue B12 supplementation 

## 2020-11-10 NOTE — Assessment & Plan Note (Signed)
Chronic Check lipid panel  Continue Crestor 20 mg daily Regular exercise and healthy diet encouraged  

## 2020-11-10 NOTE — Assessment & Plan Note (Addendum)
Chronic Related to chemotherapy and diabetes is likely contributing Fairly controlled - good days and bad days Continue Cymbalta 30 mg daily Continue Lyrica 100 mg 3 times daily

## 2020-11-10 NOTE — Assessment & Plan Note (Signed)
Chronic Controlled, stable Continue Cymbalta 30 mg daily

## 2020-11-10 NOTE — Assessment & Plan Note (Addendum)
Chronic Lab Results  Component Value Date   HGBA1C 8.5 (H) 08/11/2020   Has not been well controlled and he did go without his insulin for 1 week Check A1c today To start Rybelsus 7 mg daily today, continue metformin 1000 mg twice daily and insulin Humalog 75/25 45 units every morning and 40 units every afternoon Stressed the importance of losing weight Deferred nutrition referral-he has seen them in the past and he knows what he needs to do

## 2020-11-10 NOTE — Assessment & Plan Note (Addendum)
Chronic Continue meloxicam 15 mg daily - it is helping Advised weight loss

## 2020-11-10 NOTE — Assessment & Plan Note (Signed)
Chronic Controlled Currently not on any medication

## 2020-11-10 NOTE — Assessment & Plan Note (Signed)
Chronic BP well controlled Continue Coreg 6.25 mg twice daily, ramipril 10 mg daily bmp

## 2020-11-16 ENCOUNTER — Other Ambulatory Visit: Payer: Self-pay | Admitting: Internal Medicine

## 2020-11-26 ENCOUNTER — Other Ambulatory Visit: Payer: Self-pay

## 2020-11-26 ENCOUNTER — Encounter (HOSPITAL_COMMUNITY): Payer: Self-pay

## 2020-11-26 ENCOUNTER — Emergency Department (HOSPITAL_COMMUNITY): Payer: Medicare Other

## 2020-11-26 ENCOUNTER — Inpatient Hospital Stay (HOSPITAL_COMMUNITY)
Admission: EM | Admit: 2020-11-26 | Discharge: 2020-12-06 | DRG: 854 | Disposition: A | Discharge status: Home Health | Payer: Medicare Other | Attending: Internal Medicine | Admitting: Internal Medicine

## 2020-11-26 DIAGNOSIS — Z20822 Contact with and (suspected) exposure to covid-19: Secondary | ICD-10-CM | POA: Diagnosis not present

## 2020-11-26 DIAGNOSIS — E1169 Type 2 diabetes mellitus with other specified complication: Secondary | ICD-10-CM

## 2020-11-26 DIAGNOSIS — M79642 Pain in left hand: Secondary | ICD-10-CM | POA: Diagnosis not present

## 2020-11-26 DIAGNOSIS — S0990XA Unspecified injury of head, initial encounter: Secondary | ICD-10-CM | POA: Diagnosis not present

## 2020-11-26 DIAGNOSIS — L03115 Cellulitis of right lower limb: Secondary | ICD-10-CM | POA: Diagnosis not present

## 2020-11-26 DIAGNOSIS — E876 Hypokalemia: Secondary | ICD-10-CM | POA: Diagnosis not present

## 2020-11-26 DIAGNOSIS — N202 Calculus of kidney with calculus of ureter: Secondary | ICD-10-CM | POA: Diagnosis not present

## 2020-11-26 DIAGNOSIS — W19XXXA Unspecified fall, initial encounter: Secondary | ICD-10-CM

## 2020-11-26 DIAGNOSIS — N132 Hydronephrosis with renal and ureteral calculous obstruction: Secondary | ICD-10-CM | POA: Diagnosis not present

## 2020-11-26 DIAGNOSIS — Z794 Long term (current) use of insulin: Secondary | ICD-10-CM

## 2020-11-26 DIAGNOSIS — Z6841 Body Mass Index (BMI) 40.0 and over, adult: Secondary | ICD-10-CM

## 2020-11-26 DIAGNOSIS — M170 Bilateral primary osteoarthritis of knee: Secondary | ICD-10-CM | POA: Diagnosis present

## 2020-11-26 DIAGNOSIS — K8020 Calculus of gallbladder without cholecystitis without obstruction: Secondary | ICD-10-CM | POA: Diagnosis not present

## 2020-11-26 DIAGNOSIS — R310 Gross hematuria: Secondary | ICD-10-CM | POA: Diagnosis present

## 2020-11-26 DIAGNOSIS — M25562 Pain in left knee: Secondary | ICD-10-CM | POA: Diagnosis not present

## 2020-11-26 DIAGNOSIS — E785 Hyperlipidemia, unspecified: Secondary | ICD-10-CM | POA: Diagnosis present

## 2020-11-26 DIAGNOSIS — R918 Other nonspecific abnormal finding of lung field: Secondary | ICD-10-CM | POA: Diagnosis present

## 2020-11-26 DIAGNOSIS — Z85038 Personal history of other malignant neoplasm of large intestine: Secondary | ICD-10-CM | POA: Diagnosis not present

## 2020-11-26 DIAGNOSIS — E1140 Type 2 diabetes mellitus with diabetic neuropathy, unspecified: Secondary | ICD-10-CM | POA: Diagnosis not present

## 2020-11-26 DIAGNOSIS — N179 Acute kidney failure, unspecified: Secondary | ICD-10-CM

## 2020-11-26 DIAGNOSIS — N200 Calculus of kidney: Secondary | ICD-10-CM | POA: Diagnosis not present

## 2020-11-26 DIAGNOSIS — Z833 Family history of diabetes mellitus: Secondary | ICD-10-CM

## 2020-11-26 DIAGNOSIS — Z7984 Long term (current) use of oral hypoglycemic drugs: Secondary | ICD-10-CM

## 2020-11-26 DIAGNOSIS — I152 Hypertension secondary to endocrine disorders: Secondary | ICD-10-CM | POA: Diagnosis present

## 2020-11-26 DIAGNOSIS — E872 Acidosis: Secondary | ICD-10-CM | POA: Diagnosis not present

## 2020-11-26 DIAGNOSIS — Z79899 Other long term (current) drug therapy: Secondary | ICD-10-CM

## 2020-11-26 DIAGNOSIS — I10 Essential (primary) hypertension: Secondary | ICD-10-CM | POA: Diagnosis present

## 2020-11-26 DIAGNOSIS — Z9049 Acquired absence of other specified parts of digestive tract: Secondary | ICD-10-CM

## 2020-11-26 DIAGNOSIS — N201 Calculus of ureter: Secondary | ICD-10-CM | POA: Diagnosis not present

## 2020-11-26 DIAGNOSIS — R6883 Chills (without fever): Secondary | ICD-10-CM | POA: Diagnosis present

## 2020-11-26 DIAGNOSIS — Z7982 Long term (current) use of aspirin: Secondary | ICD-10-CM

## 2020-11-26 DIAGNOSIS — N210 Calculus in bladder: Secondary | ICD-10-CM | POA: Diagnosis not present

## 2020-11-26 DIAGNOSIS — E1142 Type 2 diabetes mellitus with diabetic polyneuropathy: Secondary | ICD-10-CM

## 2020-11-26 DIAGNOSIS — E860 Dehydration: Secondary | ICD-10-CM | POA: Diagnosis not present

## 2020-11-26 DIAGNOSIS — E538 Deficiency of other specified B group vitamins: Secondary | ICD-10-CM | POA: Diagnosis present

## 2020-11-26 DIAGNOSIS — D509 Iron deficiency anemia, unspecified: Secondary | ICD-10-CM | POA: Diagnosis not present

## 2020-11-26 DIAGNOSIS — Z043 Encounter for examination and observation following other accident: Secondary | ICD-10-CM | POA: Diagnosis not present

## 2020-11-26 DIAGNOSIS — E119 Type 2 diabetes mellitus without complications: Secondary | ICD-10-CM

## 2020-11-26 DIAGNOSIS — R0902 Hypoxemia: Secondary | ICD-10-CM | POA: Diagnosis not present

## 2020-11-26 DIAGNOSIS — R319 Hematuria, unspecified: Secondary | ICD-10-CM | POA: Diagnosis not present

## 2020-11-26 DIAGNOSIS — A419 Sepsis, unspecified organism: Secondary | ICD-10-CM | POA: Diagnosis present

## 2020-11-26 DIAGNOSIS — M6282 Rhabdomyolysis: Secondary | ICD-10-CM

## 2020-11-26 DIAGNOSIS — R739 Hyperglycemia, unspecified: Secondary | ICD-10-CM | POA: Diagnosis not present

## 2020-11-26 DIAGNOSIS — M25561 Pain in right knee: Secondary | ICD-10-CM | POA: Diagnosis not present

## 2020-11-26 DIAGNOSIS — I6529 Occlusion and stenosis of unspecified carotid artery: Secondary | ICD-10-CM | POA: Diagnosis not present

## 2020-11-26 DIAGNOSIS — Z743 Need for continuous supervision: Secondary | ICD-10-CM | POA: Diagnosis not present

## 2020-11-26 DIAGNOSIS — G319 Degenerative disease of nervous system, unspecified: Secondary | ICD-10-CM | POA: Diagnosis not present

## 2020-11-26 DIAGNOSIS — M79641 Pain in right hand: Secondary | ICD-10-CM | POA: Diagnosis not present

## 2020-11-26 DIAGNOSIS — Z888 Allergy status to other drugs, medicaments and biological substances status: Secondary | ICD-10-CM

## 2020-11-26 HISTORY — DX: Family history of other specified conditions: Z84.89

## 2020-11-26 LAB — BLOOD GAS, VENOUS
Acid-base deficit: 1.4 mmol/L (ref 0.0–2.0)
Bicarbonate: 23.2 mmol/L (ref 20.0–28.0)
O2 Saturation: 91.7 %
Patient temperature: 98.6
pCO2, Ven: 41 mmHg — ABNORMAL LOW (ref 44.0–60.0)
pH, Ven: 7.371 (ref 7.250–7.430)
pO2, Ven: 67.1 mmHg — ABNORMAL HIGH (ref 32.0–45.0)

## 2020-11-26 MED ORDER — SODIUM CHLORIDE 0.9 % IV BOLUS
1000.0000 mL | Freq: Once | INTRAVENOUS | Status: AC
  Administered 2020-11-26: 1000 mL via INTRAVENOUS

## 2020-11-26 MED ORDER — DOXYCYCLINE HYCLATE 100 MG IV SOLR
100.0000 mg | Freq: Once | INTRAVENOUS | Status: AC
Start: 2020-11-26 — End: 2020-11-27
  Administered 2020-11-26: 100 mg via INTRAVENOUS
  Filled 2020-11-26: qty 100

## 2020-11-26 MED ORDER — KETOROLAC TROMETHAMINE 15 MG/ML IJ SOLN
15.0000 mg | Freq: Once | INTRAMUSCULAR | Status: AC
  Administered 2020-11-26: 15 mg via INTRAVENOUS
  Filled 2020-11-26: qty 1

## 2020-11-26 MED ORDER — HYDROCODONE-ACETAMINOPHEN 5-325 MG PO TABS
1.0000 | ORAL_TABLET | Freq: Once | ORAL | Status: AC
Start: 2020-11-26 — End: 2020-11-26
  Administered 2020-11-26: 1 via ORAL
  Filled 2020-11-26: qty 1

## 2020-11-26 NOTE — ED Notes (Signed)
Pt is a hard stick. Difficulty obtaining labs.

## 2020-11-26 NOTE — ED Triage Notes (Signed)
Patient arrives from home via EMS after 2 unwitnessed falls. Patient refused transport after first fall upon EMS arrival. Later in the evening EMS was called back out to home after fall where pt reports he fell to his knees. Pt reports pain in bilateral upper and lower extremities. Pt did not hit head and is not on any blood thinners.    EMS vitals: BP 1698 palpated HR 100 SPO2 90% CBG 389

## 2020-11-26 NOTE — ED Provider Notes (Addendum)
[facility] DEPT Provider Note   CSN: [id number] Arrival date & time: 11/26/20  2042     History Chief Complaint  Patient presents with   [name]    Brandon Schaefer is a 65 y.o. male.  Patient is a 65 year old male presenting for fall. Patient admits to having severe chills that resulted in him falling at home, first point of contact bilateral knee, still residual bilateral knee pain.  His head trauma, history of LOC, or history of blood thinner use.   The history is provided by the patient. No language interpreter was used.  Fall This is a new problem. The current episode started 3 to 5 hours ago. The problem has been resolved. Pertinent negatives include no chest pain, no abdominal pain and no shortness of breath.      Past Medical History:  Diagnosis Date   Allergy    Anemia    Anxiety    colon ca dx'd 11/2011   Dental abscess 08/03/2015   Diabetes mellitus    Headache(784.0)    History of blood transfusion    Hyperlipidemia    Hypertension    Neuromuscular disorder ([place])    peripheral neuropathy   Shortness of breath    with exertion    Patient Active Problem List   Diagnosis Date Noted   BMI 50.0-59.9, adult ([place]) 02/10/2020   History of nephrolithiasis 11/10/2019   B12 deficiency 05/11/2019   Tremor 01/27/2018   Right medial malleolar fracture 11/26/2015   Anxiety 11/26/2015   Hyperlipidemia 11/26/2015   Bilateral leg edema 08/22/2015   Back pain 03/20/2015   Anemia, iron deficiency 05/05/2012   DM (diabetes mellitus) ([place]) 01/18/2012   HTN (hypertension) 01/18/2012   Morbid obesity ([place]) 01/17/2012   Cancer of sigmoid colon ([place]) 12/23/2011   Arthritis of knee, degenerative 08/09/2011   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    Past Surgical History:  Procedure Laterality Date   COMPLEX WOUND CLOSURE N/A 01/06/2013   Procedure: EXCISION CHRONIC ABDOMINAL WOUND;  Surgeon: Harl Bowie, MD;  Location: [facility];  Service:  General;  Laterality: N/A;   CYSTOSCOPY W/ URETERAL STENT PLACEMENT Bilateral 01/04/2013   Procedure: CYSTOSCOPY WITH RETROGRADE PYELOGRAM/Right double J URETERAL STENT PLACEMENT;  Surgeon: Alexis Frock, MD;  Location: [facility];  Service: Urology;  Laterality: Bilateral;   CYSTOSCOPY WITH RETROGRADE PYELOGRAM, URETEROSCOPY AND STENT PLACEMENT Right 01/06/2013   Procedure: CYSTOSCOPY WITH RIGHT RETROGRADE PYELOGRAM, URETEROSCOPY AND BILATERAL STENT EXCHANGE, BILATERAL STONE BASKET EXTRACTION ;  Surgeon: Alexis Frock, MD;  Location: [facility];  Service: Urology;  Laterality: Right;   FRACTURE SURGERY      ORIF-left radius has pin   HOLMIUM LASER APPLICATION N/A [id number]   Procedure: HOLMIUM LASER APPLICATION;  Surgeon: Alexis Frock, MD;  Location: [facility];  Service: Urology;  Laterality: N/A;   NEPHROLITHOTOMY Left 01/04/2013   Procedure: NEPHROLITHOTOMY PERCUTANEOUS  LEFT 1ST STAGE PERCUTANEOUS NEPHROSTOLITHOTOMY;  Surgeon: Alexis Frock, MD;  Location: [facility];  Service: Urology;  Laterality: Left;   NEPHROLITHOTOMY Left 01/06/2013   Procedure: NEPHROLITHOTOMY PERCUTANEOUS SECOND LOOK;  Surgeon: Alexis Frock, MD;  Location: [facility];  Service: Urology;  Laterality: Left;   PARTIAL COLECTOMY  01/17/2012   Procedure: PARTIAL COLECTOMY;  Surgeon: Harl Bowie, MD;  Location: [facility];  Service: General;;   PILONIDAL CYST EXCISION     PORT-A-CATH REMOVAL Left 01/06/2013   Procedure: REMOVAL PORT-A-CATH;  Surgeon: Harl Bowie, MD;  Location: [facility];  Service: General;  Laterality: Left;  PORTACATH PLACEMENT  03/19/2012   Procedure: INSERTION PORT-A-CATH;  Surgeon: Harl Bowie, MD;  Location: [facility];  Service: General;  Laterality: N/A;   PROCTOSCOPY  01/17/2012   Procedure: PROCTOSCOPY;  Surgeon: Harl Bowie, MD;  Location: [facility];  Service: General;;       Family History  Problem Relation Age of Onset   Diabetes Father    Cancer Maternal Aunt         colon   Cancer Maternal Grandmother        colon    Social History   Tobacco Use   Smoking status: Never   Smokeless tobacco: Never   Tobacco comments:    NEVER USED TOBACC0  Substance Use Topics   Alcohol use: No    Alcohol/week: 0.0 standard drinks   Drug use: No    Home Medications Prior to Admission medications   Medication Sig Start Date End Date Taking? Authorizing Provider  aspirin EC 81 MG tablet Take 81 mg by mouth daily.    [provider]  carvedilol (COREG) 6.25 MG tablet TAKE 1 TABLET(6.25 MG) BY MOUTH TWICE DAILY WITH A MEAL 09/19/20   Burns, Claudina Lick, MD  Continuous Blood Gluc Receiver (FREESTYLE LIBRE 2 READER) DEVI Use as directed to check sugars   E11.65 08/11/20   Binnie Rail, MD  Continuous Blood Gluc Sensor (FREESTYLE LIBRE 14 DAY SENSOR) MISC Use as directed to check sugars  E11.65 08/11/20   Binnie Rail, MD  CONTOUR NEXT TEST test strip USE TO CHECK BLOOD SUGAR THREE TIMES DAILY AS DIRECTED 06/23/20   Burns, Claudina Lick, MD  DULoxetine (CYMBALTA) 30 MG capsule TAKE 1 CAPSULE BY MOUTH DAILY 06/19/20   Binnie Rail, MD  insulin lispro protamine-lispro (HUMALOG MIX 75/25) (75-25) 100 UNIT/ML SUSP injection INJECT 45 UNITS UNDER THE SKIN EVERY MORNING AND 40 UNITS AT NIGHT 08/11/20   Binnie Rail, MD  Insulin Syringe-Needle U-100 (INSULIN SYRINGE 1CC/31GX5/16") 31G X 5/16" 1 ML MISC USE AS DIRECTED TWICE DAILY FOR INSULIN 11/16/20   Binnie Rail, MD  meloxicam (MOBIC) 15 MG tablet TAKE 1 TABLET(15 MG) BY MOUTH DAILY 09/19/20   Binnie Rail, MD  metFORMIN (GLUCOPHAGE) 1000 MG tablet TAKE 1 TABLET(1000 MG) BY MOUTH TWICE DAILY WITH A MEAL 06/19/20   Burns, Claudina Lick, MD  pregabalin (LYRICA) 100 MG capsule TAKE 1 CAPSULE(100 MG) BY MOUTH THREE TIMES DAILY 11/09/20   Binnie Rail, MD  ramipril (ALTACE) 10 MG capsule TAKE 1 CAPSULE(10 MG) BY MOUTH DAILY BEFORE BREAKFAST 05/08/20   Burns, Claudina Lick, MD  rosuvastatin (CRESTOR) 20 MG tablet TAKE 1 TABLET(20 MG) BY MOUTH  DAILY BEFORE BREAKFAST 09/19/20   Burns, Claudina Lick, MD  Semaglutide (RYBELSUS) 7 MG TABS Take 7 mg by mouth daily. 08/16/20   Binnie Rail, MD  triamcinolone ointment (KENALOG) 0.1 % APPLY TOPICALLY TO LEGS TWICE DAILY FOR 2 WEEKS 06/23/20   [provider]    Allergies    Adhesive [tape]  Review of Systems   Review of Systems  Constitutional:  Negative for chills and fever.  HENT:  Negative for ear pain and sore throat.   Eyes:  Negative for pain and visual disturbance.  Respiratory:  Negative for cough and shortness of breath.   Cardiovascular:  Negative for chest pain and palpitations.  Gastrointestinal:  Negative for abdominal pain and vomiting.  Genitourinary:  Negative for dysuria and hematuria.  Musculoskeletal:  Negative for arthralgias  and back pain.       Bilateral knee pain   Skin:  Negative for color change and rash.  Neurological:  Negative for seizures and syncope.  All other systems reviewed and are negative.  Physical Exam Updated Vital Signs BP 123/65   Pulse (!) 119   Temp (!) 97.4 F (36.3 C) (Oral)   Resp (!) 22   Ht 6' (1.829 m)   Wt (!) 190.5 kg   SpO2 98%   BMI 56.96 kg/m   Physical Exam Vitals and nursing note reviewed.  Constitutional:      Appearance: He is morbidly obese. He is ill-appearing and toxic-appearing.     Comments: Shivering   HENT:     Head: Normocephalic and atraumatic.  Eyes:     Conjunctiva/sclera: Conjunctivae normal.  Cardiovascular:     Rate and Rhythm: Regular rhythm. Tachycardia present.     Heart sounds: No murmur heard. Pulmonary:     Effort: Pulmonary effort is normal. No respiratory distress.     Breath sounds: Normal breath sounds.  Abdominal:     Palpations: Abdomen is soft.     Tenderness: There is no abdominal tenderness.  Musculoskeletal:     Cervical back: Neck supple.     Right knee: Bony tenderness present. No deformity.     Left knee: Bony tenderness present. No deformity.  Skin:    General:  Skin is warm and dry.       Neurological:     Mental Status: He is alert.    ED Results / Procedures / Treatments   Labs (all labs ordered are listed, but only abnormal results are displayed) Labs Reviewed - No data to display  EKG None  Radiology No results found.  Procedures .Critical Care Performed by: Lianne Cure, DO Authorized by: Lianne Cure, DO   Critical care provider statement:    Critical care time (minutes):  45   Critical care was necessary to treat or prevent imminent or life-threatening deterioration of the following conditions: sepsis.   Critical care was time spent personally by me on the following activities:  Discussions with consultants, evaluation of patient's response to treatment, examination of patient, ordering and performing treatments and interventions, ordering and review of laboratory studies, ordering and review of radiographic studies, pulse oximetry, re-evaluation of patient's condition, obtaining history from patient or surrogate and review of old charts   Medications Ordered in ED Medications  sodium chloride 0.9 % bolus 1,000 mL (has no administration in time range)    ED Course  I have reviewed the triage vital signs and the nursing notes.  Pertinent labs & imaging results that were available during my care of the patient were reviewed by me and considered in my medical decision making (see chart for details).    MDM Rules/Calculators/A&P                          9:48 PM 65 yo male presenting for fall. On exam patient is warm to touch, shivering, tachycardic, with cellulitis of right lower extremity. Concerns for sepsis. Laboratory studies including blood cx ordered and pending. IV fluids and doxycyline IV started.    11:08 PM Still pending labs at this time. Nursing personal states patient has an IV that doesn't draw blood. ED [place] broken, unable to do US guided IV. Line team called.   Patient signed out to oncoming physician  while awaiting blood work.  Final Clinical Impression(s) / ED Diagnoses Final diagnoses:  Fall, initial encounter  Acute pain of both knees  Sepsis, due to unspecified organism, unspecified whether acute organ dysfunction present [facility])  Cellulitis of right lower extremity    Rx / DC Orders ED Discharge Orders     None        Lianne Cure, DO [id number]    Lianne Cure, DO [id number]

## 2020-11-27 ENCOUNTER — Inpatient Hospital Stay (HOSPITAL_COMMUNITY): Payer: Medicare Other | Admitting: Anesthesiology

## 2020-11-27 ENCOUNTER — Inpatient Hospital Stay (HOSPITAL_COMMUNITY): Payer: Medicare Other

## 2020-11-27 ENCOUNTER — Emergency Department (HOSPITAL_COMMUNITY): Payer: Medicare Other

## 2020-11-27 ENCOUNTER — Encounter (HOSPITAL_COMMUNITY): Payer: Self-pay | Admitting: Internal Medicine

## 2020-11-27 ENCOUNTER — Encounter (HOSPITAL_COMMUNITY)
Admission: EM | Disposition: A | Discharge status: Home Health | Payer: Self-pay | Source: Home / Self Care | Attending: Family Medicine

## 2020-11-27 DIAGNOSIS — A419 Sepsis, unspecified organism: Secondary | ICD-10-CM | POA: Diagnosis present

## 2020-11-27 DIAGNOSIS — R310 Gross hematuria: Secondary | ICD-10-CM | POA: Diagnosis present

## 2020-11-27 DIAGNOSIS — Z9049 Acquired absence of other specified parts of digestive tract: Secondary | ICD-10-CM | POA: Diagnosis not present

## 2020-11-27 DIAGNOSIS — E860 Dehydration: Secondary | ICD-10-CM | POA: Diagnosis present

## 2020-11-27 DIAGNOSIS — M6282 Rhabdomyolysis: Secondary | ICD-10-CM | POA: Diagnosis present

## 2020-11-27 DIAGNOSIS — Z7984 Long term (current) use of oral hypoglycemic drugs: Secondary | ICD-10-CM | POA: Diagnosis not present

## 2020-11-27 DIAGNOSIS — N179 Acute kidney failure, unspecified: Secondary | ICD-10-CM | POA: Diagnosis present

## 2020-11-27 DIAGNOSIS — E1142 Type 2 diabetes mellitus with diabetic polyneuropathy: Secondary | ICD-10-CM | POA: Diagnosis not present

## 2020-11-27 DIAGNOSIS — E538 Deficiency of other specified B group vitamins: Secondary | ICD-10-CM | POA: Diagnosis present

## 2020-11-27 DIAGNOSIS — E1140 Type 2 diabetes mellitus with diabetic neuropathy, unspecified: Secondary | ICD-10-CM | POA: Diagnosis present

## 2020-11-27 DIAGNOSIS — E785 Hyperlipidemia, unspecified: Secondary | ICD-10-CM | POA: Diagnosis present

## 2020-11-27 DIAGNOSIS — M170 Bilateral primary osteoarthritis of knee: Secondary | ICD-10-CM | POA: Diagnosis present

## 2020-11-27 DIAGNOSIS — N202 Calculus of kidney with calculus of ureter: Secondary | ICD-10-CM | POA: Diagnosis present

## 2020-11-27 DIAGNOSIS — Z6841 Body Mass Index (BMI) 40.0 and over, adult: Secondary | ICD-10-CM | POA: Diagnosis not present

## 2020-11-27 DIAGNOSIS — N210 Calculus in bladder: Secondary | ICD-10-CM | POA: Diagnosis present

## 2020-11-27 DIAGNOSIS — Z20822 Contact with and (suspected) exposure to covid-19: Secondary | ICD-10-CM | POA: Diagnosis present

## 2020-11-27 DIAGNOSIS — L03115 Cellulitis of right lower limb: Secondary | ICD-10-CM | POA: Diagnosis present

## 2020-11-27 DIAGNOSIS — K8020 Calculus of gallbladder without cholecystitis without obstruction: Secondary | ICD-10-CM | POA: Diagnosis present

## 2020-11-27 DIAGNOSIS — E872 Acidosis: Secondary | ICD-10-CM | POA: Diagnosis present

## 2020-11-27 DIAGNOSIS — E876 Hypokalemia: Secondary | ICD-10-CM | POA: Diagnosis not present

## 2020-11-27 DIAGNOSIS — R918 Other nonspecific abnormal finding of lung field: Secondary | ICD-10-CM | POA: Diagnosis present

## 2020-11-27 DIAGNOSIS — R6883 Chills (without fever): Secondary | ICD-10-CM | POA: Diagnosis present

## 2020-11-27 DIAGNOSIS — Z833 Family history of diabetes mellitus: Secondary | ICD-10-CM | POA: Diagnosis not present

## 2020-11-27 DIAGNOSIS — I10 Essential (primary) hypertension: Secondary | ICD-10-CM | POA: Diagnosis present

## 2020-11-27 DIAGNOSIS — Z85038 Personal history of other malignant neoplasm of large intestine: Secondary | ICD-10-CM | POA: Diagnosis not present

## 2020-11-27 HISTORY — PX: CYSTOSCOPY W/ URETERAL STENT PLACEMENT: SHX1429

## 2020-11-27 LAB — URINALYSIS, ROUTINE W REFLEX MICROSCOPIC
Bacteria, UA: NONE SEEN
Bilirubin Urine: NEGATIVE
Glucose, UA: >=500 mg/dL — AB
Ketones, ur: NEGATIVE mg/dL
Leukocytes,Ua: NEGATIVE
Nitrite: NEGATIVE
Protein, ur: 100 mg/dL — AB
RBC / HPF: >50 RBC/hpf — ABNORMAL HIGH (ref 0–5)
Specific Gravity, Urine: 1.018 (ref 1.005–1.030)
pH: 6 (ref 5.0–8.0)

## 2020-11-27 LAB — CBC WITH DIFFERENTIAL/PLATELET
Abs Immature Granulocytes: 0.4 10*3/uL — ABNORMAL HIGH (ref 0.00–0.07)
Abs Immature Granulocytes: 0.28 10*3/uL — ABNORMAL HIGH (ref 0.00–0.07)
Basophils Absolute: 0.1 10*3/uL (ref 0.0–0.1)
Basophils Absolute: 0.1 10*3/uL (ref 0.0–0.1)
Basophils Relative: 0 %
Basophils Relative: 0 %
Eosinophils Absolute: 0 10*3/uL (ref 0.0–0.5)
Eosinophils Absolute: 0.1 10*3/uL (ref 0.0–0.5)
Eosinophils Relative: 0 %
Eosinophils Relative: 1 %
HCT: 39.7 % (ref 39.0–52.0)
HCT: 39.3 % (ref 39.0–52.0)
Hemoglobin: 13.4 g/dL (ref 13.0–17.0)
Hemoglobin: 13.1 g/dL (ref 13.0–17.0)
Immature Granulocytes: 2 %
Immature Granulocytes: 1 %
Lymphocytes Relative: 2 %
Lymphocytes Relative: 4 %
Lymphs Abs: 0.5 10*3/uL — ABNORMAL LOW (ref 0.7–4.0)
Lymphs Abs: 0.8 10*3/uL (ref 0.7–4.0)
MCH: 28.3 pg (ref 26.0–34.0)
MCH: 28.2 pg (ref 26.0–34.0)
MCHC: 33.8 g/dL (ref 30.0–36.0)
MCHC: 33.3 g/dL (ref 30.0–36.0)
MCV: 83.8 fL (ref 80.0–100.0)
MCV: 84.5 fL (ref 80.0–100.0)
Monocytes Absolute: 0.9 10*3/uL (ref 0.1–1.0)
Monocytes Absolute: 0.8 10*3/uL (ref 0.1–1.0)
Monocytes Relative: 3 %
Monocytes Relative: 4 %
Neutro Abs: 23.6 10*3/uL — ABNORMAL HIGH (ref 1.7–7.7)
Neutro Abs: 17.9 10*3/uL — ABNORMAL HIGH (ref 1.7–7.7)
Neutrophils Relative %: 93 %
Neutrophils Relative %: 90 %
Platelets: 218 10*3/uL (ref 150–400)
Platelets: 210 10*3/uL (ref 150–400)
RBC: 4.74 MIL/uL (ref 4.22–5.81)
RBC: 4.65 MIL/uL (ref 4.22–5.81)
RDW: 13.5 % (ref 11.5–15.5)
RDW: 13.9 % (ref 11.5–15.5)
WBC: 25.5 10*3/uL — ABNORMAL HIGH (ref 4.0–10.5)
WBC: 20 10*3/uL — ABNORMAL HIGH (ref 4.0–10.5)
nRBC: 0 % (ref 0.0–0.2)
nRBC: 0 % (ref 0.0–0.2)

## 2020-11-27 LAB — COMPREHENSIVE METABOLIC PANEL
ALT: 52 U/L — ABNORMAL HIGH (ref 0–44)
AST: 178 U/L — ABNORMAL HIGH (ref 15–41)
Albumin: 3.1 g/dL — ABNORMAL LOW (ref 3.5–5.0)
Alkaline Phosphatase: 49 U/L (ref 38–126)
Anion gap: 9 (ref 5–15)
BUN: 24 mg/dL — ABNORMAL HIGH (ref 8–23)
CO2: 21 mmol/L — ABNORMAL LOW (ref 22–32)
Calcium: 8.3 mg/dL — ABNORMAL LOW (ref 8.9–10.3)
Chloride: 107 mmol/L (ref 98–111)
Creatinine, Ser: 1.27 mg/dL — ABNORMAL HIGH (ref 0.61–1.24)
GFR, Estimated: >60 mL/min (ref >60)
Glucose, Bld: 264 mg/dL — ABNORMAL HIGH (ref 70–99)
Potassium: 4.1 mmol/L (ref 3.5–5.1)
Sodium: 137 mmol/L (ref 135–145)
Total Bilirubin: 0.6 mg/dL (ref 0.3–1.2)
Total Protein: 5.9 g/dL — ABNORMAL LOW (ref 6.5–8.1)

## 2020-11-27 LAB — BASIC METABOLIC PANEL
Anion gap: 8 (ref 5–15)
BUN: 26 mg/dL — ABNORMAL HIGH (ref 8–23)
CO2: 24 mmol/L (ref 22–32)
Calcium: 8.8 mg/dL — ABNORMAL LOW (ref 8.9–10.3)
Chloride: 104 mmol/L (ref 98–111)
Creatinine, Ser: 1.39 mg/dL — ABNORMAL HIGH (ref 0.61–1.24)
GFR, Estimated: 56 mL/min — ABNORMAL LOW (ref >60)
Glucose, Bld: 332 mg/dL — ABNORMAL HIGH (ref 70–99)
Potassium: 4.4 mmol/L (ref 3.5–5.1)
Sodium: 136 mmol/L (ref 135–145)

## 2020-11-27 LAB — LACTIC ACID, PLASMA
Lactic Acid, Venous: 2.6 mmol/L (ref 0.5–1.9)
Lactic Acid, Venous: 2.3 mmol/L (ref 0.5–1.9)
Lactic Acid, Venous: 2 mmol/L (ref 0.5–1.9)

## 2020-11-27 LAB — RESP PANEL BY RT-PCR (FLU A&B, COVID) ARPGX2
Influenza A by PCR: NEGATIVE
Influenza B by PCR: NEGATIVE
SARS Coronavirus 2 by RT PCR: NEGATIVE

## 2020-11-27 LAB — PROCALCITONIN: Procalcitonin: 14.95 ng/mL

## 2020-11-27 LAB — GLUCOSE, CAPILLARY
Glucose-Capillary: 237 mg/dL — ABNORMAL HIGH (ref 70–99)
Glucose-Capillary: 228 mg/dL — ABNORMAL HIGH (ref 70–99)
Glucose-Capillary: 219 mg/dL — ABNORMAL HIGH (ref 70–99)

## 2020-11-27 LAB — CBG MONITORING, ED
Glucose-Capillary: 227 mg/dL — ABNORMAL HIGH (ref 70–99)
Glucose-Capillary: 244 mg/dL — ABNORMAL HIGH (ref 70–99)
Glucose-Capillary: 257 mg/dL — ABNORMAL HIGH (ref 70–99)

## 2020-11-27 LAB — HIV ANTIBODY (ROUTINE TESTING W REFLEX): HIV Screen 4th Generation wRfx: NONREACTIVE

## 2020-11-27 LAB — CK: Total CK: 10541 U/L — ABNORMAL HIGH (ref 49–397)

## 2020-11-27 SURGERY — CYSTOSCOPY, WITH RETROGRADE PYELOGRAM AND URETERAL STENT INSERTION
Anesthesia: General | Site: Ureter | Laterality: Left

## 2020-11-27 MED ORDER — LIDOCAINE 2% (20 MG/ML) 5 ML SYRINGE
INTRAMUSCULAR | Status: AC
  Filled 2020-11-27: qty 5

## 2020-11-27 MED ORDER — OXYCODONE HCL 5 MG PO TABS
5.0000 mg | ORAL_TABLET | Freq: Once | ORAL | Status: AC
  Administered 2020-11-27: 5 mg via ORAL
  Filled 2020-11-27: qty 1

## 2020-11-27 MED ORDER — ACETAMINOPHEN 500 MG PO TABS
1000.0000 mg | ORAL_TABLET | Freq: Once | ORAL | Status: DC
Start: 2020-11-27 — End: 2020-11-28

## 2020-11-27 MED ORDER — PROMETHAZINE HCL 25 MG/ML IJ SOLN
6.2500 mg | INTRAMUSCULAR | Status: DC | PRN
Start: 2020-11-27 — End: 2020-11-28

## 2020-11-27 MED ORDER — CHLORHEXIDINE GLUCONATE 0.12 % MT SOLN
15.0000 mL | Freq: Once | OROMUCOSAL | Status: AC
  Administered 2020-11-27: 15 mL via OROMUCOSAL

## 2020-11-27 MED ORDER — LIDOCAINE 2% (20 MG/ML) 5 ML SYRINGE
INTRAMUSCULAR | Status: DC | PRN
Start: 2020-11-27 — End: 2020-11-27
  Administered 2020-11-27: 100 mg via INTRAVENOUS

## 2020-11-27 MED ORDER — 0.9 % SODIUM CHLORIDE (POUR BTL) OPTIME
TOPICAL | Status: DC | PRN
  Administered 2020-11-27: 1000 mL

## 2020-11-27 MED ORDER — TAMSULOSIN HCL 0.4 MG PO CAPS
0.4000 mg | ORAL_CAPSULE | Freq: Every day | ORAL | Status: DC
  Administered 2020-11-28 – 2020-12-05 (×8): 0.4 mg via ORAL
  Filled 2020-11-27 (×9): qty 1

## 2020-11-27 MED ORDER — ACETAMINOPHEN 500 MG PO TABS
ORAL_TABLET | ORAL | Status: AC
  Administered 2020-11-27: 500 mg
  Filled 2020-11-27: qty 2

## 2020-11-27 MED ORDER — ONDANSETRON HCL 4 MG/2ML IJ SOLN
INTRAMUSCULAR | Status: DC | PRN
  Administered 2020-11-27: 4 mg via INTRAVENOUS

## 2020-11-27 MED ORDER — HYDROCODONE-ACETAMINOPHEN 5-325 MG PO TABS
1.0000 | ORAL_TABLET | Freq: Once | ORAL | Status: AC
  Administered 2020-11-27: 1 via ORAL
  Filled 2020-11-27: qty 1

## 2020-11-27 MED ORDER — MIDAZOLAM HCL 2 MG/2ML IJ SOLN
INTRAMUSCULAR | Status: AC
  Filled 2020-11-27: qty 2

## 2020-11-27 MED ORDER — SUCCINYLCHOLINE CHLORIDE 200 MG/10ML IV SOSY
PREFILLED_SYRINGE | INTRAVENOUS | Status: AC
  Filled 2020-11-27: qty 10

## 2020-11-27 MED ORDER — DULOXETINE HCL 30 MG PO CPEP
30.0000 mg | ORAL_CAPSULE | Freq: Every day | ORAL | Status: DC
  Administered 2020-11-27 – 2020-12-06 (×10): 30 mg via ORAL
  Filled 2020-11-27 (×10): qty 1

## 2020-11-27 MED ORDER — SODIUM CHLORIDE 0.9 % IR SOLN
Status: DC | PRN
  Administered 2020-11-27: 3000 mL

## 2020-11-27 MED ORDER — FENTANYL CITRATE (PF) 100 MCG/2ML IJ SOLN: 25.0000 ug | INTRAMUSCULAR | Status: DC | PRN

## 2020-11-27 MED ORDER — ROSUVASTATIN CALCIUM 20 MG PO TABS
20.0000 mg | ORAL_TABLET | Freq: Every day | ORAL | Status: DC
  Administered 2020-11-27: 20 mg via ORAL
  Filled 2020-11-27: qty 1

## 2020-11-27 MED ORDER — INSULIN ASPART 100 UNIT/ML IJ SOLN
INTRAMUSCULAR | Status: AC
  Filled 2020-11-27: qty 1

## 2020-11-27 MED ORDER — HYDROCODONE-ACETAMINOPHEN 5-325 MG PO TABS
1.0000 | ORAL_TABLET | Freq: Four times a day (QID) | ORAL | Status: DC | PRN
  Administered 2020-11-27 – 2020-11-29 (×7): 1 via ORAL
  Filled 2020-11-27 (×6): qty 1

## 2020-11-27 MED ORDER — LACTATED RINGERS IV SOLN: INTRAVENOUS | Status: AC

## 2020-11-27 MED ORDER — INSULIN ASPART PROT & ASPART (70-30 MIX) 100 UNIT/ML ~~LOC~~ SUSP: 45.0000 [IU] | Freq: Every day | SUBCUTANEOUS | Status: DC

## 2020-11-27 MED ORDER — DEXAMETHASONE SODIUM PHOSPHATE 10 MG/ML IJ SOLN
INTRAMUSCULAR | Status: AC
  Filled 2020-11-27: qty 1

## 2020-11-27 MED ORDER — PREGABALIN 100 MG PO CAPS
100.0000 mg | ORAL_CAPSULE | Freq: Three times a day (TID) | ORAL | Status: DC
  Administered 2020-11-27 – 2020-12-06 (×28): 100 mg via ORAL
  Filled 2020-11-27 (×8): qty 1
  Filled 2020-11-27: qty 2
  Filled 2020-11-27 (×9): qty 1
  Filled 2020-11-27: qty 2
  Filled 2020-11-27 (×6): qty 1
  Filled 2020-11-27: qty 2
  Filled 2020-11-27 (×2): qty 1

## 2020-11-27 MED ORDER — INSULIN DETEMIR 100 UNIT/ML ~~LOC~~ SOLN
20.0000 [IU] | Freq: Two times a day (BID) | SUBCUTANEOUS | Status: DC
  Administered 2020-11-27 – 2020-12-03 (×13): 20 [IU] via SUBCUTANEOUS
  Filled 2020-11-27 (×14): qty 0.2

## 2020-11-27 MED ORDER — PROPOFOL 10 MG/ML IV BOLUS
INTRAVENOUS | Status: AC
  Filled 2020-11-27: qty 40

## 2020-11-27 MED ORDER — INSULIN ASPART 100 UNIT/ML IJ SOLN
0.0000 [IU] | INTRAMUSCULAR | Status: DC
Start: 2020-11-27 — End: 2020-12-06
  Administered 2020-11-27 – 2020-11-28 (×2): 5 [IU] via SUBCUTANEOUS
  Administered 2020-11-28: 3 [IU] via SUBCUTANEOUS
  Administered 2020-11-28 (×4): 5 [IU] via SUBCUTANEOUS
  Administered 2020-11-29 (×2): 3 [IU] via SUBCUTANEOUS
  Administered 2020-11-29 – 2020-11-30 (×3): 5 [IU] via SUBCUTANEOUS
  Administered 2020-11-30: 3 [IU] via SUBCUTANEOUS
  Administered 2020-11-30 (×2): 5 [IU] via SUBCUTANEOUS
  Administered 2020-11-30 – 2020-12-01 (×3): 3 [IU] via SUBCUTANEOUS
  Administered 2020-12-01: 5 [IU] via SUBCUTANEOUS
  Administered 2020-12-01 (×3): 3 [IU] via SUBCUTANEOUS
  Administered 2020-12-02: 5 [IU] via SUBCUTANEOUS
  Administered 2020-12-02: 3 [IU] via SUBCUTANEOUS
  Administered 2020-12-02: 5 [IU] via SUBCUTANEOUS
  Administered 2020-12-02: 3 [IU] via SUBCUTANEOUS
  Administered 2020-12-02: 5 [IU] via SUBCUTANEOUS
  Administered 2020-12-03: 3 [IU] via SUBCUTANEOUS
  Administered 2020-12-03: 5 [IU] via SUBCUTANEOUS
  Administered 2020-12-03: 3 [IU] via SUBCUTANEOUS
  Administered 2020-12-03 (×2): 5 [IU] via SUBCUTANEOUS
  Administered 2020-12-03 – 2020-12-04 (×2): 3 [IU] via SUBCUTANEOUS
  Administered 2020-12-04 (×2): 5 [IU] via SUBCUTANEOUS
  Administered 2020-12-04: 8 [IU] via SUBCUTANEOUS
  Administered 2020-12-04 – 2020-12-05 (×3): 3 [IU] via SUBCUTANEOUS
  Administered 2020-12-05: 8 [IU] via SUBCUTANEOUS
  Administered 2020-12-05 (×3): 3 [IU] via SUBCUTANEOUS
  Administered 2020-12-05: 5 [IU] via SUBCUTANEOUS
  Administered 2020-12-06 (×4): 3 [IU] via SUBCUTANEOUS
  Filled 2020-11-27: qty 0.15

## 2020-11-27 MED ORDER — ACETAMINOPHEN 325 MG PO TABS
650.0000 mg | ORAL_TABLET | Freq: Four times a day (QID) | ORAL | Status: DC | PRN
  Administered 2020-11-28 – 2020-11-29 (×2): 650 mg via ORAL
  Filled 2020-11-27 (×2): qty 2

## 2020-11-27 MED ORDER — VANCOMYCIN HCL 2000 MG/400ML IV SOLN
2000.0000 mg | Freq: Once | INTRAVENOUS | Status: AC
  Administered 2020-11-27: 2000 mg via INTRAVENOUS
  Filled 2020-11-27: qty 400

## 2020-11-27 MED ORDER — FENTANYL CITRATE (PF) 100 MCG/2ML IJ SOLN
INTRAMUSCULAR | Status: DC | PRN
  Administered 2020-11-27: 100 ug via INTRAVENOUS

## 2020-11-27 MED ORDER — SUCCINYLCHOLINE CHLORIDE 200 MG/10ML IV SOSY
PREFILLED_SYRINGE | INTRAVENOUS | Status: DC | PRN
  Administered 2020-11-27: 200 mg via INTRAVENOUS

## 2020-11-27 MED ORDER — SODIUM CHLORIDE 0.9 % IV SOLN
2.0000 g | Freq: Three times a day (TID) | INTRAVENOUS | Status: DC
  Administered 2020-11-27 – 2020-11-29 (×7): 2 g via INTRAVENOUS
  Filled 2020-11-27 (×8): qty 2

## 2020-11-27 MED ORDER — INSULIN ASPART 100 UNIT/ML IJ SOLN
0.0000 [IU] | Freq: Three times a day (TID) | INTRAMUSCULAR | Status: DC
  Administered 2020-11-27 (×2): 3 [IU] via SUBCUTANEOUS
  Filled 2020-11-27: qty 0.09

## 2020-11-27 MED ORDER — INSULIN ASPART 100 UNIT/ML IJ SOLN
6.0000 [IU] | Freq: Once | INTRAMUSCULAR | Status: AC
  Administered 2020-11-27: 6 [IU] via SUBCUTANEOUS

## 2020-11-27 MED ORDER — ONDANSETRON HCL 4 MG/2ML IJ SOLN
INTRAMUSCULAR | Status: AC
  Filled 2020-11-27: qty 2

## 2020-11-27 MED ORDER — OXYCODONE HCL 5 MG/5ML PO SOLN
5.0000 mg | Freq: Once | ORAL | Status: DC | PRN
Start: 2020-11-27 — End: 2020-11-28

## 2020-11-27 MED ORDER — HYDROCODONE-ACETAMINOPHEN 5-325 MG PO TABS
ORAL_TABLET | ORAL | Status: AC
  Administered 2020-11-28: 1 via ORAL
  Filled 2020-11-27: qty 1

## 2020-11-27 MED ORDER — OXYCODONE HCL 5 MG PO TABS: 5.0000 mg | ORAL_TABLET | Freq: Once | ORAL | Status: DC | PRN

## 2020-11-27 MED ORDER — VANCOMYCIN HCL 10 G IV SOLR
2500.0000 mg | INTRAVENOUS | Status: DC
  Administered 2020-11-28: 2500 mg via INTRAVENOUS
  Filled 2020-11-27: qty 2000

## 2020-11-27 MED ORDER — ACETAMINOPHEN 650 MG RE SUPP: 650.0000 mg | Freq: Four times a day (QID) | RECTAL | Status: DC | PRN

## 2020-11-27 MED ORDER — ROCURONIUM BROMIDE 10 MG/ML (PF) SYRINGE
PREFILLED_SYRINGE | INTRAVENOUS | Status: DC | PRN
  Administered 2020-11-27: 20 mg via INTRAVENOUS

## 2020-11-27 MED ORDER — CARVEDILOL 3.125 MG PO TABS
6.2500 mg | ORAL_TABLET | Freq: Two times a day (BID) | ORAL | Status: DC
  Administered 2020-11-27: 6.25 mg via ORAL
  Filled 2020-11-27: qty 2

## 2020-11-27 MED ORDER — IOHEXOL 300 MG/ML  SOLN
INTRAMUSCULAR | Status: DC | PRN
  Administered 2020-11-27: 10 mL via URETHRAL

## 2020-11-27 MED ORDER — FENTANYL CITRATE (PF) 250 MCG/5ML IJ SOLN
INTRAMUSCULAR | Status: AC
  Filled 2020-11-27: qty 5

## 2020-11-27 MED ORDER — INSULIN ASPART PROT & ASPART (70-30 MIX) 100 UNIT/ML ~~LOC~~ SUSP
50.0000 [IU] | Freq: Every day | SUBCUTANEOUS | Status: DC
  Administered 2020-11-27: 50 [IU] via SUBCUTANEOUS
  Filled 2020-11-27: qty 10

## 2020-11-27 MED ORDER — PROPOFOL 10 MG/ML IV BOLUS
INTRAVENOUS | Status: DC | PRN
  Administered 2020-11-27: 200 mg via INTRAVENOUS

## 2020-11-27 SURGICAL SUPPLY — 18 items
BAG URO CATCHER STRL LF (MISCELLANEOUS) ×2 IMPLANT
CATH URET 5FR 28IN OPEN ENDED (CATHETERS) ×2 IMPLANT
CLOTH BEACON ORANGE TIMEOUT ST (SAFETY) ×2 IMPLANT
GLOVE SURG ENC MOIS LTX SZ7.5 (GLOVE) ×2 IMPLANT
GLOVE SURG ENC MOIS LTX SZ8 (GLOVE) ×2 IMPLANT
GLOVE SURG ENC TEXT LTX SZ7.5 (GLOVE) ×2 IMPLANT
GLOVE SURG LTX SZ8 (GLOVE) ×2 IMPLANT
GOWN STRL REUS W/ TWL XL LVL3 (GOWN DISPOSABLE) ×1 IMPLANT
GOWN STRL REUS W/TWL XL LVL3 (GOWN DISPOSABLE) ×3 IMPLANT
GUIDEWIRE STR DUAL SENSOR (WIRE) IMPLANT
GUIDEWIRE ZIPWRE .038 STRAIGHT (WIRE) ×2 IMPLANT
KIT TURNOVER KIT A (KITS) ×2 IMPLANT
MANIFOLD NEPTUNE II (INSTRUMENTS) ×2 IMPLANT
PACK CYSTO (CUSTOM PROCEDURE TRAY) ×2 IMPLANT
PAD POSITIONING PINK XL (MISCELLANEOUS) ×2 IMPLANT
STENT URET 6FRX26 CONTOUR (STENTS) ×2 IMPLANT
TRAY FOLEY MTR SLVR 16FR STAT (SET/KITS/TRAYS/PACK) ×2 IMPLANT
TUBING CONNECTING 10 (TUBING) ×2 IMPLANT

## 2020-11-27 NOTE — Consult Note (Signed)
Urology Consult   Physician requesting consult: Left ureteral stones, sepsis  Reason for consult: Antonieta Pert, MD  History of Present Illness: Brandon Schaefer is a 65 y.o. male with multiple medical comorbidities.  He was initially seen in the emergency department on 11/26/2020 after 2 falls from standing.  His initial laboratory work-up found a leukocytosis of 25.5, lactic acidosis of 2.6 as well as AKI with a creatinine of 1.39.  He had a CT stone study that was read at 8:12 AM on 11/27/2020, which revealed two obstructing 5 mm left ureteral calculi along with bilateral renal stones.  Urology was eventually consulted at 3:31 PM to address his left ureteral stone burden.  CT head from this morning was unremarkable.  The patient has a history of kidney stones and required PCNL with Dr. Tresa Moore in 2014.  Currently, the patient denies any significant left-sided flank pain, nausea/vomiting, dysuria or gross hematuria.  He does state that over the past several weeks, his urine has appeared darker than usual, but he has not seen any frank hematuria until this morning in he ER.  His only other significant complaint is general malaise and fatigue.   Past Medical History:  Diagnosis Date   Allergy    Anemia    Anxiety    colon ca dx'd 11/2011   Dental abscess 08/03/2015   Diabetes mellitus    Headache(784.0)    History of blood transfusion    Hyperlipidemia    Hypertension    Neuromuscular disorder ([place])    peripheral neuropathy   Shortness of breath    with exertion    Past Surgical History:  Procedure Laterality Date   COMPLEX WOUND CLOSURE N/A 01/06/2013   Procedure: EXCISION CHRONIC ABDOMINAL WOUND;  Surgeon: Harl Bowie, MD;  Location: [facility];  Service: General;  Laterality: N/A;   CYSTOSCOPY W/ URETERAL STENT PLACEMENT Bilateral 01/04/2013   Procedure: CYSTOSCOPY WITH RETROGRADE PYELOGRAM/Right double J URETERAL STENT PLACEMENT;  Surgeon: Alexis Frock, MD;  Location: [facility];  Service:  Urology;  Laterality: Bilateral;   CYSTOSCOPY WITH RETROGRADE PYELOGRAM, URETEROSCOPY AND STENT PLACEMENT Right 01/06/2013   Procedure: CYSTOSCOPY WITH RIGHT RETROGRADE PYELOGRAM, URETEROSCOPY AND BILATERAL STENT EXCHANGE, BILATERAL STONE BASKET EXTRACTION ;  Surgeon: Alexis Frock, MD;  Location: [facility];  Service: Urology;  Laterality: Right;   FRACTURE SURGERY      ORIF-left radius has pin   HOLMIUM LASER APPLICATION N/A [id number]   Procedure: HOLMIUM LASER APPLICATION;  Surgeon: Alexis Frock, MD;  Location: [facility];  Service: Urology;  Laterality: N/A;   NEPHROLITHOTOMY Left 01/04/2013   Procedure: NEPHROLITHOTOMY PERCUTANEOUS  LEFT 1ST STAGE PERCUTANEOUS NEPHROSTOLITHOTOMY;  Surgeon: Alexis Frock, MD;  Location: [facility];  Service: Urology;  Laterality: Left;   NEPHROLITHOTOMY Left 01/06/2013   Procedure: NEPHROLITHOTOMY PERCUTANEOUS SECOND LOOK;  Surgeon: Alexis Frock, MD;  Location: [facility];  Service: Urology;  Laterality: Left;   PARTIAL COLECTOMY  01/17/2012   Procedure: PARTIAL COLECTOMY;  Surgeon: Harl Bowie, MD;  Location: [facility];  Service: General;;   PILONIDAL CYST EXCISION     PORT-A-CATH REMOVAL Left 01/06/2013   Procedure: REMOVAL PORT-A-CATH;  Surgeon: Harl Bowie, MD;  Location: [facility];  Service: General;  Laterality: Left;   PORTACATH PLACEMENT  03/19/2012   Procedure: INSERTION PORT-A-CATH;  Surgeon: Harl Bowie, MD;  Location: [facility];  Service: General;  Laterality: N/A;   PROCTOSCOPY  01/17/2012   Procedure: PROCTOSCOPY;  Surgeon: Harl Bowie, MD;  Location: [facility];  Service: General;;    Current Hospital Medications:  Home Meds:  Current Meds  Medication Sig   aspirin EC 81 MG tablet Take 81 mg by mouth daily.   carvedilol (COREG) 6.25 MG tablet TAKE 1 TABLET(6.25 MG) BY MOUTH TWICE DAILY WITH A MEAL (Patient taking differently: Take 6.25 mg by mouth 2 (two) times daily with a meal.)   Continuous Blood Gluc Receiver  (FREESTYLE LIBRE 2 READER) DEVI Use as directed to check sugars   E11.65   Continuous Blood Gluc Sensor (FREESTYLE LIBRE 14 DAY SENSOR) MISC Use as directed to check sugars  E11.65   CONTOUR NEXT TEST test strip USE TO CHECK BLOOD SUGAR THREE TIMES DAILY AS DIRECTED   DULoxetine (CYMBALTA) 30 MG capsule TAKE 1 CAPSULE BY MOUTH DAILY (Patient taking differently: Take 30 mg by mouth daily. TAKE 1 CAPSULE BY MOUTH DAILY)   insulin lispro protamine-lispro (HUMALOG MIX 75/25) (75-25) 100 UNIT/ML SUSP injection INJECT 45 UNITS UNDER THE SKIN EVERY MORNING AND 40 UNITS AT NIGHT (Patient taking differently: Inject 40-45 Units into the skin 2 (two) times daily with a meal. INJECT 45 UNITS UNDER THE SKIN EVERY MORNING AND 40 UNITS AT NIGHT)   Insulin Syringe-Needle U-100 (INSULIN SYRINGE 1CC/31GX5/16") 31G X 5/16" 1 ML MISC USE AS DIRECTED TWICE DAILY FOR INSULIN   meloxicam (MOBIC) 15 MG tablet TAKE 1 TABLET(15 MG) BY MOUTH DAILY (Patient taking differently: Take 15 mg by mouth in the morning and at bedtime. TAKE 1 TABLET(15 MG) BY MOUTH DAILY)   metFORMIN (GLUCOPHAGE) 1000 MG tablet TAKE 1 TABLET(1000 MG) BY MOUTH TWICE DAILY WITH A MEAL (Patient taking differently: Take 1,000 mg by mouth 2 (two) times daily.)   pregabalin (LYRICA) 100 MG capsule TAKE 1 CAPSULE(100 MG) BY MOUTH THREE TIMES DAILY (Patient taking differently: Take 100 mg by mouth 3 (three) times daily.)   ramipril (ALTACE) 10 MG capsule TAKE 1 CAPSULE(10 MG) BY MOUTH DAILY BEFORE BREAKFAST (Patient taking differently: Take 10 mg by mouth daily. TAKE 1 CAPSULE(10 MG) BY MOUTH DAILY BEFORE BREAKFAST)   rosuvastatin (CRESTOR) 20 MG tablet TAKE 1 TABLET(20 MG) BY MOUTH DAILY BEFORE BREAKFAST (Patient taking differently: Take 20 mg by mouth daily.)   Semaglutide (RYBELSUS) 7 MG TABS Take 7 mg by mouth daily.   triamcinolone ointment (KENALOG) 0.1 % APPLY TOPICALLY TO LEGS TWICE DAILY FOR 2 WEEKS    Scheduled Meds:  carvedilol  6.25 mg Oral BID WC    DULoxetine  30 mg Oral Daily   insulin aspart  0-15 Units Subcutaneous Q4H   insulin detemir  20 Units Subcutaneous BID   pregabalin  100 mg Oral TID   tamsulosin  0.4 mg Oral QPC supper   Continuous Infusions:  ceFEPime (MAXIPIME) IV Stopped (11/27/20 1609)   lactated ringers 125 mL/hr at 11/27/20 1609   [START ON 11/28/2020] vancomycin     PRN Meds:.acetaminophen **OR** acetaminophen, HYDROcodone-acetaminophen  Allergies:  Allergies  Allergen Reactions   [place] Elastic Foam Strap [Attends Briefs Small] Hives    **Elastic in socks    Adhesive [Tape] Rash    Family History  Problem Relation Age of Onset   Diabetes Father    Cancer Maternal Aunt        colon   Cancer Maternal Grandmother        colon    Social History:  reports that he has never smoked. He has never used smokeless tobacco. He reports that he does not drink alcohol and does not  use drugs.  ROS: A complete review of systems was performed.  All systems are negative except for pertinent findings as noted.  Physical Exam:  Vital signs in last 24 hours: Temp:  [97.4 F (36.3 C)-100.1 F (37.8 C)] 98.9 F (37.2 C) (08/15 1303) Pulse Rate:  [95-119] 103 (08/15 1600) Resp:  [20-33] 24 (08/15 1600) BP: (110-141)/(48-110) 136/60 (08/15 1600) SpO2:  [91 %-100 %] 91 % (08/15 1600) Weight:  [190.5 kg] 190.5 kg (08/14 2106) Constitutional:  Alert and oriented, No acute distress Cardiovascular: Regular rate and rhythm, No JVD Respiratory: Normal respiratory effort, Lungs clear bilaterally GI: Obese.  Abdomen is soft, nontender, nondistended, no abdominal masses GU: No CVA tenderness Lymphatic: No lymphadenopathy Neurologic: Grossly intact, no focal deficits Psychiatric: Normal mood and affect  Laboratory Data:  Recent Labs    11/26/20 2310 11/27/20 0632  WBC 25.5* 20.0*  HGB 13.4 13.1  HCT 39.7 39.3  PLT 218 210    Recent Labs    11/26/20 2310 11/27/20 0632  NA 136 137  K 4.4 4.1  CL 104 107   GLUCOSE 332* 264*  BUN 26* 24*  CALCIUM 8.8* 8.3*  CREATININE 1.39* 1.27*     Results for orders placed or performed during the hospital encounter of 11/26/20 (from the past 24 hour(s))  CBC with Differential     Status: Abnormal   Collection Time: 11/26/20 11:10 PM  Result Value Ref Range   WBC 25.5 (H) 4.0 - 10.5 K/uL   RBC 4.74 4.22 - 5.81 MIL/uL   Hemoglobin 13.4 13.0 - 17.0 g/dL   HCT 39.7 39.0 - 52.0 %   MCV 83.8 80.0 - 100.0 fL   MCH 28.3 26.0 - 34.0 pg   MCHC 33.8 30.0 - 36.0 g/dL   RDW 13.5 11.5 - 15.5 %   Platelets 218 150 - 400 K/uL   nRBC 0.0 0.0 - 0.2 %   Neutrophils Relative % 93 %   Neutro Abs 23.6 (H) 1.7 - 7.7 K/uL   Lymphocytes Relative 2 %   Lymphs Abs 0.5 (L) 0.7 - 4.0 K/uL   Monocytes Relative 3 %   Monocytes Absolute 0.9 0.1 - 1.0 K/uL   Eosinophils Relative 0 %   Eosinophils Absolute 0.0 0.0 - 0.5 K/uL   Basophils Relative 0 %   Basophils Absolute 0.1 0.0 - 0.1 K/uL   WBC Morphology VACUOLATED NEUTROPHILS    Immature Granulocytes 2 %   Abs Immature Granulocytes 0.40 (H) 0.00 - 0.07 K/uL  Basic metabolic panel     Status: Abnormal   Collection Time: 11/26/20 11:10 PM  Result Value Ref Range   Sodium 136 135 - 145 mmol/L   Potassium 4.4 3.5 - 5.1 mmol/L   Chloride 104 98 - 111 mmol/L   CO2 24 22 - 32 mmol/L   Glucose, Bld 332 (H) 70 - 99 mg/dL   BUN 26 (H) 8 - 23 mg/dL   Creatinine, Ser 1.39 (H) 0.61 - 1.24 mg/dL   Calcium 8.8 (L) 8.9 - 10.3 mg/dL   GFR, Estimated 56 (L) >60 mL/min   Anion gap 8 5 - 15  Lactic acid, plasma     Status: Abnormal   Collection Time: 11/26/20 11:10 PM  Result Value Ref Range   Lactic Acid, Venous 2.6 (HH) 0.5 - 1.9 mmol/L  Blood gas, venous (at [facility], not at [facility])     Status: Abnormal   Collection Time: 11/26/20 11:10 PM  Result Value Ref Range  pH, Ven 7.371 7.250 - 7.430   pCO2, Ven 41.0 (L) 44.0 - 60.0 mmHg   pO2, Ven 67.1 (H) 32.0 - 45.0 mmHg   Bicarbonate 23.2 20.0 - 28.0 mmol/L   Acid-base deficit  1.4 0.0 - 2.0 mmol/L   O2 Saturation 91.7 %   Patient temperature 98.6   Urinalysis, Routine w reflex microscopic Urine, Clean Catch     Status: Abnormal   Collection Time: 11/27/20 12:51 AM  Result Value Ref Range   Color, Urine RED (A) YELLOW   APPearance CLOUDY (A) CLEAR   Specific Gravity, Urine 1.018 1.005 - 1.030   pH 6.0 5.0 - 8.0   Glucose, UA >=500 (A) NEGATIVE mg/dL   Hgb urine dipstick MODERATE (A) NEGATIVE   Bilirubin Urine NEGATIVE NEGATIVE   Ketones, ur NEGATIVE NEGATIVE mg/dL   Protein, ur 100 (A) NEGATIVE mg/dL   Nitrite NEGATIVE NEGATIVE   Leukocytes,Ua NEGATIVE NEGATIVE   RBC / HPF >50 (H) 0 - 5 RBC/hpf   Bacteria, UA NONE SEEN NONE SEEN   Mucus PRESENT   Lactic acid, plasma     Status: Abnormal   Collection Time: 11/27/20  1:08 AM  Result Value Ref Range   Lactic Acid, Venous 2.3 (HH) 0.5 - 1.9 mmol/L  Resp Panel by RT-PCR (Flu A&B, Covid) Nasopharyngeal Swab     Status: None   Collection Time: 11/27/20  3:28 AM   Specimen: Nasopharyngeal Swab; Nasopharyngeal(NP) swabs in vial transport medium  Result Value Ref Range   SARS Coronavirus 2 by RT PCR NEGATIVE NEGATIVE   Influenza A by PCR NEGATIVE NEGATIVE   Influenza B by PCR NEGATIVE NEGATIVE  HIV Antibody (routine testing w rflx)     Status: None   Collection Time: 11/27/20  6:32 AM  Result Value Ref Range   HIV Screen 4th Generation wRfx Non Reactive Non Reactive  CBC with Differential     Status: Abnormal   Collection Time: 11/27/20  6:32 AM  Result Value Ref Range   WBC 20.0 (H) 4.0 - 10.5 K/uL   RBC 4.65 4.22 - 5.81 MIL/uL   Hemoglobin 13.1 13.0 - 17.0 g/dL   HCT 39.3 39.0 - 52.0 %   MCV 84.5 80.0 - 100.0 fL   MCH 28.2 26.0 - 34.0 pg   MCHC 33.3 30.0 - 36.0 g/dL   RDW 13.9 11.5 - 15.5 %   Platelets 210 150 - 400 K/uL   nRBC 0.0 0.0 - 0.2 %   Neutrophils Relative % 90 %   Neutro Abs 17.9 (H) 1.7 - 7.7 K/uL   Lymphocytes Relative 4 %   Lymphs Abs 0.8 0.7 - 4.0 K/uL   Monocytes Relative 4 %    Monocytes Absolute 0.8 0.1 - 1.0 K/uL   Eosinophils Relative 1 %   Eosinophils Absolute 0.1 0.0 - 0.5 K/uL   Basophils Relative 0 %   Basophils Absolute 0.1 0.0 - 0.1 K/uL   Immature Granulocytes 1 %   Abs Immature Granulocytes 0.28 (H) 0.00 - 0.07 K/uL  Comprehensive metabolic panel     Status: Abnormal   Collection Time: 11/27/20  6:32 AM  Result Value Ref Range   Sodium 137 135 - 145 mmol/L   Potassium 4.1 3.5 - 5.1 mmol/L   Chloride 107 98 - 111 mmol/L   CO2 21 (L) 22 - 32 mmol/L   Glucose, Bld 264 (H) 70 - 99 mg/dL   BUN 24 (H) 8 - 23 mg/dL   Creatinine, Ser 1.27 (H)  0.61 - 1.24 mg/dL   Calcium 8.3 (L) 8.9 - 10.3 mg/dL   Total Protein 5.9 (L) 6.5 - 8.1 g/dL   Albumin 3.1 (L) 3.5 - 5.0 g/dL   AST 178 (H) 15 - 41 U/L   ALT 52 (H) 0 - 44 U/L   Alkaline Phosphatase 49 38 - 126 U/L   Total Bilirubin 0.6 0.3 - 1.2 mg/dL   GFR, Estimated >60 >60 mL/min   Anion gap 9 5 - 15  Procalcitonin     Status: None   Collection Time: 11/27/20  6:32 AM  Result Value Ref Range   Procalcitonin 14.95 ng/mL  CK     Status: Abnormal   Collection Time: 11/27/20  6:32 AM  Result Value Ref Range   Total CK 10,541 (H) 49 - 397 U/L  Lactic acid, plasma     Status: Abnormal   Collection Time: 11/27/20  6:33 AM  Result Value Ref Range   Lactic Acid, Venous 2.0 (HH) 0.5 - 1.9 mmol/L  CBG monitoring, ED     Status: Abnormal   Collection Time: 11/27/20  8:09 AM  Result Value Ref Range   Glucose-Capillary 227 (H) 70 - 99 mg/dL  CBG monitoring, ED     Status: Abnormal   Collection Time: 11/27/20 12:45 PM  Result Value Ref Range   Glucose-Capillary 244 (H) 70 - 99 mg/dL   Recent Results (from the past 240 hour(s))  Resp Panel by RT-PCR (Flu A&B, Covid) Nasopharyngeal Swab     Status: None   Collection Time: 11/27/20  3:28 AM   Specimen: Nasopharyngeal Swab; Nasopharyngeal(NP) swabs in vial transport medium  Result Value Ref Range Status   SARS Coronavirus 2 by RT PCR NEGATIVE NEGATIVE Final     Comment: (NOTE) SARS-CoV-2 target nucleic acids are NOT DETECTED.  The SARS-CoV-2 RNA is generally detectable in upper respiratory specimens during the acute phase of infection. The lowest concentration of SARS-CoV-2 viral copies this assay can detect is 138 copies/mL. A negative result does not preclude SARS-Cov-2 infection and should not be used as the sole basis for treatment or other patient management decisions. A negative result may occur with  improper specimen collection/handling, submission of specimen other than nasopharyngeal swab, presence of viral mutation(s) within the areas targeted by this assay, and inadequate number of viral copies(<138 copies/mL). A negative result must be combined with clinical observations, patient history, and epidemiological information. The expected result is Negative.  Fact Sheet for Patients:  EntrepreneurPulse.com.au  Fact Sheet for Healthcare Providers:  IncredibleEmployment.be  This test is no t yet approved or cleared by the [place] FDA and  has been authorized for detection and/or diagnosis of SARS-CoV-2 by FDA under an Emergency Use Authorization (EUA). This EUA will remain  in effect (meaning this test can be used) for the duration of the COVID-19 declaration under Section 564(b)(1) of the Act, 21 U.S.C.section 360bbb-3(b)(1), unless the authorization is terminated  or revoked sooner.       Influenza A by PCR NEGATIVE NEGATIVE Final   Influenza B by PCR NEGATIVE NEGATIVE Final    Comment: (NOTE) The Xpert Xpress SARS-CoV-2/FLU/RSV plus assay is intended as an aid in the diagnosis of influenza from Nasopharyngeal swab specimens and should not be used as a sole basis for treatment. Nasal washings and aspirates are unacceptable for Xpert Xpress SARS-CoV-2/FLU/RSV testing.  Fact Sheet for Patients: EntrepreneurPulse.com.au  Fact Sheet for Healthcare  Providers: IncredibleEmployment.be  This test is not yet approved or cleared by  the [employer] and has been authorized for detection and/or diagnosis of SARS-CoV-2 by FDA under an Emergency Use Authorization (EUA). This EUA will remain in effect (meaning this test can be used) for the duration of the COVID-19 declaration under Section 564(b)(1) of the Act, 21 U.S.C. section 360bbb-3(b)(1), unless the authorization is terminated or revoked.  Performed at [facility], [place] [address], [place], [place] [address]     Renal Function: Recent Labs    11/26/20 2310 11/27/20 [id number]  CREATININE 1.39* 1.27*   Estimated Creatinine Clearance: 99.4 mL/min (A) (by C-G formula based on SCr of 1.27 mg/dL (H)).  Radiologic Imaging: DG Knee 1-2 Views Left  Result Date: 11/27/2020 CLINICAL DATA:  Fall x2 days, pain EXAM: LEFT KNEE - 1-2 VIEW COMPARISON:  None. FINDINGS: Limited evaluation due to obliquity/difficulty with patient positioning. No fracture or dislocation is seen. Moderate tricompartmental degenerative changes, most prominent in the medial compartment. Visualized soft tissues are within normal limits. No suprapatellar knee joint effusion. IMPRESSION: No fracture or dislocation is seen. Moderate tricompartmental degenerative changes. Electronically Signed   By: Julian Hy M.D.   On: 11/27/2020 03:49   DG Knee 1-2 Views Right  Result Date: 11/27/2020 CLINICAL DATA:  Fall x2 days, pain EXAM: RIGHT KNEE - 1-2 VIEW COMPARISON:  None. FINDINGS: Lateral view is limited by obliquity. No fracture or dislocation is seen. Moderate tricompartmental degenerative changes, most prominent in the medial compartment. Lateral compartment chondrocalcinosis. No definite suprapatellar knee joint effusion. IMPRESSION: No fracture or dislocation is seen. Moderate tricompartmental degenerative changes. Electronically Signed   By: Julian Hy M.D.   On:  11/27/2020 03:50   CT HEAD WO CONTRAST (5MM)  Result Date: 11/27/2020 CLINICAL DATA:  Fall. EXAM: CT HEAD WITHOUT CONTRAST TECHNIQUE: Contiguous axial images were obtained from the base of the skull through the vertex without intravenous contrast. COMPARISON:  None. FINDINGS: Brain: No evidence of acute infarction, hemorrhage, hydrocephalus, extra-axial collection or mass lesion/mass effect. Mild generalized cerebral atrophy. Vascular: Atherosclerotic vascular calcification of the carotid siphons. No hyperdense vessel. Skull: Normal. Negative for fracture or focal lesion. Sinuses/Orbits: No acute finding. Other: None. IMPRESSION: 1. No acute intracranial abnormality. Electronically Signed   By: Titus Dubin M.D.   On: 11/27/2020 08:26   DG Chest Port 1 View  Result Date: 11/27/2020 CLINICAL DATA:  Fall EXAM: PORTABLE CHEST 1 VIEW COMPARISON:  11/25/2015 FINDINGS: Lungs are clear.  No pleural effusion or pneumothorax. The heart is top-normal in size. IMPRESSION: No evidence of acute cardiopulmonary disease. Electronically Signed   By: Julian Hy M.D.   On: 11/27/2020 03:51   DG Hand Complete Left  Result Date: 11/27/2020 CLINICAL DATA:  Fall x2 days, generalized pain EXAM: LEFT HAND - COMPLETE 3+ VIEW COMPARISON:  None. FINDINGS: No fracture or dislocation is seen. Prior ORIF of the distal radius, incompletely visualized. The joint spaces are preserved. Visualized soft tissues are within normal limits. IMPRESSION: Negative. Electronically Signed   By: Julian Hy M.D.   On: 11/27/2020 03:51   DG Hand Complete Right  Result Date: 11/27/2020 CLINICAL DATA:  Fall x2 days, generalized pain EXAM: RIGHT HAND - COMPLETE 3+ VIEW COMPARISON:  None. FINDINGS: No fracture or dislocation is seen. The joint spaces are preserved. Visualized soft tissues are within normal limits. IMPRESSION: Negative. Electronically Signed   By: Julian Hy M.D.   On: 11/27/2020 03:50   CT RENAL STONE  STUDY  Result Date: 11/27/2020 CLINICAL DATA:  Hematuria, unknown cause  EXAM: CT ABDOMEN AND PELVIS WITHOUT CONTRAST TECHNIQUE: Multidetector CT imaging of the abdomen and pelvis was performed following the standard protocol without IV contrast. COMPARISON:  CT abdomen/pelvis 09/14/2013 FINDINGS: Lower chest: There is dependent subsegmental atelectasis in the lung bases. There is a 7 mm nodule in the right lower lobe an additional 7 mm nodule in the left base. Both of these nodules were present in 2015 but have minimally increased in size. The imaged heart is unremarkable. There is no pleural effusion. Hepatobiliary: The liver is unremarkable. Foci of air within the gallbladder are likely due to gallstones. There is no evidence of acute cholecystitis. There is no biliary ductal dilatation. Pancreas: Normal. Spleen: Normal. Adrenals/Urinary Tract: The adrenals are unremarkable. 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 Multiple additional hypodense lesions in the kidneys likely reflects cysts. There are multiple renal stones bilaterally measuring up to 1.7 cm on the left. There also two calculi in the left ureter measuring up to 5 mm distally. There is fullness of the left collecting system without frank hydronephrosis or hydroureter. An additional stone is noted within the left collecting system measuring 6 mm. No stones are seen along the course of the right ureter. Layering hyperdensity in the bladder may reflect additional passed stones. The bladder is otherwise unremarkable. Stomach/Bowel: Postsurgical changes reflecting partial large bowel resection again seen. There is no evidence of local recurrence or complication at the anastomotic site. The stomach is unremarkable. There is no evidence of bowel obstruction. There is no abnormal bowel wall thickening or inflammatory change. Vascular/Lymphatic: There is minimal calcification in the bilateral common iliac arteries and  thoracic aorta. There is no abdominal aortic aneurysm. A prominent 1.4 cm portacaval lymph node is unchanged since 2015. A 1.9 cm lymph node along the right iliac chain has slightly enlarged since 2015, nonspecific. A 1.2 cm left iliac chain lymph nodes is unchanged. Reproductive: The prostate and seminal vesicles are unremarkable. Other: There is no ascites or free air. Musculoskeletal: There is grade 1 anterolisthesis of L4 on L5, slightly increased since 2015. There is multilevel degenerative change of the lumbar spine with advanced facet arthropathy at L4-L5 and L5-S1. There is no acute osseous abnormality or aggressive osseous lesion. IMPRESSION: 1. Two left ureteral stones measuring up to 5 mm as above and an additional 5 mm stone within the left collecting system; there is mild fullness of the left collecting system without frank hydronephrosis or hydroureter. 2. Multiple additional nonobstructing renal stones bilaterally measuring up to 1.7 cm on the left as above. 3. Cholelithiasis without evidence of acute cholecystitis. 4. 42m pulmonary nodules in both lower lobes described above. These nodules have been present since 2015 with minimal interval increase in size. Given minimal increase over 7 years, these nodules are favored to be benign. Recommend continued attention on follow-up studies. Electronically Signed   By: PValetta MoleM.D.   On: 11/27/2020 08:31    I independently reviewed the above imaging studies.  Impression/Recommendation 65year old male with obstructing left ureteral calculi, bilateral renal stones, sepsis, AKI, possible DKA and gross hematuria.  -The risks, benefits and alternatives of cystoscopy with LEFT JJ stent placement was discussed with the patient.  Risks include, but are not limited to: bleeding, urinary tract infection, ureteral injury, ureteral stricture disease, chronic pain, urinary symptoms, bladder injury, stent migration, the need for nephrostomy tube placement,  MI, CVA, DVT, PE and the inherent risks with general anesthesia.  The patient voices understanding and  wishes to proceed.    Ellison Hughs, MD [facility] 11/27/2020, 5:30 PM

## 2020-11-27 NOTE — Anesthesia Preprocedure Evaluation (Addendum)
Anesthesia Evaluation  Patient identified by MRN, date of birth, ID band Patient awake    Reviewed: Allergy & Precautions, NPO status , Patient's Chart, lab work & pertinent test results  History of Anesthesia Complications Negative for: history of anesthetic complications  Airway Mallampati: III  TM Distance: >3 FB Neck ROM: Full    Dental  (+) Missing, Poor Dentition,    Pulmonary neg pulmonary ROS,    Pulmonary exam normal        Cardiovascular hypertension, Pt. on medications and Pt. on home beta blockers Normal cardiovascular exam     Neuro/Psych  Headaches, Anxiety    GI/Hepatic negative GI ROS, Neg liver ROS,   Endo/Other  diabetes, Type 2, Insulin Dependent, Oral Hypoglycemic AgentsMorbid obesity (BMI 57)  Renal/GU ARFRenal disease (renal stones, sepsis )  negative genitourinary   Musculoskeletal  (+) Arthritis ,   Abdominal   Peds  Hematology negative hematology ROS (+)   Anesthesia Other Findings Day of surgery medications reviewed with patient.  Reproductive/Obstetrics negative OB ROS                            Anesthesia Physical Anesthesia Plan  ASA: 4  Anesthesia Plan: General   Post-op Pain Management:    Induction: Intravenous  PONV Risk Score and Plan: 2 and Treatment may vary due to age or medical condition, Midazolam, Dexamethasone and Ondansetron  Airway Management Planned: Oral ETT and Video Laryngoscope Planned  Additional Equipment: None  Intra-op Plan:   Post-operative Plan: Extubation in OR  Informed Consent: I have reviewed the patients History and Physical, chart, labs and discussed the procedure including the risks, benefits and alternatives for the proposed anesthesia with the patient or authorized representative who has indicated his/her understanding and acceptance.     Dental advisory given  Plan Discussed with: CRNA  Anesthesia Plan  Comments:        Anesthesia Quick Evaluation

## 2020-11-27 NOTE — Progress Notes (Signed)
No available progress bed, will stay PACU til AM per house coverage. Called pharmacy that need scheduled meds  and insulin. Pt is no c/o voice.

## 2020-11-27 NOTE — Progress Notes (Signed)
Pharmacy Antibiotic Note  Brandon Schaefer is a 65 y.o. male admitted on 11/26/2020 with multiple falls secondary to generalized weakness from apparent sepsis.  Pharmacy has been consulted to dose vancomycin and cefepime.  Plan: Vancomycin 2gm IV x 1 then '2500mg'$  q24h (AUC 504.5, Scr 1.39) Cefepime 2gm IV q8h Follow renal function, cultures and clinical course  Height: 6' (182.9 cm) Weight: (!) 190.5 kg (420 lb) IBW/kg (Calculated) : 77.6  Temp (24hrs), Avg:98.8 F (37.1 C), Min:97.4 F (36.3 C), Max:100.1 F (37.8 C)  Recent Labs  Lab 11/26/20 2310 11/27/20 0108  WBC 25.5*  --   CREATININE 1.39*  --   LATICACIDVEN 2.6* 2.3*    Estimated Creatinine Clearance: 90.8 mL/min (A) (by C-G formula based on SCr of 1.39 mg/dL (H)).    Allergies  Allergen Reactions   Adhesive [Tape] Rash    Antimicrobials this admission: 8/14 doxy x 1 8/15 vanc >> 8/15 cefepime >>  Dose adjustments this admission:   Microbiology results: 8/15 BCx:  8/15 UCx:  Thank you for allowing pharmacy to be a part of this patient's care.  Dolly Rias RPh 11/27/2020, 4:39 AM

## 2020-11-27 NOTE — H&P (Addendum)
History and Physical    Brandon Schaefer [id number] DOB: 1955-08-30 DOA: 11/26/2020  PCP: Binnie Rail, MD  Patient coming from: Home.  Chief Complaint: Chills and rigors and weakness.  HPI: Brandon Schaefer is a 65 y.o. male with history of diabetes mellitus type 2, hypertension, hyperlipidemia, morbid obesity with BMI of 56 started experiencing fever and chills since yesterday morning.  Patient felt weak and fell onto the floor when he tried to go to the bathroom.  Patient states when he fell he did not hit his head.  Did not lose consciousness.  He was lying on the floor for 3 hours later his brother came to check on him and found him on the floor he was was called and was brought to the ER.  ED Course: In the ER patient's lab work showed WBC count of 25,000 lactic acid was 2.6 creatinine worsening from 1 about 10 days ago it is around 1.3 now.  Chest x-ray unremarkable COVID test negative.  UA shows RBCs.  In the ER patient had frank hematuria.  Patient's lower extremity has chronic skin changes with erythema.  Patient was started on empiric antibiotic for sepsis source could be cellulitis.  But since patient is also having frank hematuria CT renal study has been ordered which is pending.  Review of Systems: As per HPI, rest all negative.   Past Medical History:  Diagnosis Date   Allergy    Anemia    Anxiety    colon ca dx'd 11/2011   Dental abscess 08/03/2015   Diabetes mellitus    Headache(784.0)    History of blood transfusion    Hyperlipidemia    Hypertension    Neuromuscular disorder ([place])    peripheral neuropathy   Shortness of breath    with exertion    Past Surgical History:  Procedure Laterality Date   COMPLEX WOUND CLOSURE N/A 01/06/2013   Procedure: EXCISION CHRONIC ABDOMINAL WOUND;  Surgeon: Harl Bowie, MD;  Location: [facility];  Service: General;  Laterality: N/A;   CYSTOSCOPY W/ URETERAL STENT PLACEMENT Bilateral 01/04/2013   Procedure: CYSTOSCOPY WITH  RETROGRADE PYELOGRAM/Right double J URETERAL STENT PLACEMENT;  Surgeon: Alexis Frock, MD;  Location: [facility];  Service: Urology;  Laterality: Bilateral;   CYSTOSCOPY WITH RETROGRADE PYELOGRAM, URETEROSCOPY AND STENT PLACEMENT Right 01/06/2013   Procedure: CYSTOSCOPY WITH RIGHT RETROGRADE PYELOGRAM, URETEROSCOPY AND BILATERAL STENT EXCHANGE, BILATERAL STONE BASKET EXTRACTION ;  Surgeon: Alexis Frock, MD;  Location: [facility];  Service: Urology;  Laterality: Right;   FRACTURE SURGERY      ORIF-left radius has pin   HOLMIUM LASER APPLICATION N/A [id number]   Procedure: HOLMIUM LASER APPLICATION;  Surgeon: Alexis Frock, MD;  Location: [facility];  Service: Urology;  Laterality: N/A;   NEPHROLITHOTOMY Left 01/04/2013   Procedure: NEPHROLITHOTOMY PERCUTANEOUS  LEFT 1ST STAGE PERCUTANEOUS NEPHROSTOLITHOTOMY;  Surgeon: Alexis Frock, MD;  Location: [facility];  Service: Urology;  Laterality: Left;   NEPHROLITHOTOMY Left 01/06/2013   Procedure: NEPHROLITHOTOMY PERCUTANEOUS SECOND LOOK;  Surgeon: Alexis Frock, MD;  Location: [facility];  Service: Urology;  Laterality: Left;   PARTIAL COLECTOMY  01/17/2012   Procedure: PARTIAL COLECTOMY;  Surgeon: Harl Bowie, MD;  Location: [facility];  Service: General;;   PILONIDAL CYST EXCISION     PORT-A-CATH REMOVAL Left 01/06/2013   Procedure: REMOVAL PORT-A-CATH;  Surgeon: Harl Bowie, MD;  Location: [facility];  Service: General;  Laterality: Left;   PORTACATH PLACEMENT  03/19/2012   Procedure: INSERTION PORT-A-CATH;  Surgeon:  Harl Bowie, MD;  Location: [facility];  Service: General;  Laterality: N/A;   PROCTOSCOPY  01/17/2012   Procedure: PROCTOSCOPY;  Surgeon: Harl Bowie, MD;  Location: [facility];  Service: General;;     reports that he has never smoked. He has never used smokeless tobacco. He reports that he does not drink alcohol and does not use drugs.  Allergies  Allergen Reactions   [place] Elastic Foam Strap [Attends Briefs Small]  Hives    **Elastic in socks    Adhesive [Tape] Rash    Family History  Problem Relation Age of Onset   Diabetes Father    Cancer Maternal Aunt        colon   Cancer Maternal Grandmother        colon    Prior to Admission medications   Medication Sig Start Date End Date Taking? Authorizing Provider  aspirin EC 81 MG tablet Take 81 mg by mouth daily.   Yes [provider]  carvedilol (COREG) 6.25 MG tablet TAKE 1 TABLET(6.25 MG) BY MOUTH TWICE DAILY WITH A MEAL Patient taking differently: Take 6.25 mg by mouth 2 (two) times daily with a meal. 09/19/20  Yes Burns, Claudina Lick, MD  Continuous Blood Gluc Receiver (FREESTYLE LIBRE 2 READER) DEVI Use as directed to check sugars   E11.65 08/11/20  Yes Burns, Claudina Lick, MD  Continuous Blood Gluc Sensor (FREESTYLE LIBRE 14 DAY SENSOR) MISC Use as directed to check sugars  E11.65 08/11/20  Yes Burns, Claudina Lick, MD  CONTOUR NEXT TEST test strip USE TO CHECK BLOOD SUGAR THREE TIMES DAILY AS DIRECTED 06/23/20  Yes Burns, Claudina Lick, MD  DULoxetine (CYMBALTA) 30 MG capsule TAKE 1 CAPSULE BY MOUTH DAILY Patient taking differently: Take 30 mg by mouth daily. TAKE 1 CAPSULE BY MOUTH DAILY 06/19/20  Yes Burns, Claudina Lick, MD  insulin lispro protamine-lispro (HUMALOG MIX 75/25) (75-25) 100 UNIT/ML SUSP injection INJECT 45 UNITS UNDER THE SKIN EVERY MORNING AND 40 UNITS AT NIGHT Patient taking differently: Inject 40-45 Units into the skin 2 (two) times daily with a meal. INJECT 45 UNITS UNDER THE SKIN EVERY MORNING AND 40 UNITS AT NIGHT 08/11/20  Yes Burns, Claudina Lick, MD  Insulin Syringe-Needle U-100 (INSULIN SYRINGE 1CC/31GX5/16") 31G X 5/16" 1 ML MISC USE AS DIRECTED TWICE DAILY FOR INSULIN 11/16/20  Yes Burns, Claudina Lick, MD  meloxicam (MOBIC) 15 MG tablet TAKE 1 TABLET(15 MG) BY MOUTH DAILY Patient taking differently: Take 15 mg by mouth in the morning and at bedtime. TAKE 1 TABLET(15 MG) BY MOUTH DAILY 09/19/20  Yes Burns, Claudina Lick, MD  metFORMIN (GLUCOPHAGE) 1000 MG tablet  TAKE 1 TABLET(1000 MG) BY MOUTH TWICE DAILY WITH A MEAL Patient taking differently: Take 1,000 mg by mouth 2 (two) times daily. 06/19/20  Yes Burns, Claudina Lick, MD  pregabalin (LYRICA) 100 MG capsule TAKE 1 CAPSULE(100 MG) BY MOUTH THREE TIMES DAILY Patient taking differently: Take 100 mg by mouth 3 (three) times daily. 11/09/20  Yes Burns, Claudina Lick, MD  ramipril (ALTACE) 10 MG capsule TAKE 1 CAPSULE(10 MG) BY MOUTH DAILY BEFORE BREAKFAST Patient taking differently: Take 10 mg by mouth daily. TAKE 1 CAPSULE(10 MG) BY MOUTH DAILY BEFORE BREAKFAST 05/08/20  Yes Burns, Claudina Lick, MD  rosuvastatin (CRESTOR) 20 MG tablet TAKE 1 TABLET(20 MG) BY MOUTH DAILY BEFORE BREAKFAST Patient taking differently: Take 20 mg by mouth daily. 09/19/20  Yes Burns, Claudina Lick, MD  Semaglutide (RYBELSUS) 7 MG TABS Take 7  mg by mouth daily. 08/16/20  Yes Burns, Claudina Lick, MD  triamcinolone ointment (KENALOG) 0.1 % APPLY TOPICALLY TO LEGS TWICE DAILY FOR 2 WEEKS 06/23/20  Yes [provider]    Physical Exam: Constitutional: Moderately built and nourished. Vitals:   11/27/20 0345 11/27/20 0400 11/27/20 0500 11/27/20 0600  BP: (!) 118/48 (!) 127/52 (!) 110/53 (!) 115/51  Pulse: (!) 102 (!) 103 (!) 102 (!) 103  Resp: (!) 30 (!) 31 (!) 32 (!) 28  Temp:      TempSrc:      SpO2: 96% 96% 97% 97%  Weight:      Height:       Eyes: Anicteric no pallor. ENMT: No discharge from the ears eyes nose and mouth. Neck: No mass felt.  No neck rigidity. Respiratory: No rhonchi or crepitations. Cardiovascular: S1-S2 heard. Abdomen: Soft nontender bowel sound present. Musculoskeletal: Has chronic skin changes in the both lower extremity more on the right side on the anterior shin of the right leg. Skin: Erythema on anterior shin which patient states has been chronic. Neurologic: Alert awake oriented to time place and person.  Moving all extremities. Psychiatric: Appears normal.  Normal affect.   Labs on Admission: I have personally  reviewed following labs and imaging studies  CBC: Recent Labs  Lab 11/26/20 2310  WBC 25.5*  NEUTROABS 23.6*  HGB 13.4  HCT 39.7  MCV 83.8  PLT 99991111   Basic Metabolic Panel: Recent Labs  Lab 11/26/20 2310  NA 136  K 4.4  CL 104  CO2 24  GLUCOSE 332*  BUN 26*  CREATININE 1.39*  CALCIUM 8.8*   GFR: Estimated Creatinine Clearance: 90.8 mL/min (A) (by C-G formula based on SCr of 1.39 mg/dL (H)). Liver Function Tests: No results for input(s): AST, ALT, ALKPHOS, BILITOT, PROT, ALBUMIN in the last 168 hours. No results for input(s): LIPASE, AMYLASE in the last 168 hours. No results for input(s): AMMONIA in the last 168 hours. Coagulation Profile: No results for input(s): INR, PROTIME in the last 168 hours. Cardiac Enzymes: No results for input(s): CKTOTAL, CKMB, CKMBINDEX, TROPONINI in the last 168 hours. BNP (last 3 results) No results for input(s): PROBNP in the last 8760 hours. HbA1C: No results for input(s): HGBA1C in the last 72 hours. CBG: No results for input(s): GLUCAP in the last 168 hours. Lipid Profile: No results for input(s): CHOL, HDL, LDLCALC, TRIG, CHOLHDL, LDLDIRECT in the last 72 hours. Thyroid Function Tests: No results for input(s): TSH, T4TOTAL, FREET4, T3FREE, THYROIDAB in the last 72 hours. Anemia Panel: No results for input(s): VITAMINB12, FOLATE, FERRITIN, TIBC, IRON, RETICCTPCT in the last 72 hours. Urine analysis:    Component Value Date/Time   COLORURINE RED (A) 11/27/2020 0051   APPEARANCEUR CLOUDY (A) 11/27/2020 0051   LABSPEC 1.018 11/27/2020 0051   PHURINE 6.0 11/27/2020 0051   GLUCOSEU >=500 (A) 11/27/2020 0051   GLUCOSEU 250 (A) 05/12/2020 0831   HGBUR MODERATE (A) 11/27/2020 0051   BILIRUBINUR NEGATIVE 11/27/2020 0051   KETONESUR NEGATIVE 11/27/2020 0051   PROTEINUR 100 (A) 11/27/2020 0051   UROBILINOGEN 0.2 05/12/2020 0831   NITRITE NEGATIVE 11/27/2020 0051   LEUKOCYTESUR NEGATIVE 11/27/2020 0051   Sepsis  Labs: '@LABRCNTIP'$ (procalcitonin:4,lacticidven:4) ) Recent Results (from the past 240 hour(s))  Resp Panel by RT-PCR (Flu A&B, Covid) Nasopharyngeal Swab     Status: None   Collection Time: 11/27/20  3:28 AM   Specimen: Nasopharyngeal Swab; Nasopharyngeal(NP) swabs in vial transport medium  Result Value Ref Range Status  SARS Coronavirus 2 by RT PCR NEGATIVE NEGATIVE Final    Comment: (NOTE) SARS-CoV-2 target nucleic acids are NOT DETECTED.  The SARS-CoV-2 RNA is generally detectable in upper respiratory specimens during the acute phase of infection. The lowest concentration of SARS-CoV-2 viral copies this assay can detect is 138 copies/mL. A negative result does not preclude SARS-Cov-2 infection and should not be used as the sole basis for treatment or other patient management decisions. A negative result may occur with  improper specimen collection/handling, submission of specimen other than nasopharyngeal swab, presence of viral mutation(s) within the areas targeted by this assay, and inadequate number of viral copies(<138 copies/mL). A negative result must be combined with clinical observations, patient history, and epidemiological information. The expected result is Negative.  Fact Sheet for Patients:  EntrepreneurPulse.com.au  Fact Sheet for Healthcare Providers:  IncredibleEmployment.be  This test is no t yet approved or cleared by the [place] FDA and  has been authorized for detection and/or diagnosis of SARS-CoV-2 by FDA under an Emergency Use Authorization (EUA). This EUA will remain  in effect (meaning this test can be used) for the duration of the COVID-19 declaration under Section 564(b)(1) of the Act, 21 U.S.C.section 360bbb-3(b)(1), unless the authorization is terminated  or revoked sooner.       Influenza A by PCR NEGATIVE NEGATIVE Final   Influenza B by PCR NEGATIVE NEGATIVE Final    Comment: (NOTE) The Xpert Xpress  SARS-CoV-2/FLU/RSV plus assay is intended as an aid in the diagnosis of influenza from Nasopharyngeal swab specimens and should not be used as a sole basis for treatment. Nasal washings and aspirates are unacceptable for Xpert Xpress SARS-CoV-2/FLU/RSV testing.  Fact Sheet for Patients: EntrepreneurPulse.com.au  Fact Sheet for Healthcare Providers: IncredibleEmployment.be  This test is not yet approved or cleared by the [place] FDA and has been authorized for detection and/or diagnosis of SARS-CoV-2 by FDA under an Emergency Use Authorization (EUA). This EUA will remain in effect (meaning this test can be used) for the duration of the COVID-19 declaration under Section 564(b)(1) of the Act, 21 U.S.C. section 360bbb-3(b)(1), unless the authorization is terminated or revoked.  Performed at [facility], [place] [address], [place], [place] [address]      Radiological Exams on Admission: DG Knee 1-2 Views Left  Result Date: 11/27/2020 CLINICAL DATA:  Fall x2 days, pain EXAM: LEFT KNEE - 1-2 VIEW COMPARISON:  None. FINDINGS: Limited evaluation due to obliquity/difficulty with patient positioning. No fracture or dislocation is seen. Moderate tricompartmental degenerative changes, most prominent in the medial compartment. Visualized soft tissues are within normal limits. No suprapatellar knee joint effusion. IMPRESSION: No fracture or dislocation is seen. Moderate tricompartmental degenerative changes. Electronically Signed   By: Julian Hy M.D.   On: 11/27/2020 03:49   DG Knee 1-2 Views Right  Result Date: 11/27/2020 CLINICAL DATA:  Fall x2 days, pain EXAM: RIGHT KNEE - 1-2 VIEW COMPARISON:  None. FINDINGS: Lateral view is limited by obliquity. No fracture or dislocation is seen. Moderate tricompartmental degenerative changes, most prominent in the medial compartment. Lateral compartment chondrocalcinosis. No definite  suprapatellar knee joint effusion. IMPRESSION: No fracture or dislocation is seen. Moderate tricompartmental degenerative changes. Electronically Signed   By: Julian Hy M.D.   On: 11/27/2020 03:50   DG Chest Port 1 View  Result Date: 11/27/2020 CLINICAL DATA:  Fall EXAM: PORTABLE CHEST 1 VIEW COMPARISON:  11/25/2015 FINDINGS: Lungs are clear.  No pleural effusion or pneumothorax. The heart is top-normal  in size. IMPRESSION: No evidence of acute cardiopulmonary disease. Electronically Signed   By: Julian Hy M.D.   On: 11/27/2020 03:51   DG Hand Complete Left  Result Date: 11/27/2020 CLINICAL DATA:  Fall x2 days, generalized pain EXAM: LEFT HAND - COMPLETE 3+ VIEW COMPARISON:  None. FINDINGS: No fracture or dislocation is seen. Prior ORIF of the distal radius, incompletely visualized. The joint spaces are preserved. Visualized soft tissues are within normal limits. IMPRESSION: Negative. Electronically Signed   By: Julian Hy M.D.   On: 11/27/2020 03:51   DG Hand Complete Right  Result Date: 11/27/2020 CLINICAL DATA:  Fall x2 days, generalized pain EXAM: RIGHT HAND - COMPLETE 3+ VIEW COMPARISON:  None. FINDINGS: No fracture or dislocation is seen. The joint spaces are preserved. Visualized soft tissues are within normal limits. IMPRESSION: Negative. Electronically Signed   By: Julian Hy M.D.   On: 11/27/2020 03:50     Assessment/Plan Principal Problem:   Sepsis ([place]) Active Problems:   DM (diabetes mellitus) ([place])   HTN (hypertension)   B12 deficiency    SIRS with possible developing sepsis source not clear could be from cellulitis of the right lower extremity but patient states swelling and redness has been chronic.  It is warm to touch.  Follow blood cultures continue hydration and of follow lactic acid and procalcitonin. Gross hematuria -patient has gross hematuria in the ER.  Patient states that is new.  We will check CT renal study follow CBC.  Hold aspirin  or any anticoagulation.  May need urology input if does not improve.  Check CK levels. Diabetes mellitus type 2 on NovoLog 70/25.  Takes 50 units in the morning and 40 units in the evening.  Hold metformin while inpatient. Hypertension we will continue Coreg but hold ACE inhibitor due to acute renal failure.  As needed IV hydralazine. Acute renal failure with creatinine worsening from 1 it is around 1.3.  Likely from dehydration and sepsis.  Holding ACE inhibitor's.  Hydration.  Follow metabolic panel. Generalized weakness could be from sepsis.  Closely monitor.  Patient did have a fall x-rays do not show anything acute.  CT of the head is pending.  Since patient has sepsis physiology on presentation with elevated lactic acid WBC count and fever and possible source could be cellulitis of the lower extremity patient will need close monitoring for any further worsening inpatient status.   DVT prophylaxis: SCDs.  Avoiding anticoagulation because patient is having gross hematuria. Code Status: Full code. Family Communication: Discussed with patient. Disposition Plan: Home when stable. Consults called: None. Admission status: Inpatient.   Rise Patience MD [facility] Pager [phone].  If 7PM-7AM, please contact night-coverage [email or web address] Password [facility]  11/27/2020, 6:14 AM

## 2020-11-27 NOTE — ED Provider Notes (Signed)
Signed out to me at shift change by Dr. Almyra Free.  Patient originally came in secondary to multiple falls.  Falls are secondary to generalized weakness from apparent sepsis.  Patient tachypneic, tachycardic at arrival with low-grade fever.  Examination reveals evidence of cellulitis of the right lower extremity.  Son reports that he has had infections in this area previously.  Prior provider has initiated IV doxycycline after obtaining cultures.  Patient found to have significant leukocytosis.  Lactic acid mildly elevated at 2.6.  Chest x-ray without evidence of infection.  Urinalysis without evidence of infection.  Patient with mild injuries to the hands and knees, x-rays are negative.  Patient to be admitted for further management of right lower extremity cellulitis.   Orpah Greek, MD 11/27/20 [phone]

## 2020-11-27 NOTE — Anesthesia Postprocedure Evaluation (Signed)
Anesthesia Post Note  Patient: Brandon Schaefer  Procedure(s) Performed: CYSTOSCOPY WITH RETROGRADE PYELOGRAM/URETERAL STENT PLACEMENT (Left: Ureter)     Patient location during evaluation: PACU Anesthesia Type: General Level of consciousness: awake and alert and oriented Pain management: pain level controlled Vital Signs Assessment: post-procedure vital signs reviewed and stable Respiratory status: spontaneous breathing, nonlabored ventilation and respiratory function stable Cardiovascular status: blood pressure returned to baseline Postop Assessment: no apparent nausea or vomiting Anesthetic complications: no   No notable events documented.  Last Vitals:  Vitals:   11/27/20 2030 11/27/20 2045  BP: (!) 151/78 (!) 152/81  Pulse: (!) 115 (!) 111  Resp: 20 19  Temp:    SpO2: 98% 92%    Last Pain:  Vitals:   11/27/20 2045  TempSrc:   PainSc: [place]

## 2020-11-27 NOTE — ED Notes (Signed)
Report given to short stay - staff states they are on the way to get the pt.

## 2020-11-27 NOTE — Progress Notes (Addendum)
Patient seen and examined personally, I reviewed the chart, history and physical and admission note, done by admitting physician this morning and agree with the same with following addendum.  Please refer to the morning admission note for more detailed plan of care.  Briefly,  65 year old male with T2DM, HTN, HLD, morbid obesity BMI 56 came to the ED with fever, chills, weakness  11/26/20 and fell on the floor of the bathroom, no LOC or head injury and was lying on the floor for 3 hours when his brother found him. In the ED work-up showed significant leukocytosis, lactic acidosis, AKI chest x-ray COVID-19 negative UA showed RBC but with frank hematuria and chronic skin significant erythema, CT stone studies pending Patient is admitted for cellulitis  Chart reviewed,RR 20S-low 30S, BP 120s-130s WBC:25K > 20K, lactate 2.6> 2.3, creat 1 (7/29) > 1.39 > 1.2 improving. UA rbc > 50, nitrite, LE negative, culture sent.  On exam Patient reports he lives alone, his brother is around the block.  He woke up yesterday morning was shaking and felt in the bathroom and laid there for 3 years until his brother came.  He reports he needs some rest.  He reports he was placed oxygen by EMS. He is on 2 [place].  SIRS:Tachycardia and tachypnea , lactic acidosis, concern for developing sepsis source not clear : admitted with suspicion for RLE cellulitis, UA  nitrite and LE negative, but wbc at 50-Procalcitonin is high, repeat lactate down to 2.  Blood culture pending.  Continue vancomycin, cefepime.  CT stone pending this am  Recent Labs  Lab 11/26/20 2310 11/27/20 0108 11/27/20 0632 11/27/20 0633  WBC 25.5*  --  20.0*  --   LATICACIDVEN 2.6* 2.3*  --  2.0*  PROCALCITON  --   --  14.95  --     Rhabdomyolysis CK 10k suspect from laying on the floor/fall, traumatic.Continue on IV fluid hydration renal function improving, monitor CK level.  We will discontinue Crestor for now.  Hematuria on UA, CT stone reviewed:shows 2  left ureteral stones up to 5 mm and 5 mm 1 in the collecting system, mild fullness of the left collecting system without frank hydronephrosis or hydroureter, nonobstructing renal stones bilaterally.  I paged urology who will come and see him. Addendum: discussed w/ Dr Lovena Neighbours- may need stent, npo.  Cholelithiasis without acute cholecystitis.  Asymptomatic.  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  Monitor labs Recent Labs  Lab 11/26/20 2310 11/27/20 0632  BUN 26* 24*  CREATININE 1.39* 1.27*    T2DM on mix NovoLog : change to lantus 20 u BID for now as NPO- cont sliding scale insulin. Holed home 70/30 insulin. Monitor blood glucose.  Last HbA1c poorly controlled 8.8 on 7/29 Recent Labs  Lab 11/27/20 0809 11/27/20 1245  GLUCAP 227* 244*    Hypertension: Blood pressure controlled.  Continue Coreg.Lisinopril on hold due to AKI.  Monitor blood pressure.  Generalized weakness/debility/fall, lives alone: Obtain PT OT eval. likely need a skilled nursing facility.

## 2020-11-27 NOTE — Anesthesia Procedure Notes (Signed)
Procedure Name: Intubation Date/Time: 11/27/2020 7:17 PM Performed by: Cleda Daub, CRNA Pre-anesthesia Checklist: Patient identified, Emergency Drugs available, Suction available and Patient being monitored Patient Re-evaluated:Patient Re-evaluated prior to induction Oxygen Delivery Method: Circle system utilized Preoxygenation: Pre-oxygenation with 100% oxygen Induction Type: IV induction and Rapid sequence Laryngoscope Size: Glidescope and 4 Grade View: Grade I Tube type: Oral Tube size: 7.5 mm Number of attempts: 1 Airway Equipment and Method: Stylet and Oral airway Placement Confirmation: ETT inserted through vocal cords under direct vision, positive ETCO2 and breath sounds checked- equal and bilateral Secured at: 22 cm Tube secured with: Tape Dental Injury: Teeth and Oropharynx as per pre-operative assessment

## 2020-11-27 NOTE — Op Note (Signed)
Operative Note  Preoperative diagnosis:  1.  Obstructing left ureteral calculi 2.  Bilateral renal stones  Postoperative diagnosis: 1.  Obstructing left ureteral calculi 2.  Bilateral renal stones 3.  Multiple 5 mm bladder stones  Procedure(s): 1.  Cystoscopy with left ureteral stent placement 2.  Left retrograde pyelogram with intraoperative interpretation of fluoroscopic imaging  Surgeon: Ellison Hughs, MD  Assistants:  None  Anesthesia:  General  Complications:  None  EBL: Less than 5 mL  Specimens: 1.  Bladder stones  Drains/Catheters: 1.  Left 6 French, 26 cm JJ stent without tether 2.  16 French Foley catheter with 10 mL of sterile water in the balloon  Intraoperative findings:   Left retrograde pyelogram revealed dilation of the mid and proximal aspects of the left ureter along with dilation of the left renal pelvis and its associated calyces.  No other filling defects were seen within the remainder of the left upper urinary tract. Multiple bladder stones measuring approximately 5 mm each  Indication:  Brandon Schaefer is a 65 y.o. male with multiple medical comorbidities who is currently being treated for sepsis likely secondary to UTI from obstructing left ureteral calculi.  He has been consented for the above procedures, voiced understanding and wishes to proceed.  Description of procedure:  After informed consent was obtained, the patient was brought to the operating room and general endotracheal anesthesia was administered. The patient was then placed in the dorsolithotomy position and prepped and draped in the usual sterile fashion. A timeout was performed. A 23 French rigid cystoscope was then inserted into the urethral meatus and advanced into the bladder under direct vision. A complete bladder survey revealed a 5 mm bladder stones that were evacuated through the sheath of the cystoscope.  No other intravesical abnormalities were identified.  A 5 French  ureteral catheter was then inserted into the left ureteral orifice and a retrograde pyelogram was obtained, with the findings listed above.  A Glidewire was then used to intubate the lumen of the ureteral catheter and was advanced up to the left renal pelvis, under fluoroscopic guidance.  The catheter was then removed, leaving the wire in place.  A 6 French, 26 cm JJ stent was then advanced over the wire and into good position within the left collecting system, confirming placement via fluoroscopy.  The rigid cystoscope was removed.  A 16 French Foley catheter was then inserted with return of clear irrigant.  The catheter balloon was inflated with 10 mL of sterile water and placed to gravity drainage.  The patient tolerated the procedure well and was transferred to the postanesthesia in stable condition.  Plan: Keep Foley catheter in place for 24 hours.  I will arrange outpatient follow-up with Dr. Tresa Moore for definitive stone treatment.

## 2020-11-27 NOTE — Transfer of Care (Signed)
Immediate Anesthesia Transfer of Care Note  Patient: Brandon Schaefer  Procedure(s) Performed: CYSTOSCOPY WITH RETROGRADE PYELOGRAM/URETERAL STENT PLACEMENT (Left: Ureter)  Patient Location: PACU  Anesthesia Type:General  Level of Consciousness: awake, alert , oriented and patient cooperative  Airway & Oxygen Therapy: Patient Spontanous Breathing and Patient connected to face mask oxygen  Post-op Assessment: Report given to RN and Post -op Vital signs reviewed and stable  Post vital signs: Reviewed and stable  Last Vitals:  Vitals Value Taken Time  BP    Temp    Pulse    Resp    SpO2      Last Pain:  Vitals:   11/27/20 1811  TempSrc:   PainSc: 0-No pain      Patients Stated Pain Goal: 5 ([id number])  Complications: No notable events documented.

## 2020-11-28 ENCOUNTER — Encounter (HOSPITAL_COMMUNITY): Payer: Self-pay | Admitting: Urology

## 2020-11-28 LAB — GLUCOSE, CAPILLARY
Glucose-Capillary: 176 mg/dL — ABNORMAL HIGH (ref 70–99)
Glucose-Capillary: 192 mg/dL — ABNORMAL HIGH (ref 70–99)
Glucose-Capillary: 206 mg/dL — ABNORMAL HIGH (ref 70–99)
Glucose-Capillary: 209 mg/dL — ABNORMAL HIGH (ref 70–99)
Glucose-Capillary: 212 mg/dL — ABNORMAL HIGH (ref 70–99)
Glucose-Capillary: 225 mg/dL — ABNORMAL HIGH (ref 70–99)
Glucose-Capillary: 230 mg/dL — ABNORMAL HIGH (ref 70–99)

## 2020-11-28 LAB — CK: Total CK: 6109 U/L — ABNORMAL HIGH (ref 49–397)

## 2020-11-28 LAB — BASIC METABOLIC PANEL
Anion gap: 7 (ref 5–15)
BUN: 20 mg/dL (ref 8–23)
CO2: 24 mmol/L (ref 22–32)
Calcium: 8.4 mg/dL — ABNORMAL LOW (ref 8.9–10.3)
Chloride: 105 mmol/L (ref 98–111)
Creatinine, Ser: 1.01 mg/dL (ref 0.61–1.24)
GFR, Estimated: >60 mL/min (ref >60)
Glucose, Bld: 222 mg/dL — ABNORMAL HIGH (ref 70–99)
Potassium: 4.3 mmol/L (ref 3.5–5.1)
Sodium: 136 mmol/L (ref 135–145)

## 2020-11-28 LAB — CBC
HCT: 38.4 % — ABNORMAL LOW (ref 39.0–52.0)
Hemoglobin: 12.6 g/dL — ABNORMAL LOW (ref 13.0–17.0)
MCH: 28.4 pg (ref 26.0–34.0)
MCHC: 32.8 g/dL (ref 30.0–36.0)
MCV: 86.5 fL (ref 80.0–100.0)
Platelets: 179 10*3/uL (ref 150–400)
RBC: 4.44 MIL/uL (ref 4.22–5.81)
RDW: 14 % (ref 11.5–15.5)
WBC: 15.6 10*3/uL — ABNORMAL HIGH (ref 4.0–10.5)
nRBC: 0 % (ref 0.0–0.2)

## 2020-11-28 LAB — URINE CULTURE: Culture: <10000 — AB

## 2020-11-28 LAB — LACTIC ACID, PLASMA: Lactic Acid, Venous: 0.8 mmol/L (ref 0.5–1.9)

## 2020-11-28 MED ORDER — INSULIN ASPART 100 UNIT/ML IJ SOLN
INTRAMUSCULAR | Status: AC
  Administered 2020-11-29: 5 [IU] via SUBCUTANEOUS
  Filled 2020-11-28: qty 1

## 2020-11-28 MED ORDER — ALUM & MAG HYDROXIDE-SIMETH 200-200-20 MG/5ML PO SUSP
30.0000 mL | Freq: Four times a day (QID) | ORAL | Status: DC | PRN
  Administered 2020-11-28 – 2020-11-29 (×2): 30 mL via ORAL
  Filled 2020-11-28 (×3): qty 30

## 2020-11-28 MED ORDER — HYDROCODONE-ACETAMINOPHEN 5-325 MG PO TABS
ORAL_TABLET | ORAL | Status: AC
  Filled 2020-11-28: qty 1

## 2020-11-28 MED ORDER — LACTATED RINGERS IV SOLN: INTRAVENOUS | Status: DC

## 2020-11-28 MED ORDER — CHLORHEXIDINE GLUCONATE CLOTH 2 % EX PADS
6.0000 | MEDICATED_PAD | Freq: Every day | CUTANEOUS | Status: DC
  Administered 2020-11-28 – 2020-12-05 (×6): 6 via TOPICAL

## 2020-11-28 MED ORDER — CARVEDILOL 6.25 MG PO TABS
6.2500 mg | ORAL_TABLET | Freq: Two times a day (BID) | ORAL | Status: DC
  Administered 2020-11-28 – 2020-12-06 (×15): 6.25 mg via ORAL
  Filled 2020-11-28 (×16): qty 1

## 2020-11-28 MED ORDER — PANTOPRAZOLE SODIUM 40 MG PO TBEC
40.0000 mg | DELAYED_RELEASE_TABLET | Freq: Every day | ORAL | Status: DC
  Administered 2020-11-28 – 2020-12-06 (×9): 40 mg via ORAL
  Filled 2020-11-28 (×9): qty 1

## 2020-11-28 MED ORDER — HEPARIN SODIUM (PORCINE) 5000 UNIT/ML IJ SOLN
5000.0000 [IU] | Freq: Three times a day (TID) | INTRAMUSCULAR | Status: DC
  Administered 2020-11-28 – 2020-12-06 (×23): 5000 [IU] via SUBCUTANEOUS
  Filled 2020-11-28 (×23): qty 1

## 2020-11-28 MED ORDER — SIMETHICONE 80 MG PO CHEW
80.0000 mg | CHEWABLE_TABLET | Freq: Once | ORAL | Status: AC
  Administered 2020-11-28: 80 mg via ORAL
  Filled 2020-11-28: qty 1

## 2020-11-28 MED ORDER — VANCOMYCIN HCL 1750 MG/350ML IV SOLN: 1750.0000 mg | INTRAVENOUS | Status: DC

## 2020-11-28 MED ORDER — INSULIN ASPART 100 UNIT/ML IJ SOLN
INTRAMUSCULAR | Status: AC
  Administered 2020-11-28: 3 [IU] via SUBCUTANEOUS
  Filled 2020-11-28: qty 1

## 2020-11-28 NOTE — Progress Notes (Signed)
Pharmacy Antibiotic Note  Brandon Schaefer is a 65 y.o. male admitted on 11/26/2020 with multiple falls secondary to generalized weakness from apparent sepsis.  Pharmacy has been consulted to dose vancomycin and cefepime.  Plan: Adjust Vancomycin to '1750mg'$  q24h (AUC 526, Scr 1.01) Continue Cefepime 2gm IV q8h Follow renal function, cultures and clinical course  Height: 6' (182.9 cm) Weight: (!) 190.5 kg (420 lb) IBW/kg (Calculated) : 77.6  Temp (24hrs), Avg:98.6 F (37 C), Min:97.5 F (36.4 C), Max:98.9 F (37.2 C)  Recent Labs  Lab 11/26/20 2310 11/27/20 0108 11/27/20 0632 11/27/20 0633 11/28/20 0211  WBC 25.5*  --  20.0*  --  15.6*  CREATININE 1.39*  --  1.27*  --  1.01  LATICACIDVEN 2.6* 2.3*  --  2.0*  --      Estimated Creatinine Clearance: 125 mL/min (by C-G formula based on SCr of 1.01 mg/dL).    Allergies  Allergen Reactions   [place] Elastic Foam Strap [Attends Briefs Small] Hives    **Elastic in socks    Adhesive [Tape] Rash    Antimicrobials this admission: 8/14 doxy x 1 8/15 vanc >> 8/15 cefepime >>  Dose adjustments this admission: 8/16 adjust vancomycin from '2500mg'$  to '1750mg'$  q24h  Microbiology results: 8/15 BCx: sent 8/15 UCx: <10k insignificant growth  Thank you for allowing pharmacy to be a part of this patient's care.  Peggyann Juba, PharmD, BCPS Pharmacy: [phone] 11/28/2020, 8:51 AM

## 2020-11-28 NOTE — Progress Notes (Signed)
PROGRESS NOTE    Brandon Schaefer  [id number] DOB: 11/27/1955 DOA: 11/26/2020 PCP: Binnie Rail, MD   Chief Complaint  Patient presents with   Fall   Brief Narrative:  65 year old male with T2DM, HTN, HLD, morbid obesity BMI 56 came to the ED with fever, chills, weakness  11/26/20 and fell on the floor of the bathroom, no LOC or head injury and was lying on the floor for 3 hours when his brother found him.  He was then brought to the ED  In the ED work-up showed significant leukocytosis, lactic acidosis, AKI chest x-ray COVID-19 negative UA showed RBC but with frank hematuria and chronic skin  and  erythema-cellulitis suspected.  Patient  had hematuria  too, felt to have SIRS/developing sepsis unclear source  but cellulitis suspected, he had hematuria and CT renal stone was done that showed left ureteric stones-felt urinary is a likely source at this time and urology was consulted underwent left ureteric stent placement 8/15 evening.  Subjective: Seen this morning.  Complains of aches and pain from fall.  But no chills or flank pain. Blood pressure remains a stable WBC count downtrending.  Overnight afebrile  Assessment & Plan:  SIRS Sepsis w/ tachycardia and tachypnea , lactic acidosis POA: likely urinary source.Initially source was not clear suspected to have rt leg cellulitis but reported that he has chronic erythema on leg but he had hematuria and CT renal stone was done that showed left ureteric stones-felt urinary is a likely source at this time and urology was consulted underwent left ureteric stent placement 8/15.  Overall clinically improving continue on current antibiotics follow-up bladder urine culture hopefully can discontinue vancomycin soon repeat lactic acid is normal. Cont ivf. Recent Labs  Lab 11/26/20 2310 11/27/20 0108 11/27/20 [id number] 11/27/20 0633 11/28/20 0211 11/28/20 0953  WBC 25.5*  --  20.0*  --  15.6*  --   LATICACIDVEN 2.6* 2.3*  --  2.0*  --  0.8   PROCALCITON  --   --  14.95  --   --   --     Rhabdomyolysis CK 10k suspect from laying on the floor/fall, traumatic: Improving on IV fluid hydration check daily.  Continue to hold Crestor.  Continue IV fluids  Hematuria Bilaterally renal stone/left ureteric stones CT stone reviewed:shows 2 left ureteral stones up to 5 mm and 5 mm 1 in the collecting system, mild fullness of the left collecting system without frank hydronephrosis or hydroureter, nonobstructing renal stones bilaterally.  Seen by Dr. Lovena Neighbours and status post left ureteral stent placement. Foley in place.   Cholelithiasis without acute cholecystitis.  Asymptomatic.  7 mm pulmonary nodules in both lower lobes present since 2015 and deemed to be benign as there is stable  Acute kidney injury, multifactorial prerenal, could be from rhabdomyolysis.  Resolved on ivf. Recent Labs  Lab 11/26/20 2310 11/27/20 0632 11/28/20 0211  BUN 26* 24* 20  CREATININE 1.39* 1.27* 1.01    T2DM on mix NovoLog at home : her was placed on Lantus 20 units twice daily SSI q4hr. continue to adjust Lantus and sliding scale here:Last HbA1c poorly controlled 8.8 on 7/29 Recent Labs  Lab 11/27/20 2014 11/27/20 2212 11/28/20 0003 11/28/20 0414 11/28/20 0902  GLUCAP 228* 219* 225* 206* 176*    Hypertension: stable.  Resume Coreg as he has been having tachycardia.  Continue to hold lisinopril for now.    Generalized weakness/debility/fall at home, lives alone: Obtain PT OT .  He had x-rays of  left and right hand and left and right knees CT head and chest x-ray in the ED- showing DJD.  Morbid obesity with BMI 56.  Would benefit with continued follow-up weight loss.  Diet Order             Diet heart healthy/carb modified Room service appropriate? Yes; Fluid consistency: Thin  Diet effective now                   Patient's Body mass index is 56.96 kg/m.   DVT prophylaxis: SCDs Start: 11/27/20 0612. LOVENOX once [facility] w/ Urology. Code  Status:   Code Status: Full Code  Family Communication: plan of care discussed with patient at bedside. Status is: Inpatient  Remains inpatient appropriate because:IV treatments appropriate due to intensity of illness or inability to take PO and Inpatient level of care appropriate due to severity of illness Dispo: The patient is from: Home              Anticipated d/c is to: Home              Patient currently is not medically stable to d/c.   Difficult to place patient No Unresulted Labs (From admission, onward)     Start     Ordered   11/28/20 0500  CK  Daily,   R     Question:  Specimen collection method  Answer:  Lab=Lab collect   11/27/20 0955   11/28/20 0500  CBC  Daily,   R     Question:  Specimen collection method  Answer:  Lab=Lab collect   11/27/20 0955   11/28/20 [id number]  Basic metabolic panel  Daily,   R     Question:  Specimen collection method  Answer:  Lab=Lab collect   11/27/20 0955   11/26/20 2144  Blood culture (routine x 2)  BLOOD CULTURE X 2,   STAT      11/26/20 2144            Medications reviewed:  Scheduled Meds:  Chlorhexidine Gluconate Cloth  6 each Topical Daily   DULoxetine  30 mg Oral Daily   HYDROcodone-acetaminophen       HYDROcodone-acetaminophen       insulin aspart       insulin aspart       insulin aspart  0-15 Units Subcutaneous Q4H   insulin detemir  20 Units Subcutaneous BID   pregabalin  100 mg Oral TID   tamsulosin  0.4 mg Oral QPC supper   Continuous Infusions:  ceFEPime (MAXIPIME) IV Stopped (11/27/20 2248)   lactated ringers 125 mL/hr at 11/28/20 0923   [START ON 11/29/2020] vancomycin     Consultants:see note  Procedures:see note Antimicrobials: Anti-infectives (From admission, onward)    Start     Dose/Rate Route Frequency Ordered Stop   11/29/20 1000  vancomycin (VANCOREADY) IVPB 1750 mg/350 mL        1,750 mg 175 mL/hr over 120 Minutes Intravenous Every 24 hours 11/28/20 0848     11/28/20 0400  vancomycin (VANCOCIN)  2,500 mg in sodium chloride 0.9 % 500 mL IVPB  Status:  Discontinued        2,500 mg 250 mL/hr over 120 Minutes Intravenous Every 24 hours 11/27/20 0441 11/28/20 0848   11/27/20 0445  vancomycin (VANCOREADY) IVPB 2000 mg/400 mL        2,000 mg 200 mL/hr over 120 Minutes Intravenous  Once 11/27/20 0432 11/27/20 0806   11/27/20 0445  ceFEPIme (MAXIPIME)  2 g in sodium chloride 0.9 % 100 mL IVPB        2 g 200 mL/hr over 30 Minutes Intravenous Every 8 hours 11/27/20 0432     11/26/20 2200  doxycycline (VIBRAMYCIN) 100 mg in sodium chloride 0.9 % 250 mL IVPB        100 mg 125 mL/hr over 120 Minutes Intravenous  Once 11/26/20 2149 11/27/20 0129      Culture/Microbiology    Component Value Date/Time   SDES  11/27/2020 0051    URINE, CLEAN CATCH Performed at [facility], [place] [address] [place], [place] [address]    SPECREQUEST  11/27/2020 0051    NONE Performed at [facility], [place] [address] [place], [place] [address]    CULT (A) 11/27/2020 0051    <10,000 COLONIES/mL INSIGNIFICANT GROWTH Performed at [place] [address], [place], [place] [address]    REPTSTATUS 11/28/2020 FINAL 11/27/2020 0051    Other culture-see note  Objective: Vitals: Today's Vitals   11/28/20 0702 11/28/20 0809 11/28/20 0900 11/28/20 0922  BP: 133/64 117/64    Pulse: (!) 119 100    Resp: (!) 21 20    Temp:  (!) 97.5 F (36.4 C)    TempSrc:  Oral    SpO2: 92% 95%    Weight:      Height:      PainSc: Asleep  2  8     Intake/Output Summary (Last 24 hours) at 11/28/2020 1133 Last data filed at 11/28/2020 0923 Gross per 24 hour  Intake 2820 ml  Output 1637 ml  Net 1183 ml   Filed Weights   11/26/20 2106  Weight: (!) 190.5 kg   Weight change:   Intake/Output from previous day: 08/15 0701 - 08/16 0700 In: 3100 [P.O.:350; I.V.:1750; IV Piggyback:1000] Out: [id number] [Urine:1435; Blood:2] Intake/Output this shift: Total I/O In: 120  [P.O.:120] Out: 200 [Urine:200] Filed Weights   11/26/20 2106  Weight: (!) 190.5 kg   Examination: General exam: AAOx3, obese,older than stated age, weak appearing. HEENT:Oral mucosa moist, Ear/Nose WNL grossly,dentition normal. Respiratory system: bilaterally diminished,no use of accessory muscle, non tender. Cardiovascular system: S1 & S2 +,No JVD. Gastrointestinal system: Abdomen soft, NT,ND, BS+. Nervous System:Alert, awake, moving extremities Extremities: no edema, distal peripheral pulses palpable.  Skin: No rashes,no icterus. MSK: Normal muscle bulk,tone, power  Data Reviewed: I have personally reviewed following labs and imaging studies CBC: Recent Labs  Lab 11/26/20 2310 11/27/20 0632 11/28/20 0211  WBC 25.5* 20.0* 15.6*  NEUTROABS 23.6* 17.9*  --   HGB 13.4 13.1 12.6*  HCT 39.7 39.3 38.4*  MCV 83.8 84.5 86.5  PLT 218 210 0000000   Basic Metabolic Panel: Recent Labs  Lab 11/26/20 2310 11/27/20 0632 11/28/20 0211  NA 136 137 136  K 4.4 4.1 4.3  CL 104 107 105  CO2 24 21* 24  GLUCOSE 332* 264* 222*  BUN 26* 24* 20  CREATININE 1.39* 1.27* 1.01  CALCIUM 8.8* 8.3* 8.4*   GFR: Estimated Creatinine Clearance: 125 mL/min (by C-G formula based on SCr of 1.01 mg/dL). Liver Function Tests: Recent Labs  Lab 11/27/20 0632  AST 178*  ALT 52*  ALKPHOS 49  BILITOT 0.6  PROT 5.9*  ALBUMIN 3.1*   No results for input(s): LIPASE, AMYLASE in the last 168 hours. No results for input(s): AMMONIA in the last 168 hours. Coagulation Profile: No results for input(s): INR, PROTIME in the last 168 hours. Cardiac Enzymes: Recent  Labs  Lab 11/27/20 0632 11/28/20 0211  CKTOTAL 10,541* 6,109*   BNP (last 3 results) No results for input(s): PROBNP in the last 8760 hours. HbA1C: No results for input(s): HGBA1C in the last 72 hours. CBG: Recent Labs  Lab 11/27/20 2014 11/27/20 2212 11/28/20 0003 11/28/20 0414 11/28/20 0902  GLUCAP 228* 219* 225* 206* 176*   Lipid  Profile: No results for input(s): CHOL, HDL, LDLCALC, TRIG, CHOLHDL, LDLDIRECT in the last 72 hours. Thyroid Function Tests: No results for input(s): TSH, T4TOTAL, FREET4, T3FREE, THYROIDAB in the last 72 hours. Anemia Panel: No results for input(s): VITAMINB12, FOLATE, FERRITIN, TIBC, IRON, RETICCTPCT in the last 72 hours. Sepsis Labs: Recent Labs  Lab 11/26/20 2310 11/27/20 0108 11/27/20 [id number] 11/27/20 0633 11/28/20 0953  PROCALCITON  --   --  14.95  --   --   LATICACIDVEN 2.6* 2.3*  --  2.0* 0.8    Recent Results (from the past 240 hour(s))  Urine Culture     Status: Abnormal   Collection Time: 11/27/20 12:51 AM   Specimen: Urine, Clean Catch  Result Value Ref Range Status   Specimen Description   Final    URINE, CLEAN CATCH Performed at [facility], [place] [address] [place], [place] [address]    Special Requests   Final    NONE Performed at [facility], [place] [address] [place], [place] [address]    Culture (A)  Final    <10,000 COLONIES/mL INSIGNIFICANT GROWTH Performed at [place] [address], [place], [place] [address]    Report Status 11/28/2020 FINAL  Final  Resp Panel by RT-PCR (Flu A&B, Covid) Nasopharyngeal Swab     Status: None   Collection Time: 11/27/20  3:28 AM   Specimen: Nasopharyngeal Swab; Nasopharyngeal(NP) swabs in vial transport medium  Result Value Ref Range Status   SARS Coronavirus 2 by RT PCR NEGATIVE NEGATIVE Final    Comment: (NOTE) SARS-CoV-2 target nucleic acids are NOT DETECTED.  The SARS-CoV-2 RNA is generally detectable in upper respiratory specimens during the acute phase of infection. The lowest concentration of SARS-CoV-2 viral copies this assay can detect is 138 copies/mL. A negative result does not preclude SARS-Cov-2 infection and should not be used as the sole basis for treatment or other patient management decisions. A negative result may occur with  improper specimen  collection/handling, submission of specimen other than nasopharyngeal swab, presence of viral mutation(s) within the areas targeted by this assay, and inadequate number of viral copies(<138 copies/mL). A negative result must be combined with clinical observations, patient history, and epidemiological information. The expected result is Negative.  Fact Sheet for Patients:  EntrepreneurPulse.com.au  Fact Sheet for Healthcare Providers:  IncredibleEmployment.be  This test is no t yet approved or cleared by the [place] FDA and  has been authorized for detection and/or diagnosis of SARS-CoV-2 by FDA under an Emergency Use Authorization (EUA). This EUA will remain  in effect (meaning this test can be used) for the duration of the COVID-19 declaration under Section 564(b)(1) of the Act, 21 U.S.C.section 360bbb-3(b)(1), unless the authorization is terminated  or revoked sooner.       Influenza A by PCR NEGATIVE NEGATIVE Final   Influenza B by PCR NEGATIVE NEGATIVE Final    Comment: (NOTE) The Xpert Xpress SARS-CoV-2/FLU/RSV plus assay is intended as an aid in the diagnosis of influenza from Nasopharyngeal swab specimens and should not be used as a sole basis for treatment. Nasal washings  and aspirates are unacceptable for Xpert Xpress SARS-CoV-2/FLU/RSV testing.  Fact Sheet for Patients: EntrepreneurPulse.com.au  Fact Sheet for Healthcare Providers: IncredibleEmployment.be  This test is not yet approved or cleared by the [place] FDA and has been authorized for detection and/or diagnosis of SARS-CoV-2 by FDA under an Emergency Use Authorization (EUA). This EUA will remain in effect (meaning this test can be used) for the duration of the COVID-19 declaration under Section 564(b)(1) of the Act, 21 U.S.C. section 360bbb-3(b)(1), unless the authorization is terminated or revoked.  Performed at [facility], [place] [address] [place], [place] [address]      Radiology Studies: DG Knee 1-2 Views Left  Result Date: 11/27/2020 CLINICAL DATA:  Fall x2 days, pain EXAM: LEFT KNEE - 1-2 VIEW COMPARISON:  None. FINDINGS: Limited evaluation due to obliquity/difficulty with patient positioning. No fracture or dislocation is seen. Moderate tricompartmental degenerative changes, most prominent in the medial compartment. Visualized soft tissues are within normal limits. No suprapatellar knee joint effusion. IMPRESSION: No fracture or dislocation is seen. Moderate tricompartmental degenerative changes. Electronically Signed   By: Julian Hy M.D.   On: 11/27/2020 03:49   DG Knee 1-2 Views Right  Result Date: 11/27/2020 CLINICAL DATA:  Fall x2 days, pain EXAM: RIGHT KNEE - 1-2 VIEW COMPARISON:  None. FINDINGS: Lateral view is limited by obliquity. No fracture or dislocation is seen. Moderate tricompartmental degenerative changes, most prominent in the medial compartment. Lateral compartment chondrocalcinosis. No definite suprapatellar knee joint effusion. IMPRESSION: No fracture or dislocation is seen. Moderate tricompartmental degenerative changes. Electronically Signed   By: Julian Hy M.D.   On: 11/27/2020 03:50   CT HEAD WO CONTRAST (5MM)  Result Date: 11/27/2020 CLINICAL DATA:  Fall. EXAM: CT HEAD WITHOUT CONTRAST TECHNIQUE: Contiguous axial images were obtained from the base of the skull through the vertex without intravenous contrast. COMPARISON:  None. FINDINGS: Brain: No evidence of acute infarction, hemorrhage, hydrocephalus, extra-axial collection or mass lesion/mass effect. Mild generalized cerebral atrophy. Vascular: Atherosclerotic vascular calcification of the carotid siphons. No hyperdense vessel. Skull: Normal. Negative for fracture or focal lesion. Sinuses/Orbits: No acute finding. Other: None. IMPRESSION: 1. No acute intracranial abnormality. Electronically Signed    By: Titus Dubin M.D.   On: 11/27/2020 08:26   DG Chest Port 1 View  Result Date: 11/27/2020 CLINICAL DATA:  Fall EXAM: PORTABLE CHEST 1 VIEW COMPARISON:  11/25/2015 FINDINGS: Lungs are clear.  No pleural effusion or pneumothorax. The heart is top-normal in size. IMPRESSION: No evidence of acute cardiopulmonary disease. Electronically Signed   By: Julian Hy M.D.   On: 11/27/2020 03:51   DG Hand Complete Left  Result Date: 11/27/2020 CLINICAL DATA:  Fall x2 days, generalized pain EXAM: LEFT HAND - COMPLETE 3+ VIEW COMPARISON:  None. FINDINGS: No fracture or dislocation is seen. Prior ORIF of the distal radius, incompletely visualized. The joint spaces are preserved. Visualized soft tissues are within normal limits. IMPRESSION: Negative. Electronically Signed   By: Julian Hy M.D.   On: 11/27/2020 03:51   DG Hand Complete Right  Result Date: 11/27/2020 CLINICAL DATA:  Fall x2 days, generalized pain EXAM: RIGHT HAND - COMPLETE 3+ VIEW COMPARISON:  None. FINDINGS: No fracture or dislocation is seen. The joint spaces are preserved. Visualized soft tissues are within normal limits. IMPRESSION: Negative. Electronically Signed   By: Julian Hy M.D.   On: 11/27/2020 03:50   DG C-Arm 1-60 Min-No Report  Result Date: 11/27/2020 Fluoroscopy was utilized by the requesting  physician.  No radiographic interpretation.   CT RENAL STONE STUDY  Result Date: 11/27/2020 CLINICAL DATA:  Hematuria, unknown cause EXAM: CT ABDOMEN AND PELVIS WITHOUT CONTRAST TECHNIQUE: Multidetector CT imaging of the abdomen and pelvis was performed following the standard protocol without IV contrast. COMPARISON:  CT abdomen/pelvis 09/14/2013 FINDINGS: Lower chest: There is dependent subsegmental atelectasis in the lung bases. There is a 7 mm nodule in the right lower lobe an additional 7 mm nodule in the left base. Both of these nodules were present in 2015 but have minimally increased in size. The imaged heart  is unremarkable. There is no pleural effusion. Hepatobiliary: The liver is unremarkable. Foci of air within the gallbladder are likely due to gallstones. There is no evidence of acute cholecystitis. There is no biliary ductal dilatation. Pancreas: Normal. Spleen: Normal. Adrenals/Urinary Tract: The adrenals are unremarkable. 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 Multiple additional hypodense lesions in the kidneys likely reflects cysts. There are multiple renal stones bilaterally measuring up to 1.7 cm on the left. There also two calculi in the left ureter measuring up to 5 mm distally. There is fullness of the left collecting system without frank hydronephrosis or hydroureter. An additional stone is noted within the left collecting system measuring 6 mm. No stones are seen along the course of the right ureter. Layering hyperdensity in the bladder may reflect additional passed stones. The bladder is otherwise unremarkable. Stomach/Bowel: Postsurgical changes reflecting partial large bowel resection again seen. There is no evidence of local recurrence or complication at the anastomotic site. The stomach is unremarkable. There is no evidence of bowel obstruction. There is no abnormal bowel wall thickening or inflammatory change. Vascular/Lymphatic: There is minimal calcification in the bilateral common iliac arteries and thoracic aorta. There is no abdominal aortic aneurysm. A prominent 1.4 cm portacaval lymph node is unchanged since 2015. A 1.9 cm lymph node along the right iliac chain has slightly enlarged since 2015, nonspecific. A 1.2 cm left iliac chain lymph nodes is unchanged. Reproductive: The prostate and seminal vesicles are unremarkable. Other: There is no ascites or free air. Musculoskeletal: There is grade 1 anterolisthesis of L4 on L5, slightly increased since 2015. There is multilevel degenerative change of the lumbar spine with advanced facet arthropathy at L4-L5  and L5-S1. There is no acute osseous abnormality or aggressive osseous lesion. IMPRESSION: 1. Two left ureteral stones measuring up to 5 mm as above and an additional 5 mm stone within the left collecting system; there is mild fullness of the left collecting system without frank hydronephrosis or hydroureter. 2. Multiple additional nonobstructing renal stones bilaterally measuring up to 1.7 cm on the left as above. 3. Cholelithiasis without evidence of acute cholecystitis. 4. 54m pulmonary nodules in both lower lobes described above. These nodules have been present since 2015 with minimal interval increase in size. Given minimal increase over 7 years, these nodules are favored to be benign. Recommend continued attention on follow-up studies. Electronically Signed   By: PValetta MoleM.D.   On: 11/27/2020 08:31     LOS: 1 day   RAntonieta Pert MD [facility]  11/28/2020, 11:33 AM

## 2020-11-29 LAB — CBC
HCT: 36.9 % — ABNORMAL LOW (ref 39.0–52.0)
Hemoglobin: 12.3 g/dL — ABNORMAL LOW (ref 13.0–17.0)
MCH: 28.3 pg (ref 26.0–34.0)
MCHC: 33.3 g/dL (ref 30.0–36.0)
MCV: 85 fL (ref 80.0–100.0)
Platelets: 208 10*3/uL (ref 150–400)
RBC: 4.34 MIL/uL (ref 4.22–5.81)
RDW: 13.4 % (ref 11.5–15.5)
WBC: 9.7 10*3/uL (ref 4.0–10.5)
nRBC: 0 % (ref 0.0–0.2)

## 2020-11-29 LAB — BASIC METABOLIC PANEL
Anion gap: 8 (ref 5–15)
BUN: 17 mg/dL (ref 8–23)
CO2: 26 mmol/L (ref 22–32)
Calcium: 8.7 mg/dL — ABNORMAL LOW (ref 8.9–10.3)
Chloride: 105 mmol/L (ref 98–111)
Creatinine, Ser: 0.87 mg/dL (ref 0.61–1.24)
GFR, Estimated: >60 mL/min (ref >60)
Glucose, Bld: 221 mg/dL — ABNORMAL HIGH (ref 70–99)
Potassium: 4.1 mmol/L (ref 3.5–5.1)
Sodium: 139 mmol/L (ref 135–145)

## 2020-11-29 LAB — GLUCOSE, CAPILLARY
Glucose-Capillary: 181 mg/dL — ABNORMAL HIGH (ref 70–99)
Glucose-Capillary: 197 mg/dL — ABNORMAL HIGH (ref 70–99)
Glucose-Capillary: 198 mg/dL — ABNORMAL HIGH (ref 70–99)
Glucose-Capillary: 210 mg/dL — ABNORMAL HIGH (ref 70–99)
Glucose-Capillary: 217 mg/dL — ABNORMAL HIGH (ref 70–99)
Glucose-Capillary: 229 mg/dL — ABNORMAL HIGH (ref 70–99)

## 2020-11-29 LAB — CK: Total CK: 3646 U/L — ABNORMAL HIGH (ref 49–397)

## 2020-11-29 MED ORDER — SIMETHICONE 80 MG PO CHEW
80.0000 mg | CHEWABLE_TABLET | Freq: Once | ORAL | Status: AC
  Administered 2020-11-29: 80 mg via ORAL
  Filled 2020-11-29: qty 1

## 2020-11-29 MED ORDER — AMOXICILLIN-POT CLAVULANATE 875-125 MG PO TABS
1.0000 | ORAL_TABLET | Freq: Two times a day (BID) | ORAL | Status: DC
  Administered 2020-11-29 – 2020-12-06 (×14): 1 via ORAL
  Filled 2020-11-29 (×14): qty 1

## 2020-11-29 MED ORDER — HYDROCODONE-ACETAMINOPHEN 5-325 MG PO TABS
1.0000 | ORAL_TABLET | ORAL | Status: DC | PRN
  Administered 2020-11-29 – 2020-12-06 (×29): 1 via ORAL
  Filled 2020-11-29 (×29): qty 1

## 2020-11-29 MED ORDER — SENNOSIDES-DOCUSATE SODIUM 8.6-50 MG PO TABS
1.0000 | ORAL_TABLET | Freq: Two times a day (BID) | ORAL | Status: DC
  Administered 2020-11-29 – 2020-12-04 (×6): 1 via ORAL
  Filled 2020-11-29 (×14): qty 1

## 2020-11-29 NOTE — Progress Notes (Signed)
2 Days Post-Op   Subjective/Chief Complaint:  1 - Recurrent Large Volume Urolithiasis  2014 - Bilateral multistage PCNL 2022 - Left aprpox 2cm stone, Left ureteral 12m x2, Right approx 1.2 cm stone by CT == > Left JJ stent placed  2 - Obstructing Pyelonephritis - fever, leukocystosis, acidosis at presentation this admission and CT with left obstructing stone, now stented. ULenna Gilford8/14 pending / no growth to date. Placed on empiric cefipime. No recent prior CX's from hospital or our office to further guide therapy.  PMH sig for morbid obesity (still independatn in all ADL's), IDDM2, NO ischemic CV disease / blood thinners. retired form [employer] His PCP is SBilley GoslingMD.   Today "CJayleon is stable. No high grade fevers post 24 hrs. Prior CX's non-clonal, improving on cefipime. He is not at functional baseline and feels pretty weak.     Objective: Vital signs in last 24 hours: Temp:  [97.8 F (36.6 C)-98.3 F (36.8 C)] 98.1 F (36.7 C) (08/17 1307) Pulse Rate:  [79-95] 79 (08/17 1307) Resp:  [16-18] 18 (08/17 1307) BP: (151-161)/(58-74) 156/58 (08/17 1307) SpO2:  [92 %-94 %] 94 % (08/17 1307) Last BM Date: 11/26/20  Intake/Output from previous day: 08/16 0701 - 08/17 0700 In: 3166.4 [P.O.:720; I.V.:2070.7; IV Piggyback:375.7] Out: 1775 [Urine:1775] Intake/Output this shift: Total I/O In: -  Out: 241[Urine:285]  EXAM: NAD, AOx3, Somewhat ill appearing in bed,  Non-labored breathing RRR Stable morbid truncal obesity limits sensitivity of exam. Stable buried penis Stable LE edema that is chronic to knee.   Lab Results:  Recent Labs    11/28/20 0211 11/29/20 0455  WBC 15.6* 9.7  HGB 12.6* 12.3*  HCT 38.4* 36.9*  PLT 179 208   BMET Recent Labs    11/28/20 0211 11/29/20 0455  NA 136 139  K 4.3 4.1  CL 105 105  CO2 24 26  GLUCOSE 222* 221*  BUN 20 17  CREATININE 1.01 0.87  CALCIUM 8.4* 8.7*   PT/INR No results for input(s): LABPROT, INR  in the last 72 hours. ABG Recent Labs    11/26/20 2310  HCO3 23.2    Studies/Results: DG C-Arm 1-60 Min-No Report  Result Date: 11/27/2020 Fluoroscopy was utilized by the requesting physician.  No radiographic interpretation.    Anti-infectives: Anti-infectives (From admission, onward)    Start     Dose/Rate Route Frequency Ordered Stop   11/29/20 1000  vancomycin (VANCOREADY) IVPB 1750 mg/350 mL  Status:  Discontinued        1,750 mg 175 mL/hr over 120 Minutes Intravenous Every 24 hours 11/28/20 0848 11/28/20 1343   11/28/20 0400  vancomycin (VANCOCIN) 2,500 mg in sodium chloride 0.9 % 500 mL IVPB  Status:  Discontinued        2,500 mg 250 mL/hr over 120 Minutes Intravenous Every 24 hours 11/27/20 0441 11/28/20 0848   11/27/20 0445  vancomycin (VANCOREADY) IVPB 2000 mg/400 mL        2,000 mg 200 mL/hr over 120 Minutes Intravenous  Once 11/27/20 0432 11/27/20 0806   11/27/20 0445  ceFEPIme (MAXIPIME) 2 g in sodium chloride 0.9 % 100 mL IVPB        2 g 200 mL/hr over 30 Minutes Intravenous Every 8 hours 11/27/20 0432     11/26/20 2200  doxycycline (VIBRAMYCIN) 100 mg in sodium chloride 0.9 % 250 mL IVPB        100 mg 125 mL/hr over 120 Minutes Intravenous  Once 11/26/20 2149  11/27/20 0129       Assessment/Plan:  1 - Recurrent Large Volume Urolithiasis - now temporized with stenting. Discussed options and we agree on path to stone free with bilateral ureteroscopy in elective setting after back to baseline (estimate abtou 1 month). Discused may require staged approach, but fortunately does not require major surgery (percutanous approach). Will proceed with scheduled in elective setting and arranging GU follow up. NO further stone directed care this admission.   2 - Obstructing Pyelonephritis - improving by objetive parameters on empiric ABX. Rec transition to similar broad spectrum PO regimen (augmentin or similar) for 10-14 days total course if no more specific info avail from  CX's this admission.  OK to DC foley at anytime when not needed for accurate UOP monitoring.   Discussed goals of hospitaliztaion, he may even require SNF or similar temporarily as he is not nearly back at baseline which is independent living alone.   Will follow PRN at this point, please call me directly with questions anytime.    Alexis Frock 11/29/2020

## 2020-11-29 NOTE — Hospital Course (Signed)
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

## 2020-11-29 NOTE — Progress Notes (Signed)
    OVERNIGHT PROGRESS REPORT  Notified by RN that patient states that plain Simethicone has worked for him where as the Maalox combination has not. Ordered single dose      Gershon Cull MSNA MSN ACNPC-AG Acute Care Nurse Practitioner [place]

## 2020-11-29 NOTE — Progress Notes (Signed)
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 Prior right lower extremity cellulitis 2017, prior right medial malleolus fracture   admit 8/15 chills rigors all-did not hit head down for 3 hours WBC 25 creatinine 1.3-found to have frank hematuria Lower extremities chronic skin changes admitted for sepsis secondary to unclear source Found to have rhabdo CK 10,000 CT scan showed ureteric stone to left side  Underwent cystoscopy with left 6 French 26 cm JJ stent without tether an indwelling 80 French Foley placed  Hospital-Problem based course  Sepsis on admission urinary cause De-escalate to Augmentin until 8/26--14 days treatment-White count improved SIRS physiology improved Urolithiasis status post cystoscopy 11/27/2020 Outpatient further management per urology Hematuria seems to be resolving DC Foley catheter today Pain control Norco every 4 as needed AKI on admission 2/2 rhabdo, ACE prior to admission, sepsis Resolving well Rhabdo secondary to being found down Supportive management, cut back fluids to 50 cc/H Lower extremity bilateral swelling, prior right malleolar fracture Wounds to be examined in a.m. Superobesity BMI 56 DM TY 2 Continue Levemir 20, sliding scale-sugars seem relatively well controlled mild nausea not really hungry only ate pineapple  Continue Lyrica 100 3 times daily   DVT prophylaxis: Heparin Code Status: Full Family Communication: None Disposition:  Status is: Inpatient  Remains inpatient appropriate because:Hemodynamically unstable, Altered mental status, and Ongoing diagnostic testing needed not appropriate for outpatient work up  Dispo:  The patient is from: Home              Anticipated d/c is to: Home              Patient currently is not medically stable to d/c.   Difficult to place patient No       Consultants:  Urology  Procedures: As above  Antimicrobials: Now on Augmentin   Subjective: Slight nausea earlier today no vomit-no chest pain no fever Ate only pineapple Independent prior to baseline and ambulatory   Objective: Vitals:   11/28/20 1842 11/28/20 2019 11/29/20 0419 11/29/20 1307  BP: (!) 151/66 (!) 156/70 (!) 161/74 (!) 156/58  Pulse: 95 86 93 79  Resp: '18 18 16 18  '$ Temp: 98.3 F (36.8 C) 97.8 F (36.6 C) 98.1 F (36.7 C) 98.1 F (36.7 C)  TempSrc: Oral Oral Oral Oral  SpO2: 94% 92% 92% 94%  Weight:      Height:        Intake/Output Summary (Last 24 hours) at 11/29/2020 1416 Last data filed at 11/29/2020 1145 Gross per 24 hour  Intake 2806.41 ml  Output 1860 ml  Net 946.41 ml   Filed Weights   11/26/20 2106  Weight: (!) 190.5 kg    Examination:  Awake coherent no distress CTA B no rales rhonchi Thick neck Mallampati 4 S1-S2 no murmur Midline upper epigastric surgical scar with paraMedian likely hernia  Data Reviewed: personally reviewed   CBC    Component Value Date/Time   WBC 9.7 11/29/2020 0455   RBC 4.34 11/29/2020 0455   HGB 12.3 (L) 11/29/2020 0455   HGB 13.9 10/09/2017 0757   HGB 13.8 01/09/2017 0843   HGB 13.3 09/14/2013 1027   HCT 36.9 (L) 11/29/2020 0455   HCT 40.2 01/09/2017 0843   HCT 40.3 09/14/2013 1027  PLT 208 11/29/2020 0455   PLT 249 10/09/2017 0757   PLT 267 01/09/2017 0843   PLT 255 09/14/2013 1027   MCV 85.0 11/29/2020 0455   MCV 85 01/09/2017 0843   MCV 80.5 09/14/2013 1027   MCH 28.3 11/29/2020 0455   MCHC 33.3 11/29/2020 0455   RDW 13.4 11/29/2020 0455   RDW 13.9 01/09/2017 0843   RDW 14.8 (H) 09/14/2013 1027   LYMPHSABS 0.8 11/27/2020 0632   LYMPHSABS 2.0 01/09/2017 0843   LYMPHSABS 1.5 09/14/2013 1027   MONOABS 0.8 11/27/2020  0632   MONOABS 0.5 09/14/2013 1027   EOSABS 0.1 11/27/2020 0632   EOSABS 0.3 01/09/2017 0843   BASOSABS 0.1 11/27/2020 0632   BASOSABS 0.0 01/09/2017 0843   BASOSABS 0.1 09/14/2013 1027   CMP Latest Ref Rng & Units 11/29/2020 11/28/2020 11/27/2020  Glucose 70 - 99 mg/dL 221(H) 222(H) 264(H)  BUN 8 - 23 mg/dL 17 20 24(H)  Creatinine 0.61 - 1.24 mg/dL 0.87 1.01 1.27(H)  Sodium 135 - 145 mmol/L 139 136 137  Potassium 3.5 - 5.1 mmol/L 4.1 4.3 4.1  Chloride 98 - 111 mmol/L 105 105 107  CO2 22 - 32 mmol/L 26 24 21(L)  Calcium 8.9 - 10.3 mg/dL 8.7(L) 8.4(L) 8.3(L)  Total Protein 6.5 - 8.1 g/dL - - 5.9(L)  Total Bilirubin 0.3 - 1.2 mg/dL - - 0.6  Alkaline Phos 38 - 126 U/L - - 49  AST 15 - 41 U/L - - 178(H)  ALT 0 - 44 U/L - - 52(H)     Radiology Studies: DG C-Arm 1-60 Min-No Report  Result Date: 11/27/2020 Fluoroscopy was utilized by the requesting physician.  No radiographic interpretation.     Scheduled Meds:  amoxicillin-clavulanate  1 tablet Oral Q12H   carvedilol  6.25 mg Oral BID WC   Chlorhexidine Gluconate Cloth  6 each Topical Daily   DULoxetine  30 mg Oral Daily   heparin injection (subcutaneous)  5,000 Units Subcutaneous Q8H   insulin aspart  0-15 Units Subcutaneous Q4H   insulin detemir  20 Units Subcutaneous BID   pantoprazole  40 mg Oral Daily   pregabalin  100 mg Oral TID   senna-docusate  1 tablet Oral BID   tamsulosin  0.4 mg Oral QPC supper   Continuous Infusions:  lactated ringers 100 mL/hr at 11/29/20 1013     LOS: 2 days   Time spent: [place], MD [facility] To contact the attending provider between 7A-7P or the covering provider during after hours 7P-7A, please log into the web site [email or web address] and access using universal [place] password for that web site. If you do not have the password, please call the hospital operator.  11/29/2020, 2:16 PM

## 2020-11-30 LAB — COMPREHENSIVE METABOLIC PANEL
ALT: 61 U/L — ABNORMAL HIGH (ref 0–44)
AST: 96 U/L — ABNORMAL HIGH (ref 15–41)
Albumin: 2.8 g/dL — ABNORMAL LOW (ref 3.5–5.0)
Alkaline Phosphatase: 66 U/L (ref 38–126)
Anion gap: 7 (ref 5–15)
BUN: 16 mg/dL (ref 8–23)
CO2: 27 mmol/L (ref 22–32)
Calcium: 8.8 mg/dL — ABNORMAL LOW (ref 8.9–10.3)
Chloride: 104 mmol/L (ref 98–111)
Creatinine, Ser: 0.82 mg/dL (ref 0.61–1.24)
GFR, Estimated: >60 mL/min (ref >60)
Glucose, Bld: 211 mg/dL — ABNORMAL HIGH (ref 70–99)
Potassium: 4 mmol/L (ref 3.5–5.1)
Sodium: 138 mmol/L (ref 135–145)
Total Bilirubin: 0.6 mg/dL (ref 0.3–1.2)
Total Protein: 5.8 g/dL — ABNORMAL LOW (ref 6.5–8.1)

## 2020-11-30 LAB — GLUCOSE, CAPILLARY
Glucose-Capillary: 180 mg/dL — ABNORMAL HIGH (ref 70–99)
Glucose-Capillary: 188 mg/dL — ABNORMAL HIGH (ref 70–99)
Glucose-Capillary: 228 mg/dL — ABNORMAL HIGH (ref 70–99)
Glucose-Capillary: 206 mg/dL — ABNORMAL HIGH (ref 70–99)
Glucose-Capillary: 211 mg/dL — ABNORMAL HIGH (ref 70–99)

## 2020-11-30 LAB — CBC
HCT: 39 % (ref 39.0–52.0)
Hemoglobin: 12.7 g/dL — ABNORMAL LOW (ref 13.0–17.0)
MCH: 27.7 pg (ref 26.0–34.0)
MCHC: 32.6 g/dL (ref 30.0–36.0)
MCV: 85 fL (ref 80.0–100.0)
Platelets: 223 10*3/uL (ref 150–400)
RBC: 4.59 MIL/uL (ref 4.22–5.81)
RDW: 13.5 % (ref 11.5–15.5)
WBC: 8.4 10*3/uL (ref 4.0–10.5)
nRBC: 0 % (ref 0.0–0.2)

## 2020-11-30 LAB — CK: Total CK: 2304 U/L — ABNORMAL HIGH (ref 49–397)

## 2020-11-30 NOTE — Progress Notes (Signed)
Two RNs assisted patient to sit on edge of bed. Patient was able to stand with considerable amount of assistance. Patient clearly would not have been able to stand without help.   Took approximately 2 steps forward and 2 steps back, short of breath, said he felt light headed.  VSS other than a little tachy from exertion.  Assisted patient back to bed.

## 2020-11-30 NOTE — Progress Notes (Signed)
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 Lovena Neighbours with left 6 French 26 cm JJ stent without tether an indwelling 38 French Foley placed  He is getting better slowly  Hospital-Problem based course  Sepsis on admission urinary cause De-escalate to Augmentin until 8/26- Urolithiasis status post cystoscopy 11/27/2020 Outpatient staged procedure for urology per urologist-hematuria resolved Patient was not eating much  and has not ambulated since procedure--RN notified to get out bed to chair and see if there are any needs AKI on admission 2/2 rhabdo, ACE prior to admission, sepsis Resolving well Rhabdo secondary to being found down Supportive management, cut back fluids to 50 cc/H and continue until discharge Lower extremity bilateral swelling, prior right malleolar fracture Wounds look pretty well-healed Patient is followed by dermatology in the outpatient setting for this Superobesity BMI 56 DM TY 2 Continue Levemir 20, sliding scale-sugars 1 88-2 28 Continue Lyrica 100 3 times daily   DVT prophylaxis: Heparin Code Status: Full Family Communication: None Disposition:  Status is: Inpatient  Remains inpatient appropriate because:Hemodynamically unstable, Altered mental status, and  Ongoing diagnostic testing needed not appropriate for outpatient work up  Dispo: The patient is from: Home              Anticipated d/c is to: Home              Patient currently is not medically stable to d/c.   Difficult to place patient No   Consultants:  Urology  Procedures: As above  Antimicrobials: Now on Augmentin   Subjective:  Only drinking Has not been out of bed Brother at the bedside Patient lives alone and is ambulatory prior to coming in  Objective: Vitals:   11/29/20 1307 11/29/20 2011 11/30/20 0438 11/30/20 1245  BP: (!) 156/58 (!) 156/62 (!) 147/66 (!) 148/81  Pulse: 79 85 (!) 101 83  Resp: '18 18 18 16  '$ Temp: 98.1 F (36.7 C) 98.2 F (36.8 C) 98.1 F (36.7 C) 98.2 F (36.8 C)  TempSrc: Oral Oral Oral Oral  SpO2: 94% 92% 93% 93%  Weight:      Height:        Intake/Output Summary (Last 24 hours) at 11/30/2020 1333 Last data filed at 11/30/2020 1154 Gross per 24 hour  Intake 1828.42 ml  Output 750 ml  Net 1078.42 ml    Filed Weights   11/26/20 2106  Weight: (!) 190.5 kg    Examination:  Awake coherent no distress Quite weak appearing CTA B no rales rhonchi Thick neck Mallampati 4 S1-S2 no murmur Midline upper epigastric scar + hernia No lower extremity rash however see wounds as below Neurologically intact moving 4 limbs       Data Reviewed: personally reviewed   CBC    Component Value Date/Time   WBC 8.4 11/30/2020 0636   RBC 4.59 11/30/2020 0636   HGB 12.7 (L) 11/30/2020  0636   HGB 13.9 10/09/2017 0757   HGB 13.8 01/09/2017 0843   HGB 13.3 09/14/2013 1027   HCT 39.0 11/30/2020 0636   HCT 40.2 01/09/2017 0843   HCT 40.3 09/14/2013 1027   PLT 223 11/30/2020 0636   PLT 249 10/09/2017 0757   PLT 267 01/09/2017 0843   PLT 255 09/14/2013 1027   MCV 85.0 11/30/2020 0636   MCV 85 01/09/2017 0843   MCV 80.5 09/14/2013 1027   MCH 27.7 11/30/2020 0636   MCHC 32.6 11/30/2020 0636   RDW 13.5 11/30/2020 0636   RDW 13.9  01/09/2017 0843   RDW 14.8 (H) 09/14/2013 1027   LYMPHSABS 0.8 11/27/2020 0632   LYMPHSABS 2.0 01/09/2017 0843   LYMPHSABS 1.5 09/14/2013 1027   MONOABS 0.8 11/27/2020 0632   MONOABS 0.5 09/14/2013 1027   EOSABS 0.1 11/27/2020 0632   EOSABS 0.3 01/09/2017 0843   BASOSABS 0.1 11/27/2020 0632   BASOSABS 0.0 01/09/2017 0843   BASOSABS 0.1 09/14/2013 1027   CMP Latest Ref Rng & Units 11/30/2020 11/29/2020 11/28/2020  Glucose 70 - 99 mg/dL 211(H) 221(H) 222(H)  BUN 8 - 23 mg/dL '16 17 20  '$ Creatinine 0.61 - 1.24 mg/dL 0.82 0.87 1.01  Sodium 135 - 145 mmol/L 138 139 136  Potassium 3.5 - 5.1 mmol/L 4.0 4.1 4.3  Chloride 98 - 111 mmol/L 104 105 105  CO2 22 - 32 mmol/L '27 26 24  '$ Calcium 8.9 - 10.3 mg/dL 8.8(L) 8.7(L) 8.4(L)  Total Protein 6.5 - 8.1 g/dL 5.8(L) - -  Total Bilirubin 0.3 - 1.2 mg/dL 0.6 - -  Alkaline Phos 38 - 126 U/L 66 - -  AST 15 - 41 U/L 96(H) - -  ALT 0 - 44 U/L 61(H) - -     Radiology Studies: No results found.   Scheduled Meds:  amoxicillin-clavulanate  1 tablet Oral Q12H   carvedilol  6.25 mg Oral BID WC   Chlorhexidine Gluconate Cloth  6 each Topical Daily   DULoxetine  30 mg Oral Daily   heparin injection (subcutaneous)  5,000 Units Subcutaneous Q8H   insulin aspart  0-15 Units Subcutaneous Q4H   insulin detemir  20 Units Subcutaneous BID   pantoprazole  40 mg Oral Daily   pregabalin  100 mg Oral TID   senna-docusate  1 tablet Oral BID   tamsulosin  0.4 mg Oral QPC supper   Continuous Infusions:  lactated ringers 50 mL/hr at 11/29/20 2143     LOS: 3 days   Time spent: 10  Nita Sells, MD [facility] To contact the attending provider between 7A-7P or the covering provider during after hours 7P-7A, please log into the web site [email or web address] and access using universal [place] password for that web site. If you do not have the password, please call the hospital operator.  11/30/2020, 1:33 PM

## 2020-11-30 NOTE — Progress Notes (Signed)
PT Cancellation Note  Patient Details Name: Brandon Schaefer MRN: [id number] DOB: Mar 25, 1956   Cancelled Treatment:    Reason Eval/Treat Not Completed: Patient declined, no reason specified Pt just stood with nursing and declines to get OOB again at this time.  Nursing reports heavy +2 assist for OOB to stand.  Discussed importance of moving and being OOB if going home, and pt felt too fatigued and weak to move again.  Requested RN call portables for bari-recliner for next time pt OOB.   Mischa Pollard,KATHrine E 11/30/2020, 2:16 PM Kati PT, DPT Acute Rehabilitation Services Pager: [phone] Office: [phone]

## 2020-12-01 LAB — CBC WITH DIFFERENTIAL/PLATELET
Abs Immature Granulocytes: 0.33 10*3/uL — ABNORMAL HIGH (ref 0.00–0.07)
Basophils Absolute: 0.1 10*3/uL (ref 0.0–0.1)
Basophils Relative: 2 %
Eosinophils Absolute: 0.4 10*3/uL (ref 0.0–0.5)
Eosinophils Relative: 5 %
HCT: 40.6 % (ref 39.0–52.0)
Hemoglobin: 13.2 g/dL (ref 13.0–17.0)
Immature Granulocytes: 4 %
Lymphocytes Relative: 19 %
Lymphs Abs: 1.6 10*3/uL (ref 0.7–4.0)
MCH: 27.6 pg (ref 26.0–34.0)
MCHC: 32.5 g/dL (ref 30.0–36.0)
MCV: 84.8 fL (ref 80.0–100.0)
Monocytes Absolute: 0.8 10*3/uL (ref 0.1–1.0)
Monocytes Relative: 9 %
Neutro Abs: 5.4 10*3/uL (ref 1.7–7.7)
Neutrophils Relative %: 61 %
Platelets: 264 10*3/uL (ref 150–400)
RBC: 4.79 MIL/uL (ref 4.22–5.81)
RDW: 13.4 % (ref 11.5–15.5)
WBC: 8.7 10*3/uL (ref 4.0–10.5)
nRBC: 0 % (ref 0.0–0.2)

## 2020-12-01 LAB — COMPREHENSIVE METABOLIC PANEL
ALT: 61 U/L — ABNORMAL HIGH (ref 0–44)
AST: 79 U/L — ABNORMAL HIGH (ref 15–41)
Albumin: 2.8 g/dL — ABNORMAL LOW (ref 3.5–5.0)
Alkaline Phosphatase: 60 U/L (ref 38–126)
Anion gap: 9 (ref 5–15)
BUN: 15 mg/dL (ref 8–23)
CO2: 27 mmol/L (ref 22–32)
Calcium: 8.7 mg/dL — ABNORMAL LOW (ref 8.9–10.3)
Chloride: 101 mmol/L (ref 98–111)
Creatinine, Ser: 0.8 mg/dL (ref 0.61–1.24)
GFR, Estimated: >60 mL/min (ref >60)
Glucose, Bld: 194 mg/dL — ABNORMAL HIGH (ref 70–99)
Potassium: 3.5 mmol/L (ref 3.5–5.1)
Sodium: 137 mmol/L (ref 135–145)
Total Bilirubin: 0.7 mg/dL (ref 0.3–1.2)
Total Protein: 5.7 g/dL — ABNORMAL LOW (ref 6.5–8.1)

## 2020-12-01 LAB — GLUCOSE, CAPILLARY
Glucose-Capillary: 187 mg/dL — ABNORMAL HIGH (ref 70–99)
Glucose-Capillary: 189 mg/dL — ABNORMAL HIGH (ref 70–99)
Glucose-Capillary: 189 mg/dL — ABNORMAL HIGH (ref 70–99)
Glucose-Capillary: 191 mg/dL — ABNORMAL HIGH (ref 70–99)
Glucose-Capillary: 196 mg/dL — ABNORMAL HIGH (ref 70–99)
Glucose-Capillary: 223 mg/dL — ABNORMAL HIGH (ref 70–99)

## 2020-12-01 NOTE — Progress Notes (Signed)
Inpatient Diabetes Program Recommendations  AACE/ADA: New Consensus Statement on Inpatient Glycemic Control (2015)  Target Ranges:  Prepandial:   less than 140 mg/dL      Peak postprandial:   less than 180 mg/dL (1-2 hours)      Critically ill patients:  140 - 180 mg/dL   Lab Results  Component Value Date   GLUCAP 196 (H) 12/01/2020   HGBA1C 8.8 (H) 11/10/2020    Review of Glycemic Control  Diabetes history: DM2 Outpatient Diabetes medications: 75/25 45 units QAM and 40 units QPM, metformin 1000 mg BID, Rybelsus 7 mg QD Current orders for Inpatient glycemic control: Levemir 20 units BID, Novolog 0-15 units Q4H  HgbA1C - 8.8%  Inpatient Diabetes Program Recommendations:    Increase Levemir to 22 units BID Add Novolog 4 units TID with meals if eating > 50%  Will follow glucose trends.  Thank you. Lorenda Peck, RD, LDN, CDE Inpatient Diabetes Coordinator [phone]

## 2020-12-01 NOTE — Evaluation (Signed)
Physical Therapy Evaluation Patient Details Name: Brandon Schaefer MRN: [id number] DOB: [birth year] Today's Date: 12/01/2020   History of Present Illness  Pt is a 65 year old male admitted with sepsis on admission urinary cause and Urolithiasis status post cystoscopy 11/27/2020. PMHx significant of type 2 diabetes mellitus, peripheral neuropathy, hypertension anxiety,colon cancer right medial malleolus fracture, cellulitis  Clinical Impression  Pt admitted with above diagnosis. Pt currently with functional limitations due to the deficits listed below (see PT Problem List). Pt will benefit from skilled PT to increase their independence and safety with mobility to allow discharge to the venue listed below.  Pt requesting to urinate prior to mobility so assisted with urinal.  Pt's right LE dressing unwrapped on arrival so reapplied kerlix to keep wounds covered (also notified RN).  Pt able to sit EOB, stand and ambulate in room with RW and min/guard assist for safety today.  Pt feels able to return home upon d/c however needs to remain mobile for remainder of hospitalization.  Pt encouraged to stay OOB in recliner end of session.     Follow Up Recommendations No PT follow up    Equipment Recommendations  None recommended by PT    Recommendations for Other Services       Precautions / Restrictions Precautions Precautions: Fall Restrictions Weight Bearing Restrictions: No      Mobility  Bed Mobility Overal bed mobility: Needs Assistance Bed Mobility: Supine to Sit     Supine to sit: Min guard;HOB elevated     General bed mobility comments: pt utilized bed rail and HHA to assist himself upright with use of UEs    Transfers Overall transfer level: Needs assistance Equipment used: Rolling walker (2 wheeled) Transfers: Sit to/from [facility] Sit to Stand: Min guard;From elevated surface Stand pivot transfers: Min guard       General transfer comment: min/guard for  safety  Ambulation/Gait Ambulation/Gait assistance: Min guard Gait Distance (Feet): 20 Feet Assistive device: Rolling walker (2 wheeled) Gait Pattern/deviations: Step-through pattern;Decreased stride length     General Gait Details: pt preferred to remain in room, fatigued quickly, moderate dyspnea, SpO2 96% on room air   (Utilized +2 for safety due to morbidly obese however did not require physical assist)  Stairs            Wheelchair Mobility    Modified Rankin (Stroke Patients Only)       Balance Overall balance assessment: Needs assistance;History of Falls         Standing balance support: Bilateral upper extremity supported Standing balance-Leahy Scale: Poor Standing balance comment: reliant on UE support                             Pertinent Vitals/Pain Pain Assessment: No/denies pain    Home Living Family/patient expects to be discharged to:: Private residence Living Arrangements: Alone   Type of Home: House Home Access: Level entry     Home Layout: One level Home Equipment: Environmental consultant - 2 wheels      Prior Function Level of Independence: Independent         Comments: sleeps in recliner     Hand Dominance        Extremity/Trunk Assessment        Lower Extremity Assessment Lower Extremity Assessment: Overall WFL for tasks assessed    Cervical / Trunk Assessment Cervical / Trunk Assessment:  (body habitus limiting)  Communication   Communication: No  difficulties  Cognition Arousal/Alertness: Awake/alert Behavior During Therapy: Flat affect;WFL for tasks assessed/performed Overall Cognitive Status: Within Functional Limits for tasks assessed                                        General Comments      Exercises     Assessment/Plan    PT Assessment Patient needs continued PT services  PT Problem List Decreased mobility;Decreased activity tolerance;Decreased knowledge of use of DME       PT  Treatment Interventions Gait training;DME instruction;Therapeutic exercise;Functional mobility training;Therapeutic activities;Patient/family education    PT Goals (Current goals can be found in the Care Plan section)  Acute Rehab PT Goals PT Goal Formulation: With patient Time For Goal Achievement: 12/15/20 Potential to Achieve Goals: Good    Frequency Min 3X/week   Barriers to discharge        Co-evaluation               AM-PAC PT "6 Clicks" Mobility  Outcome Measure Help needed turning from your back to your side while in a flat bed without using bedrails?: A Little Help needed moving from lying on your back to sitting on the side of a flat bed without using bedrails?: A Little Help needed moving to and from a bed to a chair (including a wheelchair)?: A Little Help needed standing up from a chair using your arms (e.g., wheelchair or bedside chair)?: A Little Help needed to walk in hospital room?: A Little Help needed climbing 3-5 steps with a railing? : A Lot 6 Click Score: 17    End of Session   Activity Tolerance: Patient tolerated treatment well Patient left: in chair;with call bell/phone within reach;with chair alarm set Nurse Communication: Mobility status PT Visit Diagnosis: Difficulty in walking, not elsewhere classified (R26.2)    Time: [id number] PT Time Calculation (min) (ACUTE ONLY): 30 min   Charges:   PT Evaluation $PT Eval Low Complexity: 1 Low     Kati PT, DPT Acute Rehabilitation Services Pager: [phone] Office: [phone]  [place] Ram E 12/01/2020, 4:49 PM

## 2020-12-01 NOTE — Progress Notes (Signed)
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 Lovena Neighbours with left 6 French 26 cm JJ stent without tether an indwelling 51 French Foley placed  He is getting better slowly  Hospital-Problem based course  Sepsis on admission urinary cause De-escalate to Augmentin until 8/26-no further work-up required Urolithiasis status post cystoscopy 11/27/2020 Outpatient staged procedure for urology per urologist-hematuria resolved--urology has signed off AKI on admission 2/2 rhabdo, ACE prior to admission, sepsis Resolving well Rhabdo secondary to being found down Supportive management, cut back fluids to 50 cc/H and continue until discharge Therapy has seen the patient is cleared him to go home with home health PT Lower extremity bilateral swelling, prior right malleolar fracture Wounds look pretty well-healed and this was reviewed by myself on 8/19 Patient is followed by dermatology in the outpatient setting for this Superobesity BMI 56 DM TY 2 Continue Levemir 20, sliding scale-sugars 1 88-2 28 Continue Lyrica 100 3 times daily   DVT prophylaxis: Heparin Code Status: Full Family Communication: None Disposition:  Status is: Inpatient  Remains inpatient appropriate because:Hemodynamically unstable,  Altered mental status, and Ongoing diagnostic testing needed not appropriate for outpatient work up  Dispo: The patient is from: Home              Anticipated d/c is to: Home              Patient currently is not medically stable to d/c.   Difficult to place patient No   Consultants:  Urology  Procedures: As above  Antimicrobials: Now on Augmentin   Subjective:  Eating some relatively immobile no chest pain no fever no chills seems quite dejected for some reason Overall does not feel completely well and p.o. intake is not good  Objective: Vitals:   11/30/20 2026 12/01/20 0529 12/01/20 1000 12/01/20 1705  BP: (!) 147/68 (!) 153/70 (!) 169/81 (!) 141/72  Pulse: 74 97 93 77  Resp: '18 18 18 '$ (!) 21  Temp: 98.1 F (36.7 C) 97.9 F (36.6 C) 98 F (36.7 C) 98.4 F (36.9 C)  TempSrc: Oral Oral Oral Oral  SpO2: 95% 94% 96% 94%  Weight:      Height:        Intake/Output Summary (Last 24 hours) at 12/01/2020 1802 Last data filed at 12/01/2020 1219 Gross per 24 hour  Intake 1660.8 ml  Output 1000 ml  Net 660.8 ml    Filed Weights   11/26/20 2106  Weight: (!) 190.5 kg    Examination:  Coherent pleasant Very poor dentition CTA B no rales rhonchi Thick neck Mallampati 4 S1-S2 no murmur Midline upper epigastric scar + hernia Wounds reviewed lower extremities and leg like they are well-healed   Data Reviewed: personally reviewed   CBC    Component Value Date/Time   WBC 8.7 12/01/2020 0509   RBC 4.79 12/01/2020 0509   HGB 13.2 12/01/2020 0509  HGB 13.9 10/09/2017 0757   HGB 13.8 01/09/2017 0843   HGB 13.3 09/14/2013 1027   HCT 40.6 12/01/2020 0509   HCT 40.2 01/09/2017 0843   HCT 40.3 09/14/2013 1027   PLT 264 12/01/2020 0509   PLT 249 10/09/2017 0757   PLT 267 01/09/2017 0843   PLT 255 09/14/2013 1027   MCV 84.8 12/01/2020 0509   MCV 85 01/09/2017 0843   MCV 80.5 09/14/2013 1027   MCH 27.6 12/01/2020 0509   MCHC 32.5 12/01/2020 0509   RDW 13.4  12/01/2020 0509   RDW 13.9 01/09/2017 0843   RDW 14.8 (H) 09/14/2013 1027   LYMPHSABS 1.6 12/01/2020 0509   LYMPHSABS 2.0 01/09/2017 0843   LYMPHSABS 1.5 09/14/2013 1027   MONOABS 0.8 12/01/2020 0509   MONOABS 0.5 09/14/2013 1027   EOSABS 0.4 12/01/2020 0509   EOSABS 0.3 01/09/2017 0843   BASOSABS 0.1 12/01/2020 0509   BASOSABS 0.0 01/09/2017 0843   BASOSABS 0.1 09/14/2013 1027   CMP Latest Ref Rng & Units 12/01/2020 11/30/2020 11/29/2020  Glucose 70 - 99 mg/dL 194(H) 211(H) 221(H)  BUN 8 - 23 mg/dL '15 16 17  '$ Creatinine 0.61 - 1.24 mg/dL 0.80 0.82 0.87  Sodium 135 - 145 mmol/L 137 138 139  Potassium 3.5 - 5.1 mmol/L 3.5 4.0 4.1  Chloride 98 - 111 mmol/L 101 104 105  CO2 22 - 32 mmol/L '27 27 26  '$ Calcium 8.9 - 10.3 mg/dL 8.7(L) 8.8(L) 8.7(L)  Total Protein 6.5 - 8.1 g/dL 5.7(L) 5.8(L) -  Total Bilirubin 0.3 - 1.2 mg/dL 0.7 0.6 -  Alkaline Phos 38 - 126 U/L 60 66 -  AST 15 - 41 U/L 79(H) 96(H) -  ALT 0 - 44 U/L 61(H) 61(H) -     Radiology Studies: No results found.   Scheduled Meds:  amoxicillin-clavulanate  1 tablet Oral Q12H   carvedilol  6.25 mg Oral BID WC   Chlorhexidine Gluconate Cloth  6 each Topical Daily   DULoxetine  30 mg Oral Daily   heparin injection (subcutaneous)  5,000 Units Subcutaneous Q8H   insulin aspart  0-15 Units Subcutaneous Q4H   insulin detemir  20 Units Subcutaneous BID   pantoprazole  40 mg Oral Daily   pregabalin  100 mg Oral TID   senna-docusate  1 tablet Oral BID   tamsulosin  0.4 mg Oral QPC supper   Continuous Infusions:  lactated ringers 50 mL/hr at 12/01/20 1220     LOS: 4 days   Time spent: Zap, MD [facility] To contact the attending provider between 7A-7P or the covering provider during after hours 7P-7A, please log into the web site [email or web address] and access using universal [place] password for that web site. If you do not have the password, please call the hospital operator.  12/01/2020, 6:03 PM

## 2020-12-01 NOTE — Care Management Important Message (Signed)
Important Message  Patient Details Verbal Consent given Verbally Via Phone. Name: Brandon Schaefer MRN: [id number] Date of Birth: 28-Mar-1956   Medicare Important Message Given:  Yes     Kerin Salen 12/01/2020, 12:22 PM

## 2020-12-01 NOTE — Plan of Care (Signed)

## 2020-12-02 LAB — CULTURE, BLOOD (ROUTINE X 2)
Culture: NO GROWTH
Culture: NO GROWTH
Special Requests: ADEQUATE
Special Requests: ADEQUATE

## 2020-12-02 LAB — GLUCOSE, CAPILLARY
Glucose-Capillary: 188 mg/dL — ABNORMAL HIGH (ref 70–99)
Glucose-Capillary: 193 mg/dL — ABNORMAL HIGH (ref 70–99)
Glucose-Capillary: 221 mg/dL — ABNORMAL HIGH (ref 70–99)
Glucose-Capillary: 250 mg/dL — ABNORMAL HIGH (ref 70–99)
Glucose-Capillary: 234 mg/dL — ABNORMAL HIGH (ref 70–99)

## 2020-12-02 MED ORDER — KETOROLAC TROMETHAMINE 30 MG/ML IJ SOLN
15.0000 mg | Freq: Once | INTRAMUSCULAR | Status: AC
  Administered 2020-12-02: 15 mg via INTRAVENOUS
  Filled 2020-12-02: qty 1

## 2020-12-02 MED ORDER — KETOROLAC TROMETHAMINE 10 MG PO TABS
10.0000 mg | ORAL_TABLET | Freq: Three times a day (TID) | ORAL | Status: DC
  Administered 2020-12-02 – 2020-12-06 (×13): 10 mg via ORAL
  Filled 2020-12-02 (×14): qty 1

## 2020-12-02 MED ORDER — CYCLOBENZAPRINE HCL 5 MG PO TABS
5.0000 mg | ORAL_TABLET | Freq: Once | ORAL | Status: AC
  Administered 2020-12-02: 5 mg via ORAL
  Filled 2020-12-02: qty 1

## 2020-12-02 NOTE — TOC Progression Note (Addendum)
Transition of Care [facility]) - Progression Note    Patient Details  Name: Brandon Schaefer MRN: [id number] Date of Birth: 12-15-1955  [facility] of [facility] CM/SW Contact  Purcell Mouton, RN Phone Number: 12/02/2020, 2:12 PM  Clinical Narrative:    Spoke with pt's brother Brandon Schaefer concerning pt's discharge.  Brandon Schaefer states that is brother is a High Fall Risk, pt lives alone, he has not walked in the hall since being here in the hospital and he very concerned about pt going home. Please call pt's brother Brandon Schaefer on his cell phone concerning pt's progress. TOC will continue to follow.         Expected Discharge Plan and Services                                                 Social Determinants of Health (SDOH) Interventions    Readmission Risk Interventions No flowsheet data found.

## 2020-12-02 NOTE — Progress Notes (Addendum)
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 Lovena Neighbours with left 6 French 26 cm JJ stent without tether an indwelling 65 French Foley placed  He is getting better slowly--Main disposition impediment currently is his immobility  Hospital-Problem based course  Sepsis on admission urinary cause De-escalate to Augmentin until 8/26-no further work-up required at this juncture Urolithiasis status post cystoscopy 11/27/2020 Outpatient staged procedure for urology per urologist-hematuria resolved--urology input appreciated AKI on admission 2/2 rhabdo, ACE prior to admission, sepsis Resolving well Rhabdo secondary to being found down DC IV fluid Therapy has seen the patient --he was cleared for discharge but remains a high fall risk according to brother and lives alone I have asked that therapy come back and see him 1 more time to clarify disposition Lower extremity bilateral swelling, prior right malleolar fracture Reviewed wounds on 8/18, 8/19  Can follow withdermatology in the outpatient setting for this Superobesity BMI 56 DM TY 2 Continue Levemir 20 bid, sliding scale-sugars 1 88-2 25  Continue Lyrica 100 3 times daily   DVT prophylaxis: Heparin Code Status: Full Family Communication:  None Disposition:  Status is: Inpatient  Remains inpatient appropriate because:Hemodynamically unstable, Altered mental status, and Ongoing diagnostic testing needed not appropriate for outpatient work up  Dispo: The patient is from: Home              Anticipated d/c is to: Home              Patient currently is not medically stable to d/c.   Difficult to place patient No   Consultants:  Urology  Procedures: As above  Antimicrobials: Now on Augmentin   Subjective:  Overall patient seems quite depressed-he complains about being left in the chair for 2 hours yesterday and is unable to use the walker properly He has some abdominal pain but does not think he is eating adequately-it looks like he is eating only 25 to 50% of meals-no fever no chills no nausea coherent flat affect No family present today  Objective: Vitals:   12/01/20 1705 12/01/20 2010 12/02/20 0536 12/02/20 1440  BP: (!) 141/72 (!) 176/79 (!) 153/89 (!) 166/80  Pulse: 77 75 (!) 108 88  Resp: (!) '21 18 20 14  '$ Temp: 98.4 F (36.9 C) 98.1 F (36.7 C) 98.6 F (37 C) 98.2 F (36.8 C)  TempSrc: Oral   Oral  SpO2: 94% 94% 92% 95%  Weight:      Height:        Intake/Output Summary (Last 24 hours) at 12/02/2020 1538 Last data filed at 12/02/2020 0900 Gross per 24 hour  Intake 1340.42 ml  Output 1000 ml  Net 340.42 ml    Filed Weights   11/26/20 2106  Weight: (!) 190.5 kg    Examination:  Poor dentition, flat affect Mallampati 4 S1-S2 no murmur Abdomen obese distended with epigastric hernia Tympanitic abdomen  Lower extremity swollen-I did not review the wounds today as they have been stable over the past several days Neurologically intact moving all 4 limbs equally   Data Reviewed: personally reviewed   CBC    Component Value Date/Time   WBC 8.7 12/01/2020 0509   RBC 4.79 12/01/2020 0509   HGB 13.2 12/01/2020 0509   HGB 13.9 10/09/2017 0757   HGB 13.8 01/09/2017 0843   HGB 13.3 09/14/2013  1027   HCT 40.6 12/01/2020 0509   HCT 40.2 01/09/2017 0843   HCT 40.3 09/14/2013 1027   PLT 264 12/01/2020 0509   PLT 249 10/09/2017 0757   PLT 267 01/09/2017 0843   PLT 255 09/14/2013 1027   MCV 84.8 12/01/2020 0509   MCV 85 01/09/2017 0843   MCV 80.5 09/14/2013 1027   MCH 27.6 12/01/2020 0509   MCHC 32.5 12/01/2020 0509   RDW 13.4 12/01/2020 0509   RDW 13.9 01/09/2017 0843   RDW 14.8 (H) 09/14/2013 1027   LYMPHSABS 1.6 12/01/2020 0509   LYMPHSABS 2.0 01/09/2017 0843   LYMPHSABS 1.5 09/14/2013 1027   MONOABS 0.8 12/01/2020 0509   MONOABS 0.5 09/14/2013 1027   EOSABS 0.4 12/01/2020 0509   EOSABS 0.3 01/09/2017 0843   BASOSABS 0.1 12/01/2020 0509   BASOSABS 0.0 01/09/2017 0843   BASOSABS 0.1 09/14/2013 1027   CMP Latest Ref Rng & Units 12/01/2020 11/30/2020 11/29/2020  Glucose 70 - 99 mg/dL 194(H) 211(H) 221(H)  BUN 8 - 23 mg/dL '15 16 17  '$ Creatinine 0.61 - 1.24 mg/dL 0.80 0.82 0.87  Sodium 135 - 145 mmol/L 137 138 139  Potassium 3.5 - 5.1 mmol/L 3.5 4.0 4.1  Chloride 98 - 111 mmol/L 101 104 105  CO2 22 - 32 mmol/L '27 27 26  '$ Calcium 8.9 - 10.3 mg/dL 8.7(L) 8.8(L) 8.7(L)  Total Protein 6.5 - 8.1 g/dL 5.7(L) 5.8(L) -  Total Bilirubin 0.3 - 1.2 mg/dL 0.7 0.6 -  Alkaline Phos 38 - 126 U/L 60 66 -  AST 15 - 41 U/L 79(H) 96(H) -  ALT 0 - 44 U/L 61(H) 61(H) -     Radiology Studies: No results found.   Scheduled Meds:  amoxicillin-clavulanate  1 tablet Oral Q12H   carvedilol  6.25 mg Oral BID WC   Chlorhexidine Gluconate Cloth  6 each Topical Daily   DULoxetine  30 mg Oral Daily   heparin injection (subcutaneous)  5,000 Units Subcutaneous Q8H   insulin aspart  0-15 Units Subcutaneous Q4H   insulin detemir  20 Units Subcutaneous BID   ketorolac  10 mg Oral Q8H   pantoprazole  40 mg Oral Daily   pregabalin  100 mg Oral TID   senna-docusate  1 tablet Oral BID   tamsulosin  0.4 mg Oral QPC supper   Continuous Infusions:   LOS: 5 days   Time spent: 64  Nita Sells, MD [facility] To contact the attending provider between 7A-7P or the covering provider during after hours 7P-7A, please log into the web site [email or web address] and access using universal [place] password for that web site. If you do not have the password, please call the hospital operator.  12/02/2020, 3:38 PM

## 2020-12-02 NOTE — Progress Notes (Signed)
5 Days Post-Op   Subjective/Chief Complaint:   1 - Recurrent Large Volume Urolithiasis  2014 - Bilateral multistage PCNL 2022 - Left aprpox 2cm stone, Left ureteral 69m x2, Right approx 1.2 cm stone by CT == > Left JJ stent placed  2 - Obstructing Pyelonephritis - fever, leukocystosis, acidosis at presentation this admission and CT with left obstructing stone, now stented. ULenna Gilford8/14 pending / no growth to date. Placed on empiric cefipime. No recent prior CX's from hospital or our office to further guide therapy.  Today "CVicente is improving. No fevers in 24 hours. Prior CX's remain non-clonal. He still feel pretty weak and not back to baseline (which is admittedly limited). Does c/wo some mild stent colic, GFR acceptable.    Objective: Vital signs in last 24 hours: Temp:  [98 F (36.7 C)-98.6 F (37 C)] 98.6 F (37 C) (08/20 0536) Pulse Rate:  [75-108] 108 (08/20 0536) Resp:  [18-21] 20 (08/20 0536) BP: (141-176)/(72-89) 153/89 (08/20 0536) SpO2:  [92 %-96 %] 92 % (08/20 0536) Last BM Date: 11/30/20  Intake/Output from previous day: 08/19 0701 - 08/20 0700 In: 1580.4 [P.O.:480; I.V.:1100.4] Out: 1300 [Urine:1300] Intake/Output this shift: No intake/output data recorded.   EXAM: NAD, AOx3, Somewhat ill appearing in bed,  Non-labored breathing RRR Stable morbid truncal obesity limits sensitivity of exam. Stable buried penis Stable LE edema that is chronic to knee.    Lab Results:  Recent Labs    11/30/20 0636 12/01/20 0509  WBC 8.4 8.7  HGB 12.7* 13.2  HCT 39.0 40.6  PLT 223 264   BMET Recent Labs    11/30/20 0636 12/01/20 0509  NA 138 137  K 4.0 3.5  CL 104 101  CO2 27 27  GLUCOSE 211* 194*  BUN 16 15  CREATININE 0.82 0.80  CALCIUM 8.8* 8.7*   PT/INR No results for input(s): LABPROT, INR in the last 72 hours. ABG No results for input(s): PHART, HCO3 in the last 72 hours.  Invalid input(s): PCO2, PO2  Studies/Results: No results  found.  Anti-infectives: Anti-infectives (From admission, onward)    Start     Dose/Rate Route Frequency Ordered Stop   11/29/20 2200  amoxicillin-clavulanate (AUGMENTIN) 875-125 MG per tablet 1 tablet        1 tablet Oral Every 12 hours 11/29/20 1411     11/29/20 1000  vancomycin (VANCOREADY) IVPB 1750 mg/350 mL  Status:  Discontinued        1,750 mg 175 mL/hr over 120 Minutes Intravenous Every 24 hours 11/28/20 0848 11/28/20 1343   11/28/20 0400  vancomycin (VANCOCIN) 2,500 mg in sodium chloride 0.9 % 500 mL IVPB  Status:  Discontinued        2,500 mg 250 mL/hr over 120 Minutes Intravenous Every 24 hours 11/27/20 0441 11/28/20 0848   11/27/20 0445  vancomycin (VANCOREADY) IVPB 2000 mg/400 mL        2,000 mg 200 mL/hr over 120 Minutes Intravenous  Once 11/27/20 0432 11/27/20 0806   11/27/20 0445  ceFEPIme (MAXIPIME) 2 g in sodium chloride 0.9 % 100 mL IVPB  Status:  Discontinued        2 g 200 mL/hr over 30 Minutes Intravenous Every 8 hours 11/27/20 0432 11/29/20 1411   11/26/20 2200  doxycycline (VIBRAMYCIN) 100 mg in sodium chloride 0.9 % 250 mL IVPB        100 mg 125 mL/hr over 120 Minutes Intravenous  Once 11/26/20 2149 11/27/20 0129  Assessment/Plan:  1 - Recurrent Large Volume Urolithiasis - now temporized with stenting. Discussed options and we agree on path to stone free with bilateral ureteroscopy in elective setting after back to baseline (estimate abtou 1 month). Discused may require staged approach, but fortunately does not require major surgery (percutanous approach). Will proceed with scheduled in elective setting and arranging GU follow up (request sibmitted, scheduling pending). NO further stone directed care this admission.   2 - Obstructing Pyelonephritis - improving by objetive parameters on empiric ABX. Rec transition to similar broad spectrum PO regimen (augmentin or similar) for 10-14 days total course if no more specific info avail from CX's this  admission.  I ordered PO ketorloc Q8 x several days to help with mild stent colic, his GFR appears acceptable for this.   Will follow PRN at this point, please call me directly with questions anytime. Greatly appreciate hospitalist team comangement.    Alexis Frock 12/02/2020

## 2020-12-03 LAB — CBC WITH DIFFERENTIAL/PLATELET
Abs Immature Granulocytes: 0.7 10*3/uL — ABNORMAL HIGH (ref 0.00–0.07)
Band Neutrophils: 1 %
Basophils Absolute: 0.1 10*3/uL (ref 0.0–0.1)
Basophils Relative: 1 %
Eosinophils Absolute: 0.2 10*3/uL (ref 0.0–0.5)
Eosinophils Relative: 2 %
HCT: 40.9 % (ref 39.0–52.0)
Hemoglobin: 13.4 g/dL (ref 13.0–17.0)
Lymphocytes Relative: 16 %
Lymphs Abs: 1.9 10*3/uL (ref 0.7–4.0)
MCH: 27.9 pg (ref 26.0–34.0)
MCHC: 32.8 g/dL (ref 30.0–36.0)
MCV: 85 fL (ref 80.0–100.0)
Metamyelocytes Relative: 2 %
Monocytes Absolute: 0.6 10*3/uL (ref 0.1–1.0)
Monocytes Relative: 5 %
Myelocytes: 4 %
Neutro Abs: 8.2 10*3/uL — ABNORMAL HIGH (ref 1.7–7.7)
Neutrophils Relative %: 69 %
Platelets: 307 10*3/uL (ref 150–400)
RBC: 4.81 MIL/uL (ref 4.22–5.81)
RDW: 13.6 % (ref 11.5–15.5)
WBC: 11.7 10*3/uL — ABNORMAL HIGH (ref 4.0–10.5)
nRBC: 0 % (ref 0.0–0.2)

## 2020-12-03 LAB — COMPREHENSIVE METABOLIC PANEL
ALT: 71 U/L — ABNORMAL HIGH (ref 0–44)
AST: 61 U/L — ABNORMAL HIGH (ref 15–41)
Albumin: 2.9 g/dL — ABNORMAL LOW (ref 3.5–5.0)
Alkaline Phosphatase: 69 U/L (ref 38–126)
Anion gap: 6 (ref 5–15)
BUN: 19 mg/dL (ref 8–23)
CO2: 30 mmol/L (ref 22–32)
Calcium: 8.4 mg/dL — ABNORMAL LOW (ref 8.9–10.3)
Chloride: 99 mmol/L (ref 98–111)
Creatinine, Ser: 0.76 mg/dL (ref 0.61–1.24)
GFR, Estimated: >60 mL/min (ref >60)
Glucose, Bld: 191 mg/dL — ABNORMAL HIGH (ref 70–99)
Potassium: 3.2 mmol/L — ABNORMAL LOW (ref 3.5–5.1)
Sodium: 135 mmol/L (ref 135–145)
Total Bilirubin: 0.7 mg/dL (ref 0.3–1.2)
Total Protein: 5.8 g/dL — ABNORMAL LOW (ref 6.5–8.1)

## 2020-12-03 LAB — GLUCOSE, CAPILLARY
Glucose-Capillary: 168 mg/dL — ABNORMAL HIGH (ref 70–99)
Glucose-Capillary: 178 mg/dL — ABNORMAL HIGH (ref 70–99)
Glucose-Capillary: 197 mg/dL — ABNORMAL HIGH (ref 70–99)
Glucose-Capillary: 232 mg/dL — ABNORMAL HIGH (ref 70–99)
Glucose-Capillary: 234 mg/dL — ABNORMAL HIGH (ref 70–99)
Glucose-Capillary: 250 mg/dL — ABNORMAL HIGH (ref 70–99)

## 2020-12-03 MED ORDER — POTASSIUM CHLORIDE CRYS ER 20 MEQ PO TBCR
40.0000 meq | EXTENDED_RELEASE_TABLET | Freq: Every day | ORAL | Status: DC
  Administered 2020-12-03 – 2020-12-06 (×4): 40 meq via ORAL
  Filled 2020-12-03 (×4): qty 2

## 2020-12-03 MED ORDER — ONDANSETRON HCL 4 MG/2ML IJ SOLN
4.0000 mg | Freq: Three times a day (TID) | INTRAMUSCULAR | Status: DC | PRN
  Administered 2020-12-03 – 2020-12-04 (×2): 4 mg via INTRAVENOUS
  Filled 2020-12-03 (×2): qty 2

## 2020-12-03 NOTE — Progress Notes (Signed)
Physical Therapy Treatment Patient Details Name: Brandon Schaefer MRN: [id number] DOB: [birth year] Today's Date: 12/03/2020    History of Present Illness Pt is a 65 year old male admitted with sepsis on admission urinary cause and Urolithiasis status post cystoscopy 11/27/2020. PMHx significant of type 2 diabetes mellitus, peripheral neuropathy, hypertension anxiety,colon cancer right medial malleolus fracture, cellulitis    PT Comments    Pt OOB in recliner.  Stated he was able to amb to bathroom with his walker with nursing earlier.  Admits to fatigue after activity.  General Comments: AxO x 3 pleasant but very apprehensive about "going home too early" cause last time he was D/C he "ended right back here".  Pt admits his lifestyle has deminished since COVID.  He did not leave the house much.  Because of his CA he was afraid he was too high risk. General transfer comment: pt self able to rise and lower without any physical assist needed.  Good use of hands to steady self. General Gait Details: pt was able to amb 22 feet into hallway before requesting a seated rest break in recliner that was following. Gait progressing. Pt appears at baseline and plans to return home with intermittent help from brother.   Follow Up Recommendations  No PT follow up     Equipment Recommendations  None recommended by PT    Recommendations for Other Services       Precautions / Restrictions Precautions Precautions: Fall Restrictions Weight Bearing Restrictions: No    Mobility  Bed Mobility               General bed mobility comments: OOB in recliner    Transfers Overall transfer level: Needs assistance Equipment used: Rolling walker (2 wheeled) Judie Petit) Transfers: Sit to/from [employer] to Stand: Supervision Stand pivot transfers: Supervision       General transfer comment: pt self able to rise and lower without any physical assist needed.  Good use of hands to steady  self.  Ambulation/Gait Ambulation/Gait assistance: Supervision Gait Distance (Feet): 44 Feet (22 feet x 2 one seated rest break) Assistive device: Rolling walker (2 wheeled) Gait Pattern/deviations: Step-through pattern;Decreased stride length Gait velocity: decreased   General Gait Details: pt was able to amb 22 feet into hallway before requesting a seated rest break in recliner that was following. Gait progressing.   Stairs             Wheelchair Mobility    Modified Rankin (Stroke Patients Only)       Balance                                            Cognition Arousal/Alertness: Awake/alert Behavior During Therapy: Flat affect;WFL for tasks assessed/performed Overall Cognitive Status: Within Functional Limits for tasks assessed                                 General Comments: AxO x 3 pleasant but very apprehensive about "going home too early" cause last time he was D/C he "ended right back here".  Pt admits his lifestyle has deminished since COVID.  He did not leave the house much.  Because of his CA he was afraid he was too high risk.      Exercises      General Comments  Pertinent Vitals/Pain Pain Assessment: 0-10 Pain Score: 3  Pain Location: L knee "arthritis" and R LE "burning" red rashy area Pain Descriptors / Indicators: Aching;Discomfort;Grimacing    Home Living                      Prior Function            PT Goals (current goals can now be found in the care plan section) Progress towards PT goals: Progressing toward goals    Frequency    Min 3X/week      PT Plan Current plan remains appropriate    Co-evaluation              AM-PAC PT "6 Clicks" Mobility   Outcome Measure  Help needed turning from your back to your side while in a flat bed without using bedrails?: A Little Help needed moving from lying on your back to sitting on the side of a flat bed without using  bedrails?: A Little Help needed moving to and from a bed to a chair (including a wheelchair)?: A Little Help needed standing up from a chair using your arms (e.g., wheelchair or bedside chair)?: A Little Help needed to walk in hospital room?: A Little Help needed climbing 3-5 steps with a railing? : A Little 6 Click Score: 18    End of Session Equipment Utilized During Treatment: Gait belt Activity Tolerance: Patient tolerated treatment well Patient left: in chair;with call bell/phone within reach;with chair alarm set Nurse Communication: Mobility status PT Visit Diagnosis: Difficulty in walking, not elsewhere classified (R26.2)     Time: [id number] PT Time Calculation (min) (ACUTE ONLY): 24 min  Charges:  $Gait Training: 8-22 mins $Therapeutic Activity: 8-22 mins                     Rica Koyanagi  PTA Acute  Rehabilitation Services Pager      [phone] Office      [phone]

## 2020-12-03 NOTE — Plan of Care (Signed)
  Problem: Education: Goal: Knowledge of General Education information will improve Description: Including pain rating scale, medication(s)/side effects and non-pharmacologic comfort measures Outcome: Progressing   Problem: Health Behavior/Discharge Planning: Goal: Ability to manage health-related needs will improve Outcome: Progressing   Problem: Clinical Measurements: Goal: Diagnostic test results will improve Outcome: Progressing Goal: Respiratory complications will improve Outcome: Progressing   Problem: Activity: Goal: Risk for activity intolerance will decrease Outcome: Progressing   Problem: Nutrition: Goal: Adequate nutrition will be maintained Outcome: Progressing   Problem: Pain Managment: Goal: General experience of comfort will improve Outcome: Progressing   Problem: Safety: Goal: Ability to remain free from injury will improve Outcome: Progressing

## 2020-12-03 NOTE — Progress Notes (Addendum)
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 Lovena Neighbours with left 6 French 26 cm JJ stent without tether an indwelling 71 French Foley placed  He is getting better slowly--Main disposition impediment currently is his immobility  Hospital-Problem based course  Sepsis on admission urinary cause De-escalate to Augmentin until 8/26-no further work-up required at this juncture Nearing discharge if tolerating p.o. Urolithiasis status post cystoscopy 11/27/2020 Outpatient staged procedure  per urologist-hematuria resolved--urology input appreciated AKI on admission 2/2 rhabdo, ACE prior to admission, sepsis Hypokalemia AKI resolving well, replace potassium with oral replacement Rhabdo secondary to being found down DC IV fluid Therapy has cleared the patient for discharge and discussed with the patient's family Lower extremity bilateral swelling, prior right malleolar fracture Reviewed wounds on 8/18, 8/19  Can follow withdermatology in the outpatient setting for this Superobesity BMI 56 DM TY 2 Continue Levemir 20 bid, sliding scale-sugars 1 70-2 50 Continue Lyrica 100 3 times daily   DVT prophylaxis: Heparin Code Status: Full Family Communication: None Disposition:  Status is:  Inpatient  Remains inpatient appropriate because:Hemodynamically unstable, Altered mental status, and Ongoing diagnostic testing needed not appropriate for outpatient work up  Dispo: The patient is from: Home              Anticipated d/c is to: Home              Patient currently is not medically stable to d/c.   Difficult to place patient No   Consultants:  Urology  Procedures: As above  Antimicrobials: Now on Augmentin   Subjective:  Worked with therapy, walked 22 feet, no chest pain doing fair no distress Asking if he can go home tomorrow Objective: Vitals:   12/02/20 1440 12/02/20 2045 12/03/20 0518 12/03/20 1205  BP: (!) 166/80 (!) 157/58 (!) 132/54 (!) 165/74  Pulse: 88 75 87 91  Resp: '14 20 20 20  '$ Temp: 98.2 F (36.8 C) (!) 97.5 F (36.4 C) 98.7 F (37.1 C) 98.2 F (36.8 C)  TempSrc: Oral Oral    SpO2: 95% 90% 93% 92%  Weight:      Height:        Intake/Output Summary (Last 24 hours) at 12/03/2020 1215 Last data filed at 12/03/2020 0813 Gross per 24 hour  Intake 890 ml  Output 900 ml  Net -10 ml    Filed Weights   11/26/20 2106  Weight: (!) 190.5 kg    Examination:  No real change to exam  Poor dentition, flat affect Mallampati 4 S1-S2 no murmur Abdomen obese distended with epigastric hernia Tympanitic abdomen Lower extremity not examined today   Data Reviewed: personally reviewed   CBC    Component Value Date/Time   WBC 11.7 (H) 12/03/2020 0416   RBC 4.81 12/03/2020 0416   HGB 13.4 12/03/2020 0416   HGB 13.9 10/09/2017 0757   HGB 13.8 01/09/2017 0843  HGB 13.3 09/14/2013 1027   HCT 40.9 12/03/2020 0416   HCT 40.2 01/09/2017 0843   HCT 40.3 09/14/2013 1027   PLT 307 12/03/2020 0416   PLT 249 10/09/2017 0757   PLT 267 01/09/2017 0843   PLT 255 09/14/2013 1027   MCV 85.0 12/03/2020 0416   MCV 85 01/09/2017 0843   MCV 80.5 09/14/2013 1027   MCH 27.9 12/03/2020 0416   MCHC 32.8 12/03/2020 0416   RDW 13.6 12/03/2020 0416   RDW 13.9  01/09/2017 0843   RDW 14.8 (H) 09/14/2013 1027   LYMPHSABS 1.9 12/03/2020 0416   LYMPHSABS 2.0 01/09/2017 0843   LYMPHSABS 1.5 09/14/2013 1027   MONOABS 0.6 12/03/2020 0416   MONOABS 0.5 09/14/2013 1027   EOSABS 0.2 12/03/2020 0416   EOSABS 0.3 01/09/2017 0843   BASOSABS 0.1 12/03/2020 0416   BASOSABS 0.0 01/09/2017 0843   BASOSABS 0.1 09/14/2013 1027   CMP Latest Ref Rng & Units 12/03/2020 12/01/2020 11/30/2020  Glucose 70 - 99 mg/dL 191(H) 194(H) 211(H)  BUN 8 - 23 mg/dL '19 15 16  '$ Creatinine 0.61 - 1.24 mg/dL 0.76 0.80 0.82  Sodium 135 - 145 mmol/L 135 137 138  Potassium 3.5 - 5.1 mmol/L 3.2(L) 3.5 4.0  Chloride 98 - 111 mmol/L 99 101 104  CO2 22 - 32 mmol/L '30 27 27  '$ Calcium 8.9 - 10.3 mg/dL 8.4(L) 8.7(L) 8.8(L)  Total Protein 6.5 - 8.1 g/dL 5.8(L) 5.7(L) 5.8(L)  Total Bilirubin 0.3 - 1.2 mg/dL 0.7 0.7 0.6  Alkaline Phos 38 - 126 U/L 69 60 66  AST 15 - 41 U/L 61(H) 79(H) 96(H)  ALT 0 - 44 U/L 71(H) 61(H) 61(H)     Radiology Studies: No results found.   Scheduled Meds:  amoxicillin-clavulanate  1 tablet Oral Q12H   carvedilol  6.25 mg Oral BID WC   Chlorhexidine Gluconate Cloth  6 each Topical Daily   DULoxetine  30 mg Oral Daily   heparin injection (subcutaneous)  5,000 Units Subcutaneous Q8H   insulin aspart  0-15 Units Subcutaneous Q4H   insulin detemir  20 Units Subcutaneous BID   ketorolac  10 mg Oral Q8H   pantoprazole  40 mg Oral Daily   potassium chloride  40 mEq Oral Daily   pregabalin  100 mg Oral TID   senna-docusate  1 tablet Oral BID   tamsulosin  0.4 mg Oral QPC supper   Continuous Infusions:   LOS: 6 days   Time spent: 9  Nita Sells, MD [facility] To contact the attending provider between 7A-7P or the covering provider during after hours 7P-7A, please log into the web site [email or web address] and access using universal  password for that web site. If you do not have the password, please call the hospital  operator.  12/03/2020, 12:15 PM

## 2020-12-04 DIAGNOSIS — N179 Acute kidney failure, unspecified: Secondary | ICD-10-CM

## 2020-12-04 DIAGNOSIS — M6282 Rhabdomyolysis: Secondary | ICD-10-CM

## 2020-12-04 LAB — BASIC METABOLIC PANEL
Anion gap: 8 (ref 5–15)
BUN: 16 mg/dL (ref 8–23)
CO2: 30 mmol/L (ref 22–32)
Calcium: 8.5 mg/dL — ABNORMAL LOW (ref 8.9–10.3)
Chloride: 102 mmol/L (ref 98–111)
Creatinine, Ser: 0.82 mg/dL (ref 0.61–1.24)
GFR, Estimated: >60 mL/min (ref >60)
Glucose, Bld: 194 mg/dL — ABNORMAL HIGH (ref 70–99)
Potassium: 3.4 mmol/L — ABNORMAL LOW (ref 3.5–5.1)
Sodium: 140 mmol/L (ref 135–145)

## 2020-12-04 LAB — GLUCOSE, CAPILLARY
Glucose-Capillary: 178 mg/dL — ABNORMAL HIGH (ref 70–99)
Glucose-Capillary: 194 mg/dL — ABNORMAL HIGH (ref 70–99)
Glucose-Capillary: 203 mg/dL — ABNORMAL HIGH (ref 70–99)
Glucose-Capillary: 213 mg/dL — ABNORMAL HIGH (ref 70–99)

## 2020-12-04 LAB — CK: Total CK: 176 U/L (ref 49–397)

## 2020-12-04 MED ORDER — TAMSULOSIN HCL 0.4 MG PO CAPS: 0.4000 mg | ORAL_CAPSULE | Freq: Every day | ORAL | 0 refills | Status: AC

## 2020-12-04 MED ORDER — INSULIN DETEMIR 100 UNIT/ML ~~LOC~~ SOLN
25.0000 [IU] | Freq: Two times a day (BID) | SUBCUTANEOUS | Status: DC
  Administered 2020-12-04 (×2): 25 [IU] via SUBCUTANEOUS
  Filled 2020-12-04 (×3): qty 0.25

## 2020-12-04 MED ORDER — GLIPIZIDE 10 MG PO TABS: 10.0000 mg | ORAL_TABLET | Freq: Two times a day (BID) | ORAL | 11 refills | Status: DC

## 2020-12-04 MED ORDER — AMOXICILLIN-POT CLAVULANATE 875-125 MG PO TABS: 1.0000 | ORAL_TABLET | Freq: Two times a day (BID) | ORAL | 0 refills | Status: DC

## 2020-12-04 NOTE — Discharge Summary (Signed)
Physician Discharge Summary  Brandon Schaefer [id number] DOB: 11-Sep-1955 DOA: 11/26/2020  PCP: Binnie Rail, MD  Admit date: 11/26/2020 Discharge date: 12/04/2020 30 Day Unplanned Readmission Risk Score    Flowsheet Row ED to Hosp-Admission (Current) from 11/26/2020 in [facility]  30 Day Unplanned Readmission Risk Score (%) 11.25 Filed at 12/04/2020 1200       This score is the patient's risk of an unplanned readmission within 30 days of being discharged (0 -100%). The score is based on dignosis, age, lab data, medications, orders, and past utilization.   Low:  0-14.9   Medium: 15-21.9   High: 22-29.9   Extreme: 30 and above          Admitted From: Home Disposition: Home  Recommendations for Outpatient Follow-up:  Follow up with PCP in 1-2 weeks Please obtain BMP/CBC in one week Follow-up with urology in 1 week Please follow up with your PCP on the following pending results: Unresulted Labs (From admission, onward)    None         Home Health: Yes Equipment/Devices: None  Discharge Condition: Stable CODE STATUS: Full code Diet recommendation: Cardiac and diabetic  Subjective: Seen and examined.  No complaints other than generalized weakness.    Brief/Interim Summary: Brandon Schaefer is a 65 y.o. male with history of diabetes mellitus type 2, hypertension, hyperlipidemia, morbid obesity with BMI of 56 and Sigmoid colon cancer-partial colectomy 2013, L+ LR nephrolithiasis multiple urinary stones-nephrostolithotomy bilaterally 2014 presented to ED with chills, rigors and fall, no loss of consciousness.  Upon arrival to ED, his white cells were 25, creatinine 1.3.  Found to have frank hematuria.  Also had chronic skin changes in lower extremities.  Patient was admitted due to sepsis secondary to UTI.  Also had AKI secondary to rhabdomyolysis.  Hypokalemia.  Patient was treated with IV fluids.  Urology was consulted and cystoscopy was  done on 11/27/2020.  ACE inhibitor's and nephrotoxic agents were all held.  With IV fluids, his AKI resolved.  He was on broad-spectrum IV antibiotics which were transitioned to oral Augmentin.  Patient CK improved.  Patient overall improved as well.  He was assessed by PT OT who determined that patient is back at his baseline and did not recommend any further PT.  When seen this morning.  He was still complaining of severe weakness.  He lives alone.  He appeared to be weak as well.  Per my request, he was reassessed by PT and this time the updated their recommendation but is still recommended to send home but now with home health PT which I will order and then I will discharge this patient home today.  He will continue 3 more days of oral Augmentin and will follow up with urology.  Discharge Diagnoses:  Principal Problem:   Sepsis ([place]) Active Problems:   DM (diabetes mellitus) ([place])   HTN (hypertension)   B12 deficiency   Rhabdomyolysis   AKI (acute kidney injury) ([place])    Discharge Instructions   Allergies as of 12/04/2020       Reactions   Dover Elastic Foam Strap [attends Briefs Small] Hives   **Elastic in socks    Adhesive [tape] Rash        Medication List     TAKE these medications    amoxicillin-clavulanate 875-125 MG tablet Commonly known as: AUGMENTIN Take 1 tablet by mouth every 12 (twelve) hours for 3 days.   aspirin EC 81 MG tablet  Take 81 mg by mouth daily.   carvedilol 6.25 MG tablet Commonly known as: COREG TAKE 1 TABLET(6.25 MG) BY MOUTH TWICE DAILY WITH A MEAL What changed: See the new instructions.   Contour Next Test test strip Generic drug: glucose blood USE TO CHECK BLOOD SUGAR THREE TIMES DAILY AS DIRECTED   DULoxetine 30 MG capsule Commonly known as: CYMBALTA TAKE 1 CAPSULE BY MOUTH DAILY What changed: additional instructions   FreeStyle Libre 14 Day Sensor Misc Use as directed to check sugars  E11.65   FreeStyle Libre 2 Reader Athalia Use  as directed to check sugars   E11.65   glipiZIDE 10 MG tablet Commonly known as: GLUCOTROL Take 1 tablet (10 mg total) by mouth 2 (two) times daily.   HumaLOG Mix 75/25 (75-25) 100 UNIT/ML Susp injection Generic drug: insulin lispro protamine-lispro INJECT 45 UNITS UNDER THE SKIN EVERY MORNING AND 40 UNITS AT NIGHT What changed:  how much to take how to take this when to take this   INSULIN SYRINGE 1CC/31GX5/16" 31G X 5/16" 1 ML Misc USE AS DIRECTED TWICE DAILY FOR INSULIN   meloxicam 15 MG tablet Commonly known as: MOBIC TAKE 1 TABLET(15 MG) BY MOUTH DAILY What changed: See the new instructions.   metFORMIN 1000 MG tablet Commonly known as: GLUCOPHAGE TAKE 1 TABLET(1000 MG) BY MOUTH TWICE DAILY WITH A MEAL What changed: See the new instructions.   pregabalin 100 MG capsule Commonly known as: LYRICA TAKE 1 CAPSULE(100 MG) BY MOUTH THREE TIMES DAILY What changed: See the new instructions.   ramipril 10 MG capsule Commonly known as: ALTACE TAKE 1 CAPSULE(10 MG) BY MOUTH DAILY BEFORE BREAKFAST What changed:  how much to take how to take this when to take this   rosuvastatin 20 MG tablet Commonly known as: CRESTOR TAKE 1 TABLET(20 MG) BY MOUTH DAILY BEFORE BREAKFAST What changed: See the new instructions.   Rybelsus 7 MG Tabs Generic drug: Semaglutide Take 7 mg by mouth daily.   tamsulosin 0.4 MG Caps capsule Commonly known as: FLOMAX Take 1 capsule (0.4 mg total) by mouth daily after supper.   triamcinolone ointment 0.1 % Commonly known as: KENALOG APPLY TOPICALLY TO LEGS TWICE DAILY FOR 2 WEEKS        Follow-up Information     Binnie Rail, MD Follow up in 1 week(s).   Specialty: Internal Medicine Contact information: [place] [address] [phone]                Allergies  Allergen Reactions   Dover Elastic Foam Strap [Attends Briefs Small] Hives    **Elastic in socks    Adhesive [Tape] Rash    Consultations:  Urology   Procedures/Studies: DG Knee 1-2 Views Left  Result Date: 11/27/2020 CLINICAL DATA:  Fall x2 days, pain EXAM: LEFT KNEE - 1-2 VIEW COMPARISON:  None. FINDINGS: Limited evaluation due to obliquity/difficulty with patient positioning. No fracture or dislocation is seen. Moderate tricompartmental degenerative changes, most prominent in the medial compartment. Visualized soft tissues are within normal limits. No suprapatellar knee joint effusion. IMPRESSION: No fracture or dislocation is seen. Moderate tricompartmental degenerative changes. Electronically Signed   By: Julian Hy M.D.   On: 11/27/2020 03:49   DG Knee 1-2 Views Right  Result Date: 11/27/2020 CLINICAL DATA:  Fall x2 days, pain EXAM: RIGHT KNEE - 1-2 VIEW COMPARISON:  None. FINDINGS: Lateral view is limited by obliquity. No fracture or dislocation is seen. Moderate tricompartmental degenerative changes, most prominent  in the medial compartment. Lateral compartment chondrocalcinosis. No definite suprapatellar knee joint effusion. IMPRESSION: No fracture or dislocation is seen. Moderate tricompartmental degenerative changes. Electronically Signed   By: Julian Hy M.D.   On: 11/27/2020 03:50   CT HEAD WO CONTRAST (5MM)  Result Date: 11/27/2020 CLINICAL DATA:  Fall. EXAM: CT HEAD WITHOUT CONTRAST TECHNIQUE: Contiguous axial images were obtained from the base of the skull through the vertex without intravenous contrast. COMPARISON:  None. FINDINGS: Brain: No evidence of acute infarction, hemorrhage, hydrocephalus, extra-axial collection or mass lesion/mass effect. Mild generalized cerebral atrophy. Vascular: Atherosclerotic vascular calcification of the carotid siphons. No hyperdense vessel. Skull: Normal. Negative for fracture or focal lesion. Sinuses/Orbits: No acute finding. Other: None. IMPRESSION: 1. No acute intracranial abnormality. Electronically Signed   By: Titus Dubin M.D.   On: 11/27/2020 08:26   DG Chest  Port 1 View  Result Date: 11/27/2020 CLINICAL DATA:  Fall EXAM: PORTABLE CHEST 1 VIEW COMPARISON:  11/25/2015 FINDINGS: Lungs are clear.  No pleural effusion or pneumothorax. The heart is top-normal in size. IMPRESSION: No evidence of acute cardiopulmonary disease. Electronically Signed   By: Julian Hy M.D.   On: 11/27/2020 03:51   DG Hand Complete Left  Result Date: 11/27/2020 CLINICAL DATA:  Fall x2 days, generalized pain EXAM: LEFT HAND - COMPLETE 3+ VIEW COMPARISON:  None. FINDINGS: No fracture or dislocation is seen. Prior ORIF of the distal radius, incompletely visualized. The joint spaces are preserved. Visualized soft tissues are within normal limits. IMPRESSION: Negative. Electronically Signed   By: Julian Hy M.D.   On: 11/27/2020 03:51   DG Hand Complete Right  Result Date: 11/27/2020 CLINICAL DATA:  Fall x2 days, generalized pain EXAM: RIGHT HAND - COMPLETE 3+ VIEW COMPARISON:  None. FINDINGS: No fracture or dislocation is seen. The joint spaces are preserved. Visualized soft tissues are within normal limits. IMPRESSION: Negative. Electronically Signed   By: Julian Hy M.D.   On: 11/27/2020 03:50   DG C-Arm 1-60 Min-No Report  Result Date: 11/27/2020 Fluoroscopy was utilized by the requesting physician.  No radiographic interpretation.   CT RENAL STONE STUDY  Result Date: 11/27/2020 CLINICAL DATA:  Hematuria, unknown cause EXAM: CT ABDOMEN AND PELVIS WITHOUT CONTRAST TECHNIQUE: Multidetector CT imaging of the abdomen and pelvis was performed following the standard protocol without IV contrast. COMPARISON:  CT abdomen/pelvis 09/14/2013 FINDINGS: Lower chest: There is dependent subsegmental atelectasis in the lung bases. There is a 7 mm nodule in the right lower lobe an additional 7 mm nodule in the left base. Both of these nodules were present in 2015 but have minimally increased in size. The imaged heart is unremarkable. There is no pleural effusion. Hepatobiliary:  The liver is unremarkable. Foci of air within the gallbladder are likely due to gallstones. There is no evidence of acute cholecystitis. There is no biliary ductal dilatation. Pancreas: Normal. Spleen: Normal. Adrenals/Urinary Tract: The adrenals are unremarkable. 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 Multiple additional hypodense lesions in the kidneys likely reflects cysts. There are multiple renal stones bilaterally measuring up to 1.7 cm on the left. There also two calculi in the left ureter measuring up to 5 mm distally. There is fullness of the left collecting system without frank hydronephrosis or hydroureter. An additional stone is noted within the left collecting system measuring 6 mm. No stones are seen along the course of the right ureter. Layering hyperdensity in the bladder may reflect additional passed stones. The  bladder is otherwise unremarkable. Stomach/Bowel: Postsurgical changes reflecting partial large bowel resection again seen. There is no evidence of local recurrence or complication at the anastomotic site. The stomach is unremarkable. There is no evidence of bowel obstruction. There is no abnormal bowel wall thickening or inflammatory change. Vascular/Lymphatic: There is minimal calcification in the bilateral common iliac arteries and thoracic aorta. There is no abdominal aortic aneurysm. A prominent 1.4 cm portacaval lymph node is unchanged since 2015. A 1.9 cm lymph node along the right iliac chain has slightly enlarged since 2015, nonspecific. A 1.2 cm left iliac chain lymph nodes is unchanged. Reproductive: The prostate and seminal vesicles are unremarkable. Other: There is no ascites or free air. Musculoskeletal: There is grade 1 anterolisthesis of L4 on L5, slightly increased since 2015. There is multilevel degenerative change of the lumbar spine with advanced facet arthropathy at L4-L5 and L5-S1. There is no acute osseous abnormality or aggressive  osseous lesion. IMPRESSION: 1. Two left ureteral stones measuring up to 5 mm as above and an additional 5 mm stone within the left collecting system; there is mild fullness of the left collecting system without frank hydronephrosis or hydroureter. 2. Multiple additional nonobstructing renal stones bilaterally measuring up to 1.7 cm on the left as above. 3. Cholelithiasis without evidence of acute cholecystitis. 4. 65m pulmonary nodules in both lower lobes described above. These nodules have been present since 2015 with minimal interval increase in size. Given minimal increase over 7 years, these nodules are favored to be benign. Recommend continued attention on follow-up studies. Electronically Signed   By: PValetta MoleM.D.   On: 11/27/2020 08:31     Discharge Exam: Vitals:   12/04/20 0525 12/04/20 1245  BP: (!) 138/55 (!) 166/72  Pulse: (!) 115 (!) 102  Resp: 20 16  Temp: 97.9 F (36.6 C) 98.2 F (36.8 C)  SpO2: 91% 93%   Vitals:   12/03/20 1205 12/03/20 1953 12/04/20 0525 12/04/20 1245  BP: (!) 165/74 (!) 153/52 (!) 138/55 (!) 166/72  Pulse: 91 82 (!) 115 (!) 102  Resp: '20 18 20 16  '$ Temp: 98.2 F (36.8 C) 98.4 F (36.9 C) 97.9 F (36.6 C) 98.2 F (36.8 C)  TempSrc:  Oral Oral Oral  SpO2: 92% 92% 91% 93%  Weight:      Height:        General: Pt is alert, awake, not in acute distress Cardiovascular: RRR, S1/S2 +, no rubs, no gallops Respiratory: CTA bilaterally, no wheezing, no rhonchi Abdominal: Soft, NT, ND, bowel sounds + Extremities: no edema, no cyanosis    The results of significant diagnostics from this hospitalization (including imaging, microbiology, ancillary and laboratory) are listed below for reference.     Microbiology: Recent Results (from the past 240 hour(s))  Blood culture (routine x 2)     Status: None   Collection Time: 11/26/20 11:10 PM   Specimen: BLOOD  Result Value Ref Range Status   Specimen Description   Final    BLOOD BLOOD LEFT  FOREARM Performed at [facility] [place] [address]   Special Requests   Final    BOTTLES DRAWN AEROBIC AND ANAEROBIC Blood Culture adequate volume Performed at [facility], [place] [address]   Culture   Final    NO GROWTH 5 DAYS Performed at [facility] [address] [place] [address]   Report Status 12/02/2020 FINAL  Final  Urine Culture     Status: Abnormal   Collection Time: 11/27/20 12:51 AM   Specimen: Urine, Clean Catch  Result Value Ref Range Status   Specimen Description   Final    URINE, CLEAN CATCH Performed at [facility], [place] [address] [place], [place] [address]    Special Requests   Final    NONE Performed at [facility], [place] [address] [place], [place] [address]    Culture (A)  Final    <10,000 COLONIES/mL INSIGNIFICANT GROWTH Performed at [facility] [address], [place], [place] [address]    Report Status 11/28/2020 FINAL  Final  Blood culture (routine x 2)     Status: None   Collection Time: 11/27/20  1:08 AM   Specimen: BLOOD  Result Value Ref Range Status   Specimen Description   Final    BLOOD BLOOD LEFT FOREARM Performed at [facility] [address], [place], [place] [address]    Special Requests   Final    BOTTLES DRAWN AEROBIC AND ANAEROBIC Blood Culture adequate volume Performed at [facility] [address], [place], [place] [address]    Culture   Final    NO GROWTH 5 DAYS Performed at [facility], [place] [address], [place], [place] [address]    Report Status 12/02/2020 FINAL  Final  Resp Panel by RT-PCR (Flu A&B, Covid) Nasopharyngeal Swab     Status: None   Collection Time: 11/27/20  3:28 AM   Specimen: Nasopharyngeal Swab; Nasopharyngeal(NP) swabs in vial transport medium  Result Value Ref Range Status   SARS Coronavirus 2 by RT  PCR NEGATIVE NEGATIVE Final    Comment: (NOTE) SARS-CoV-2 target nucleic acids are NOT DETECTED.  The SARS-CoV-2 RNA is generally detectable in upper respiratory specimens during the acute phase of infection. The lowest concentration of SARS-CoV-2 viral copies this assay can detect is 138 copies/mL. A negative result does not preclude SARS-Cov-2 infection and should not be used as the sole basis for treatment or other patient management decisions. A negative result may occur with  improper specimen collection/handling, submission of specimen other than nasopharyngeal swab, presence of viral mutation(s) within the areas targeted by this assay, and inadequate number of viral copies(<138 copies/mL). A negative result must be combined with clinical observations, patient history, and epidemiological information. The expected result is Negative.  Fact Sheet for Patients:  EntrepreneurPulse.com.au  Fact Sheet for Healthcare Providers:  IncredibleEmployment.be  This test is no t yet approved or cleared by the [place] FDA and  has been authorized for detection and/or diagnosis of SARS-CoV-2 by FDA under an Emergency Use Authorization (EUA). This EUA will remain  in effect (meaning this test can be used) for the duration of the COVID-19 declaration under Section 564(b)(1) of the Act, 21 U.S.C.section 360bbb-3(b)(1), unless the authorization is terminated  or revoked sooner.       Influenza A by PCR NEGATIVE NEGATIVE Final   Influenza B by PCR NEGATIVE NEGATIVE Final    Comment: (NOTE) The Xpert Xpress SARS-CoV-2/FLU/RSV plus assay is intended as an aid in the diagnosis of influenza from Nasopharyngeal swab specimens and should not be used as a sole basis for treatment. Nasal washings and aspirates are unacceptable for Xpert Xpress SARS-CoV-2/FLU/RSV testing.  Fact Sheet for Patients: EntrepreneurPulse.com.au  Fact Sheet for  Healthcare Providers: IncredibleEmployment.be  This test is not yet approved or cleared by the [place] FDA  and has been authorized for detection and/or diagnosis of SARS-CoV-2 by FDA under an Emergency Use Authorization (EUA). This EUA will remain in effect (meaning this test can be used) for the duration of the COVID-19 declaration under Section 564(b)(1) of the Act, 21 U.S.C. section 360bbb-3(b)(1), unless the authorization is terminated or revoked.  Performed at [facility], [place] [address] [place], [place] [address]      Labs: BNP (last 3 results) No results for input(s): BNP in the last 8760 hours. Basic Metabolic Panel: Recent Labs  Lab 11/29/20 0455 11/30/20 0636 12/01/20 0509 12/03/20 0416 12/04/20 0528  NA 139 138 137 135 140  K 4.1 4.0 3.5 3.2* 3.4*  CL 105 104 101 99 102  CO2 '26 27 27 30 30  '$ GLUCOSE 221* 211* 194* 191* 194*  BUN '17 16 15 19 16  '$ CREATININE 0.87 0.82 0.80 0.76 0.82  CALCIUM 8.7* 8.8* 8.7* 8.4* 8.5*   Liver Function Tests: Recent Labs  Lab 11/30/20 0636 12/01/20 0509 12/03/20 0416  AST 96* 79* 61*  ALT 61* 61* 71*  ALKPHOS 66 60 69  BILITOT 0.6 0.7 0.7  PROT 5.8* 5.7* 5.8*  ALBUMIN 2.8* 2.8* 2.9*   No results for input(s): LIPASE, AMYLASE in the last 168 hours. No results for input(s): AMMONIA in the last 168 hours. CBC: Recent Labs  Lab 11/28/20 0211 11/29/20 0455 11/30/20 0636 12/01/20 0509 12/03/20 0416  WBC 15.6* 9.7 8.4 8.7 11.7*  NEUTROABS  --   --   --  5.4 8.2*  HGB 12.6* 12.3* 12.7* 13.2 13.4  HCT 38.4* 36.9* 39.0 40.6 40.9  MCV 86.5 85.0 85.0 84.8 85.0  PLT 179 208 223 264 307   Cardiac Enzymes: Recent Labs  Lab 11/28/20 0211 11/29/20 0455 11/30/20 0636 12/04/20 0528  CKTOTAL 6,109* 3,646* 2,304* 176   BNP: Invalid input(s): POCBNP CBG: Recent Labs  Lab 12/03/20 1622 12/03/20 1954 12/04/20 0005 12/04/20 0419 12/04/20 1123  GLUCAP 168* 234* 213* 178* 194*    D-Dimer No results for input(s): DDIMER in the last 72 hours. Hgb A1c No results for input(s): HGBA1C in the last 72 hours. Lipid Profile No results for input(s): CHOL, HDL, LDLCALC, TRIG, CHOLHDL, LDLDIRECT in the last 72 hours. Thyroid function studies No results for input(s): TSH, T4TOTAL, T3FREE, THYROIDAB in the last 72 hours.  Invalid input(s): FREET3 Anemia work up No results for input(s): VITAMINB12, FOLATE, FERRITIN, TIBC, IRON, RETICCTPCT in the last 72 hours. Urinalysis    Component Value Date/Time   COLORURINE RED (A) 11/27/2020 0051   APPEARANCEUR CLOUDY (A) 11/27/2020 0051   LABSPEC 1.018 11/27/2020 0051   PHURINE 6.0 11/27/2020 0051   GLUCOSEU >=500 (A) 11/27/2020 0051   GLUCOSEU 250 (A) 05/12/2020 0831   HGBUR MODERATE (A) 11/27/2020 0051   BILIRUBINUR NEGATIVE 11/27/2020 0051   KETONESUR NEGATIVE 11/27/2020 0051   PROTEINUR 100 (A) 11/27/2020 0051   UROBILINOGEN 0.2 05/12/2020 0831   NITRITE NEGATIVE 11/27/2020 0051   LEUKOCYTESUR NEGATIVE 11/27/2020 0051   Sepsis Labs Invalid input(s): PROCALCITONIN,  WBC,  LACTICIDVEN Microbiology Recent Results (from the past 240 hour(s))  Blood culture (routine x 2)     Status: None   Collection Time: 11/26/20 11:10 PM   Specimen: BLOOD  Result Value Ref Range Status   Specimen Description   Final    BLOOD BLOOD LEFT FOREARM Performed at [facility], [place] [address], [place], [place] [address]    Special Requests   Final  BOTTLES DRAWN AEROBIC AND ANAEROBIC Blood Culture adequate volume Performed at [facility] [address], [place], [place] [address]    Culture   Final    NO GROWTH 5 DAYS Performed at [facility], [place] [address] [place], [place] [address]    Report Status 12/02/2020 FINAL  Final  Urine Culture     Status: Abnormal   Collection Time: 11/27/20 12:51 AM   Specimen: Urine, Clean Catch  Result Value Ref Range Status   Specimen Description    Final    URINE, CLEAN CATCH Performed at [facility], [place] [address], [place], [place] [address]    Special Requests   Final    NONE Performed at [facility], [place] [address] [place], [place] [address]    Culture (A)  Final    <10,000 COLONIES/mL INSIGNIFICANT GROWTH Performed at [facility] [address], [place], [place] [address]    Report Status 11/28/2020 FINAL  Final  Blood culture (routine x 2)     Status: None   Collection Time: 11/27/20  1:08 AM   Specimen: BLOOD  Result Value Ref Range Status   Specimen Description   Final    BLOOD BLOOD LEFT FOREARM Performed at [facility] [address], [place], [place] [address]    Special Requests   Final    BOTTLES DRAWN AEROBIC AND ANAEROBIC Blood Culture adequate volume Performed at [facility] [address], [place], [place] [address]    Culture   Final    NO GROWTH 5 DAYS Performed at [facility], [place] [address] [place], [place] [address]    Report Status 12/02/2020 FINAL  Final  Resp Panel by RT-PCR (Flu A&B, Covid) Nasopharyngeal Swab     Status: None   Collection Time: 11/27/20  3:28 AM   Specimen: Nasopharyngeal Swab; Nasopharyngeal(NP) swabs in vial transport medium  Result Value Ref Range Status   SARS Coronavirus 2 by RT PCR NEGATIVE NEGATIVE Final    Comment: (NOTE) SARS-CoV-2 target nucleic acids are NOT DETECTED.  The SARS-CoV-2 RNA is generally detectable in upper respiratory specimens during the acute phase of infection. The lowest concentration of SARS-CoV-2 viral copies this assay can detect is 138 copies/mL. A negative result does not preclude SARS-Cov-2 infection and should not be used as the sole basis for treatment or other patient management decisions. A negative result may occur with  improper specimen collection/handling, submission of specimen other than nasopharyngeal swab, presence of viral  mutation(s) within the areas targeted by this assay, and inadequate number of viral copies(<138 copies/mL). A negative result must be combined with clinical observations, patient history, and epidemiological information. The expected result is Negative.  Fact Sheet for Patients:  EntrepreneurPulse.com.au  Fact Sheet for Healthcare Providers:  IncredibleEmployment.be  This test is no t yet approved or cleared by the [place] FDA and  has been authorized for detection and/or diagnosis of SARS-CoV-2 by FDA under an Emergency Use Authorization (EUA). This EUA will remain  in effect (meaning this test can be used) for the duration of the COVID-19 declaration under Section 564(b)(1) of the Act, 21 U.S.C.section 360bbb-3(b)(1), unless the authorization is terminated  or revoked sooner.       Influenza A by PCR NEGATIVE NEGATIVE Final   Influenza B by PCR NEGATIVE NEGATIVE Final    Comment: (NOTE) The Xpert Xpress SARS-CoV-2/FLU/RSV plus assay is intended as an aid in  the diagnosis of influenza from Nasopharyngeal swab specimens and should not be used as a sole basis for treatment. Nasal washings and aspirates are unacceptable for Xpert Xpress SARS-CoV-2/FLU/RSV testing.  Fact Sheet for Patients: EntrepreneurPulse.com.au  Fact Sheet for Healthcare Providers: IncredibleEmployment.be  This test is not yet approved or cleared by the [place] FDA and has been authorized for detection and/or diagnosis of SARS-CoV-2 by FDA under an Emergency Use Authorization (EUA). This EUA will remain in effect (meaning this test can be used) for the duration of the COVID-19 declaration under Section 564(b)(1) of the Act, 21 U.S.C. section 360bbb-3(b)(1), unless the authorization is terminated or revoked.  Performed at [facility], [place] [address], [place], [place] [address]      Time coordinating  discharge: Over 30 minutes  SIGNED:   Darliss Cheney, MD  [facility] 12/04/2020, 1:05 PM  If 7PM-7AM, please contact night-coverage [email or web address]

## 2020-12-04 NOTE — Progress Notes (Signed)
I was informed that patient's brother was upset about the decision to send patient home and he believes that patient is too weak to go home and lives alone and will not be able to take care of himself.  Discussed with the brother.  He is convinced that patient is medically stable for discharge however he is not convinced that patient should go home.  I asked PT to talk to the brother and explained the rationale behind the recommendations and eventually PT updated their recommendations and now recommend SNF.  TOC is already in the loop.  We will cancel discharge and discharge to SNF once bed is available.

## 2020-12-04 NOTE — NC FL2 (Signed)
[place] LEVEL OF CARE SCREENING TOOL     IDENTIFICATION  Patient Name: Brandon Schaefer Birthdate: 1955-09-20 Sex: male Admission Date (Current Location): 11/26/2020  [facility] and [place] Number:  Herbalist and Address:  [facility],  [place], [place]      Provider Number: [id number]  Attending Physician Name and Address:  Darliss Cheney, MD  Relative Name and Phone Number:  [name] brother [phone]    Current Level of Care: Hospital Recommended Level of Care: [place] Prior Approval Number:    Date Approved/Denied:   PASRR Number:    Discharge Plan: SNF    Current Diagnoses: Patient Active Problem List   Diagnosis Date Noted   Rhabdomyolysis 12/04/2020   AKI (acute kidney injury) ([place]) 12/04/2020   Sepsis ([place]) 11/27/2020   BMI 50.0-59.9, adult ([place]) 02/10/2020   History of nephrolithiasis 11/10/2019   B12 deficiency 05/11/2019   Tremor 01/27/2018   Right medial malleolar fracture 11/26/2015   Anxiety 11/26/2015   Hyperlipidemia 11/26/2015   Bilateral leg edema 08/22/2015   Back pain 03/20/2015   Anemia, iron deficiency 05/05/2012   DM (diabetes mellitus) ([place]) 01/18/2012   HTN (hypertension) 01/18/2012   Morbid obesity ([place]) 01/17/2012   Cancer of sigmoid colon ([place]) 12/23/2011   Arthritis of knee, degenerative 08/09/2011   Toxic neuropathy ([place]) 01/01/2006    Orientation RESPIRATION BLADDER Height & Weight     Self, Time, Situation  Normal Continent Weight: (!) 190.5 kg Height:  6' (182.9 cm)  BEHAVIORAL SYMPTOMS/MOOD NEUROLOGICAL BOWEL NUTRITION STATUS      Continent Diet (Heart healthy)  AMBULATORY STATUS COMMUNICATION OF NEEDS Skin   Limited Assist Verbally Normal                       Personal Care Assistance Level of Assistance  Bathing, Feeding, Dressing Bathing Assistance: Limited assistance Feeding assistance: Limited assistance Dressing Assistance: Limited  assistance     Functional Limitations Info  Sight, Speech, Hearing Sight Info: Impaired (reading eyeglasses) Hearing Info: Adequate Speech Info: Adequate    SPECIAL CARE FACTORS FREQUENCY  PT (By licensed PT), OT (By licensed OT)     PT Frequency:  (5x week) OT Frequency:  (5x week)            Contractures Contractures Info: Not present    Additional Factors Info  Code Status, Allergies Code Status Info:  (full) Allergies Info:  (dover elastic foam, adhesive)           Current Medications (12/04/2020):  This is the current hospital active medication list Current Facility-Administered Medications  Medication Dose Route Frequency Provider Last Rate Last Admin   acetaminophen (TYLENOL) tablet 650 mg  650 mg Oral Q6H PRN Ceasar Mons, MD   650 mg at 11/29/20 [id number]   Or   acetaminophen (TYLENOL) suppository 650 mg  650 mg Rectal Q6H PRN Ceasar Mons, MD       alum & mag hydroxide-simeth (MAALOX/MYLANTA) 200-200-20 MG/5ML suspension 30 mL  30 mL Oral Q6H PRN Kc, Maren [place], MD   30 mL at 11/29/20 0617   amoxicillin-clavulanate (AUGMENTIN) 875-125 MG per tablet 1 tablet  1 tablet Oral Q12H Nita Sells, MD   1 tablet at 12/04/20 0837   carvedilol (COREG) tablet 6.25 mg  6.25 mg Oral BID WC Kc, Maren [place], MD   6.25 mg at 12/03/20 1810   Chlorhexidine Gluconate Cloth 2 % PADS 6 each  6 each Topical Daily Rise Patience, MD   6 each at 12/04/20 [id number]   DULoxetine (CYMBALTA) DR capsule 30 mg  30 mg Oral Daily Ceasar Mons, MD   30 mg at 12/04/20 [id number]   heparin injection 5,000 Units  5,000 Units Subcutaneous Q8H Kc, Maren [place], MD   5,000 Units at 12/04/20 1249   HYDROcodone-acetaminophen (NORCO/VICODIN) 5-325 MG per tablet 1 tablet  1 tablet Oral Q4H PRN Alexis Frock, MD   1 tablet at 12/04/20 1250   insulin aspart (novoLOG) injection 0-15 Units  0-15 Units Subcutaneous Q4H Ceasar Mons, MD   3 Units at 12/04/20 1249   insulin  detemir (LEVEMIR) injection 25 Units  25 Units Subcutaneous BID Darliss Cheney, MD   25 Units at 12/04/20 1249   ketorolac (TORADOL) tablet 10 mg  10 mg Oral Q8H Alexis Frock, MD   10 mg at 12/04/20 1249   ondansetron (ZOFRAN) injection 4 mg  4 mg Intravenous Q8H PRN Blount, Scarlette Shorts T, NP   4 mg at 12/04/20 1257   pantoprazole (PROTONIX) EC tablet 40 mg  40 mg Oral Daily Kc, Ramesh, MD   40 mg at 12/04/20 0836   potassium chloride SA (KLOR-CON) CR tablet 40 mEq  40 mEq Oral Daily Nita Sells, MD   40 mEq at 12/04/20 [id number]   pregabalin (LYRICA) capsule 100 mg  100 mg Oral TID Ceasar Mons, MD   100 mg at 12/04/20 [id number]   senna-docusate (Senokot-S) tablet 1 tablet  1 tablet Oral BID Alexis Frock, MD   1 tablet at 12/04/20 [id number]   tamsulosin [facility]) capsule 0.4 mg  0.4 mg Oral QPC supper Winter, Christopher Aaron, MD   0.4 mg at 12/03/20 1810     Discharge Medications: Please see discharge summary for a list of discharge medications.  Relevant Imaging Results:  Relevant Lab Results:   Additional Information SSN: [id number]  Amaris Garrette, Juliann Pulse, RN

## 2020-12-04 NOTE — Progress Notes (Signed)
Physical Therapy Treatment Patient Details Name: Brandon Schaefer MRN: [id number] DOB: 1955-11-09 Today's Date: 12/04/2020    History of Present Illness Pt is a 65 year old male admitted with sepsis on admission urinary cause and Urolithiasis status post cystoscopy 11/27/2020. PMHx significant of type 2 diabetes mellitus, peripheral neuropathy, hypertension anxiety,colon cancer right medial malleolus fracture, cellulitis    PT Comments    Discussed with patient options and concerns for Dc home brought up. Patient declines SNF and stated that he felt he can manage at home. Did agree to [place]. Patient ambulated x 50' x 2 using Rw. SPO2 90% after ambulating. HR 113.  Will ask mobility to ambulate this PM.  Follow Up Recommendations  Home health PT     Equipment Recommendations  None recommended by PT    Recommendations for Other Services       Precautions / Restrictions Precautions Precautions: Fall    Mobility  Bed Mobility   Bed Mobility: Supine to Sit     Supine to sit: Min guard     General bed mobility comments: use of raill- patient sleeps in a recliner    Transfers Overall transfer level: Needs assistance Equipment used: Rolling walker (2 wheeled) Transfers: Sit to/from Stand   Stand pivot transfers: Supervision          Ambulation/Gait Ambulation/Gait assistance: Supervision Gait Distance (Feet): 50 Feet (x 2) Assistive device: Rolling walker (2 wheeled) Gait Pattern/deviations: Step-through pattern;Decreased stride length Gait velocity: decreased   General Gait Details: tends to lean forward with RW ahead.   Stairs             Wheelchair Mobility    Modified Rankin (Stroke Patients Only)       Balance Overall balance assessment: Needs assistance;History of Falls         Standing balance support: Bilateral upper extremity supported Standing balance-Leahy Scale: Fair Standing balance comment: reliant on UE support                             Cognition Arousal/Alertness: Awake/alert Behavior During Therapy: Flat affect;WFL for tasks assessed/performed                                   General Comments: patient states that he feels like  he can manage. agreed with HHPT.      Exercises      General Comments        Pertinent Vitals/Pain Pain Score: 5  Pain Location: above abdomen Pain Descriptors / Indicators: Aching;Discomfort;Grimacing Pain Intervention(s): Patient requesting pain meds-RN notified    Home Living                      Prior Function            PT Goals (current goals can now be found in the care plan section) Progress towards PT goals: Progressing toward goals    Frequency    Min 3X/week      PT Plan Discharge plan needs to be updated    Co-evaluation              AM-PAC PT "6 Clicks" Mobility   Outcome Measure  Help needed turning from your back to your side while in a flat bed without using bedrails?: A Little Help needed moving from lying on your back to sitting on the  side of a flat bed without using bedrails?: A Little Help needed moving to and from a bed to a chair (including a wheelchair)?: A Little Help needed standing up from a chair using your arms (e.g., wheelchair or bedside chair)?: A Little Help needed to walk in hospital room?: A Little Help needed climbing 3-5 steps with a railing? : A Little 6 Click Score: 18    End of Session Equipment Utilized During Treatment: Gait belt Activity Tolerance: Patient tolerated treatment well Patient left: in chair;with call bell/phone within reach;with chair alarm set Nurse Communication: Mobility status PT Visit Diagnosis: Difficulty in walking, not elsewhere classified (R26.2)     Time: [id number] PT Time Calculation (min) (ACUTE ONLY): 25 min  Charges:  $Gait Training: 23-37 mins                     [place] Pager [phone] Office  [phone]    Claretha Cooper 12/04/2020, 1:13 PM

## 2020-12-04 NOTE — Progress Notes (Signed)
Physical Therapy Note Spoke with patient again about recommending Short term rehab, Patient agreeable. PT recommends SNF to improve safety and functional independence. Patient's brother present.  MD being notified of DC recommendation. Tresa Endo PT Acute Rehabilitation Services Pager [phone] Office [phone]

## 2020-12-04 NOTE — TOC Progression Note (Signed)
Transition of Care [facility]) - Progression Note    Patient Details  Name: Brandon Schaefer MRN: [id number] Date of Birth: [birth year]  [facility] CM/SW Contact  Tyrin Herbers, Juliann Pulse, RN Phone Number: 12/04/2020, 4:48 PM  Clinical Narrative: PT recc SNF-noted patient unable to make own decision-spoke to Nicole Kindred son-agreed to SNF-will fax out-unable to confirm if vaccinated.      Expected Discharge Plan: [place]    Expected Discharge Plan and Services Expected Discharge Plan: [place]   Discharge Planning Services: CM Consult   Living arrangements for the past 2 months: Single Family Home Expected Discharge Date: 12/04/20                                     Social Determinants of Health (SDOH) Interventions    Readmission Risk Interventions No flowsheet data found.

## 2020-12-04 NOTE — Progress Notes (Signed)
   12/04/20 1342  Mobility  Activity Refused mobility    Cancellation/Refusal: Pt refused mobility at this time. Pt anticipating d/c and prefers to not ambulate before leaving.   [place] Specialist Acute Rehabilitation Services Phone: [phone] 12/04/20, 1:43 PM

## 2020-12-05 LAB — GLUCOSE, CAPILLARY
Glucose-Capillary: 152 mg/dL — ABNORMAL HIGH (ref 70–99)
Glucose-Capillary: 160 mg/dL — ABNORMAL HIGH (ref 70–99)
Glucose-Capillary: 197 mg/dL — ABNORMAL HIGH (ref 70–99)
Glucose-Capillary: 225 mg/dL — ABNORMAL HIGH (ref 70–99)

## 2020-12-05 LAB — BASIC METABOLIC PANEL
Anion gap: 8 (ref 5–15)
BUN: 16 mg/dL (ref 8–23)
CO2: 32 mmol/L (ref 22–32)
Calcium: 8.7 mg/dL — ABNORMAL LOW (ref 8.9–10.3)
Chloride: 97 mmol/L — ABNORMAL LOW (ref 98–111)
Creatinine, Ser: 1 mg/dL (ref 0.61–1.24)
GFR, Estimated: >60 mL/min (ref >60)
Glucose, Bld: 243 mg/dL — ABNORMAL HIGH (ref 70–99)
Potassium: 4.3 mmol/L (ref 3.5–5.1)
Sodium: 137 mmol/L (ref 135–145)

## 2020-12-05 MED ORDER — INSULIN DETEMIR 100 UNIT/ML ~~LOC~~ SOLN
30.0000 [IU] | Freq: Two times a day (BID) | SUBCUTANEOUS | Status: DC
  Administered 2020-12-05 – 2020-12-06 (×3): 30 [IU] via SUBCUTANEOUS
  Filled 2020-12-05 (×3): qty 0.3

## 2020-12-05 NOTE — Plan of Care (Signed)
  Problem: Coping: Goal: Level of anxiety will decrease Outcome: Completed/Met

## 2020-12-05 NOTE — Progress Notes (Signed)
PROGRESS NOTE    Brandon Schaefer  [id number] DOB: [birth year] DOA: 11/26/2020 PCP: Binnie Rail, MD   Brief Narrative:  65 year old male with history of colon cancer-partial colectomy 2013 L+ LR nephrolithiasis multiple urinary stones-nephrostolithotomy bilaterally 2014. 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 finally admitted for sepsis secondary to unclear source initially but later found to have UTI.  Also found to have rhabdomyolysis with CK 10,000 CT scan showed ureteric stone to left side  Underwent cystoscopy 8/15 Dr. Lovena Neighbours with left 6 French 26 cm JJ stent without tether an indwelling 37 French Foley placed and subsequently removed.  Assessment & Plan:   Principal Problem:   Sepsis ([place]) Active Problems:   DM (diabetes mellitus) ([name])   HTN (hypertension)   B12 deficiency   Rhabdomyolysis   AKI (acute kidney injury) ([place])  Sepsis secondary to UTI, present on admission: Initially received IV antibiotics, transition to oral Augmentin and continue until 12/08/2020.  Urolithiasis: Status post cystoscopy 11/27/2020.  Hematuria resolved.  Follow-up with urology as outpatient.  Rhabdomyolysis: Resolved.  AKI: Secondary to rhabdomyolysis.  Resolved.  Bilateral lower extremity swelling/chronic ischemic changes: Dressing in place on the right lower extremity.  Morbid obesity: Diet as well as exercise counseling provided.  Weight loss recommended.  Type 2 diabetes mellitus: Blood sugar still elevated, currently on 25 units twice daily Lantus.  Will increase to 30 units twice daily and continue SSI.  Hypokalemia: 3.4 yesterday.  He is on potassium chloride daily p.o.  We will repeat BMP today.  DVT prophylaxis: heparin injection 5,000 Units Start: 11/28/20 2200 SCDs Start: 11/27/20 0612   Code Status: Full  Code  Family Communication: None present at bedside.  Plan of care discussed with patient in length and he verbalized understanding and agreed with it.  Status is: Inpatient  Remains inpatient appropriate because:Unsafe d/c plan  Dispo: The patient is from: Home              Anticipated d/c is to: SNF              Patient currently is medically stable to d/c.   Difficult to place patient No        Estimated body mass index is 53.16 kg/m as calculated from the following:   Height as of this encounter: 6' (1.829 m).   Weight as of this encounter: 177.8 kg.     Nutritional Assessment: Body mass index is 53.16 kg/m.[name] Seen by dietician.  I agree with the assessment and plan as outlined below: Nutrition Status:        .  Skin Assessment: I have examined the patient's skin and I agree with the wound assessment as performed by the wound care RN as outlined below:    Consultants:  Urology  Procedures:  As above  Antimicrobials:  Anti-infectives (From admission, onward)    Start     Dose/Rate Route Frequency Ordered Stop   12/04/20 0000  amoxicillin-clavulanate (AUGMENTIN) 875-125 MG tablet        1 tablet Oral Every 12 hours 12/04/20 1303 12/07/20 2359   11/29/20 2200  amoxicillin-clavulanate (AUGMENTIN) 875-125 MG per tablet 1 tablet        1 tablet Oral Every 12 hours 11/29/20 1411     11/29/20 1000  vancomycin (VANCOREADY) IVPB 1750 mg/350 mL  Status:  Discontinued  1,750 mg 175 mL/hr over 120 Minutes Intravenous Every 24 hours 11/28/20 0848 11/28/20 1343   11/28/20 0400  vancomycin (VANCOCIN) 2,500 mg in sodium chloride 0.9 % 500 mL IVPB  Status:  Discontinued        2,500 mg 250 mL/hr over 120 Minutes Intravenous Every 24 hours 11/27/20 0441 11/28/20 0848   11/27/20 0445  vancomycin (VANCOREADY) IVPB 2000 mg/400 mL        2,000 mg 200 mL/hr over 120 Minutes Intravenous  Once 11/27/20 0432 11/27/20 0806   11/27/20 0445  ceFEPIme (MAXIPIME) 2 g in sodium  chloride 0.9 % 100 mL IVPB  Status:  Discontinued        2 g 200 mL/hr over 30 Minutes Intravenous Every 8 hours 11/27/20 0432 11/29/20 1411   11/26/20 2200  doxycycline (VIBRAMYCIN) 100 mg in sodium chloride 0.9 % 250 mL IVPB        100 mg 125 mL/hr over 120 Minutes Intravenous  Once 11/26/20 2149 11/27/20 0129          Subjective: Seen and examined.  No complaint other than just generalized weakness and lower extremity pain.  Objective: Vitals:   12/04/20 2037 12/05/20 0401 12/05/20 0408 12/05/20 1207  BP: (!) 146/55  (!) 135/47 (!) 151/55  Pulse: (!) 52  84 77  Resp: '18  14 16  '$ Temp: 98.6 F (37 C)  97.7 F (36.5 C) 97.7 F (36.5 C)  TempSrc: Oral  Oral Oral  SpO2: 94%  93% 95%  Weight:  (!) 177.8 kg    Height:        Intake/Output Summary (Last 24 hours) at 12/05/2020 1318 Last data filed at 12/05/2020 1210 Gross per 24 hour  Intake 960 ml  Output 675 ml  Net 285 ml   Filed Weights   11/26/20 2106 12/05/20 0401  Weight: (!) 190.5 kg (!) 177.8 kg    Examination:  General exam: Appears calm and comfortable, obese Respiratory system: Clear to auscultation. Respiratory effort normal. Cardiovascular system: S1 & S2 heard, RRR. No JVD, murmurs, rubs, gallops or clicks.  +2 pitting edema bilateral lower extremity Gastrointestinal system: Abdomen is nondistended, soft and nontender. No organomegaly or masses felt. Normal bowel sounds heard. Central nervous system: Alert and oriented. No focal neurological deficits. Extremities: Symmetric 5 x 5 power. Skin: Wound dressing placed in right lower extremity. Psychiatry: Judgement and insight appear normal. Mood & affect appropriate.    Data Reviewed: I have personally reviewed following labs and imaging studies  CBC: Recent Labs  Lab 11/29/20 0455 11/30/20 0636 12/01/20 0509 12/03/20 0416  WBC 9.7 8.4 8.7 11.7*  NEUTROABS  --   --  5.4 8.2*  HGB 12.3* 12.7* 13.2 13.4  HCT 36.9* 39.0 40.6 40.9  MCV 85.0 85.0  84.8 85.0  PLT 208 223 264 [id number]   Basic Metabolic Panel: Recent Labs  Lab 11/29/20 0455 11/30/20 0636 12/01/20 0509 12/03/20 0416 12/04/20 0528  NA 139 138 137 135 140  K 4.1 4.0 3.5 3.2* 3.4*  CL 105 104 101 99 102  CO2 '26 27 27 30 30  '$ GLUCOSE 221* 211* 194* 191* 194*  BUN '17 16 15 19 16  '$ CREATININE 0.87 0.82 0.80 0.76 0.82  CALCIUM 8.7* 8.8* 8.7* 8.4* 8.5*   GFR: Estimated Creatinine Clearance: 147.5 mL/min (by C-G formula based on SCr of 0.82 mg/dL). Liver Function Tests: Recent Labs  Lab 11/30/20 0636 12/01/20 0509 12/03/20 0416  AST 96* 79* 61*  ALT 61* 61* 71*  ALKPHOS 66 60 69  BILITOT 0.6 0.7 0.7  PROT 5.8* 5.7* 5.8*  ALBUMIN 2.8* 2.8* 2.9*   No results for input(s): LIPASE, AMYLASE in the last 168 hours. No results for input(s): AMMONIA in the last 168 hours. Coagulation Profile: No results for input(s): INR, PROTIME in the last 168 hours. Cardiac Enzymes: Recent Labs  Lab 11/29/20 0455 11/30/20 0636 12/04/20 0528  CKTOTAL 3,646* 2,304* 176   BNP (last 3 results) No results for input(s): PROBNP in the last 8760 hours. HbA1C: No results for input(s): HGBA1C in the last 72 hours. CBG: Recent Labs  Lab 12/04/20 0419 12/04/20 1123 12/04/20 2039 12/05/20 0044 12/05/20 0357  GLUCAP 178* 194* 203* 160* 152*   Lipid Profile: No results for input(s): CHOL, HDL, LDLCALC, TRIG, CHOLHDL, LDLDIRECT in the last 72 hours. Thyroid Function Tests: No results for input(s): TSH, T4TOTAL, FREET4, T3FREE, THYROIDAB in the last 72 hours. Anemia Panel: No results for input(s): VITAMINB12, FOLATE, FERRITIN, TIBC, IRON, RETICCTPCT in the last 72 hours. Sepsis Labs: No results for input(s): PROCALCITON, LATICACIDVEN in the last 168 hours.  Recent Results (from the past 240 hour(s))  Blood culture (routine x 2)     Status: None   Collection Time: 11/26/20 11:10 PM   Specimen: BLOOD  Result Value Ref Range Status   Specimen Description   Final    BLOOD BLOOD  LEFT FOREARM Performed at [facility] [address] [place], [place] [address]    Special Requests   Final    BOTTLES DRAWN AEROBIC AND ANAEROBIC Blood Culture adequate volume Performed at [facility] [address], [place], [place] [address]    Culture   Final    NO GROWTH 5 DAYS Performed at [facility], [place] [address], [place], [place] [address]    Report Status 12/02/2020 FINAL  Final  Urine Culture     Status: Abnormal   Collection Time: 11/27/20 12:51 AM   Specimen: Urine, Clean Catch  Result Value Ref Range Status   Specimen Description   Final    URINE, CLEAN CATCH Performed at [facility], [place] [address] [place], [place] [address]    Special Requests   Final    NONE Performed at [facility], [place] [address], [place], [place] [address]    Culture (A)  Final    <10,000 COLONIES/mL INSIGNIFICANT GROWTH Performed at [facility] [address], [place], [place] [address]    Report Status 11/28/2020 FINAL  Final  Blood culture (routine x 2)     Status: None   Collection Time: 11/27/20  1:08 AM   Specimen: BLOOD  Result Value Ref Range Status   Specimen Description   Final    BLOOD BLOOD LEFT FOREARM Performed at [facility] [address] [place], [place] [address]    Special Requests   Final    BOTTLES DRAWN AEROBIC AND ANAEROBIC Blood Culture adequate volume Performed at [facility] [address], [place], [place] [address]    Culture   Final    NO GROWTH 5 DAYS Performed at [facility], [place] [address], [place],  [address]    Report Status 12/02/2020 FINAL  Final  Resp Panel by RT-PCR (Flu A&B, Covid) Nasopharyngeal Swab     Status: None   Collection Time: 11/27/20  3:28 AM   Specimen: Nasopharyngeal Swab; Nasopharyngeal(NP) swabs in vial transport medium  Result Value  Ref Range Status   SARS Coronavirus 2  by RT PCR NEGATIVE NEGATIVE Final    Comment: (NOTE) SARS-CoV-2 target nucleic acids are NOT DETECTED.  The SARS-CoV-2 RNA is generally detectable in upper respiratory specimens during the acute phase of infection. The lowest concentration of SARS-CoV-2 viral copies this assay can detect is 138 copies/mL. A negative result does not preclude SARS-Cov-2 infection and should not be used as the sole basis for treatment or other patient management decisions. A negative result may occur with  improper specimen collection/handling, submission of specimen other than nasopharyngeal swab, presence of viral mutation(s) within the areas targeted by this assay, and inadequate number of viral copies(<138 copies/mL). A negative result must be combined with clinical observations, patient history, and epidemiological information. The expected result is Negative.  Fact Sheet for Patients:  EntrepreneurPulse.com.au  Fact Sheet for Healthcare Providers:  IncredibleEmployment.be  This test is no t yet approved or cleared by the [place] FDA and  has been authorized for detection and/or diagnosis of SARS-CoV-2 by FDA under an Emergency Use Authorization (EUA). This EUA will remain  in effect (meaning this test can be used) for the duration of the COVID-19 declaration under Section 564(b)(1) of the Act, 21 U.S.C.section 360bbb-3(b)(1), unless the authorization is terminated  or revoked sooner.       Influenza A by PCR NEGATIVE NEGATIVE Final   Influenza B by PCR NEGATIVE NEGATIVE Final    Comment: (NOTE) The Xpert Xpress SARS-CoV-2/FLU/RSV plus assay is intended as an aid in the diagnosis of influenza from Nasopharyngeal swab specimens and should not be used as a sole basis for treatment. Nasal washings and aspirates are unacceptable for Xpert Xpress SARS-CoV-2/FLU/RSV testing.  Fact Sheet for Patients: EntrepreneurPulse.com.au  Fact  Sheet for Healthcare Providers: IncredibleEmployment.be  This test is not yet approved or cleared by the [place] FDA and has been authorized for detection and/or diagnosis of SARS-CoV-2 by FDA under an Emergency Use Authorization (EUA). This EUA will remain in effect (meaning this test can be used) for the duration of the COVID-19 declaration under Section 564(b)(1) of the Act, 21 U.S.C. section 360bbb-3(b)(1), unless the authorization is terminated or revoked.  Performed at [facility], [place] [address], [place], [place] [address]       Radiology Studies: No results found.  Scheduled Meds:  amoxicillin-clavulanate  1 tablet Oral Q12H   carvedilol  6.25 mg Oral BID WC   DULoxetine  30 mg Oral Daily   heparin injection (subcutaneous)  5,000 Units Subcutaneous Q8H   insulin aspart  0-15 Units Subcutaneous Q4H   insulin detemir  30 Units Subcutaneous BID   ketorolac  10 mg Oral Q8H   pantoprazole  40 mg Oral Daily   potassium chloride  40 mEq Oral Daily   pregabalin  100 mg Oral TID   senna-docusate  1 tablet Oral BID   tamsulosin  0.4 mg Oral QPC supper   Continuous Infusions:   LOS: 8 days   Time spent: 30 minutes   Darliss Cheney, MD [facility]  12/05/2020, 1:18 PM   How to contact the [facility] Attending or Consulting provider [place] or covering provider during after hours [place], for this patient?  Check the care team in [place] [facility] and look for a) attending/consulting TRH provider listed and b) the [facility] team listed. Page or secure chat [phone]. Log into [email or web address] and use [place] universal password to access. If you do not have the password, please  contact the hospital operator. Locate the [facility] provider you are looking for under [facility] and page to a number that you can be directly reached. If you still have difficulty reaching the provider, please page the [facility] (Director on Call) for the Hospitalists listed on  amion for assistance.

## 2020-12-05 NOTE — TOC Progression Note (Signed)
Transition of Care [facility]) - Progression Note    Patient Details  Name: Caisyn Caputo MRN: [id number] Date of Birth: 09/29/1955  Transition of Care [facility]) CM/SW Contact  Vin Yonke, Juliann Pulse, RN Phone Number: 12/05/2020, 10:15 AM  Clinical Narrative:Clarification of prior CM note- spoke to [facility] brother-patient has been vaccinated-Pfizer x2,booster x1.     Expected Discharge Plan: [place]    Expected Discharge Plan and Services Expected Discharge Plan: [place]   Discharge Planning Services: CM Consult   Living arrangements for the past 2 months: Single Family Home Expected Discharge Date: 12/04/20                                     Social Determinants of Health (SDOH) Interventions    Readmission Risk Interventions No flowsheet data found.

## 2020-12-05 NOTE — TOC Progression Note (Addendum)
Transition of Care [facility]) - Progression Note    Patient Details  Name: Forster Pamer MRN: [id number] Date of Birth: [birth year]  Transition of Care [facility]) CM/SW Contact  Ross Ludwig, [place] Phone Number: 12/05/2020, 3:09 PM  Clinical Narrative:     CSW provided bed choices for patient to his brother [name].  CSW explained that because of patient's weight, it is going to be limited on where he can go.  CSW explained to patient's brother that if insurance denies, the other options are to appeal the decision which would involve contacting insurance company and explaining why he needs SNF for rehab.  The other option is going home with home health service, which he may be seen 2-4 times a week depending on what he qualifies for.  CSW requested that patient's brother contact CSW back today, so insurance authorization can be started.  CSW to continue to follow patient's progress.  CSW to continue to follow patient's progress throughout discharge planning.  5:30pm  CSW spoke to patient's brother [name], he discussed with patient, and they have decided they would like to try going home with home health services.  CSW explained to patient's brother the amount of visits will be determined by the home health agency and insurance company.  CSW asked if patient's brother had a preference and he said no.  CSW contacted [facility], and they will review information, CSW to also check with other [facility] agencies.   Expected Discharge Plan: [place]    Expected Discharge Plan and Services Expected Discharge Plan: [place]   Discharge Planning Services: CM Consult   Living arrangements for the past 2 months: Single Family Home Expected Discharge Date: 12/04/20                                     Social Determinants of Health (SDOH) Interventions    Readmission Risk Interventions No flowsheet data found.

## 2020-12-06 DIAGNOSIS — E1142 Type 2 diabetes mellitus with diabetic polyneuropathy: Secondary | ICD-10-CM

## 2020-12-06 DIAGNOSIS — E538 Deficiency of other specified B group vitamins: Secondary | ICD-10-CM

## 2020-12-06 DIAGNOSIS — N179 Acute kidney failure, unspecified: Secondary | ICD-10-CM

## 2020-12-06 DIAGNOSIS — M6282 Rhabdomyolysis: Secondary | ICD-10-CM

## 2020-12-06 DIAGNOSIS — I10 Essential (primary) hypertension: Secondary | ICD-10-CM

## 2020-12-06 DIAGNOSIS — Z794 Long term (current) use of insulin: Secondary | ICD-10-CM

## 2020-12-06 LAB — COMPREHENSIVE METABOLIC PANEL
ALT: 50 U/L — ABNORMAL HIGH (ref 0–44)
AST: 31 U/L (ref 15–41)
Albumin: 3 g/dL — ABNORMAL LOW (ref 3.5–5.0)
Alkaline Phosphatase: 63 U/L (ref 38–126)
Anion gap: 4 — ABNORMAL LOW (ref 5–15)
BUN: 15 mg/dL (ref 8–23)
CO2: 33 mmol/L — ABNORMAL HIGH (ref 22–32)
Calcium: 8.6 mg/dL — ABNORMAL LOW (ref 8.9–10.3)
Chloride: 102 mmol/L (ref 98–111)
Creatinine, Ser: 0.86 mg/dL (ref 0.61–1.24)
GFR, Estimated: >60 mL/min (ref >60)
Glucose, Bld: 191 mg/dL — ABNORMAL HIGH (ref 70–99)
Potassium: 3.5 mmol/L (ref 3.5–5.1)
Sodium: 139 mmol/L (ref 135–145)
Total Bilirubin: 0.5 mg/dL (ref 0.3–1.2)
Total Protein: 5.9 g/dL — ABNORMAL LOW (ref 6.5–8.1)

## 2020-12-06 LAB — CBC WITH DIFFERENTIAL/PLATELET
Abs Immature Granulocytes: 0.65 10*3/uL — ABNORMAL HIGH (ref 0.00–0.07)
Basophils Absolute: 0 10*3/uL (ref 0.0–0.1)
Basophils Relative: 0 %
Eosinophils Absolute: 0.4 10*3/uL (ref 0.0–0.5)
Eosinophils Relative: 5 %
HCT: 40.7 % (ref 39.0–52.0)
Hemoglobin: 13.2 g/dL (ref 13.0–17.0)
Immature Granulocytes: 9 %
Lymphocytes Relative: 20 %
Lymphs Abs: 1.5 10*3/uL (ref 0.7–4.0)
MCH: 28.3 pg (ref 26.0–34.0)
MCHC: 32.4 g/dL (ref 30.0–36.0)
MCV: 87.2 fL (ref 80.0–100.0)
Monocytes Absolute: 0.6 10*3/uL (ref 0.1–1.0)
Monocytes Relative: 7 %
Neutro Abs: 4.5 10*3/uL (ref 1.7–7.7)
Neutrophils Relative %: 59 %
Platelets: 267 10*3/uL (ref 150–400)
RBC: 4.67 MIL/uL (ref 4.22–5.81)
RDW: 14.4 % (ref 11.5–15.5)
WBC: 7.6 10*3/uL (ref 4.0–10.5)
nRBC: 0 % (ref 0.0–0.2)

## 2020-12-06 LAB — GLUCOSE, CAPILLARY
Glucose-Capillary: 154 mg/dL — ABNORMAL HIGH (ref 70–99)
Glucose-Capillary: 163 mg/dL — ABNORMAL HIGH (ref 70–99)
Glucose-Capillary: 173 mg/dL — ABNORMAL HIGH (ref 70–99)
Glucose-Capillary: 181 mg/dL — ABNORMAL HIGH (ref 70–99)
Glucose-Capillary: 184 mg/dL — ABNORMAL HIGH (ref 70–99)
Glucose-Capillary: 187 mg/dL — ABNORMAL HIGH (ref 70–99)
Glucose-Capillary: 264 mg/dL — ABNORMAL HIGH (ref 70–99)
Glucose-Capillary: 267 mg/dL — ABNORMAL HIGH (ref 70–99)

## 2020-12-06 LAB — MAGNESIUM: Magnesium: 1.8 mg/dL (ref 1.7–2.4)

## 2020-12-06 LAB — PHOSPHORUS: Phosphorus: 3.5 mg/dL (ref 2.5–4.6)

## 2020-12-06 MED ORDER — MAGNESIUM SULFATE 2 GM/50ML IV SOLN
2.0000 g | Freq: Once | INTRAVENOUS | Status: DC
  Filled 2020-12-06: qty 50

## 2020-12-06 MED ORDER — MAGNESIUM OXIDE -MG SUPPLEMENT 400 (240 MG) MG PO TABS
400.0000 mg | ORAL_TABLET | Freq: Once | ORAL | Status: AC
  Administered 2020-12-06: 400 mg via ORAL
  Filled 2020-12-06: qty 1

## 2020-12-06 MED ORDER — AMOXICILLIN-POT CLAVULANATE 875-125 MG PO TABS: 1.0000 | ORAL_TABLET | Freq: Two times a day (BID) | ORAL | 0 refills | Status: AC

## 2020-12-06 NOTE — Plan of Care (Signed)
  Problem: Health Behavior/Discharge Planning: Goal: Ability to manage health-related needs will improve Outcome: Completed/Met

## 2020-12-06 NOTE — Discharge Summary (Signed)
Physician Discharge Summary  Eshawn Ohagan [id number] DOB: 11-01-1955 DOA: 11/26/2020  PCP: Binnie Rail, MD  Admit date: 11/26/2020 Discharge date: 12/06/2020  Admitted From: Home Disposition: Home with [facility] PT/OT/SW as patient has refused SNF  Recommendations for Outpatient Follow-up:  Follow up with PCP in 1-2 weeks Please obtain CMP/CBC, Mag, Phos in one week Please follow up on the following pending results:  Home Health: Yes Equipment/Devices: None  Discharge Condition: Stable CODE STATUS: FULL CODE Diet recommendation:   Brief/Interim Summary: The patient is a 65 year old morbidly obese Caucasian male with past medical history for colon cancer status post partial colectomy in 2013, history of left-sided nephrolithiasis with multiple urinary stones status post nephrostolithotomy bilaterally in 2014, 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  He did not hit his head but was down for 3 hours in on presentation his WBC was 25,000 and his creatinine is 1.3 and he is found to have frank hematuria.  He does have chronic lower extremity skin changes and was admitted for sepsis secondary to unclear source initially was later found to have UTI and also found to have rhabdomyolysis with a CK of 10,000.  CT scan showed a ureteric stone to the left and he underwent a cystoscopy on 11/27/2020 with a 6 French 26 cm JJ stent without tether and an indwelling 16 French Foley catheter that was placed and subsequently removed.  Initial plan was for him to go to SNF however he did not like his options so he elected to go home with home health instead.  He is subsequently improved and was stable for discharge  diagnoses:  Principal Problem:   Sepsis ([place]) Active Problems:   DM (diabetes mellitus) ([place])   HTN (hypertension)   B12 deficiency   Rhabdomyolysis   AKI (acute kidney injury) ([place])  Sepsis secondary to  UTI, present on admission:  -Sepsis physiology is much improved and his discharge WBC was 7.6 -Initially received IV antibiotics, transition to oral Augmentin and continue until 12/08/2020.   Urolithiasis -Status post cystoscopy 11/27/2020.  Hematuria resolved.  Follow-up with urology as outpatient.   Rhabdomyolysis:  -Resolved.   AKI: Secondary to rhabdomyolysis.   -Resolved.  Patient BUN/creatinine at discharge was 15/0.86   Bilateral lower extremity swelling/chronic ischemic changes:  -Dressing in place on the right lower extremity. -Follow-up with dermatology in outpatient setting   Type 2 diabetes mellitus -Blood sugar still elevated, currently on 25 units twice daily Lantus.  Will increase to 30 units twice daily and continue SSI. -Resume home insulin regimen at discharge -CBGs ranging from 134-225   Hypokalemia -Discharge potassium was 3.5 and was supplemented prior to discharge -Continue monitor and trend and repeat CMP within 1 week  Discharge Instructions  Discharge Instructions     Call MD for:  difficulty breathing, headache or visual disturbances   Complete by: As directed    Call MD for:  extreme fatigue   Complete by: As directed    Call MD for:  hives   Complete by: As directed    Call MD for:  persistant dizziness or light-headedness   Complete by: As directed    Call MD for:  persistant nausea and vomiting   Complete by: As directed    Call MD for:  redness, tenderness, or signs of infection (pain, swelling, redness, odor or green/yellow discharge around incision site)   Complete by: As directed  Call MD for:  severe uncontrolled pain   Complete by: As directed    Call MD for:  temperature >100.4   Complete by: As directed    Diet - low sodium heart healthy   Complete by: As directed    Diet Carb Modified   Complete by: As directed    Discharge instructions   Complete by: As directed    You were cared for by a hospitalist during your hospital  stay. If you have any questions about your discharge medications or the care you received while you were in the hospital after you are discharged, you can call the unit and ask to speak with the hospitalist on call if the hospitalist that took care of you is not available. Once you are discharged, your primary care physician will handle any further medical issues. Please note that NO REFILLS for any discharge medications will be authorized once you are discharged, as it is imperative that you return to your primary care physician (or establish a relationship with a primary care physician if you do not have one) for your aftercare needs so that they can reassess your need for medications and monitor your lab values.  Follow up with PCP and Urology within 1-2 weeks. Take all medications as prescribed. If symptoms change or worsen please return to the ED for evaluation   Increase activity slowly   Complete by: As directed       Allergies as of 12/06/2020       Reactions   [place] Elastic Foam Strap [attends Briefs Small] Hives   **Elastic in socks    Adhesive [tape] Rash        Medication List     TAKE these medications    amoxicillin-clavulanate 875-125 MG tablet Commonly known as: AUGMENTIN Take 1 tablet by mouth every 12 (twelve) hours for 3 days.   aspirin EC 81 MG tablet Take 81 mg by mouth daily.   carvedilol 6.25 MG tablet Commonly known as: COREG TAKE 1 TABLET(6.25 MG) BY MOUTH TWICE DAILY WITH A MEAL What changed: See the new instructions.   Contour Next Test test strip Generic drug: glucose blood USE TO CHECK BLOOD SUGAR THREE TIMES DAILY AS DIRECTED   DULoxetine 30 MG capsule Commonly known as: CYMBALTA TAKE 1 CAPSULE BY MOUTH DAILY What changed: additional instructions   FreeStyle Libre 14 Day Sensor Misc Use as directed to check sugars  E11.65   FreeStyle Libre 2 Reader [place] Use as directed to check sugars   E11.65   glipiZIDE 10 MG tablet Commonly known as:  GLUCOTROL Take 1 tablet (10 mg total) by mouth 2 (two) times daily.   HumaLOG Mix 75/25 (75-25) 100 UNIT/ML Susp injection Generic drug: insulin lispro protamine-lispro INJECT 45 UNITS UNDER THE SKIN EVERY MORNING AND 40 UNITS AT NIGHT What changed:  how much to take how to take this when to take this   INSULIN SYRINGE 1CC/31GX5/16" 31G X 5/16" 1 ML Misc USE AS DIRECTED TWICE DAILY FOR INSULIN   meloxicam 15 MG tablet Commonly known as: MOBIC TAKE 1 TABLET(15 MG) BY MOUTH DAILY What changed: See the new instructions.   metFORMIN 1000 MG tablet Commonly known as: GLUCOPHAGE TAKE 1 TABLET(1000 MG) BY MOUTH TWICE DAILY WITH A MEAL What changed: See the new instructions.   pregabalin 100 MG capsule Commonly known as: LYRICA TAKE 1 CAPSULE(100 MG) BY MOUTH THREE TIMES DAILY What changed: See the new instructions.   ramipril 10 MG capsule Commonly known  as: ALTACE TAKE 1 CAPSULE(10 MG) BY MOUTH DAILY BEFORE BREAKFAST What changed:  how much to take how to take this when to take this   rosuvastatin 20 MG tablet Commonly known as: CRESTOR TAKE 1 TABLET(20 MG) BY MOUTH DAILY BEFORE BREAKFAST What changed: See the new instructions.   Rybelsus 7 MG Tabs Generic drug: Semaglutide Take 7 mg by mouth daily.   tamsulosin 0.4 MG Caps capsule Commonly known as: FLOMAX Take 1 capsule (0.4 mg total) by mouth daily after supper.   triamcinolone ointment 0.1 % Commonly known as: KENALOG APPLY TOPICALLY TO LEGS TWICE DAILY FOR 2 WEEKS        Follow-up Information     Binnie Rail, MD Follow up in 1 week(s).   Specialty: Internal Medicine Contact information: [place] [address] [phone]         Ceasar Mons, MD Follow up in 1 week(s).   Specialty: Urology Contact information: [place] [address] [place] [address] [phone]                Allergies  Allergen Reactions   [place] Elastic Foam Strap [Attends  Briefs Small] Hives    **Elastic in socks    Adhesive [Tape] Rash    Consultations: Urology  Procedures/Studies: DG Knee 1-2 Views Left  Result Date: 11/27/2020 CLINICAL DATA:  Fall x2 days, pain EXAM: LEFT KNEE - 1-2 VIEW COMPARISON:  None. FINDINGS: Limited evaluation due to obliquity/difficulty with patient positioning. No fracture or dislocation is seen. Moderate tricompartmental degenerative changes, most prominent in the medial compartment. Visualized soft tissues are within normal limits. No suprapatellar knee joint effusion. IMPRESSION: No fracture or dislocation is seen. Moderate tricompartmental degenerative changes. Electronically Signed   By: Julian Hy M.D.   On: 11/27/2020 03:49   DG Knee 1-2 Views Right  Result Date: 11/27/2020 CLINICAL DATA:  Fall x2 days, pain EXAM: RIGHT KNEE - 1-2 VIEW COMPARISON:  None. FINDINGS: Lateral view is limited by obliquity. No fracture or dislocation is seen. Moderate tricompartmental degenerative changes, most prominent in the medial compartment. Lateral compartment chondrocalcinosis. No definite suprapatellar knee joint effusion. IMPRESSION: No fracture or dislocation is seen. Moderate tricompartmental degenerative changes. Electronically Signed   By: Julian Hy M.D.   On: 11/27/2020 03:50   CT HEAD WO CONTRAST (5MM)  Result Date: 11/27/2020 CLINICAL DATA:  Fall. EXAM: CT HEAD WITHOUT CONTRAST TECHNIQUE: Contiguous axial images were obtained from the base of the skull through the vertex without intravenous contrast. COMPARISON:  None. FINDINGS: Brain: No evidence of acute infarction, hemorrhage, hydrocephalus, extra-axial collection or mass lesion/mass effect. Mild generalized cerebral atrophy. Vascular: Atherosclerotic vascular calcification of the carotid siphons. No hyperdense vessel. Skull: Normal. Negative for fracture or focal lesion. Sinuses/Orbits: No acute finding. Other: None. IMPRESSION: 1. No acute intracranial abnormality.  Electronically Signed   By: Titus Dubin M.D.   On: 11/27/2020 08:26   DG Chest Port 1 View  Result Date: 11/27/2020 CLINICAL DATA:  Fall EXAM: PORTABLE CHEST 1 VIEW COMPARISON:  11/25/2015 FINDINGS: Lungs are clear.  No pleural effusion or pneumothorax. The heart is top-normal in size. IMPRESSION: No evidence of acute cardiopulmonary disease. Electronically Signed   By: Julian Hy M.D.   On: 11/27/2020 03:51   DG Hand Complete Left  Result Date: 11/27/2020 CLINICAL DATA:  Fall x2 days, generalized pain EXAM: LEFT HAND - COMPLETE 3+ VIEW COMPARISON:  None. FINDINGS: No fracture or dislocation is seen. Prior ORIF  of the distal radius, incompletely visualized. The joint spaces are preserved. Visualized soft tissues are within normal limits. IMPRESSION: Negative. Electronically Signed   By: Julian Hy M.D.   On: 11/27/2020 03:51   DG Hand Complete Right  Result Date: 11/27/2020 CLINICAL DATA:  Fall x2 days, generalized pain EXAM: RIGHT HAND - COMPLETE 3+ VIEW COMPARISON:  None. FINDINGS: No fracture or dislocation is seen. The joint spaces are preserved. Visualized soft tissues are within normal limits. IMPRESSION: Negative. Electronically Signed   By: Julian Hy M.D.   On: 11/27/2020 03:50   DG C-Arm 1-60 Min-No Report  Result Date: 11/27/2020 Fluoroscopy was utilized by the requesting physician.  No radiographic interpretation.   CT RENAL STONE STUDY  Result Date: 11/27/2020 CLINICAL DATA:  Hematuria, unknown cause EXAM: CT ABDOMEN AND PELVIS WITHOUT CONTRAST TECHNIQUE: Multidetector CT imaging of the abdomen and pelvis was performed following the standard protocol without IV contrast. COMPARISON:  CT abdomen/pelvis 09/14/2013 FINDINGS: Lower chest: There is dependent subsegmental atelectasis in the lung bases. There is a 7 mm nodule in the right lower lobe an additional 7 mm nodule in the left base. Both of these nodules were present in 2015 but have minimally increased in  size. The imaged heart is unremarkable. There is no pleural effusion. Hepatobiliary: The liver is unremarkable. Foci of air within the gallbladder are likely due to gallstones. There is no evidence of acute cholecystitis. There is no biliary ductal dilatation. Pancreas: Normal. Spleen: Normal. Adrenals/Urinary Tract: The adrenals are unremarkable. 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 Multiple additional hypodense lesions in the kidneys likely reflects cysts. There are multiple renal stones bilaterally measuring up to 1.7 cm on the left. There also two calculi in the left ureter measuring up to 5 mm distally. There is fullness of the left collecting system without frank hydronephrosis or hydroureter. An additional stone is noted within the left collecting system measuring 6 mm. No stones are seen along the course of the right ureter. Layering hyperdensity in the bladder may reflect additional passed stones. The bladder is otherwise unremarkable. Stomach/Bowel: Postsurgical changes reflecting partial large bowel resection again seen. There is no evidence of local recurrence or complication at the anastomotic site. The stomach is unremarkable. There is no evidence of bowel obstruction. There is no abnormal bowel wall thickening or inflammatory change. Vascular/Lymphatic: There is minimal calcification in the bilateral common iliac arteries and thoracic aorta. There is no abdominal aortic aneurysm. A prominent 1.4 cm portacaval lymph node is unchanged since 2015. A 1.9 cm lymph node along the right iliac chain has slightly enlarged since 2015, nonspecific. A 1.2 cm left iliac chain lymph nodes is unchanged. Reproductive: The prostate and seminal vesicles are unremarkable. Other: There is no ascites or free air. Musculoskeletal: There is grade 1 anterolisthesis of L4 on L5, slightly increased since 2015. There is multilevel degenerative change of the lumbar spine with advanced facet  arthropathy at L4-L5 and L5-S1. There is no acute osseous abnormality or aggressive osseous lesion. IMPRESSION: 1. Two left ureteral stones measuring up to 5 mm as above and an additional 5 mm stone within the left collecting system; there is mild fullness of the left collecting system without frank hydronephrosis or hydroureter. 2. Multiple additional nonobstructing renal stones bilaterally measuring up to 1.7 cm on the left as above. 3. Cholelithiasis without evidence of acute cholecystitis. 4. 12m pulmonary nodules in both lower lobes described above. These nodules have been present since  2015 with minimal interval increase in size. Given minimal increase over 7 years, these nodules are favored to be benign. Recommend continued attention on follow-up studies. Electronically Signed   By: Valetta Mole M.D.   On: 11/27/2020 08:31    Subjective: Seen and examined at bedside he felt well and was ready to go home.  No chest pain or shortness breath.  Denies any concerns or complaints and will be going home now.  Discharge Exam: Vitals:   12/05/20 2058 12/06/20 0430  BP: (!) 136/58 (!) 140/51  Pulse: 81 85  Resp: 18 18  Temp: 98 F (36.7 C) 98.2 F (36.8 C)  SpO2: 93% 91%   Vitals:   12/05/20 1400 12/05/20 2058 12/06/20 0422 12/06/20 0430  BP: (!) 156/73 (!) 136/58  (!) 140/51  Pulse: 78 81  85  Resp:  18  18  Temp: 98.8 F (37.1 C) 98 F (36.7 C)  98.2 F (36.8 C)  TempSrc: Oral Oral  Oral  SpO2: 96% 93%  91%  Weight:   (!) 176.9 kg   Height:       General: Pt is alert, awake, not in acute distress Cardiovascular: RRR, S1/S2 +, no rubs, no gallops Respiratory: Diminished bilaterally, no wheezing, no rhonchi Abdominal: Soft, NT, distended secondary body habitus, bowel sounds + Extremities: 1+ lower extremity edema with wound/skin changes bilaterally.  \  The results of significant diagnostics from this hospitalization (including imaging, microbiology, ancillary and laboratory) are  listed below for reference.    Microbiology: Recent Results (from the past 240 hour(s))  Blood culture (routine x 2)     Status: None   Collection Time: 11/26/20 11:10 PM   Specimen: BLOOD  Result Value Ref Range Status   Specimen Description   Final    BLOOD BLOOD LEFT FOREARM Performed at [facility] [address], [place], [place] [address]    Special Requests   Final    BOTTLES DRAWN AEROBIC AND ANAEROBIC Blood Culture adequate volume Performed at [facility] [address] [place], [place] [address]    Culture   Final    NO GROWTH 5 DAYS Performed at [facility], [place] [address] [place], [place] [address]    Report Status 12/02/2020 FINAL  Final  Urine Culture     Status: Abnormal   Collection Time: 11/27/20 12:51 AM   Specimen: Urine, Clean Catch  Result Value Ref Range Status   Specimen Description   Final    URINE, CLEAN CATCH Performed at [facility], [place] [address], [place], [place] [address]    Special Requests   Final    NONE Performed at [facility], [place] [address], [place], [place] [address]    Culture (A)  Final    <10,000 COLONIES/mL INSIGNIFICANT GROWTH Performed at [facility] [address], [place], [place] [address]    Report Status 11/28/2020 FINAL  Final  Blood culture (routine x 2)     Status: None   Collection Time: 11/27/20  1:08 AM   Specimen: BLOOD  Result Value Ref Range Status   Specimen Description   Final    BLOOD BLOOD LEFT FOREARM Performed at [facility] [address] [place], [place] [address]    Special Requests   Final    BOTTLES DRAWN AEROBIC AND ANAEROBIC Blood Culture adequate volume Performed at [facility] [address], [place],  [address]  Culture   Final    NO GROWTH 5 DAYS Performed at [facility], [place] [address] [place], [place] [address]    Report Status  12/02/2020 FINAL  Final  Resp Panel by RT-PCR (Flu A&B, Covid) Nasopharyngeal Swab     Status: None   Collection Time: 11/27/20  3:28 AM   Specimen: Nasopharyngeal Swab; Nasopharyngeal(NP) swabs in vial transport medium  Result Value Ref Range Status   SARS Coronavirus 2 by RT PCR NEGATIVE NEGATIVE Final    Comment: (NOTE) SARS-CoV-2 target nucleic acids are NOT DETECTED.  The SARS-CoV-2 RNA is generally detectable in upper respiratory specimens during the acute phase of infection. The lowest concentration of SARS-CoV-2 viral copies this assay can detect is 138 copies/mL. A negative result does not preclude SARS-Cov-2 infection and should not be used as the sole basis for treatment or other patient management decisions. A negative result may occur with  improper specimen collection/handling, submission of specimen other than nasopharyngeal swab, presence of viral mutation(s) within the areas targeted by this assay, and inadequate number of viral copies(<138 copies/mL). A negative result must be combined with clinical observations, patient history, and epidemiological information. The expected result is Negative.  Fact Sheet for Patients:  EntrepreneurPulse.com.au  Fact Sheet for Healthcare Providers:  IncredibleEmployment.be  This test is no t yet approved or cleared by the [place] FDA and  has been authorized for detection and/or diagnosis of SARS-CoV-2 by FDA under an Emergency Use Authorization (EUA). This EUA will remain  in effect (meaning this test can be used) for the duration of the COVID-19 declaration under Section 564(b)(1) of the Act, 21 U.S.C.section 360bbb-3(b)(1), unless the authorization is terminated  or revoked sooner.       Influenza A by PCR NEGATIVE NEGATIVE Final   Influenza B by PCR NEGATIVE NEGATIVE Final    Comment: (NOTE) The Xpert Xpress SARS-CoV-2/FLU/RSV plus assay is intended as an aid in the diagnosis of  influenza from Nasopharyngeal swab specimens and should not be used as a sole basis for treatment. Nasal washings and aspirates are unacceptable for Xpert Xpress SARS-CoV-2/FLU/RSV testing.  Fact Sheet for Patients: EntrepreneurPulse.com.au  Fact Sheet for Healthcare Providers: IncredibleEmployment.be  This test is not yet approved or cleared by the [place] FDA and has been authorized for detection and/or diagnosis of SARS-CoV-2 by FDA under an Emergency Use Authorization (EUA). This EUA will remain in effect (meaning this test can be used) for the duration of the COVID-19 declaration under Section 564(b)(1) of the Act, 21 U.S.C. section 360bbb-3(b)(1), unless the authorization is terminated or revoked.  Performed at [facility], [place] [address], [place], [place] [address]     Labs: BNP (last 3 results) No results for input(s): BNP in the last 8760 hours. Basic Metabolic Panel: Recent Labs  Lab 12/01/20 0509 12/03/20 0416 12/04/20 0528 12/05/20 1345 12/06/20 0911  NA 137 135 140 137 139  K 3.5 3.2* 3.4* 4.3 3.5  CL 101 99 102 97* 102  CO2 '27 30 30 '$ 32 33*  GLUCOSE 194* 191* 194* 243* 191*  BUN '15 19 16 16 15  '$ CREATININE 0.80 0.76 0.82 1.00 0.86  CALCIUM 8.7* 8.4* 8.5* 8.7* 8.6*  MG  --   --   --   --  1.8  PHOS  --   --   --   --  3.5   Liver Function Tests: Recent Labs  Lab 11/30/20 0636 12/01/20 0509 12/03/20 0416 12/06/20 0911  AST  96* 79* 61* 31  ALT 61* 61* 71* 50*  ALKPHOS 66 60 69 63  BILITOT 0.6 0.7 0.7 0.5  PROT 5.8* 5.7* 5.8* 5.9*  ALBUMIN 2.8* 2.8* 2.9* 3.0*   No results for input(s): LIPASE, AMYLASE in the last 168 hours. No results for input(s): AMMONIA in the last 168 hours. CBC: Recent Labs  Lab 11/30/20 0636 12/01/20 0509 12/03/20 0416 12/06/20 0911  WBC 8.4 8.7 11.7* 7.6  NEUTROABS  --  5.4 8.2* 4.5  HGB 12.7* 13.2 13.4 13.2  HCT 39.0 40.6 40.9 40.7  MCV 85.0 84.8 85.0  87.2  PLT 223 264 307 267   Cardiac Enzymes: Recent Labs  Lab 11/30/20 0636 12/04/20 0528  CKTOTAL 2,304* 176   BNP: Invalid input(s): POCBNP CBG: Recent Labs  Lab 12/05/20 1617 12/05/20 2017 12/06/20 0002 12/06/20 0355 12/06/20 0745  GLUCAP 225* 197* 184* 181* 154*   D-Dimer No results for input(s): DDIMER in the last 72 hours. Hgb A1c No results for input(s): HGBA1C in the last 72 hours. Lipid Profile No results for input(s): CHOL, HDL, LDLCALC, TRIG, CHOLHDL, LDLDIRECT in the last 72 hours. Thyroid function studies No results for input(s): TSH, T4TOTAL, T3FREE, THYROIDAB in the last 72 hours.  Invalid input(s): FREET3 Anemia work up No results for input(s): VITAMINB12, FOLATE, FERRITIN, TIBC, IRON, RETICCTPCT in the last 72 hours. Urinalysis    Component Value Date/Time   COLORURINE RED (A) 11/27/2020 0051   APPEARANCEUR CLOUDY (A) 11/27/2020 0051   LABSPEC 1.018 11/27/2020 0051   PHURINE 6.0 11/27/2020 0051   GLUCOSEU >=500 (A) 11/27/2020 0051   GLUCOSEU 250 (A) 05/12/2020 0831   HGBUR MODERATE (A) 11/27/2020 0051   BILIRUBINUR NEGATIVE 11/27/2020 0051   KETONESUR NEGATIVE 11/27/2020 0051   PROTEINUR 100 (A) 11/27/2020 0051   UROBILINOGEN 0.2 05/12/2020 0831   NITRITE NEGATIVE 11/27/2020 0051   LEUKOCYTESUR NEGATIVE 11/27/2020 0051   Sepsis Labs Invalid input(s): PROCALCITONIN,  WBC,  LACTICIDVEN Microbiology Recent Results (from the past 240 hour(s))  Blood culture (routine x 2)     Status: None   Collection Time: 11/26/20 11:10 PM   Specimen: BLOOD  Result Value Ref Range Status   Specimen Description   Final    BLOOD BLOOD LEFT FOREARM Performed at [facility], [place] [address], [place], [place] [address]    Special Requests   Final    BOTTLES DRAWN AEROBIC AND ANAEROBIC Blood Culture adequate volume Performed at [facility] [address], [place], [place] [address]    Culture   Final    NO GROWTH 5  DAYS Performed at [facility], [place] [address], [place], [place] [address]    Report Status 12/02/2020 FINAL  Final  Urine Culture     Status: Abnormal   Collection Time: 11/27/20 12:51 AM   Specimen: Urine, Clean Catch  Result Value Ref Range Status   Specimen Description   Final    URINE, CLEAN CATCH Performed at [facility], [place] [address] [place], [place] [address]    Special Requests   Final    NONE Performed at [facility], [place] [address], [place], [place] [address]    Culture (A)  Final    <10,000 COLONIES/mL INSIGNIFICANT GROWTH Performed at [facility] [address] [place], [place] [address]    Report Status 11/28/2020 FINAL  Final  Blood culture (routine x 2)     Status: None   Collection Time:  11/27/20  1:08 AM   Specimen: BLOOD  Result Value Ref Range Status   Specimen Description   Final    BLOOD BLOOD LEFT FOREARM Performed at [facility] [address], [place], [place] [address]    Special Requests   Final    BOTTLES DRAWN AEROBIC AND ANAEROBIC Blood Culture adequate volume Performed at [facility] [address], [place], [place] [address]    Culture   Final    NO GROWTH 5 DAYS Performed at [facility], [place] [address] [place], [place] [address]    Report Status 12/02/2020 FINAL  Final  Resp Panel by RT-PCR (Flu A&B, Covid) Nasopharyngeal Swab     Status: None   Collection Time: 11/27/20  3:28 AM   Specimen: Nasopharyngeal Swab; Nasopharyngeal(NP) swabs in vial transport medium  Result Value Ref Range Status   SARS Coronavirus 2 by RT PCR NEGATIVE NEGATIVE Final    Comment: (NOTE) SARS-CoV-2 target nucleic acids are NOT DETECTED.  The SARS-CoV-2 RNA is generally detectable in upper respiratory specimens during the acute phase of infection. The lowest concentration of SARS-CoV-2 viral copies this assay can detect is 138 copies/mL. A negative  result does not preclude SARS-Cov-2 infection and should not be used as the sole basis for treatment or other patient management decisions. A negative result may occur with  improper specimen collection/handling, submission of specimen other than nasopharyngeal swab, presence of viral mutation(s) within the areas targeted by this assay, and inadequate number of viral copies(<138 copies/mL). A negative result must be combined with clinical observations, patient history, and epidemiological information. The expected result is Negative.  Fact Sheet for Patients:  EntrepreneurPulse.com.au  Fact Sheet for Healthcare Providers:  IncredibleEmployment.be  This test is no t yet approved or cleared by the [place] FDA and  has been authorized for detection and/or diagnosis of SARS-CoV-2 by FDA under an Emergency Use Authorization (EUA). This EUA will remain  in effect (meaning this test can be used) for the duration of the COVID-19 declaration under Section 564(b)(1) of the Act, 21 U.S.C.section 360bbb-3(b)(1), unless the authorization is terminated  or revoked sooner.       Influenza A by PCR NEGATIVE NEGATIVE Final   Influenza B by PCR NEGATIVE NEGATIVE Final    Comment: (NOTE) The Xpert Xpress SARS-CoV-2/FLU/RSV plus assay is intended as an aid in the diagnosis of influenza from Nasopharyngeal swab specimens and should not be used as a sole basis for treatment. Nasal washings and aspirates are unacceptable for Xpert Xpress SARS-CoV-2/FLU/RSV testing.  Fact Sheet for Patients: EntrepreneurPulse.com.au  Fact Sheet for Healthcare Providers: IncredibleEmployment.be  This test is not yet approved or cleared by the [place] FDA and has been authorized for detection and/or diagnosis of SARS-CoV-2 by FDA under an Emergency Use Authorization (EUA). This EUA will remain in effect (meaning this test can be used)  for the duration of the COVID-19 declaration under Section 564(b)(1) of the Act, 21 U.S.C. section 360bbb-3(b)(1), unless the authorization is terminated or revoked.  Performed at [facility], [place] [address] [place], [place] [address]    Time coordinating discharge: 35 minutes  SIGNED:  Kerney Elbe, DO [facility] 12/06/2020, 11:26 AM Pager is on [place]  If 7PM-7AM, please contact night-coverage [email or web address]

## 2020-12-06 NOTE — TOC Transition Note (Signed)
Transition of Care [facility]) - CM/SW Discharge Note   Patient Details  Name: Khonner Derubeis MRN: [id number] Date of Birth: 12-28-1955  [facility] CM/SW Contact:  Ross Ludwig, LCSW Phone Number: 12/06/2020, 1:48 PM   Clinical Narrative:     Patient will be going home with home health PT, OT, and Soc Work, through [place].  CSW signing off please reconsult with any other social work needs, home health agency has been notified of planned discharge.  Patient's brother was notified that patient is discharging today.    Final next level of care: [place] Barriers to Discharge: Barriers Resolved   Patient Goals and CMS Choice Patient states their goals for this hospitalization and ongoing recovery are:: To go home with home health. CMS Medicare.gov Compare Post Acute Care list provided to:: Patient Represenative (must comment) Choice offered to / list presented to : Sibling  Discharge Placement                       Discharge Plan and Services   Discharge Planning Services: CM Consult                      HH Arranged: PT, OT, Social Work [facility] Agency: [place] Date [facility] Agency Contacted: 12/06/20 Time [place]: [id number] Representative spoke with at [place]: [place] Determinants of Health ([place]) Interventions     Readmission Risk Interventions No flowsheet data found.

## 2020-12-06 NOTE — TOC Progression Note (Addendum)
Transition of Care [facility]) - Progression Note    Patient Details  Name: Marwood Corkill MRN: [id number] Date of Birth: 20-Oct-1955  [facility] CM/SW Contact  Ross Ludwig, [place] Phone Number: 12/06/2020, 10:56 AM  Clinical Narrative:    CSW contacted home health agencies due to patient and brother [name] not agreeing to the SNF options.  [facility] and [facility] can not accept patient for home health.  [facility] agreed to accept patient for home health services.  CSW updated MD that patient and family would like [place] PT, OT, and Social work.  Per patient's brother, he does not need any other equipment, he already has a walker.  CSW to continue to facilitate discharge planning.   Expected Discharge Plan: [place]    Expected Discharge Plan and Services Expected Discharge Plan: [place]   Discharge Planning Services: CM Consult   Living arrangements for the past 2 months: Single Family Home Expected Discharge Date: 12/04/20                                     Social Determinants of Health (SDOH) Interventions    Readmission Risk Interventions No flowsheet data found.

## 2020-12-06 NOTE — Care Management Important Message (Signed)
Important Message  Patient Details IM Letter given to the Patient. Name: Brandon Schaefer MRN: [id number] Date of Birth: Dec 17, 1955   Medicare Important Message Given:  Yes     Kerin Salen 12/06/2020, 1:58 PM

## 2020-12-06 NOTE — Progress Notes (Signed)
Discharge instructions given. Patient verbalized understanding and all questions were answered. Awaiting for family member to transport.

## 2020-12-07 ENCOUNTER — Other Ambulatory Visit: Payer: Self-pay | Admitting: Internal Medicine

## 2020-12-08 ENCOUNTER — Telehealth: Payer: Self-pay

## 2020-12-08 DIAGNOSIS — N136 Pyonephrosis: Secondary | ICD-10-CM | POA: Diagnosis not present

## 2020-12-08 DIAGNOSIS — E1142 Type 2 diabetes mellitus with diabetic polyneuropathy: Secondary | ICD-10-CM | POA: Diagnosis not present

## 2020-12-08 DIAGNOSIS — E119 Type 2 diabetes mellitus without complications: Secondary | ICD-10-CM | POA: Diagnosis not present

## 2020-12-08 DIAGNOSIS — A419 Sepsis, unspecified organism: Secondary | ICD-10-CM | POA: Diagnosis not present

## 2020-12-08 DIAGNOSIS — M170 Bilateral primary osteoarthritis of knee: Secondary | ICD-10-CM | POA: Diagnosis not present

## 2020-12-08 DIAGNOSIS — Z794 Long term (current) use of insulin: Secondary | ICD-10-CM | POA: Diagnosis not present

## 2020-12-08 DIAGNOSIS — I10 Essential (primary) hypertension: Secondary | ICD-10-CM | POA: Diagnosis not present

## 2020-12-08 NOTE — Telephone Encounter (Signed)
Transition Care Management Follow-up Telephone Call Date of discharge and from where: 8/24/222 from [facility] How have you been since you were released from the hospital? I have a long way to go, but I'm managing. Any questions or concerns? No  Items Reviewed: Did the pt receive and understand the discharge instructions provided? Yes  Medications obtained and verified? Yes  Other? No  Any new allergies since your discharge? No  Dietary orders reviewed? No Do you have support at home? Yes   Home Care and Equipment/Supplies: Were home health services ordered? yes If so, what is the name of the agency? Assessment scheduled for 12/08/2020  Has the agency set up a time to come to the patient's home? yes Were any new equipment or medical supplies ordered?  No What is the name of the medical supply agency? N/a Were you able to get the supplies/equipment? not applicable Do you have any questions related to the use of the equipment or supplies? No  Functional Questionnaire: (I = Independent and D = Dependent) ADLs: I  Bathing/Dressing- I  Meal Prep- I  Eating- I  Maintaining continence- I  Transferring/Ambulation- I, with walker  Managing Meds- I  Follow up appointments reviewed:  PCP Hospital f/u appt confirmed? Yes  Scheduled to see Billey Gosling, MD on 12/15/2020 @ 1:40 pm. [place] [facility] f/u appt confirmed? No   Are transportation arrangements needed? No  If their condition worsens, is the pt aware to call PCP or go to the Emergency Dept.? Yes Was the patient provided with contact information for the PCP's office or ED? Yes Was to pt encouraged to call back with questions or concerns? Yes

## 2020-12-11 DIAGNOSIS — M170 Bilateral primary osteoarthritis of knee: Secondary | ICD-10-CM | POA: Diagnosis not present

## 2020-12-11 DIAGNOSIS — I10 Essential (primary) hypertension: Secondary | ICD-10-CM | POA: Diagnosis not present

## 2020-12-11 DIAGNOSIS — E1142 Type 2 diabetes mellitus with diabetic polyneuropathy: Secondary | ICD-10-CM | POA: Diagnosis not present

## 2020-12-11 DIAGNOSIS — N136 Pyonephrosis: Secondary | ICD-10-CM | POA: Diagnosis not present

## 2020-12-11 DIAGNOSIS — A419 Sepsis, unspecified organism: Secondary | ICD-10-CM | POA: Diagnosis not present

## 2020-12-12 ENCOUNTER — Other Ambulatory Visit: Payer: Self-pay | Admitting: Urology

## 2020-12-14 NOTE — Patient Instructions (Addendum)
  Blood work was ordered.     Medications changes include :   none     

## 2020-12-14 NOTE — Progress Notes (Signed)
Subjective:    Patient ID: Brandon Schaefer, male    DOB: [birth year], 65 y.o.   MRN: [id number]  HPI The patient is here for follow up from the hospital.  He is here with his brother.   Admitted 11/26/20 - 12/06/20  Admitted From: Home Disposition: Home with [facility] PT/OT/SW as patient has refused SNF   Recommendations for Outpatient Follow-up:  Follow up with PCP in 1-2 weeks Please obtain CMP/CBC, Mag, Phos in one week  Presented with chills, rigors and fall at home.  He was down for 3 hrs.  WBC 25,000, Cr 1.3 and he had frank hematuria. He had chronic lower extremity changes and was admitted for sepsis secondary to unclear source initially and later found to have a UTI, rhabdomyolysis with CK 10,000.  CT scan showed ureteric stone on left side.  He had cystoscopy on 11/27/20 w/ stent placement w/o tether and indwelling 16 French foley placed and later removed.    Sepsis -  Secondary to UTI Improved with IV abx - then transitioned to Augmentin until 8/26 D/c WBC 8.6  Urolithiasis -  S/p cystoscopy 8/15, stent placed Hematuria resolved F/u urology as outpatient Cystoscopy scheduled for  Rhabdomyolysis -  Resolved  AKI- secondary to rhabdomyolysis Resolved BUn/Cr on d/c 15 / 0.86  B/l LE swelling/chronic ischemic changes: Area with dressing on RLE F/u with dermatology as outpatient  Type 2 DM -  BS still elevated, insulin increased from 25 units to 30 units twice daily Continue SSI  Hypokalemia -  Discharge K 3.5 Repeat cmp     Pt and ot started this week.   Going to the bathroom by himself, getting around ok at home.  Eating weell.  Sleep is normal.  He has completed the antibiotics.  He is taking all his medication as prescribed.  He has been consistent with a low carbohydrate diet and is not currently taking any medication for his diabetes.  Sugars 110-120 - not taking anything  He is still experiencing discomfort in his urinary tract.  He states dysuria  and hematuria.  He has cystoscopy with removal and replacement of stent on 9/28.  Medications and allergies reviewed with patient and updated if appropriate.  Patient Active Problem List   Diagnosis Date Noted   Rhabdomyolysis 12/04/2020   AKI (acute kidney injury) ([place]) 12/04/2020   Sepsis ([place]) 11/27/2020   BMI 50.0-59.9, adult ([place]) 02/10/2020   History of nephrolithiasis 11/10/2019   B12 deficiency 05/11/2019   Tremor 01/27/2018   Right medial malleolar fracture 11/26/2015   Anxiety 11/26/2015   Hyperlipidemia 11/26/2015   Bilateral leg edema 08/22/2015   Back pain 03/20/2015   Anemia, iron deficiency 05/05/2012   DM (diabetes mellitus) ([place]) 01/18/2012   HTN (hypertension) 01/18/2012   Morbid obesity ([place]) 01/17/2012   Cancer of sigmoid colon ([place]) 12/23/2011   Arthritis of knee, degenerative 08/09/2011   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     carvedilol (COREG) 6.25 MG tablet TAKE 1 TABLET(6.25 MG) BY MOUTH TWICE DAILY WITH A MEAL (Patient taking differently: Take 6.25 mg by mouth 2 (two) times daily with a meal.) 180 tablet 1   Continuous Blood Gluc Receiver (FREESTYLE LIBRE 2 READER) DEVI Use as directed to check sugars   E11.65 1 each 0   Continuous Blood Gluc Sensor (FREESTYLE LIBRE  [place]) MISC Use as directed to check sugars  E11.65 28 each 5   CONTOUR NEXT TEST test strip USE TO CHECK BLOOD SUGAR THREE TIMES DAILY AS DIRECTED 300 strip 2   DULoxetine (CYMBALTA) 30 MG capsule TAKE 1 CAPSULE BY MOUTH DAILY (Patient taking differently: Take 30 mg by mouth daily. TAKE 1 CAPSULE BY MOUTH DAILY) 90 capsule 1   glipiZIDE (GLUCOTROL) 10 MG tablet Take 1 tablet (10 mg total) by mouth 2 (two) times daily. 60 tablet 11   insulin lispro protamine-lispro (HUMALOG MIX 75/25) (75-25) 100 UNIT/ML SUSP injection INJECT 45 UNITS UNDER THE  SKIN EVERY MORNING AND 40 UNITS AT NIGHT (Patient taking differently: Inject 40-45 Units into the skin 2 (two) times daily with a meal. INJECT 45 UNITS UNDER THE SKIN EVERY MORNING AND 40 UNITS AT NIGHT) 80 mL 5   Insulin Syringe-Needle U-100 (INSULIN SYRINGE 1CC/31GX5/16") 31G X 5/16" 1 ML MISC USE AS DIRECTED TWICE DAILY FOR INSULIN 200 each 2   meloxicam (MOBIC) 15 MG tablet TAKE 1 TABLET(15 MG) BY MOUTH DAILY (Patient taking differently: Take 15 mg by mouth in the morning and at bedtime. TAKE 1 TABLET(15 MG) BY MOUTH DAILY) 90 tablet 1   metFORMIN (GLUCOPHAGE) 1000 MG tablet TAKE 1 TABLET(1000 MG) BY MOUTH TWICE DAILY WITH A MEAL (Patient taking differently: Take 1,000 mg by mouth 2 (two) times daily.) 180 tablet 1   pregabalin (LYRICA) 100 MG capsule TAKE 1 CAPSULE(100 MG) BY MOUTH THREE TIMES DAILY 90 capsule 0   ramipril (ALTACE) 10 MG capsule TAKE 1 CAPSULE(10 MG) BY MOUTH DAILY BEFORE BREAKFAST (Patient taking differently: Take 10 mg by mouth daily. TAKE 1 CAPSULE(10 MG) BY MOUTH DAILY BEFORE BREAKFAST) 90 capsule 1   rosuvastatin (CRESTOR) 20 MG tablet TAKE 1 TABLET(20 MG) BY MOUTH DAILY BEFORE BREAKFAST (Patient taking differently: Take 20 mg by mouth daily.) 90 tablet 1   Semaglutide (RYBELSUS) 7 MG TABS Take 7 mg by mouth daily. 30 tablet 3   tamsulosin (FLOMAX) 0.4 MG CAPS capsule Take 1 capsule (0.4 mg total) by mouth daily after supper. 30 capsule 0   triamcinolone ointment (KENALOG) 0.1 % APPLY TOPICALLY TO LEGS TWICE DAILY FOR 2 WEEKS     No current facility-administered medications on file prior to visit.    Past Medical History:  Diagnosis Date   Allergy    Anemia    Anxiety    colon ca dx'd 11/2011   Dental abscess 08/03/2015   Diabetes mellitus    Family history of adverse reaction to anesthesia    Headache(784.0)    History of blood transfusion    Hyperlipidemia    Hypertension    Neuromuscular disorder ([place])    peripheral neuropathy   Shortness of breath    with  exertion    Past Surgical History:  Procedure Laterality Date   COMPLEX WOUND CLOSURE N/A 01/06/2013   Procedure: EXCISION CHRONIC ABDOMINAL WOUND;  Surgeon: Harl Bowie, MD;  Location: [facility];  Service: General;  Laterality: N/A;   CYSTOSCOPY W/ URETERAL STENT PLACEMENT Bilateral 01/04/2013   Procedure: CYSTOSCOPY WITH RETROGRADE PYELOGRAM/Right double J URETERAL STENT PLACEMENT;  Surgeon: Alexis Frock, MD;  Location: [facility];  Service: Urology;  Laterality: Bilateral;   CYSTOSCOPY W/ URETERAL STENT PLACEMENT Left 11/27/2020   Procedure: CYSTOSCOPY WITH RETROGRADE PYELOGRAM/URETERAL STENT PLACEMENT;  Surgeon: Ceasar Mons, MD;  Location: [facility];  Service: Urology;  Laterality: Left;   CYSTOSCOPY WITH RETROGRADE PYELOGRAM, URETEROSCOPY AND STENT PLACEMENT  Right 01/06/2013   Procedure: CYSTOSCOPY WITH RIGHT RETROGRADE PYELOGRAM, URETEROSCOPY AND BILATERAL STENT EXCHANGE, BILATERAL STONE BASKET EXTRACTION ;  Surgeon: Alexis Frock, MD;  Location: [facility];  Service: Urology;  Laterality: Right;   FRACTURE SURGERY      ORIF-left radius has pin   HOLMIUM LASER APPLICATION N/A [id number]   Procedure: HOLMIUM LASER APPLICATION;  Surgeon: Alexis Frock, MD;  Location: [facility];  Service: Urology;  Laterality: N/A;   NEPHROLITHOTOMY Left 01/04/2013   Procedure: NEPHROLITHOTOMY PERCUTANEOUS  LEFT 1ST STAGE PERCUTANEOUS NEPHROSTOLITHOTOMY;  Surgeon: Alexis Frock, MD;  Location: [facility];  Service: Urology;  Laterality: Left;   NEPHROLITHOTOMY Left 01/06/2013   Procedure: NEPHROLITHOTOMY PERCUTANEOUS SECOND LOOK;  Surgeon: Alexis Frock, MD;  Location[facility];  Service: Urology;  Laterality: Left;   PARTIAL COLECTOMY  01/17/2012   Procedure: PARTIAL COLECTOMY;  Surgeon: Harl Bowie, MD;  Location[facility];  Service: General;;   PILONIDAL CYST EXCISION     PORT-A-CATH REMOVAL Left 01/06/2013   Procedure: REMOVAL PORT-A-CATH;  Surgeon: Harl Bowie, MD;  Location: [facility];  Service:  General;  Laterality: Left;   PORTACATH PLACEMENT  03/19/2012   Procedure: INSERTION PORT-A-CATH;  Surgeon: Harl Bowie, MD;  Location: [facility];  Service: General;  Laterality: N/A;   PROCTOSCOPY  01/17/2012   Procedure: PROCTOSCOPY;  Surgeon: Harl Bowie, MD;  Location[facility];  Service: General;;    Social History   Socioeconomic History   Marital status: Single    Spouse name: Not on file   Number of children: Not on file   Years of education: Not on file   Highest education level: Not on file  Occupational History   Not on file  Tobacco Use   Smoking status: Never   Smokeless tobacco: Never   Tobacco comments:    NEVER USED TOBACC0  Vaping Use   Vaping Use: Never used  Substance and Sexual Activity   Alcohol use: No    Alcohol/week: 0.0 standard drinks   Drug use: No   Sexual activity: Not on file  Other Topics Concern   Not on file  Social History Narrative   Not on file   Social Determinants of Health   Financial Resource Strain: Not on file  Food Insecurity: Not on file  Transportation Needs: Not on file  Physical Activity: Not on file  Stress: Not on file  Social Connections: Not on file    Family History  Problem Relation Age of Onset   Diabetes Father    Cancer Maternal Aunt        colon   Cancer Maternal Grandmother        colon    Review of Systems  Constitutional:  Negative for fever.  Respiratory:  Negative for cough, shortness of breath and wheezing.   Cardiovascular:  Positive for leg swelling (improved). Negative for chest pain and palpitations.  Gastrointestinal:  Positive for diarrhea. Negative for abdominal pain, blood in stool and nausea.  Genitourinary:  Positive for dysuria and hematuria.  Neurological:  Negative for light-headedness and headaches.      Objective:   Vitals:   12/15/20 1342  BP: 126/80  Pulse: 100  Temp: 98.5 F (36.9 C)  SpO2: 95%   BP Readings from Last 3 Encounters:   12/15/20 126/80  12/06/20 (!) 167/70  11/10/20 (!) 146/78   Wt Readings from Last 3 Encounters:  12/06/20 (!) 390 lb (176.9 kg)  11/10/20 (!) 409 lb (185.5  kg)  08/11/20 (!) 401 lb (181.9 kg)   There is no height or weight on file to calculate BMI.   Physical Exam    Constitutional: Appears well-developed and well-nourished. No distress.  HENT:  Head: Normocephalic and atraumatic.  Neck: Neck supple. No tracheal deviation present. No thyromegaly present.  No cervical lymphadenopathy Cardiovascular: Normal rate, regular rhythm and normal heart sounds.   No murmur heard. No carotid bruit .  No edema Pulmonary/Chest: Effort normal and breath sounds normal. No respiratory distress. No has no wheezes. No rales.  Abdomen: soft, NT, ND Skin: Skin is warm and dry. Not diaphoretic.  Psychiatric: Normal mood and affect. Behavior is normal.    Lab Results  Component Value Date   WBC 6.1 12/15/2020   HGB 12.5 (L) 12/15/2020   HCT 37.7 (L) 12/15/2020   PLT 274.0 12/15/2020   GLUCOSE 98 12/15/2020   CHOL 87 08/11/2020   TRIG 104.0 08/11/2020   HDL 35.10 (L) 08/11/2020   LDLCALC 31 08/11/2020   ALT 24 12/15/2020   AST 22 12/15/2020   NA 142 12/15/2020   K 3.8 12/15/2020   CL 106 12/15/2020   CREATININE 0.80 12/15/2020   BUN 16 12/15/2020   CO2 27 12/15/2020   TSH 3.85 05/12/2020   HGBA1C 7.6 (H) 12/15/2020   MICROALBUR <0.7 12/22/2015    DG C-Arm 1-60 Min-No Report Fluoroscopy was utilized by the requesting physician.  No radiographic  interpretation.  CT RENAL STONE STUDY CLINICAL DATA:  Hematuria, unknown cause  EXAM: CT ABDOMEN AND PELVIS WITHOUT CONTRAST  TECHNIQUE: Multidetector CT imaging of the abdomen and pelvis was performed following the standard protocol without IV contrast.  COMPARISON:  CT abdomen/pelvis 09/14/2013  FINDINGS: Lower chest: There is dependent subsegmental atelectasis in the lung bases. There is a 7 mm nodule in the right lower lobe an  additional 7 mm nodule in the left base. Both of these nodules were present in 2015 but have minimally increased in size. The imaged heart is unremarkable. There is no pleural effusion.  Hepatobiliary: The liver is unremarkable. Foci of air within the gallbladder are likely due to gallstones. There is no evidence of acute cholecystitis. There is no biliary ductal dilatation.  Pancreas: Normal.  Spleen: Normal.  Adrenals/Urinary Tract: The adrenals are unremarkable.  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 Multiple additional hypodense lesions in the kidneys likely reflects cysts. There are multiple renal stones bilaterally measuring up to 1.7 cm on the left. There also two calculi in the left ureter measuring up to 5 mm distally. There is fullness of the left collecting system without frank hydronephrosis or hydroureter. An additional stone is noted within the left collecting system measuring 6 mm. No stones are seen along the course of the right ureter. Layering hyperdensity in the bladder may reflect additional passed stones. The bladder is otherwise unremarkable.  Stomach/Bowel: Postsurgical changes reflecting partial large bowel resection again seen. There is no evidence of local recurrence or complication at the anastomotic site.  The stomach is unremarkable. There is no evidence of bowel obstruction. There is no abnormal bowel wall thickening or inflammatory change.  Vascular/Lymphatic: There is minimal calcification in the bilateral common iliac arteries and thoracic aorta. There is no abdominal aortic aneurysm.  A prominent 1.4 cm portacaval lymph node is unchanged since 2015. A 1.9 cm lymph node along the right iliac chain has slightly enlarged since 2015, nonspecific. A 1.2 cm left iliac chain lymph nodes  is unchanged.  Reproductive: The prostate and seminal vesicles are unremarkable.  Other: There is no ascites or free  air.  Musculoskeletal: There is grade 1 anterolisthesis of L4 on L5, slightly increased since 2015. There is multilevel degenerative change of the lumbar spine with advanced facet arthropathy at L4-L5 and L5-S1. There is no acute osseous abnormality or aggressive osseous lesion.  IMPRESSION: 1. Two left ureteral stones measuring up to 5 mm as above and an additional 5 mm stone within the left collecting system; there is mild fullness of the left collecting system without frank hydronephrosis or hydroureter. 2. Multiple additional nonobstructing renal stones bilaterally measuring up to 1.7 cm on the left as above. 3. Cholelithiasis without evidence of acute cholecystitis. 4. 30m pulmonary nodules in both lower lobes described above. These nodules have been present since 2015 with minimal interval increase in size. Given minimal increase over 7 years, these nodules are favored to be benign. Recommend continued attention on follow-up studies.  Electronically Signed   By: PValetta MoleM.D.   On: 11/27/2020 08:31 CT HEAD WO CONTRAST (5MM) CLINICAL DATA:  Fall.  EXAM: CT HEAD WITHOUT CONTRAST  TECHNIQUE: Contiguous axial images were obtained from the base of the skull through the vertex without intravenous contrast.  COMPARISON:  None.  FINDINGS: Brain: No evidence of acute infarction, hemorrhage, hydrocephalus, extra-axial collection or mass lesion/mass effect. Mild generalized cerebral atrophy.  Vascular: Atherosclerotic vascular calcification of the carotid siphons. No hyperdense vessel.  Skull: Normal. Negative for fracture or focal lesion.  Sinuses/Orbits: No acute finding.  Other: None.  IMPRESSION: 1. No acute intracranial abnormality.  Electronically Signed   By: WTitus DubinM.D.   On: 11/27/2020 08:26 DG Chest Port 1 View CLINICAL DATA:  Fall  EXAM: PORTABLE CHEST 1 VIEW  COMPARISON:  11/25/2015  FINDINGS: Lungs are clear.  No pleural effusion  or pneumothorax.  The heart is top-normal in size.  IMPRESSION: No evidence of acute cardiopulmonary disease.  Electronically Signed   By: SJulian HyM.D.   On: 11/27/2020 03:51 DG Hand Complete Left CLINICAL DATA:  Fall x2 days, generalized pain  EXAM: LEFT HAND - COMPLETE 3+ VIEW  COMPARISON:  None.  FINDINGS: No fracture or dislocation is seen.  Prior ORIF of the distal radius, incompletely visualized.  The joint spaces are preserved.  Visualized soft tissues are within normal limits.  IMPRESSION: Negative.  Electronically Signed   By: SJulian HyM.D.   On: 11/27/2020 03:51 DG Hand Complete Right CLINICAL DATA:  Fall x2 days, generalized pain  EXAM: RIGHT HAND - COMPLETE 3+ VIEW  COMPARISON:  None.  FINDINGS: No fracture or dislocation is seen.  The joint spaces are preserved.  Visualized soft tissues are within normal limits.  IMPRESSION: Negative.  Electronically Signed   By: SJulian HyM.D.   On: 11/27/2020 03:50 DG Knee 1-2 Views Right CLINICAL DATA:  Fall x2 days, pain  EXAM: RIGHT KNEE - 1-2 VIEW  COMPARISON:  None.  FINDINGS: Lateral view is limited by obliquity.  No fracture or dislocation is seen.  Moderate tricompartmental degenerative changes, most prominent in the medial compartment. Lateral compartment chondrocalcinosis.  No definite suprapatellar knee joint effusion.  IMPRESSION: No fracture or dislocation is seen.  Moderate tricompartmental degenerative changes.  Electronically Signed   By: SJulian HyM.D.   On: 11/27/2020 03:50 DG Knee 1-2 Views Left CLINICAL DATA:  Fall x2 days, pain  EXAM: LEFT KNEE - 1-2 VIEW  COMPARISON:  None.  FINDINGS: Limited evaluation due to obliquity/difficulty with patient positioning.  No fracture or dislocation is seen.  Moderate tricompartmental degenerative changes, most prominent in the medial compartment.  Visualized soft tissues are within  normal limits.  No suprapatellar knee joint effusion.  IMPRESSION: No fracture or dislocation is seen.  Moderate tricompartmental degenerative changes.  Electronically Signed   By: Julian Hy M.D.   On: 11/27/2020 03:49   Assessment & Plan:    See Problem List for Assessment and Plan of chronic medical problems.    This visit occurred during the SARS-CoV-2 public health emergency.  Safety protocols were in place, including screening questions prior to the visit, additional usage of staff PPE, and extensive cleaning of exam room while observing appropriate contact time as indicated for disinfecting solutions.

## 2020-12-15 ENCOUNTER — Other Ambulatory Visit: Payer: Self-pay

## 2020-12-15 ENCOUNTER — Ambulatory Visit (INDEPENDENT_AMBULATORY_CARE_PROVIDER_SITE_OTHER): Payer: Medicare Other | Admitting: Internal Medicine

## 2020-12-15 VITALS — BP 126/80 | HR 100 | Temp 98.5°F

## 2020-12-15 DIAGNOSIS — N179 Acute kidney failure, unspecified: Secondary | ICD-10-CM | POA: Diagnosis not present

## 2020-12-15 DIAGNOSIS — I10 Essential (primary) hypertension: Secondary | ICD-10-CM

## 2020-12-15 DIAGNOSIS — Z794 Long term (current) use of insulin: Secondary | ICD-10-CM | POA: Diagnosis not present

## 2020-12-15 DIAGNOSIS — A419 Sepsis, unspecified organism: Secondary | ICD-10-CM | POA: Diagnosis not present

## 2020-12-15 DIAGNOSIS — R600 Localized edema: Secondary | ICD-10-CM | POA: Diagnosis not present

## 2020-12-15 DIAGNOSIS — E1142 Type 2 diabetes mellitus with diabetic polyneuropathy: Secondary | ICD-10-CM | POA: Diagnosis not present

## 2020-12-15 DIAGNOSIS — M170 Bilateral primary osteoarthritis of knee: Secondary | ICD-10-CM | POA: Diagnosis not present

## 2020-12-15 DIAGNOSIS — Z862 Personal history of diseases of the blood and blood-forming organs and certain disorders involving the immune mechanism: Secondary | ICD-10-CM | POA: Diagnosis not present

## 2020-12-15 DIAGNOSIS — N136 Pyonephrosis: Secondary | ICD-10-CM | POA: Diagnosis not present

## 2020-12-15 DIAGNOSIS — M6282 Rhabdomyolysis: Secondary | ICD-10-CM

## 2020-12-15 LAB — CBC WITH DIFFERENTIAL/PLATELET
Basophils Absolute: 0.1 10*3/uL (ref 0.0–0.1)
Basophils Relative: 1.1 % (ref 0.0–3.0)
Eosinophils Absolute: 0.3 10*3/uL (ref 0.0–0.7)
Eosinophils Relative: 4.5 % (ref 0.0–5.0)
HCT: 37.7 % — ABNORMAL LOW (ref 39.0–52.0)
Hemoglobin: 12.5 g/dL — ABNORMAL LOW (ref 13.0–17.0)
Lymphocytes Relative: 21.4 % (ref 12.0–46.0)
Lymphs Abs: 1.3 10*3/uL (ref 0.7–4.0)
MCHC: 33.2 g/dL (ref 30.0–36.0)
MCV: 83.4 fl (ref 78.0–100.0)
Monocytes Absolute: 0.6 10*3/uL (ref 0.1–1.0)
Monocytes Relative: 9.4 % (ref 3.0–12.0)
Neutro Abs: 3.9 10*3/uL (ref 1.4–7.7)
Neutrophils Relative %: 63.6 % (ref 43.0–77.0)
Platelets: 274 10*3/uL (ref 150.0–400.0)
RBC: 4.53 Mil/uL (ref 4.22–5.81)
RDW: 15 % (ref 11.5–15.5)
WBC: 6.1 10*3/uL (ref 4.0–10.5)

## 2020-12-15 LAB — HEMOGLOBIN A1C: Hgb A1c MFr Bld: 7.6 % — ABNORMAL HIGH (ref 4.6–6.5)

## 2020-12-15 LAB — MAGNESIUM: Magnesium: 1.7 mg/dL (ref 1.5–2.5)

## 2020-12-15 LAB — COMPREHENSIVE METABOLIC PANEL
ALT: 24 U/L (ref 0–53)
AST: 22 U/L (ref 0–37)
Albumin: 3.6 g/dL (ref 3.5–5.2)
Alkaline Phosphatase: 63 U/L (ref 39–117)
BUN: 16 mg/dL (ref 6–23)
CO2: 27 mEq/L (ref 19–32)
Calcium: 9 mg/dL (ref 8.4–10.5)
Chloride: 106 mEq/L (ref 96–112)
Creatinine, Ser: 0.8 mg/dL (ref 0.40–1.50)
GFR: 91.99 mL/min (ref >60.00)
Glucose, Bld: 98 mg/dL (ref 70–99)
Potassium: 3.8 mEq/L (ref 3.5–5.1)
Sodium: 142 mEq/L (ref 135–145)
Total Bilirubin: 0.5 mg/dL (ref 0.2–1.2)
Total Protein: 6.1 g/dL (ref 6.0–8.3)

## 2020-12-15 LAB — PHOSPHORUS: Phosphorus: 2.8 mg/dL (ref 2.3–4.6)

## 2020-12-16 ENCOUNTER — Other Ambulatory Visit: Payer: Self-pay | Admitting: Internal Medicine

## 2020-12-16 ENCOUNTER — Encounter: Payer: Self-pay | Admitting: Internal Medicine

## 2020-12-16 NOTE — Assessment & Plan Note (Signed)
Related to UTI/kidney stones Resolved in hospital Completed oral antibiotics Clinically doing well CBC, CMP today

## 2020-12-16 NOTE — Assessment & Plan Note (Signed)
Chronic Blood pressure well controlled here today Continue carvedilol 6.25 mg twice daily, ramipril 10 mg daily CMP

## 2020-12-16 NOTE — Assessment & Plan Note (Signed)
Related to fall at home and being on the floor for 3 hours Resolved in the hospital

## 2020-12-16 NOTE — Assessment & Plan Note (Signed)
Chronic Controlled and stable

## 2020-12-16 NOTE — Assessment & Plan Note (Signed)
Related to fall at home, developing rhabdomyolysis and sepsis Resolved in the hospital BUN/creatinine on discharge normal BMP today along with magnesium and phosphorus

## 2020-12-16 NOTE — Assessment & Plan Note (Signed)
Chronic Has not been compliant with a low-carb diet for years, but since hospitalization he has been compliant Sugars currently controlled at home Currently he is not taking any diabetic medications Will check A1c today, but this will not reflect his current sugars at home Okay to hold diabetic medications and monitor for now He is a follow-up visit with me at the end of next month and we will reevaluate Advised him to call if sugars become elevated so we can adjust medication

## 2020-12-19 DIAGNOSIS — N136 Pyonephrosis: Secondary | ICD-10-CM | POA: Diagnosis not present

## 2020-12-19 DIAGNOSIS — M170 Bilateral primary osteoarthritis of knee: Secondary | ICD-10-CM | POA: Diagnosis not present

## 2020-12-19 DIAGNOSIS — I10 Essential (primary) hypertension: Secondary | ICD-10-CM | POA: Diagnosis not present

## 2020-12-19 DIAGNOSIS — A419 Sepsis, unspecified organism: Secondary | ICD-10-CM | POA: Diagnosis not present

## 2020-12-19 DIAGNOSIS — E1142 Type 2 diabetes mellitus with diabetic polyneuropathy: Secondary | ICD-10-CM | POA: Diagnosis not present

## 2020-12-20 NOTE — Progress Notes (Signed)
Let voicemail for [facility] to request Dr. Tresa Moore co sign orders in epic.

## 2020-12-21 NOTE — Progress Notes (Signed)
DUE TO COVID-19 ONLY ONE VISITOR IS ALLOWED TO COME WITH YOU AND STAY IN THE WAITING ROOM ONLY DURING PRE OP AND PROCEDURE DAY OF SURGERY. THE 1 VISITOR  MAY VISIT WITH YOU AFTER SURGERY IN YOUR PRIVATE ROOM DURING VISITING HOURS ONLY!  YOU NEED TO HAVE A COVID 19 TEST ON_______ '@_______'$ , THIS TEST MUST BE DONE BEFORE SURGERY,  COVID TESTING SITE IS AT [facility]. PLEASE REMAIN IN YOUR CAR THIS IS A DRIVER UP TEST. AFTER YOUR COVID TEST PLEASE WEAR A MASK OUT IN PUBLIC AND SOCIAL DISTANCE AND [place] YOUR HANDS FREQUENTLY. PLEASE ASK ALL YOUR CLOSE CONTACTS TO WEAR A MASK OUT IN PUBLIC AND SOCIAL DISTANCE AND [place] HANDS FREQUENTLY ALSO.               Brandon Schaefer  12/21/2020   Your procedure is scheduled on:           01/10/21   Report to [facility] Main  Entrance   Report to admitting at    [phone]     Call this number if you have problems the morning of surgery [phone]    Remember: Do not eat food , candy gum or mints :After Midnight. You may have clear liquids from midnight until __ 0530am    CLEAR LIQUID DIET   Foods Allowed                                                                       Coffee and tea, regular and decaf                              Plain Jell-O any favor except red or purple                                            Fruit ices (not with fruit pulp)                                      Iced Popsicles                                     Carbonated beverages, regular and diet                                    Cranberry, grape and apple juices Sports drinks like Gatorade Lightly seasoned clear broth or consume(fat free) Sugar   _____________________________________________________________________    BRUSH YOUR TEETH MORNING OF SURGERY AND RINSE YOUR MOUTH OUT, NO CHEWING GUM CANDY OR MINTS.     Take these medicines the morning of surgery with A SIP OF WATER:  lyrica, cymbalta, coreg   DO NOT TAKE ANY DIABETIC  MEDICATIONS DAY OF YOUR SURGERY  You may not have any metal on your body including hair pins and              piercings  Do not wear jewelry, make-up, lotions, powders or perfumes, deodorant             Do not wear nail polish on your fingernails.  Do not shave  48 hours prior to surgery.              Men may shave face and neck.   Do not bring valuables to the hospital. [place].  Contacts, dentures or bridgework may not be worn into surgery.  Leave suitcase in the car. After surgery it may be brought to your room.     Patients discharged the day of surgery will not be allowed to drive home. IF YOU ARE HAVING SURGERY AND GOING HOME THE SAME DAY, YOU MUST HAVE AN ADULT TO DRIVE YOU HOME AND BE WITH YOU FOR 24 HOURS. YOU MAY GO HOME BY TAXI OR UBER OR ORTHERWISE, BUT AN ADULT MUST ACCOMPANY YOU HOME AND STAY WITH YOU FOR 24 HOURS.  Name and phone number of your driver:  Special Instructions: N/A              Please read over the following fact sheets you were given: _____________________________________________________________________  [facility] - Preparing for Surgery Before surgery, you can play an important role.  Because skin is not sterile, your skin needs to be as free of germs as possible.  You can reduce the number of germs on your skin by washing with CHG (chlorahexidine gluconate) soap before surgery.  CHG is an antiseptic cleaner which kills germs and bonds with the skin to continue killing germs even after washing. Please DO NOT use if you have an allergy to CHG or antibacterial soaps.  If your skin becomes reddened/irritated stop using the CHG and inform your nurse when you arrive at Short Stay. Do not shave (including legs and underarms) for at least 48 hours prior to the first CHG shower.  You may shave your face/neck. Please follow these instructions carefully:  1.  Shower with CHG Soap the night  before surgery and the  morning of Surgery.  2.  If you choose to wash your hair, wash your hair first as usual with your  normal  shampoo.  3.  After you shampoo, rinse your hair and body thoroughly to remove the  shampoo.                           4.  Use CHG as you would any other liquid soap.  You can apply chg directly  to the skin and wash                       Gently with a scrungie or clean washcloth.  5.  Apply the CHG Soap to your body ONLY FROM THE NECK DOWN.   Do not use on face/ open                           Wound or open sores. Avoid contact with eyes, ears mouth and genitals (private parts).  Wash face,  Genitals (private parts) with your normal soap.             6.  Wash thoroughly, paying special attention to the area where your surgery  will be performed.  7.  Thoroughly rinse your body with warm water from the neck down.  8.  DO NOT shower/wash with your normal soap after using and rinsing off  the CHG Soap.                9.  Pat yourself dry with a clean towel.            10.  Wear clean pajamas.            11.  Place clean sheets on your bed the night of your first shower and do not  sleep with pets. Day of Surgery : Do not apply any lotions/deodorants the morning of surgery.  Please wear clean clothes to the hospital/surgery center.  FAILURE TO FOLLOW THESE INSTRUCTIONS MAY RESULT IN THE CANCELLATION OF YOUR SURGERY PATIENT SIGNATURE_________________________________  NURSE SIGNATURE__________________________________  ________________________________________________________________________

## 2020-12-22 DIAGNOSIS — A419 Sepsis, unspecified organism: Secondary | ICD-10-CM | POA: Diagnosis not present

## 2020-12-22 DIAGNOSIS — N136 Pyonephrosis: Secondary | ICD-10-CM | POA: Diagnosis not present

## 2020-12-22 DIAGNOSIS — E1142 Type 2 diabetes mellitus with diabetic polyneuropathy: Secondary | ICD-10-CM | POA: Diagnosis not present

## 2020-12-22 DIAGNOSIS — I10 Essential (primary) hypertension: Secondary | ICD-10-CM | POA: Diagnosis not present

## 2020-12-22 DIAGNOSIS — M170 Bilateral primary osteoarthritis of knee: Secondary | ICD-10-CM | POA: Diagnosis not present

## 2020-12-25 DIAGNOSIS — F419 Anxiety disorder, unspecified: Secondary | ICD-10-CM

## 2020-12-25 DIAGNOSIS — M47817 Spondylosis without myelopathy or radiculopathy, lumbosacral region: Secondary | ICD-10-CM | POA: Diagnosis not present

## 2020-12-25 DIAGNOSIS — R918 Other nonspecific abnormal finding of lung field: Secondary | ICD-10-CM

## 2020-12-25 DIAGNOSIS — Z7984 Long term (current) use of oral hypoglycemic drugs: Secondary | ICD-10-CM

## 2020-12-25 DIAGNOSIS — Z6841 Body Mass Index (BMI) 40.0 and over, adult: Secondary | ICD-10-CM

## 2020-12-25 DIAGNOSIS — E1142 Type 2 diabetes mellitus with diabetic polyneuropathy: Secondary | ICD-10-CM | POA: Diagnosis not present

## 2020-12-25 DIAGNOSIS — M4316 Spondylolisthesis, lumbar region: Secondary | ICD-10-CM | POA: Diagnosis not present

## 2020-12-25 DIAGNOSIS — A419 Sepsis, unspecified organism: Secondary | ICD-10-CM | POA: Diagnosis not present

## 2020-12-25 DIAGNOSIS — M47816 Spondylosis without myelopathy or radiculopathy, lumbar region: Secondary | ICD-10-CM | POA: Diagnosis not present

## 2020-12-25 DIAGNOSIS — Z85038 Personal history of other malignant neoplasm of large intestine: Secondary | ICD-10-CM

## 2020-12-25 DIAGNOSIS — E538 Deficiency of other specified B group vitamins: Secondary | ICD-10-CM | POA: Diagnosis not present

## 2020-12-25 DIAGNOSIS — D649 Anemia, unspecified: Secondary | ICD-10-CM | POA: Diagnosis not present

## 2020-12-25 DIAGNOSIS — Z794 Long term (current) use of insulin: Secondary | ICD-10-CM

## 2020-12-25 DIAGNOSIS — E785 Hyperlipidemia, unspecified: Secondary | ICD-10-CM | POA: Diagnosis not present

## 2020-12-25 DIAGNOSIS — Z791 Long term (current) use of non-steroidal anti-inflammatories (NSAID): Secondary | ICD-10-CM

## 2020-12-25 DIAGNOSIS — N136 Pyonephrosis: Secondary | ICD-10-CM | POA: Diagnosis not present

## 2020-12-25 DIAGNOSIS — N200 Calculus of kidney: Secondary | ICD-10-CM | POA: Diagnosis not present

## 2020-12-25 DIAGNOSIS — K8020 Calculus of gallbladder without cholecystitis without obstruction: Secondary | ICD-10-CM

## 2020-12-25 DIAGNOSIS — Z7982 Long term (current) use of aspirin: Secondary | ICD-10-CM

## 2020-12-25 DIAGNOSIS — M170 Bilateral primary osteoarthritis of knee: Secondary | ICD-10-CM | POA: Diagnosis not present

## 2020-12-25 DIAGNOSIS — J9811 Atelectasis: Secondary | ICD-10-CM

## 2020-12-25 DIAGNOSIS — I10 Essential (primary) hypertension: Secondary | ICD-10-CM | POA: Diagnosis not present

## 2020-12-25 DIAGNOSIS — Z9181 History of falling: Secondary | ICD-10-CM

## 2020-12-26 ENCOUNTER — Encounter (HOSPITAL_COMMUNITY): Payer: Self-pay

## 2020-12-26 ENCOUNTER — Encounter (HOSPITAL_COMMUNITY)
Admit: 2020-12-26 | Discharge: 2020-12-26 | Disposition: A | Discharge status: Home / Self Care | Payer: Medicare Other | Source: Ambulatory Visit | Attending: Urology | Admitting: Urology

## 2020-12-26 ENCOUNTER — Other Ambulatory Visit: Payer: Self-pay

## 2020-12-26 DIAGNOSIS — N200 Calculus of kidney: Secondary | ICD-10-CM | POA: Insufficient documentation

## 2020-12-26 DIAGNOSIS — Z01818 Encounter for other preprocedural examination: Secondary | ICD-10-CM | POA: Insufficient documentation

## 2020-12-26 HISTORY — DX: Personal history of urinary calculi: Z87.442

## 2020-12-26 LAB — BASIC METABOLIC PANEL
Anion gap: 9 (ref 5–15)
BUN: 20 mg/dL (ref 8–23)
CO2: 25 mmol/L (ref 22–32)
Calcium: 9 mg/dL (ref 8.9–10.3)
Chloride: 106 mmol/L (ref 98–111)
Creatinine, Ser: 0.83 mg/dL (ref 0.61–1.24)
GFR, Estimated: >60 mL/min (ref >60)
Glucose, Bld: 142 mg/dL — ABNORMAL HIGH (ref 70–99)
Potassium: 4.3 mmol/L (ref 3.5–5.1)
Sodium: 140 mmol/L (ref 135–145)

## 2020-12-26 LAB — CBC
HCT: 40.2 % (ref 39.0–52.0)
Hemoglobin: 13.2 g/dL (ref 13.0–17.0)
MCH: 28.2 pg (ref 26.0–34.0)
MCHC: 32.8 g/dL (ref 30.0–36.0)
MCV: 85.9 fL (ref 80.0–100.0)
Platelets: 209 10*3/uL (ref 150–400)
RBC: 4.68 MIL/uL (ref 4.22–5.81)
RDW: 14.6 % (ref 11.5–15.5)
WBC: 6.8 10*3/uL (ref 4.0–10.5)
nRBC: 0 % (ref 0.0–0.2)

## 2020-12-26 LAB — GLUCOSE, CAPILLARY: Glucose-Capillary: 145 mg/dL — ABNORMAL HIGH (ref 70–99)

## 2020-12-26 NOTE — Progress Notes (Addendum)
Anesthesia Review:  PCP: Billey Gosling LOV 12/15/2020  Cardiologist : Chest x-ray : EKG : 12/26/2020  Echo : 2017  Stress test: Cardiac Cath :  Activity level: pt cannot  climb a flight of stairs without difficulty Sleep Study/ CPAP : none  Fasting Blood Sugar :      / Checks Blood Sugar -- times a day:   Blood Thinner/ Instructions /Last Dose: ASA / Instructions/ Last Dose :   Dm- type 2 PT has Freestyle Libre   Hgba1c-7.5 on 12/15/20  No orders in at preop  requested x 2 on 9/7 and 12/21/2020  No covid test- ambulatory surgery  Last cysto- 11/27/20  EKG was ordered at preop.  No EKG done.  Will be done day of surgery.  12/26/20 Called for 3rd time to request orders be placed in epic.  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

## 2020-12-27 DIAGNOSIS — N136 Pyonephrosis: Secondary | ICD-10-CM | POA: Diagnosis not present

## 2020-12-27 DIAGNOSIS — I10 Essential (primary) hypertension: Secondary | ICD-10-CM | POA: Diagnosis not present

## 2020-12-27 DIAGNOSIS — M170 Bilateral primary osteoarthritis of knee: Secondary | ICD-10-CM | POA: Diagnosis not present

## 2020-12-27 DIAGNOSIS — A419 Sepsis, unspecified organism: Secondary | ICD-10-CM | POA: Diagnosis not present

## 2020-12-27 DIAGNOSIS — E1142 Type 2 diabetes mellitus with diabetic polyneuropathy: Secondary | ICD-10-CM | POA: Diagnosis not present

## 2021-01-01 ENCOUNTER — Other Ambulatory Visit: Payer: Self-pay | Admitting: Internal Medicine

## 2021-01-01 NOTE — Progress Notes (Signed)
Anesthesia Chart Review   Case: Brandon Schaefer Date/Time: 01/10/21 0815   Procedures:      CYSTOSCOPY WITH RETROGRADE PYELOGRAM, URETEROSCOPY AND STENT EXCHANGE (Bilateral)     HOLMIUM LASER APPLICATION (Bilateral)   Anesthesia type: General   Pre-op diagnosis: BILATERAL RENAL STONES   Location: [place] / [facility]   Surgeons: Alexis Frock, MD       DISCUSSION:65 y.o. never smoker with h/o HTN, DM II, colon cancer status post partial colectomy in 2013, bilateral renal stones scheduled for above procedure 01/10/2021 with Dr. Alexis Frock.   Recent hospitalization 8/14-8/24/2022 with sepsis secondary to UTI. S/p cystoscopy 11/27/2020 with no anesthesia complications noted. Discharged home with home health PT/OT/SW.   Pt had f/u with PCP post hospitalization 12/15/20.   Anticipate pt can proceed with planned procedure barring acute status change and after evaluation DOS.  VS: BP (!) 142/68   Pulse 76   Temp 36.8 C (Oral)   Resp 16   Ht 6' (1.829 m)   SpO2 97%   BMI 52.89 kg/m   PROVIDERS: Binnie Rail, MD is PCP    LABS: Labs reviewed: Acceptable for surgery. (all labs ordered are listed, but only abnormal results are displayed)  Labs Reviewed  BASIC METABOLIC PANEL - Abnormal; Notable for the following components:      Result Value   Glucose, Bld 142 (*)    All other components within normal limits  GLUCOSE, CAPILLARY - Abnormal; Notable for the following components:   Glucose-Capillary 145 (*)    All other components within normal limits  CBC     IMAGES:   EKG:   CV: Echo 11/26/2015 - Left ventricle: The cavity size was normal. There was moderate    concentric hypertrophy. Systolic function was normal. The    estimated ejection fraction was in the range of 55% to 60%. Wall    motion was normal; there were no regional wall motion    abnormalities. The study is not technically sufficient to allow    evaluation of LV diastolic function.  - Aortic valve:  Transvalvular velocity was within the normal range.    There was no stenosis. There was no regurgitation.  - Mitral valve: Transvalvular velocity was within the normal range.    There was no evidence for stenosis. There was no regurgitation.  - Right ventricle: The cavity size was normal. Wall thickness was    normal. Systolic function was normal.  - Atrial septum: No defect or patent foramen ovale was identified    by color flow Doppler.  - Tricuspid valve: There was no regurgitation.  Past Medical History:  Diagnosis Date   Allergy    Anemia    Anxiety    colon ca dx'd 11/2011   Dental abscess 08/03/2015   Diabetes mellitus    Headache(784.0)    History of blood transfusion    History of kidney stones    Hyperlipidemia    Hypertension    Neuromuscular disorder ([place])    peripheral neuropathy   Shortness of breath    with exertion    Past Surgical History:  Procedure Laterality Date   COMPLEX WOUND CLOSURE N/A 01/06/2013   Procedure: EXCISION CHRONIC ABDOMINAL WOUND;  Surgeon: Harl Bowie, MD;  Location: [facility];  Service: General;  Laterality: N/A;   CYSTOSCOPY W/ URETERAL STENT PLACEMENT Bilateral 01/04/2013   Procedure: CYSTOSCOPY WITH RETROGRADE PYELOGRAM/Right double J URETERAL STENT PLACEMENT;  Surgeon: Alexis Frock, MD;  Location: [facility];  Service: Urology;  Laterality: Bilateral;   CYSTOSCOPY W/ URETERAL STENT PLACEMENT Left 11/27/2020   Procedure: CYSTOSCOPY WITH RETROGRADE PYELOGRAM/URETERAL STENT PLACEMENT;  Surgeon: Ceasar Mons, MD;  Location: [facility];  Service: Urology;  Laterality: Left;   CYSTOSCOPY WITH RETROGRADE PYELOGRAM, URETEROSCOPY AND STENT PLACEMENT Right 01/06/2013   Procedure: CYSTOSCOPY WITH RIGHT RETROGRADE PYELOGRAM, URETEROSCOPY AND BILATERAL STENT EXCHANGE, BILATERAL STONE BASKET EXTRACTION ;  Surgeon: Alexis Frock, MD;  Location: [facility];  Service: Urology;  Laterality: Right;   FRACTURE SURGERY      ORIF-left radius has pin    HOLMIUM LASER APPLICATION N/A [id number]   Procedure: HOLMIUM LASER APPLICATION;  Surgeon: Alexis Frock, MD;  Location: [facility];  Service: Urology;  Laterality: N/A;   NEPHROLITHOTOMY Left 01/04/2013   Procedure: NEPHROLITHOTOMY PERCUTANEOUS  LEFT 1ST STAGE PERCUTANEOUS NEPHROSTOLITHOTOMY;  Surgeon: Alexis Frock, MD;  Location: [facility];  Service: Urology;  Laterality: Left;   NEPHROLITHOTOMY Left 01/06/2013   Procedure: NEPHROLITHOTOMY PERCUTANEOUS SECOND LOOK;  Surgeon: Alexis Frock, MD;  Location: [facility];  Service: Urology;  Laterality: Left;   PARTIAL COLECTOMY  01/17/2012   Procedure: PARTIAL COLECTOMY;  Surgeon: Harl Bowie, MD;  Location: [facility];  Service: General;;   PILONIDAL CYST EXCISION     PORT-A-CATH REMOVAL Left 01/06/2013   Procedure: REMOVAL PORT-A-CATH;  Surgeon: Harl Bowie, MD;  Location: [facility];  Service: General;  Laterality: Left;   PORTACATH PLACEMENT  03/19/2012   Procedure: INSERTION PORT-A-CATH;  Surgeon: Harl Bowie, MD;  Location: [place];  Service: General;  Laterality: N/A;   PROCTOSCOPY  01/17/2012   Procedure: PROCTOSCOPY;  Surgeon: Harl Bowie, MD;  Location: [facility];  Service: General;;    MEDICATIONS:  aspirin EC 81 MG tablet   carvedilol (COREG) 6.25 MG tablet   Continuous Blood Gluc Receiver (FREESTYLE LIBRE 2 READER) DEVI   Continuous Blood Gluc Sensor (FREESTYLE LIBRE 14 DAY SENSOR) MISC   CONTOUR NEXT TEST test strip   DULoxetine (CYMBALTA) 30 MG capsule   glipiZIDE (GLUCOTROL) 10 MG tablet   insulin lispro protamine-lispro (HUMALOG MIX 75/25) (75-25) 100 UNIT/ML SUSP injection   Insulin Syringe-Needle U-100 (INSULIN SYRINGE 1CC/31GX5/16") 31G X 5/16" 1 ML MISC   meloxicam (MOBIC) 15 MG tablet   metFORMIN (GLUCOPHAGE) 1000 MG tablet   pregabalin (LYRICA) 100 MG capsule   ramipril (ALTACE) 10 MG capsule   rosuvastatin (CRESTOR) 20 MG tablet   Semaglutide (RYBELSUS) 7 MG TABS   tamsulosin (FLOMAX) 0.4 MG  CAPS capsule   No current facility-administered medications for this encounter.     Konrad Felix Ward, PA-C [facility] [phone]

## 2021-01-05 ENCOUNTER — Encounter: Payer: Self-pay | Admitting: Internal Medicine

## 2021-01-08 DIAGNOSIS — E119 Type 2 diabetes mellitus without complications: Secondary | ICD-10-CM | POA: Diagnosis not present

## 2021-01-08 DIAGNOSIS — Z794 Long term (current) use of insulin: Secondary | ICD-10-CM | POA: Diagnosis not present

## 2021-01-09 ENCOUNTER — Encounter (HOSPITAL_COMMUNITY): Payer: Self-pay | Admitting: Urology

## 2021-01-09 NOTE — Anesthesia Preprocedure Evaluation (Addendum)
Anesthesia Evaluation  Patient identified by MRN, date of birth, ID band Patient awake    Reviewed: Allergy & Precautions, NPO status , Patient's Chart, lab work & pertinent test results, reviewed documented beta blocker date and time   History of Anesthesia Complications Negative for: history of anesthetic complications  Airway Mallampati: II  TM Distance: >3 FB Neck ROM: Full    Dental  (+) Missing,    Pulmonary neg pulmonary ROS,    Pulmonary exam normal        Cardiovascular hypertension, Pt. on medications and Pt. on home beta blockers Normal cardiovascular exam  Echo 11/26/2015: moderate LVH, EF 55-60%, valves ok   Neuro/Psych Anxiety negative neurological ROS     GI/Hepatic Neg liver ROS, colon cancer status post partial colectomy in 2013   Endo/Other  diabetes, Type 2, Oral Hypoglycemic Agents, Insulin DependentMorbid obesity (BMI 51)  Renal/GU Renal stones  negative genitourinary   Musculoskeletal  (+) Arthritis ,   Abdominal   Peds  Hematology negative hematology ROS (+)   Anesthesia Other Findings Day of surgery medications reviewed with patient.  Reproductive/Obstetrics negative OB ROS                            Anesthesia Physical Anesthesia Plan  ASA: 3  Anesthesia Plan: General   Post-op Pain Management:    Induction: Intravenous  PONV Risk Score and Plan: 2 and Treatment may vary due to age or medical condition, Ondansetron, Dexamethasone and Midazolam  Airway Management Planned: LMA  Additional Equipment: None  Intra-op Plan:   Post-operative Plan: Extubation in OR  Informed Consent: I have reviewed the patients History and Physical, chart, labs and discussed the procedure including the risks, benefits and alternatives for the proposed anesthesia with the patient or authorized representative who has indicated his/her understanding and acceptance.      Dental advisory given  Plan Discussed with: CRNA  Anesthesia Plan Comments:        Anesthesia Quick Evaluation

## 2021-01-10 ENCOUNTER — Encounter (HOSPITAL_COMMUNITY): Payer: Self-pay | Admitting: Urology

## 2021-01-10 ENCOUNTER — Ambulatory Visit (HOSPITAL_COMMUNITY)
Admission: RE | Admit: 2021-01-10 | Discharge: 2021-01-10 | Disposition: A | Discharge status: Home / Self Care | Payer: Medicare Other | Attending: Urology | Admitting: Urology

## 2021-01-10 ENCOUNTER — Ambulatory Visit (HOSPITAL_COMMUNITY): Payer: Medicare Other | Admitting: Physician Assistant

## 2021-01-10 ENCOUNTER — Ambulatory Visit (HOSPITAL_COMMUNITY): Payer: Medicare Other | Admitting: Anesthesiology

## 2021-01-10 ENCOUNTER — Encounter (HOSPITAL_COMMUNITY)
Admission: RE | Disposition: A | Discharge status: Home / Self Care | Payer: Self-pay | Source: Home / Self Care | Attending: Urology

## 2021-01-10 ENCOUNTER — Ambulatory Visit (HOSPITAL_COMMUNITY): Payer: Medicare Other

## 2021-01-10 DIAGNOSIS — Z7984 Long term (current) use of oral hypoglycemic drugs: Secondary | ICD-10-CM | POA: Diagnosis not present

## 2021-01-10 DIAGNOSIS — Z800 Family history of malignant neoplasm of digestive organs: Secondary | ICD-10-CM | POA: Insufficient documentation

## 2021-01-10 DIAGNOSIS — D509 Iron deficiency anemia, unspecified: Secondary | ICD-10-CM | POA: Diagnosis not present

## 2021-01-10 DIAGNOSIS — I10 Essential (primary) hypertension: Secondary | ICD-10-CM | POA: Diagnosis not present

## 2021-01-10 DIAGNOSIS — Z833 Family history of diabetes mellitus: Secondary | ICD-10-CM | POA: Insufficient documentation

## 2021-01-10 DIAGNOSIS — Z9221 Personal history of antineoplastic chemotherapy: Secondary | ICD-10-CM | POA: Insufficient documentation

## 2021-01-10 DIAGNOSIS — Z888 Allergy status to other drugs, medicaments and biological substances status: Secondary | ICD-10-CM | POA: Diagnosis not present

## 2021-01-10 DIAGNOSIS — N202 Calculus of kidney with calculus of ureter: Secondary | ICD-10-CM | POA: Diagnosis not present

## 2021-01-10 DIAGNOSIS — Z85038 Personal history of other malignant neoplasm of large intestine: Secondary | ICD-10-CM | POA: Insufficient documentation

## 2021-01-10 DIAGNOSIS — E119 Type 2 diabetes mellitus without complications: Secondary | ICD-10-CM | POA: Diagnosis not present

## 2021-01-10 DIAGNOSIS — Z6841 Body Mass Index (BMI) 40.0 and over, adult: Secondary | ICD-10-CM | POA: Insufficient documentation

## 2021-01-10 DIAGNOSIS — Z9049 Acquired absence of other specified parts of digestive tract: Secondary | ICD-10-CM | POA: Diagnosis not present

## 2021-01-10 DIAGNOSIS — N200 Calculus of kidney: Secondary | ICD-10-CM | POA: Diagnosis not present

## 2021-01-10 DIAGNOSIS — E785 Hyperlipidemia, unspecified: Secondary | ICD-10-CM | POA: Diagnosis not present

## 2021-01-10 HISTORY — PX: CYSTOSCOPY WITH RETROGRADE PYELOGRAM, URETEROSCOPY AND STENT PLACEMENT: SHX5789

## 2021-01-10 HISTORY — PX: HOLMIUM LASER APPLICATION: SHX5852

## 2021-01-10 LAB — GLUCOSE, CAPILLARY
Glucose-Capillary: 221 mg/dL — ABNORMAL HIGH (ref 70–99)
Glucose-Capillary: 199 mg/dL — ABNORMAL HIGH (ref 70–99)
Glucose-Capillary: 189 mg/dL — ABNORMAL HIGH (ref 70–99)

## 2021-01-10 SURGERY — CYSTOURETEROSCOPY, WITH RETROGRADE PYELOGRAM AND STENT INSERTION
Anesthesia: General | Laterality: Bilateral

## 2021-01-10 MED ORDER — PROPOFOL 10 MG/ML IV BOLUS
INTRAVENOUS | Status: DC | PRN
  Administered 2021-01-10: 200 mg via INTRAVENOUS

## 2021-01-10 MED ORDER — OXYCODONE-ACETAMINOPHEN 5-325 MG PO TABS: 1.0000 | ORAL_TABLET | Freq: Four times a day (QID) | ORAL | 0 refills | Status: DC | PRN

## 2021-01-10 MED ORDER — INSULIN ASPART 100 UNIT/ML IJ SOLN
6.0000 [IU] | Freq: Once | INTRAMUSCULAR | Status: AC
  Administered 2021-01-10: 6 [IU] via SUBCUTANEOUS

## 2021-01-10 MED ORDER — DEXAMETHASONE SODIUM PHOSPHATE 10 MG/ML IJ SOLN
INTRAMUSCULAR | Status: AC
  Filled 2021-01-10: qty 2

## 2021-01-10 MED ORDER — DEXMEDETOMIDINE (PRECEDEX) IN NS 20 MCG/5ML (4 MCG/ML) IV SYRINGE
PREFILLED_SYRINGE | INTRAVENOUS | Status: AC
  Filled 2021-01-10: qty 5

## 2021-01-10 MED ORDER — OXYCODONE HCL 5 MG/5ML PO SOLN
5.0000 mg | Freq: Once | ORAL | Status: AC | PRN
Start: 2021-01-10 — End: 2021-01-10

## 2021-01-10 MED ORDER — ORAL CARE MOUTH RINSE: 15.0000 mL | Freq: Once | OROMUCOSAL | Status: AC

## 2021-01-10 MED ORDER — CEFAZOLIN IN SODIUM CHLORIDE 3-0.9 GM/100ML-% IV SOLN
3.0000 g | Freq: Once | INTRAVENOUS | Status: AC
  Administered 2021-01-10: 3 g via INTRAVENOUS

## 2021-01-10 MED ORDER — LIDOCAINE 2% (20 MG/ML) 5 ML SYRINGE
INTRAMUSCULAR | Status: DC | PRN
Start: 2021-01-10 — End: 2021-01-10
  Administered 2021-01-10: 100 mg via INTRAVENOUS

## 2021-01-10 MED ORDER — DEXMEDETOMIDINE (PRECEDEX) IN NS 20 MCG/5ML (4 MCG/ML) IV SYRINGE
PREFILLED_SYRINGE | INTRAVENOUS | Status: DC | PRN
  Administered 2021-01-10: 8 ug via INTRAVENOUS
  Administered 2021-01-10 (×2): 6 ug via INTRAVENOUS

## 2021-01-10 MED ORDER — FENTANYL CITRATE (PF) 100 MCG/2ML IJ SOLN
INTRAMUSCULAR | Status: AC
  Filled 2021-01-10: qty 2

## 2021-01-10 MED ORDER — ONDANSETRON HCL 4 MG/2ML IJ SOLN
INTRAMUSCULAR | Status: DC | PRN
  Administered 2021-01-10: 4 mg via INTRAVENOUS

## 2021-01-10 MED ORDER — IOHEXOL 300 MG/ML  SOLN
INTRAMUSCULAR | Status: DC | PRN
  Administered 2021-01-10: 35 mL via URETHRAL

## 2021-01-10 MED ORDER — PROPOFOL 10 MG/ML IV BOLUS
INTRAVENOUS | Status: AC
  Filled 2021-01-10: qty 40

## 2021-01-10 MED ORDER — MIDAZOLAM HCL 5 MG/5ML IJ SOLN
INTRAMUSCULAR | Status: DC | PRN
  Administered 2021-01-10: 2 mg via INTRAVENOUS

## 2021-01-10 MED ORDER — ONDANSETRON HCL 4 MG/2ML IJ SOLN
INTRAMUSCULAR | Status: AC
  Filled 2021-01-10: qty 4

## 2021-01-10 MED ORDER — PROPOFOL 500 MG/50ML IV EMUL
INTRAVENOUS | Status: AC
  Filled 2021-01-10: qty 50

## 2021-01-10 MED ORDER — LIDOCAINE HCL (PF) 2 % IJ SOLN
INTRAMUSCULAR | Status: AC
  Filled 2021-01-10: qty 10

## 2021-01-10 MED ORDER — LACTATED RINGERS IV SOLN: INTRAVENOUS | Status: DC

## 2021-01-10 MED ORDER — OXYCODONE HCL 5 MG PO TABS: 5.0000 mg | ORAL_TABLET | Freq: Once | ORAL | Status: AC | PRN

## 2021-01-10 MED ORDER — MIDAZOLAM HCL 2 MG/2ML IJ SOLN
INTRAMUSCULAR | Status: AC
  Filled 2021-01-10: qty 2

## 2021-01-10 MED ORDER — ACETAMINOPHEN 500 MG PO TABS
1000.0000 mg | ORAL_TABLET | Freq: Once | ORAL | Status: AC
  Administered 2021-01-10: 1000 mg via ORAL
  Filled 2021-01-10: qty 2

## 2021-01-10 MED ORDER — CEFAZOLIN IN SODIUM CHLORIDE 3-0.9 GM/100ML-% IV SOLN
INTRAVENOUS | Status: AC
  Filled 2021-01-10: qty 100

## 2021-01-10 MED ORDER — CHLORHEXIDINE GLUCONATE 0.12 % MT SOLN
15.0000 mL | Freq: Once | OROMUCOSAL | Status: AC
  Administered 2021-01-10: 15 mL via OROMUCOSAL

## 2021-01-10 MED ORDER — FENTANYL CITRATE PF 50 MCG/ML IJ SOSY
PREFILLED_SYRINGE | INTRAMUSCULAR | Status: AC
  Administered 2021-01-10: 25 ug via INTRAVENOUS
  Filled 2021-01-10: qty 1

## 2021-01-10 MED ORDER — INSULIN ASPART 100 UNIT/ML IJ SOLN
INTRAMUSCULAR | Status: AC
  Filled 2021-01-10: qty 1

## 2021-01-10 MED ORDER — FENTANYL CITRATE (PF) 100 MCG/2ML IJ SOLN
INTRAMUSCULAR | Status: DC | PRN
  Administered 2021-01-10: 50 ug via INTRAVENOUS
  Administered 2021-01-10 (×2): 25 ug via INTRAVENOUS
  Administered 2021-01-10 (×2): 50 ug via INTRAVENOUS
  Administered 2021-01-10: 25 ug via INTRAVENOUS

## 2021-01-10 MED ORDER — SODIUM CHLORIDE 0.9 % IR SOLN
Status: DC | PRN
  Administered 2021-01-10: 3000 mL via INTRAVESICAL

## 2021-01-10 MED ORDER — KETOROLAC TROMETHAMINE 10 MG PO TABS: 10.0000 mg | ORAL_TABLET | Freq: Three times a day (TID) | ORAL | 0 refills | Status: DC | PRN

## 2021-01-10 MED ORDER — OXYCODONE HCL 5 MG PO TABS
ORAL_TABLET | ORAL | Status: AC
  Administered 2021-01-10: 5 mg via ORAL
  Filled 2021-01-10: qty 1

## 2021-01-10 MED ORDER — SENNOSIDES-DOCUSATE SODIUM 8.6-50 MG PO TABS: 1.0000 | ORAL_TABLET | Freq: Two times a day (BID) | ORAL | 0 refills | Status: DC

## 2021-01-10 MED ORDER — FENTANYL CITRATE PF 50 MCG/ML IJ SOSY
25.0000 ug | PREFILLED_SYRINGE | INTRAMUSCULAR | Status: DC | PRN
  Administered 2021-01-10: 25 ug via INTRAVENOUS

## 2021-01-10 SURGICAL SUPPLY — 24 items
BAG URO CATCHER STRL LF (MISCELLANEOUS) ×2 IMPLANT
BASKET LASER NITINOL 1.9FR (BASKET) IMPLANT
CATH INTERMIT  6FR 70CM (CATHETERS) ×2 IMPLANT
CLOTH BEACON ORANGE TIMEOUT ST (SAFETY) ×2 IMPLANT
EXTRACTOR STONE 1.7FRX115CM (UROLOGICAL SUPPLIES) IMPLANT
GLOVE SURG ENC TEXT LTX SZ7.5 (GLOVE) ×6 IMPLANT
GOWN STRL REUS W/TWL LRG LVL3 (GOWN DISPOSABLE) ×4 IMPLANT
GUIDEWIRE ANG ZIPWIRE 038X150 (WIRE) ×4 IMPLANT
GUIDEWIRE COONS BENTSON MOVE (WIRE) ×4 IMPLANT
GUIDEWIRE STR DUAL SENSOR (WIRE) IMPLANT
KIT TURNOVER KIT A (KITS) ×2 IMPLANT
LASER FIB FLEXIVA PULSE ID 365 (Laser) IMPLANT
LASER FIB FLEXIVA PULSE ID 550 (Laser) IMPLANT
LASER FIB FLEXIVA PULSE ID 910 (Laser) IMPLANT
MANIFOLD NEPTUNE II (INSTRUMENTS) ×2 IMPLANT
PACK CYSTO (CUSTOM PROCEDURE TRAY) ×2 IMPLANT
SHEATH URETERAL 12FRX28CM (UROLOGICAL SUPPLIES) ×2 IMPLANT
SHEATH URETERAL 12FRX35CM (MISCELLANEOUS) IMPLANT
STENT POLARIS 5FRX26 (STENTS) ×4 IMPLANT
TRACTIP FLEXIVA PULS ID 200XHI (Laser) ×1 IMPLANT
TRACTIP FLEXIVA PULSE ID 200 (Laser) ×1
TUBE FEEDING 8FR 16IN STR KANG (MISCELLANEOUS) ×2 IMPLANT
TUBING CONNECTING 10 (TUBING) ×2 IMPLANT
TUBING UROLOGY SET (TUBING) ×2 IMPLANT

## 2021-01-10 NOTE — Op Note (Signed)
NAME: Brandon Schaefer, Brandon Schaefer MEDICAL RECORD NO: [id number] ACCOUNT NO: [id number] DATE OF BIRTH: Apr 29, 1955 FACILITY: [facility] LOCATION: [facility] PHYSICIAN: Alexis Frock, MD  Operative Report   DATE OF PROCEDURE: 01/10/2021  PREOPERATIVE DIAGNOSIS:  Left ureteral and bilateral renal stones, large volume, recurrent.  PROCEDURE PERFORMED: 1.  Cystoscopy, bilateral retrograde pyelograms interpretation. 2.  Bilateral ureteroscopy with laser lithotripsy, first stage. 3.  Placement of bilateral ureteral stents.  ESTIMATED BLOOD LOSS:  Nil.  COMPLICATIONS:  None.  SPECIMENS:  None.  FINDINGS:  1.  Left proximal ureteral stone. 2.  Very large volume, left greater than right bilateral renal stones. 3.  Estimated ablation of approximately 70% stone burden today. 4.  Successful placement of bilateral ureteral stents, proximal end in renal pelvis, distal end in urinary bladder.  INDICATIONS FOR PROCEDURE:  The patient is a pleasant, very comorbid 65 year old male with history of morbid obesity, functional decline and recurrent urolithiasis and variable medical compliance.  He was found on workup of colicky flank pain to have a  recurrent left ureteral and very large volume bilateral renal stones.  He underwent stenting as a temporizing measure several weeks ago.  He presents today for definitive stone management with bilateral ureteroscopy, possibly staged.  Informed consent  was obtained and placed in medical record.  DESCRIPTION OF PROCEDURE:  The patient being verified, procedure being bilateral ureteroscopic stone manipulation was confirmed.  Procedure timeout was performed.  Intravenous antibiotics were administered and general LMA anesthesia induced.  The patient  was placed into a low lithotomy position after his very large pannus was taped out of the way.  A sterile field was created, prepped and draped the patient's penis, perineum and proximal thighs using iodine.  Cystourethroscopy was  performed using a  21-French rigid cystoscope with offset lens.  Inspection of the anterior and posterior urethra revealed a very high riding bladder neck.  The urinary bladder revealed the distal end of the left stent, it was grasped and brought to the level of the  urethral meatus.  It was fairly encrusted, such that it could not be wire accessed.  It was then removed and set aside for discard, inspected and intact.  Left ureteral orifice was cannulated with 6-French end-hole catheter and left retrograde pyelogram  was obtained.  Left retrograde pyelogram demonstrated single left ureter, single system left kidney.  There was a very large filling defect consistent with a large renal pelvis stone.  A ZIPwire was advanced to the level of the upper pole, set aside as a safety wire.   Next, right retrograde pyelogram was obtained.  Right retrograde pyelogram demonstrated single right ureter, single system right kidney. No filling defects or narrowing noted.  A separate ZIPwire was advanced to the level of upper pole, set aside as a safety wire.  An 8-French feeding tube placed in  the urinary bladder for pressure release.  Semi-rigid ureteroscopy was performed of the distal right ureter alongside a separate sensor working wire.  Given his very high riding bladder neck, angulation was quite poor and only the distal third of the  ureter could be inspected with this technique.  Next, semirigid ureteroscopy was performed to the distal one-third of the left ureter alongside a separate working wire.  Similarly due to unfavorable angulation, approximately just the distal third of the  ureter could be inspected with the technique.  Semirigid scope was then exchanged for a short length ureteral access sheath to the level of the distal ureter, taking great care not to  pass the sheath proximal to the visualized portions.  Flexible digital  ureteroscopy was performed to the proximal left ureter. There was a fairly  sizable proximal ureteral stone as anticipated.  This was too large for simple basketing. It was retrograde positioned at the level of the kidney.  Inspection of the kidney  revealed a very large volume renal pelvis stone, estimated to be greater than 1.5 cm.  This was much too large for simple basketing.  The ultimate goal was to render stone free.  Holmium laser energy was applied to the stone using 0.2 joules and 30 Hz,  using dusting technique, approximately 70% of the stone volume was ablated.  This required multiple cycles of lithotripsy and irrigation, given the massive volume of stone.  After approximately 70 minutes of this, visualization was so poor inherently  that I felt that a staged approach would clearly be most advantageous and ceased the left-sided portion of the surgery today.  As such, the access sheath was removed under continuous vision.  No significant mucosal abnormalities were found.  Access  sheath was then placed over the right working wire to the level of the distal ureter and flexible digital ureteroscopy was performed of the entire length of right ureter and systematic inspection of the right kidney.  There were multifocal renal stones,  most of which were 4-5 mm; however, there were at least 3-4 that were much larger.  Holmium laser energy was similarly applied to these stones at similar settings using a dusting technique and estimated again approximately 70% of the stone was ablated.   The access sheath was removed under continuous vision.  No significant mucosal abnormalities were found.  Next, 5 x 26 Polaris type stents were placed over the remaining safety wires bilaterally using fluoroscopic guidance.  Good proximal and distal  plane were noted.  Procedure was terminated.  The patient tolerated procedure well.  No immediate perioperative complications.  The patient taken to postanesthesia care in stable condition.  Plan for discharge home and second stage procedure in  several  weeks.   SHW D: 01/10/2021 10:16:27 am T: 01/10/2021 11:40:00 am  JOB: [id number]/ [id number]

## 2021-01-10 NOTE — Anesthesia Procedure Notes (Signed)
Procedure Name: LMA Insertion Date/Time: 01/10/2021 8:46 AM Performed by: Lavina Hamman, CRNA Pre-anesthesia Checklist: Patient identified, Emergency Drugs available, Suction available and Patient being monitored Patient Re-evaluated:Patient Re-evaluated prior to induction Oxygen Delivery Method: Circle System Utilized Preoxygenation: Pre-oxygenation with 100% oxygen Induction Type: IV induction Ventilation: Mask ventilation without difficulty LMA: LMA with gastric port inserted LMA Size: 4.0 Number of attempts: 1 Airway Equipment and Method: Bite block Placement Confirmation: positive ETCO2 Tube secured with: Tape Dental Injury: Teeth and Oropharynx as per pre-operative assessment

## 2021-01-10 NOTE — Brief Op Note (Signed)
01/10/2021  10:10 AM  PATIENT:  Brandon Schaefer  65 y.o. male  PRE-OPERATIVE DIAGNOSIS:  BILATERAL RENAL STONES  POST-OPERATIVE DIAGNOSIS:  bilateral renal stones  PROCEDURE:  Procedure(s): CYSTOSCOPY WITH RETROGRADE PYELOGRAM, URETEROSCOPY AND STENT EXCHANGE, FIRST STAGE (Bilateral) HOLMIUM LASER APPLICATION (Bilateral)  SURGEON:  Surgeon(s) and Role:    * Alexis Frock, MD - Primary  PHYSICIAN ASSISTANT:   ASSISTANTS: none   ANESTHESIA:   general  EBL:  minimal   BLOOD ADMINISTERED:none  DRAINS: none   LOCAL MEDICATIONS USED:  NONE  SPECIMEN:  No Specimen  DISPOSITION OF SPECIMEN:  N/A  COUNTS:  YES  TOURNIQUET:  * No tourniquets in log *  DICTATION: .Other Dictation: Dictation Number   2*  PLAN OF CARE: Discharge to home after PACU  PATIENT DISPOSITION:  PACU - hemodynamically stable.   Delay start of Pharmacological VTE agent (>24hrs) due to surgical blood loss or risk of bleeding: yes

## 2021-01-10 NOTE — Transfer of Care (Signed)
Immediate Anesthesia Transfer of Care Note  Patient: Kristoff Coonradt  Procedure(s) Performed: Procedure(s): CYSTOSCOPY WITH RETROGRADE PYELOGRAM, URETEROSCOPY AND STENT EXCHANGE, FIRST STAGE (Bilateral) HOLMIUM LASER APPLICATION (Bilateral)  Patient Location: PACU  Anesthesia Type:General  Level of Consciousness:  sedated, patient cooperative and responds to stimulation  Airway & Oxygen Therapy:Patient Spontanous Breathing and Patient connected to face mask oxgen  Post-op Assessment:  Report given to PACU RN and Post -op Vital signs reviewed and stable  Post vital signs:  Reviewed and stable  Last Vitals:  Vitals:   01/10/21 0705  BP: (!) 153/62  Pulse: 80  Resp: 20  Temp: 36.9 C  SpO2: 199%    Complications: No apparent anesthesia complications

## 2021-01-10 NOTE — Anesthesia Postprocedure Evaluation (Signed)
Anesthesia Post Note  Patient: Brandon Schaefer  Procedure(s) Performed: CYSTOSCOPY WITH RETROGRADE PYELOGRAM, URETEROSCOPY AND STENT EXCHANGE, FIRST STAGE (Bilateral) HOLMIUM LASER APPLICATION (Bilateral)     Patient location during evaluation: PACU Anesthesia Type: General Level of consciousness: awake and alert and oriented Pain management: pain level controlled Vital Signs Assessment: post-procedure vital signs reviewed and stable Respiratory status: spontaneous breathing, nonlabored ventilation and respiratory function stable Cardiovascular status: blood pressure returned to baseline Postop Assessment: no apparent nausea or vomiting Anesthetic complications: no   No notable events documented.  Last Vitals:  Vitals:   01/10/21 1106 01/10/21 1111  BP:    Pulse:  77  Resp:  19  Temp:  36.8 C  SpO2: 95% 93%    Last Pain:  Vitals:   01/10/21 1111  TempSrc:   PainSc: [place]

## 2021-01-10 NOTE — H&P (Signed)
Brandon Schaefer is an 65 y.o. male.    Chief Complaint: Pre-Op BILATERAL Ureteroscopic Stone Manipulation  HPI:   1 - Surgical Nephrolithiasis - s/p two stage left percutaneous right ureteroscopic stone surgery 12/2012 for left partial staghorn and right intrarenal stones. No prior stones.    Recent Surveillance:  09/2013 CT - LLP punctate only, CMP normal.  12/2020 - Left Renal 2cm, lefft multifocal ureteral, Right renal stones by ER CT ==> Left ureteral stent  2 - Metabolic Stone Disease / Oliguria / Hypocitraturia-   Eval 2014: BMP,PTH,Urate - normal, Composition - CaOx mono, 24hr Urines - very low volume (<1L per day) and citrate (<300). --> K Cit 10MEQ BID + increased hydration    PMH sig for colon cancer ((s/p surgery, chemo, ), morbid obesity (400lb), IDDM2. No CV disease.    Today Brandon Schaefer is seen to proceed with BILATERAL ureteroscopic stone manipulation for multifocal stone recurrence. No interval fevers. Most recent UCX negative.    Past Medical History:  Diagnosis Date  . Allergy   . Anemia   . Anxiety   . colon ca dx'd 11/2011  . Dental abscess 08/03/2015  . Diabetes mellitus   . Headache(784.0)   . History of blood transfusion   . History of kidney stones   . Hyperlipidemia   . Hypertension   . Neuromuscular disorder ([place])    peripheral neuropathy  . Shortness of breath    with exertion    Past Surgical History:  Procedure Laterality Date  . COMPLEX WOUND CLOSURE N/A 01/06/2013   Procedure: EXCISION CHRONIC ABDOMINAL WOUND;  Surgeon: Harl Bowie, MD;  Location: [facility];  Service: General;  Laterality: N/A;  . CYSTOSCOPY W/ URETERAL STENT PLACEMENT Bilateral 01/04/2013   Procedure: CYSTOSCOPY WITH RETROGRADE PYELOGRAM/Right double J URETERAL STENT PLACEMENT;  Surgeon: Alexis Frock, MD;  Location: [facility];  Service: Urology;  Laterality: Bilateral;  . CYSTOSCOPY W/ URETERAL STENT PLACEMENT Left 11/27/2020   Procedure: CYSTOSCOPY WITH RETROGRADE  PYELOGRAM/URETERAL STENT PLACEMENT;  Surgeon: Ceasar Mons, MD;  Location: [facility];  Service: Urology;  Laterality: Left;  . CYSTOSCOPY WITH RETROGRADE PYELOGRAM, URETEROSCOPY AND STENT PLACEMENT Right 01/06/2013   Procedure: CYSTOSCOPY WITH RIGHT RETROGRADE PYELOGRAM, URETEROSCOPY AND BILATERAL STENT EXCHANGE, BILATERAL STONE BASKET EXTRACTION ;  Surgeon: Alexis Frock, MD;  Location: [facility];  Service: Urology;  Laterality: Right;  . FRACTURE SURGERY      ORIF-left radius has pin  . HOLMIUM LASER APPLICATION N/A 6/38/4536   Procedure: HOLMIUM LASER APPLICATION;  Surgeon: Alexis Frock, MD;  Location: [facility];  Service: Urology;  Laterality: N/A;  . NEPHROLITHOTOMY Left 01/04/2013   Procedure: NEPHROLITHOTOMY PERCUTANEOUS  LEFT 1ST STAGE PERCUTANEOUS NEPHROSTOLITHOTOMY;  Surgeon: Alexis Frock, MD;  Location: [facility];  Service: Urology;  Laterality: Left;  . NEPHROLITHOTOMY Left 01/06/2013   Procedure: NEPHROLITHOTOMY PERCUTANEOUS SECOND LOOK;  Surgeon: Alexis Frock, MD;  Location: [facility];  Service: Urology;  Laterality: Left;  . PARTIAL COLECTOMY  01/17/2012   Procedure: PARTIAL COLECTOMY;  Surgeon: Harl Bowie, MD;  Location: [facility];  Service: General;;  . PILONIDAL CYST EXCISION    . PORT-A-CATH REMOVAL Left 01/06/2013   Procedure: REMOVAL PORT-A-CATH;  Surgeon: Harl Bowie, MD;  Location: [facility];  Service: General;  Laterality: Left;  . PORTACATH PLACEMENT  03/19/2012   Procedure: INSERTION PORT-A-CATH;  Surgeon: Harl Bowie, MD;  Location: [facility];  Service: General;  Laterality: N/A;  . PROCTOSCOPY  01/17/2012   Procedure: PROCTOSCOPY;  Surgeon: Harl Bowie, MD;  Location: [facility];  Service: General;;    Family History  Problem Relation Age of Onset  . Diabetes Father   . Cancer Maternal Aunt        colon  . Cancer Maternal Grandmother        colon   Social History:  reports that he has never smoked. He has never used smokeless  tobacco. He reports that he does not drink alcohol and does not use drugs.  Allergies:  Allergies  Allergen Reactions  . [place] Elastic Foam Strap [Attends Briefs Small] Hives    **Elastic in socks   . Adhesive [Tape] Rash    No medications prior to admission.    No results found for this or any previous visit (from the past 48 hour(s)). No results found.  Review of Systems  Constitutional:  Negative for chills and fever.  Genitourinary:  Positive for urgency.  All other systems reviewed and are negative.  There were no vitals taken for this visit. Physical Exam Vitals reviewed.  Constitutional:      Comments: Stable morbid obesity. Very pleasant, at baseline.   HENT:     Nose: Nose normal.     Mouth/Throat:     Mouth: Mucous membranes are moist.  Pulmonary:     Effort: Pulmonary effort is normal.  Abdominal:     Comments: Obesity limits sensitivity of exam  Genitourinary:    Comments: No CVAT at present Musculoskeletal:     Cervical back: Normal range of motion.  Skin:    General: Skin is warm.  Neurological:     General: No focal deficit present.     Mental Status: He is alert.     Assessment/Plan  Proceed as planned with BILATERAL ureteroscopic stone manipulation. Risks, benefits, alternatives, expected peri-op course discussed previously and reiterated today including possible staged approach. He understands that his major metabolic comorbidity increases risk of ALL peri-op complications including severe infection and mortality.   Alexis Frock, MD 01/10/2021, 6:29 AM

## 2021-01-10 NOTE — Discharge Instructions (Addendum)
1 - You may have urinary urgency (bladder spasms) and bloody urine on / off with stent in place. This is normal. ° °2 - Call MD or go to ER for fever >102, severe pain / nausea / vomiting not relieved by medications, or acute change in medical status ° °

## 2021-01-11 ENCOUNTER — Encounter (HOSPITAL_COMMUNITY): Payer: Self-pay | Admitting: Urology

## 2021-01-11 ENCOUNTER — Other Ambulatory Visit: Payer: Self-pay

## 2021-01-11 DIAGNOSIS — I10 Essential (primary) hypertension: Secondary | ICD-10-CM | POA: Diagnosis not present

## 2021-01-11 DIAGNOSIS — E1142 Type 2 diabetes mellitus with diabetic polyneuropathy: Secondary | ICD-10-CM | POA: Diagnosis not present

## 2021-01-11 DIAGNOSIS — N136 Pyonephrosis: Secondary | ICD-10-CM | POA: Diagnosis not present

## 2021-01-11 DIAGNOSIS — A419 Sepsis, unspecified organism: Secondary | ICD-10-CM | POA: Diagnosis not present

## 2021-01-11 DIAGNOSIS — M170 Bilateral primary osteoarthritis of knee: Secondary | ICD-10-CM | POA: Diagnosis not present

## 2021-01-11 MED ORDER — FREESTYLE LIBRE 14 DAY SENSOR MISC: 5 refills | Status: DC

## 2021-01-16 ENCOUNTER — Other Ambulatory Visit: Payer: Self-pay | Admitting: Urology

## 2021-01-23 NOTE — Patient Instructions (Addendum)
DUE TO COVID-19 ONLY ONE VISITOR IS ALLOWED TO COME WITH YOU AND STAY IN THE WAITING ROOM ONLY DURING PRE OP AND PROCEDURE.   **NO VISITORS ARE ALLOWED IN THE SHORT STAY AREA OR RECOVERY ROOM!!**  IF YOU WILL BE ADMITTED INTO THE HOSPITAL YOU ARE ALLOWED ONLY TWO SUPPORT PEOPLE DURING VISITATION HOURS ONLY (10AM -8PM)   The support person(s) may change daily. The support person(s) must pass our screening, gel in and out, and wear a mask at all times, including in the patient's room. Patients must also wear a mask when staff or their support person are in the room.  No visitors under the age of 21. Any visitor under the age of 23 must be accompanied by an adult.        Your procedure is scheduled on: 01/31/21   Report to [facility]    Report to admitting at : 1:45 PM   Call this number if you have problems the morning of surgery [phone]   Do not eat food :After Midnight.   May have liquids until: 1:00 PM    day of surgery  CLEAR LIQUID DIET  Foods Allowed                                                                     Foods Excluded  Water, Black Coffee and tea, regular and decaf                             liquids that you cannot  Plain Jell-O in any flavor  (No red)                                           see through such as: Fruit ices (not with fruit pulp)                                     milk, soups, orange juice              Iced Popsicles (No red)                                    All solid food                                   Apple juices Sports drinks like Gatorade (No red) Lightly seasoned clear broth or consume(fat free) Sugar,   Sample Menu Breakfast                                Lunch                                     Supper Cranberry juice  Beef broth                            Chicken broth Jell-O                                     Grape juice                           Apple juice Coffee or tea                         Jell-O                                      Popsicle                                                Coffee or tea                        Coffee or tea     Oral Hygiene is also important to reduce your risk of infection.                                    Remember - BRUSH YOUR TEETH THE MORNING OF SURGERY WITH YOUR REGULAR TOOTHPASTE   Do NOT smoke after Midnight   Take these medicines the morning of surgery with A SIP OF WATER: Pregabalin,carvedilol,  How to Manage Your Diabetes Before and After Surgery  Why is it important to control my blood sugar before and after surgery? Improving blood sugar levels before and after surgery helps healing and can limit problems. A way of improving blood sugar control is eating a healthy diet by:  Eating less sugar and carbohydrates  Increasing activity/exercise  Talking with your doctor about reaching your blood sugar goals High blood sugars (greater than 180 mg/dL) can raise your risk of infections and slow your recovery, so you will need to focus on controlling your diabetes during the weeks before surgery. Make sure that the doctor who takes care of your diabetes knows about your planned surgery including the date and location.  How do I manage my blood sugar before surgery? Check your blood sugar at least 4 times a day, starting 2 days before surgery, to make sure that the level is not too high or low. Check your blood sugar the morning of your surgery when you wake up and every 2 hours until you get to the Short Stay unit. If your blood sugar is less than 70 mg/dL, you will need to treat for low blood sugar: Do not take insulin. Treat a low blood sugar (less than 70 mg/dL) with  cup of clear juice (cranberry or apple), 4 glucose tablets, OR glucose gel. Recheck blood sugar in 15 minutes after treatment (to make sure it is greater than 70 mg/dL). If your blood sugar is not greater than 70 mg/dL on recheck, call [phone] for  further instructions. Report your blood sugar to the short stay  nurse when you get to Short Stay.  If you are admitted to the hospital after surgery: Your blood sugar will be checked by the staff and you will probably be given insulin after surgery (instead of oral diabetes medicines) to make sure you have good blood sugar levels. The goal for blood sugar control after surgery is 80-180 mg/dL.   WHAT DO I DO ABOUT MY DIABETES MEDICATION?  Do not take oral diabetes medicines (pills) the morning of surgery.  THE DAY BEFORE SURGERY, take metformin as usual.Take ONLY morning dose of glipizide,DO NOT take evening dose of glipizide.Take ONLY morning dose of lispro,DO NOT take bedtime dose of lispro.       THE MORNING OF SURGERY,  If your CBG is greater than 220 mg/dL, you may take  of your sliding scale  (correction) dose of insulin.    DO NOT TAKE ANY ORAL DIABETIC MEDICATIONS DAY OF YOUR SURGERY                              You may not have any metal on your body including hair pins, jewelry, and body piercing             Do not wear lotions, powders, perfumes/cologne, or deodorant              Men may shave face and neck.   Do not bring valuables to the hospital. [place].   Contacts, dentures or bridgework may not be worn into surgery.   Bring small overnight bag day of surgery.    Patients discharged on the day of surgery will not be allowed to drive home.   Special Instructions: Bring a copy of your healthcare power of attorney and living will documents         the day of surgery if you haven't scanned them before.              Please read over the following fact sheets you were given: IF YOU HAVE QUESTIONS ABOUT YOUR PRE-OP INSTRUCTIONS PLEASE CALL [phone]   [facility] - Preparing for Surgery Before surgery, you can play an important role.  Because skin is not sterile, your skin needs to be as free of germs as  possible.  You can reduce the number of germs on your skin by washing with CHG (chlorahexidine gluconate) soap before surgery.  CHG is an antiseptic cleaner which kills germs and bonds with the skin to continue killing germs even after washing. Please DO NOT use if you have an allergy to CHG or antibacterial soaps.  If your skin becomes reddened/irritated stop using the CHG and inform your nurse when you arrive at Short Stay. Do not shave (including legs and underarms) for at least 48 hours prior to the first CHG shower.  You may shave your face/neck. Please follow these instructions carefully:  1.  Shower with CHG Soap the night before surgery and the  morning of Surgery.  2.  If you choose to wash your hair, wash your hair first as usual with your  normal  shampoo.  3.  After you shampoo, rinse your hair and body thoroughly to remove the  shampoo.  4.  Use CHG as you would any other liquid soap.  You can apply chg directly  to the skin and wash                       Gently with a scrungie or clean washcloth.  5.  Apply the CHG Soap to your body ONLY FROM THE NECK DOWN.   Do not use on face/ open                           Wound or open sores. Avoid contact with eyes, ears mouth and genitals (private parts).                       Wash face,  Genitals (private parts) with your normal soap.             6.  Wash thoroughly, paying special attention to the area where your surgery  will be performed.  7.  Thoroughly rinse your body with warm water from the neck down.  8.  DO NOT shower/wash with your normal soap after using and rinsing off  the CHG Soap.                9.  Pat yourself dry with a clean towel.            10.  Wear clean pajamas.            11.  Place clean sheets on your bed the night of your first shower and do not  sleep with pets. Day of Surgery : Do not apply any lotions/deodorants the morning of surgery.  Please wear clean clothes to the hospital/surgery  center.  FAILURE TO FOLLOW THESE INSTRUCTIONS MAY RESULT IN THE CANCELLATION OF YOUR SURGERY PATIENT SIGNATURE_________________________________  NURSE SIGNATURE__________________________________  ________________________________________________________________________

## 2021-01-24 ENCOUNTER — Encounter (HOSPITAL_COMMUNITY)
Admission: RE | Admit: 2021-01-24 | Discharge: 2021-01-24 | Disposition: A | Discharge status: Home / Self Care | Payer: Medicare Other | Source: Ambulatory Visit | Attending: Urology | Admitting: Urology

## 2021-01-24 ENCOUNTER — Other Ambulatory Visit: Payer: Self-pay

## 2021-01-24 ENCOUNTER — Encounter (HOSPITAL_COMMUNITY): Payer: Self-pay

## 2021-01-24 DIAGNOSIS — Z01812 Encounter for preprocedural laboratory examination: Secondary | ICD-10-CM | POA: Insufficient documentation

## 2021-01-24 LAB — GLUCOSE, CAPILLARY: Glucose-Capillary: 137 mg/dL — ABNORMAL HIGH (ref 70–99)

## 2021-01-24 LAB — BASIC METABOLIC PANEL
Anion gap: 9 (ref 5–15)
BUN: 20 mg/dL (ref 8–23)
CO2: 26 mmol/L (ref 22–32)
Calcium: 9.8 mg/dL (ref 8.9–10.3)
Chloride: 106 mmol/L (ref 98–111)
Creatinine, Ser: 0.91 mg/dL (ref 0.61–1.24)
GFR, Estimated: >60 mL/min (ref >60)
Glucose, Bld: 110 mg/dL — ABNORMAL HIGH (ref 70–99)
Potassium: 4.5 mmol/L (ref 3.5–5.1)
Sodium: 141 mmol/L (ref 135–145)

## 2021-01-24 LAB — CBC
HCT: 42.2 % (ref 39.0–52.0)
Hemoglobin: 14.1 g/dL (ref 13.0–17.0)
MCH: 28.2 pg (ref 26.0–34.0)
MCHC: 33.4 g/dL (ref 30.0–36.0)
MCV: 84.4 fL (ref 80.0–100.0)
Platelets: 296 10*3/uL (ref 150–400)
RBC: 5 MIL/uL (ref 4.22–5.81)
RDW: 14.2 % (ref 11.5–15.5)
WBC: 10.7 10*3/uL — ABNORMAL HIGH (ref 4.0–10.5)
nRBC: 0 % (ref 0.0–0.2)

## 2021-01-24 NOTE — Progress Notes (Signed)
COVID Vaccine Completed: Yes Date COVID Vaccine completed: 01/2020. X 3 COVID vaccine manufacturer: Pfizer     COVID Test: N/A  PCP - Dr. Billey Gosling. LOV: 12/15/20 Cardiologist -   Chest x-ray -  EKG - 01/10/21 Stress Test -  ECHO - 11/26/15 Cardiac Cath -  Pacemaker/ICD device last checked: A1C: 7.6: 12/15/20 Sleep Study -  CPAP -   Fasting Blood Sugar - 180's Checks Blood Sugar : continuous monitor Dr. Tresa Moore Blood Thinner Instructions: Aspirin Instructions:To hold 2 days before Last Dose:  Anesthesia review: Hx: HTN,DIA  Patient denies shortness of breath, fever, cough and chest pain at PAT appointment   Patient verbalized understanding of instructions that were given to them at the PAT appointment. Patient was also instructed that they will need to review over the PAT instructions again at home before surgery.

## 2021-01-30 ENCOUNTER — Other Ambulatory Visit: Payer: Self-pay | Admitting: Internal Medicine

## 2021-01-31 ENCOUNTER — Ambulatory Visit (HOSPITAL_COMMUNITY)
Admission: RE | Admit: 2021-01-31 | Discharge: 2021-01-31 | Disposition: A | Discharge status: Home / Self Care | Payer: Medicare Other | Source: Ambulatory Visit | Attending: Urology | Admitting: Urology

## 2021-01-31 ENCOUNTER — Encounter (HOSPITAL_COMMUNITY): Payer: Self-pay | Admitting: Urology

## 2021-01-31 ENCOUNTER — Other Ambulatory Visit: Payer: Self-pay

## 2021-01-31 ENCOUNTER — Ambulatory Visit (HOSPITAL_COMMUNITY): Payer: Medicare Other | Admitting: Certified Registered Nurse Anesthetist

## 2021-01-31 ENCOUNTER — Encounter (HOSPITAL_COMMUNITY)
Admission: RE | Disposition: A | Discharge status: Home / Self Care | Payer: Self-pay | Source: Ambulatory Visit | Attending: Urology

## 2021-01-31 ENCOUNTER — Ambulatory Visit (HOSPITAL_COMMUNITY): Payer: Medicare Other

## 2021-01-31 DIAGNOSIS — N202 Calculus of kidney with calculus of ureter: Secondary | ICD-10-CM | POA: Diagnosis not present

## 2021-01-31 DIAGNOSIS — Z85038 Personal history of other malignant neoplasm of large intestine: Secondary | ICD-10-CM | POA: Insufficient documentation

## 2021-01-31 DIAGNOSIS — E1142 Type 2 diabetes mellitus with diabetic polyneuropathy: Secondary | ICD-10-CM | POA: Insufficient documentation

## 2021-01-31 DIAGNOSIS — R82991 Hypocitraturia: Secondary | ICD-10-CM | POA: Insufficient documentation

## 2021-01-31 DIAGNOSIS — R34 Anuria and oliguria: Secondary | ICD-10-CM | POA: Insufficient documentation

## 2021-01-31 DIAGNOSIS — Z466 Encounter for fitting and adjustment of urinary device: Secondary | ICD-10-CM | POA: Diagnosis not present

## 2021-01-31 DIAGNOSIS — Z794 Long term (current) use of insulin: Secondary | ICD-10-CM | POA: Insufficient documentation

## 2021-01-31 DIAGNOSIS — Z6841 Body Mass Index (BMI) 40.0 and over, adult: Secondary | ICD-10-CM | POA: Insufficient documentation

## 2021-01-31 DIAGNOSIS — I10 Essential (primary) hypertension: Secondary | ICD-10-CM | POA: Diagnosis not present

## 2021-01-31 DIAGNOSIS — E785 Hyperlipidemia, unspecified: Secondary | ICD-10-CM | POA: Diagnosis not present

## 2021-01-31 HISTORY — PX: HOLMIUM LASER APPLICATION: SHX5852

## 2021-01-31 HISTORY — PX: CYSTOSCOPY WITH RETROGRADE PYELOGRAM, URETEROSCOPY AND STENT PLACEMENT: SHX5789

## 2021-01-31 LAB — GLUCOSE, CAPILLARY
Glucose-Capillary: 185 mg/dL — ABNORMAL HIGH (ref 70–99)
Glucose-Capillary: 202 mg/dL — ABNORMAL HIGH (ref 70–99)

## 2021-01-31 SURGERY — CYSTOURETEROSCOPY, WITH RETROGRADE PYELOGRAM AND STENT INSERTION
Anesthesia: General | Laterality: Bilateral

## 2021-01-31 MED ORDER — CEFAZOLIN IN SODIUM CHLORIDE 3-0.9 GM/100ML-% IV SOLN
3.0000 g | INTRAVENOUS | Status: AC
  Administered 2021-01-31: 3 g via INTRAVENOUS
  Filled 2021-01-31: qty 100

## 2021-01-31 MED ORDER — FENTANYL CITRATE (PF) 100 MCG/2ML IJ SOLN
INTRAMUSCULAR | Status: AC
  Filled 2021-01-31: qty 2

## 2021-01-31 MED ORDER — CEPHALEXIN 500 MG PO CAPS: 500.0000 mg | ORAL_CAPSULE | Freq: Two times a day (BID) | ORAL | 0 refills | Status: AC

## 2021-01-31 MED ORDER — ORAL CARE MOUTH RINSE: 15.0000 mL | Freq: Once | OROMUCOSAL | Status: DC

## 2021-01-31 MED ORDER — CHLORHEXIDINE GLUCONATE 0.12 % MT SOLN: 15.0000 mL | Freq: Once | OROMUCOSAL | Status: DC

## 2021-01-31 MED ORDER — DEXAMETHASONE SODIUM PHOSPHATE 10 MG/ML IJ SOLN
INTRAMUSCULAR | Status: AC
  Filled 2021-01-31: qty 1

## 2021-01-31 MED ORDER — MEPERIDINE HCL 50 MG/ML IJ SOLN: 6.2500 mg | INTRAMUSCULAR | Status: DC | PRN

## 2021-01-31 MED ORDER — MIDAZOLAM HCL 2 MG/2ML IJ SOLN
INTRAMUSCULAR | Status: AC
  Filled 2021-01-31: qty 2

## 2021-01-31 MED ORDER — FENTANYL CITRATE PF 50 MCG/ML IJ SOSY
25.0000 ug | PREFILLED_SYRINGE | INTRAMUSCULAR | Status: DC | PRN
  Administered 2021-01-31 (×2): 50 ug via INTRAVENOUS

## 2021-01-31 MED ORDER — FENTANYL CITRATE (PF) 100 MCG/2ML IJ SOLN
INTRAMUSCULAR | Status: DC | PRN
  Administered 2021-01-31: 25 ug via INTRAVENOUS
  Administered 2021-01-31: 50 ug via INTRAVENOUS
  Administered 2021-01-31 (×5): 25 ug via INTRAVENOUS

## 2021-01-31 MED ORDER — ACETAMINOPHEN 10 MG/ML IV SOLN
1000.0000 mg | Freq: Once | INTRAVENOUS | Status: DC | PRN
  Administered 2021-01-31: 1000 mg via INTRAVENOUS

## 2021-01-31 MED ORDER — ACETAMINOPHEN 10 MG/ML IV SOLN
INTRAVENOUS | Status: AC
  Filled 2021-01-31: qty 100

## 2021-01-31 MED ORDER — LIDOCAINE 2% (20 MG/ML) 5 ML SYRINGE
INTRAMUSCULAR | Status: DC | PRN
  Administered 2021-01-31: 60 mg via INTRAVENOUS

## 2021-01-31 MED ORDER — IOHEXOL 300 MG/ML  SOLN
INTRAMUSCULAR | Status: DC | PRN
  Administered 2021-01-31: 18 mL via URETHRAL

## 2021-01-31 MED ORDER — FENTANYL CITRATE PF 50 MCG/ML IJ SOSY
PREFILLED_SYRINGE | INTRAMUSCULAR | Status: AC
  Filled 2021-01-31: qty 2

## 2021-01-31 MED ORDER — SODIUM CHLORIDE 0.9 % IR SOLN
Status: DC | PRN
  Administered 2021-01-31: 3000 mL via INTRAVESICAL

## 2021-01-31 MED ORDER — PROPOFOL 10 MG/ML IV BOLUS
INTRAVENOUS | Status: DC | PRN
  Administered 2021-01-31: 180 mg via INTRAVENOUS

## 2021-01-31 MED ORDER — LIDOCAINE HCL (PF) 2 % IJ SOLN
INTRAMUSCULAR | Status: AC
  Filled 2021-01-31: qty 5

## 2021-01-31 MED ORDER — ONDANSETRON HCL 4 MG/2ML IJ SOLN
INTRAMUSCULAR | Status: AC
  Filled 2021-01-31: qty 2

## 2021-01-31 MED ORDER — MIDAZOLAM HCL 5 MG/5ML IJ SOLN
INTRAMUSCULAR | Status: DC | PRN
Start: 2021-01-31 — End: 2021-01-31
  Administered 2021-01-31: 2 mg via INTRAVENOUS

## 2021-01-31 MED ORDER — ONDANSETRON HCL 4 MG/2ML IJ SOLN: 4.0000 mg | Freq: Once | INTRAMUSCULAR | Status: DC | PRN

## 2021-01-31 MED ORDER — OXYCODONE-ACETAMINOPHEN 5-325 MG PO TABS: 1.0000 | ORAL_TABLET | Freq: Four times a day (QID) | ORAL | 0 refills | Status: DC | PRN

## 2021-01-31 MED ORDER — KETOROLAC TROMETHAMINE 10 MG PO TABS: 10.0000 mg | ORAL_TABLET | Freq: Three times a day (TID) | ORAL | 0 refills | Status: DC | PRN

## 2021-01-31 MED ORDER — LACTATED RINGERS IV SOLN: INTRAVENOUS | Status: DC

## 2021-01-31 MED ORDER — DEXAMETHASONE SODIUM PHOSPHATE 10 MG/ML IJ SOLN
INTRAMUSCULAR | Status: DC | PRN
  Administered 2021-01-31: 10 mg via INTRAVENOUS

## 2021-01-31 MED ORDER — ONDANSETRON HCL 4 MG/2ML IJ SOLN
INTRAMUSCULAR | Status: DC | PRN
  Administered 2021-01-31: 4 mg via INTRAVENOUS

## 2021-01-31 SURGICAL SUPPLY — 23 items
BAG URO CATCHER STRL LF (MISCELLANEOUS) ×2 IMPLANT
BASKET LASER NITINOL 1.9FR (BASKET) ×4 IMPLANT
BASKET STONE NCOMPASS (UROLOGICAL SUPPLIES) ×2 IMPLANT
CATH INTERMIT  6FR 70CM (CATHETERS) ×2 IMPLANT
CLOTH BEACON ORANGE TIMEOUT ST (SAFETY) ×2 IMPLANT
EXTRACTOR STONE 1.7FRX115CM (UROLOGICAL SUPPLIES) IMPLANT
GLOVE SURG ENC TEXT LTX SZ7.5 (GLOVE) ×2 IMPLANT
GOWN STRL REUS W/TWL LRG LVL3 (GOWN DISPOSABLE) ×4 IMPLANT
GUIDEWIRE ANG ZIPWIRE 038X150 (WIRE) ×4 IMPLANT
GUIDEWIRE STR DUAL SENSOR (WIRE) ×4 IMPLANT
LASER FIB FLEXIVA PULSE ID 365 (Laser) IMPLANT
LASER FIB FLEXIVA PULSE ID 550 (Laser) IMPLANT
LASER FIB FLEXIVA PULSE ID 910 (Laser) IMPLANT
MANIFOLD NEPTUNE II (INSTRUMENTS) ×2 IMPLANT
PACK CYSTO (CUSTOM PROCEDURE TRAY) ×2 IMPLANT
SHEATH URETERAL 12FRX28CM (UROLOGICAL SUPPLIES) IMPLANT
SHEATH URETERAL 12FRX35CM (MISCELLANEOUS) ×2 IMPLANT
STENT POLARIS 5FRX24 (STENTS) ×4 IMPLANT
TRACTIP FLEXIVA PULS ID 200XHI (Laser) ×1 IMPLANT
TRACTIP FLEXIVA PULSE ID 200 (Laser) ×1
TUBE FEEDING 8FR 16IN STR KANG (MISCELLANEOUS) ×2 IMPLANT
TUBING CONNECTING 10 (TUBING) ×2 IMPLANT
TUBING UROLOGY SET (TUBING) ×2 IMPLANT

## 2021-01-31 NOTE — Anesthesia Preprocedure Evaluation (Addendum)
Anesthesia Evaluation  Patient identified by MRN, date of birth, ID band Patient awake    Reviewed: Allergy & Precautions, NPO status , Patient's Chart, lab work & pertinent test results, reviewed documented beta blocker date and time   History of Anesthesia Complications Negative for: history of anesthetic complications  Airway Mallampati: II  TM Distance: >3 FB Neck ROM: Full    Dental  (+) Missing,    Pulmonary    Pulmonary exam normal        Cardiovascular hypertension, Pt. on home beta blockers Normal cardiovascular exam  Echo 11/26/2015: moderate LVH, EF 55-60%, valves ok   Neuro/Psych Anxiety    GI/Hepatic negative GI ROS, Neg liver ROS, colon cancer status post partial colectomy in 2013   Endo/Other  diabetes, Type 2, Oral Hypoglycemic Agents, Insulin DependentMorbid obesity (BMI 51)  Renal/GU Renal stones  negative genitourinary   Musculoskeletal  (+) Arthritis ,   Abdominal (+) + obese,   Peds  Hematology   Anesthesia Other Findings Day of surgery medications reviewed with patient.  Reproductive/Obstetrics negative OB ROS                             Anesthesia Physical  Anesthesia Plan  ASA: 3  Anesthesia Plan: General   Post-op Pain Management:    Induction: Intravenous  PONV Risk Score and Plan: 2 and Treatment may vary due to age or medical condition, Ondansetron and Midazolam  Airway Management Planned: LMA  Additional Equipment: None  Intra-op Plan:   Post-operative Plan: Extubation in OR  Informed Consent: I have reviewed the patients History and Physical, chart, labs and discussed the procedure including the risks, benefits and alternatives for the proposed anesthesia with the patient or authorized representative who has indicated his/her understanding and acceptance.     Dental advisory given  Plan Discussed with: CRNA  Anesthesia Plan Comments:          Anesthesia Quick Evaluation

## 2021-01-31 NOTE — Transfer of Care (Signed)
Immediate Anesthesia Transfer of Care Note  Patient: Brandon Schaefer  Procedure(s) Performed: CYSTOSCOPY WITH RETROGRADE PYELOGRAM, URETEROSCOPY AND STENT EXCHANGE (Bilateral) HOLMIUM LASER APPLICATION (Bilateral)  Patient Location: PACU  Anesthesia Type:General  Level of Consciousness: awake, alert  and oriented  Airway & Oxygen Therapy: Patient Spontanous Breathing and Patient connected to face mask oxygen  Post-op Assessment: Report given to RN and Post -op Vital signs reviewed and stable  Post vital signs: Reviewed and stable  Last Vitals:  Vitals Value Taken Time  BP    Temp    Pulse    Resp    SpO2      Last Pain:  Vitals:   01/31/21 1333  TempSrc: Oral  PainSc: 5          Complications: No notable events documented.

## 2021-01-31 NOTE — Anesthesia Procedure Notes (Signed)
Procedure Name: LMA Insertion Date/Time: 01/31/2021 2:01 PM Performed by: Navin Dogan D, CRNA Pre-anesthesia Checklist: Patient identified, Emergency Drugs available, Suction available and Patient being monitored Patient Re-evaluated:Patient Re-evaluated prior to induction Oxygen Delivery Method: Circle system utilized Preoxygenation: Pre-oxygenation with 100% oxygen Induction Type: IV induction Ventilation: Mask ventilation without difficulty LMA: LMA inserted LMA Size: 4.0 Tube type: Oral Number of attempts: 1 Placement Confirmation: positive ETCO2 and breath sounds checked- equal and bilateral Tube secured with: Tape Dental Injury: Teeth and Oropharynx as per pre-operative assessment

## 2021-01-31 NOTE — Discharge Instructions (Signed)
1 - You may have urinary urgency (bladder spasms) and bloody urine on / off with stent in place. This is normal.  2 - Remove tethered stents on Friday morning at home by pulling on strings, then blue-white plastic tubing, and discard. Office is open Friday if any problems arise. There are TWO stents.   3 - Call MD or go to ER for fever >102, severe pain / nausea / vomiting not relieved by medications, or acute change in medical status

## 2021-01-31 NOTE — H&P (Signed)
Brandon Schaefer is an 65 y.o. male.    Chief Complaint: Pre-OP BILATERAL 2nd Stage Ureteroscopic Stone Manipulation  HPI:    1 - Surgical Nephrolithiasis - s/p two stage left percutaneous right ureteroscopic stone surgery 12/2012 for left partial staghorn and right intrarenal stones. No prior stones.    Recent Surveillance:  09/2013 CT - LLP punctate only, CMP normal.  12/2020 - Left Renal 2cm, lefft multifocal ureteral, Right renal stones by ER CT ==> Left ureteral stent  2 - Metabolic Stone Disease / Oliguria / Hypocitraturia-   Eval 2014: BMP,PTH,Urate - normal, Composition - CaOx mono, 24hr Urines - very low volume (<1L per day) and citrate (<300). --> K Cit 10MEQ BID + increased hydration    PMH sig for colon cancer ((s/p surgery, chemo, ), morbid obesity (400lb), IDDM2. No CV disease.    Today Brandon Schaefer is seen to proceed with BILATERAL 2nd stage ureteroscopic stone manipulation for multifocal stone recurrence. No interval fevers. Most recent UCX negative. He is s/p 1st stage procedure on 9/28.    Past Medical History:  Diagnosis Date   Allergy    Anemia    Anxiety    colon ca dx'd 11/2011   Dental abscess 08/03/2015   Diabetes mellitus    Headache(784.0)    History of blood transfusion    History of kidney stones    Hyperlipidemia    Hypertension    Neuromuscular disorder ([place])    peripheral neuropathy   Shortness of breath    with exertion    Past Surgical History:  Procedure Laterality Date   COMPLEX WOUND CLOSURE N/A 01/06/2013   Procedure: EXCISION CHRONIC ABDOMINAL WOUND;  Surgeon: Harl Bowie, MD;  Location: [facility];  Service: General;  Laterality: N/A;   CYSTOSCOPY W/ URETERAL STENT PLACEMENT Bilateral 01/04/2013   Procedure: CYSTOSCOPY WITH RETROGRADE PYELOGRAM/Right double J URETERAL STENT PLACEMENT;  Surgeon: Alexis Frock, MD;  Location: [facility];  Service: Urology;  Laterality: Bilateral;   CYSTOSCOPY W/ URETERAL STENT PLACEMENT Left 11/27/2020    Procedure: CYSTOSCOPY WITH RETROGRADE PYELOGRAM/URETERAL STENT PLACEMENT;  Surgeon: Ceasar Mons, MD;  [facility];  Service: Urology;  Laterality: Left;   CYSTOSCOPY WITH RETROGRADE PYELOGRAM, URETEROSCOPY AND STENT PLACEMENT Right 01/06/2013   Procedure: CYSTOSCOPY WITH RIGHT RETROGRADE PYELOGRAM, URETEROSCOPY AND BILATERAL STENT EXCHANGE, BILATERAL STONE BASKET EXTRACTION ;  Surgeon: Alexis Frock, MD;  [facility];  Service: Urology;  Laterality: Right;   CYSTOSCOPY WITH RETROGRADE PYELOGRAM, URETEROSCOPY AND STENT PLACEMENT Bilateral 01/10/2021   Procedure: CYSTOSCOPY WITH RETROGRADE PYELOGRAM, URETEROSCOPY AND STENT EXCHANGE, FIRST STAGE;  Surgeon: Alexis Frock, MD;  [facility];  Service: Urology;  Laterality: Bilateral;   FRACTURE SURGERY      ORIF-left radius has pin   HOLMIUM LASER APPLICATION N/A 1/61/0960   Procedure: HOLMIUM LASER APPLICATION;  Surgeon: Alexis Frock, MD;  [facility];  Service: Urology;  Laterality: N/A;   HOLMIUM LASER APPLICATION Bilateral 4/54/0981   Procedure: HOLMIUM LASER APPLICATION;  Surgeon: Alexis Frock, MD;  [facility];  Service: Urology;  Laterality: Bilateral;   NEPHROLITHOTOMY Left 01/04/2013   Procedure: NEPHROLITHOTOMY PERCUTANEOUS  LEFT 1ST STAGE PERCUTANEOUS NEPHROSTOLITHOTOMY;  Surgeon: Alexis Frock, MD;  [facility];  Service: Urology;  Laterality: Left;   NEPHROLITHOTOMY Left 01/06/2013   Procedure: NEPHROLITHOTOMY PERCUTANEOUS SECOND LOOK;  Surgeon: Alexis Frock, MD;  [facility];  Service: Urology;  Laterality: Left;   PARTIAL COLECTOMY  01/17/2012   Procedure: PARTIAL COLECTOMY;  Surgeon: Harl Bowie,  MD;  [facility];  Service: General;;   PILONIDAL CYST EXCISION     PORT-A-CATH REMOVAL Left 01/06/2013   Procedure: REMOVAL PORT-A-CATH;  Surgeon: Harl Bowie, MD;  [facility];  Service: General;  Laterality: Left;   PORTACATH PLACEMENT  03/19/2012   Procedure:  INSERTION PORT-A-CATH;  Surgeon: Harl Bowie, MD;  Location: [facility];  Service: General;  Laterality: N/A;   PROCTOSCOPY  01/17/2012   Procedure: PROCTOSCOPY;  Surgeon: Harl Bowie, MD;  [facility];  Service: General;;    Family History  Problem Relation Age of Onset   Diabetes Father    Cancer Maternal Aunt        colon   Cancer Maternal Grandmother        colon   Social History:  reports that he has never smoked. He has never used smokeless tobacco. He reports that he does not drink alcohol and does not use drugs.  Allergies:  Allergies  Allergen Reactions   [place] Elastic Foam Strap [Attends Briefs Small] Hives    **Elastic in socks    Adhesive [Tape] Rash    No medications prior to admission.    No results found for this or any previous visit (from the past 48 hour(s)). No results found.  Review of Systems  Constitutional:  Negative for chills and fever.  Genitourinary:  Positive for urgency.  All other systems reviewed and are negative.  There were no vitals taken for this visit. Physical Exam Vitals reviewed.  Constitutional:      Comments: Stable functional decline and stigmata of advance metabolic disease.   HENT:     Nose: Nose normal.     Mouth/Throat:     Mouth: Mucous membranes are moist.  Cardiovascular:     Rate and Rhythm: Normal rate.  Pulmonary:     Effort: Pulmonary effort is normal.  Abdominal:     Comments: Massive truncal obesity limits sensitivity of examl.   Genitourinary:    Comments: Minimal CVAT at present. Completely buried penis Musculoskeletal:        General: Normal range of motion.     Cervical back: Normal range of motion.  Skin:    General: Skin is warm.  Neurological:     Mental Status: He is alert.     Assessment/Plan  Proceed as planned with BILATERAL 2nd stage ureteroscopic stone manipulation. Risks, benefits, alternatives, expected peri-op course discussed previously and reiterated  today. His major metabolic comorbidity and poor funcitonal capacity increaseas risk of ALL peri-op complicaitons including mortality and severe infection.   Alexis Frock, MD 01/31/2021, 7:33 AM

## 2021-01-31 NOTE — Brief Op Note (Signed)
01/31/2021  3:40 PM  PATIENT:  Brandon Schaefer  65 y.o. male  PRE-OPERATIVE DIAGNOSIS:  RESIDUAL BILATERAL RENAL AND URETERAL STONES  POST-OPERATIVE DIAGNOSIS:  residual bilateral renal and ureteral stones  PROCEDURE:  Procedure(s) with comments: CYSTOSCOPY WITH RETROGRADE PYELOGRAM, URETEROSCOPY AND STENT PLACEMENT (Bilateral) - 90 MINS HOLMIUM LASER APPLICATION (Bilateral)  SURGEON:  Surgeon(s) and Role:    Alexis Frock, MD - Primary  PHYSICIAN ASSISTANT:   ASSISTANTS: none   ANESTHESIA:   general  EBL:  minmal   BLOOD ADMINISTERED:none  DRAINS: none   LOCAL MEDICATIONS USED:  NONE  SPECIMEN:  Source of Specimen:  bilateral renal / ureteral stone fragments  DISPOSITION OF SPECIMEN:   given to patient  COUNTS:  YES  TOURNIQUET:  * No tourniquets in log *  DICTATION: .Other Dictation: Dictation Number [id number]  PLAN OF CARE: Discharge to home after PACU  PATIENT DISPOSITION:  PACU - hemodynamically stable.   Delay start of Pharmacological VTE agent (>24hrs) due to surgical blood loss or risk of bleeding: yes

## 2021-02-01 ENCOUNTER — Encounter (HOSPITAL_COMMUNITY): Payer: Self-pay | Admitting: Urology

## 2021-02-01 NOTE — Anesthesia Postprocedure Evaluation (Signed)
Anesthesia Post Note  Patient: Brandon Schaefer  Procedure(s) Performed: CYSTOSCOPY WITH RETROGRADE PYELOGRAM, URETEROSCOPY AND STENT EXCHANGE (Bilateral) HOLMIUM LASER APPLICATION (Bilateral)     Patient location during evaluation: PACU Anesthesia Type: General Level of consciousness: awake and sedated Pain management: pain level controlled Vital Signs Assessment: post-procedure vital signs reviewed and stable Respiratory status: spontaneous breathing Cardiovascular status: stable Postop Assessment: no apparent nausea or vomiting Anesthetic complications: no   No notable events documented.  Last Vitals:  Vitals:   01/31/21 1630 01/31/21 1645  BP: (!) 149/64 132/68  Pulse: 78 83  Resp: 16 15  Temp:  36.7 C  SpO2: 91% 92%    Last Pain:  Vitals:   01/31/21 1645  TempSrc:   PainSc: [place]

## 2021-02-01 NOTE — Op Note (Signed)
NAME: DRADEN, COTTINGHAM MEDICAL RECORD NO: [id number] ACCOUNT NO: [id number] DATE OF BIRTH: 1955-10-26 FACILITY: [facility] LOCATION[facility] PH[facility]ICIAN: Alexis Frock, MD  Operative Report   DATE OF PROCEDURE: 01/31/2021  PREOPERATIVE DIAGNOSIS:  Residual left greater than right renal and ureteral stone, status post first stage procedure.  POSTOPERATIVE DIAGNOSIS:  Residual left greater than right renal and ureteral stone, status post first stage procedure.  PROCEDURE PERFORMED:   1.  Cystoscopy with bilateral retrograde pyelograms interpretation. 2.  Bilateral second stage ureteroscopy with laser lithotripsy. 3.  Exchange of bilateral ureteral stents 5 x 24 Polaris with tether.  ESTIMATED BLOOD LOSS:  Nil.  COMPLICATIONS:  None.  SPECIMENS:  Residual renal and ureteral stone fragments were given to the patient.  INDICATIONS:  Jayston is a pleasant, but a very comorbid 65 year old man with history of recurrent high risk urolithiasis.  He was found on workup of a colicky flank pain to have an obstructing  unilateral ureteral stone as well as the large volume  bilateral renal stones.  He underwent stenting at that time as a temporizing measure.  He was then referred for consideration of definitive management.  I discussed with Onix options including addressing his ureteral stent alone versus pass two stones  free with staged ureteroscopy and wished to proceed with the latter.  He underwent first stage procedure in late September.  He tolerated this fairly well.  He presents for a second stage procedure today.  Informed consent was obtained and placed in medical record.  PROCEDURE IN DETAIL:  The patient being verified as Lenore Cordia and the procedure being bilateral second stage ureteroscopic stone manipulation was confirmed. Procedure timeout was performed.  Intravenous antibiotics administered.  General LMA  anesthesia was introduced.  The patient was placed in a low lithotomy position. A  sterile field was created. We prepped and draped his penis, perineum and proximal thigh using iodine, after retracting his very large pannus out of the way to allow access  to the perineum.  This was performed using towel against the skin and we taped over this to avoid tape contact to the skin.  Next, cystourethroscopy was performed using a 21-French rigid scope with offset lens. Inspection of the anterior and posterior  urethra was unremarkable.  Inspection of the urinary bladder revealed distal end bilateral stents, minimal encrustation distally and the left stent was grasped, brought to the level of the urethral meatus and a 0.038 ZIPwire was advanced to the level of  the kidney and exchanged around the catheter and left pyelogram was obtained.  Left retrograde pyelogram demonstrated single left ureter, single system left kidney.  No extravasation. obvious filling defects or narrowing noted. No hydronephrosis.  The ZIPwire was once again advanced and set aside as a safety wire.    Next, the distal end of the right stent was grasped, brought to the level of the urethral meatus and a separate ZIPwire was advanced at the level of the kidney, exchanged for an open-ended catheter and a right retrograde pyelogram was obtained.  Right retrograde pyelogram demonstrated single right ureter, single system right kidney. The ZIPwire was once again advanced to the right side, set aside as a safety wire.  An 8-French feeding tube placed in the urinary bladder for pressure release.   Next, semirigid ureteroscopy was performed to distal half of the right ureter alongside a separate sensor working wire.  No mucosal abnormalities were found   Next, semirigid ureteroscopy was performed distal half of the left ureter alongside  a separate sensor working wire and no mucosal abnormalities were found.   Semirigid scope was then exchanged for an 11/13, 35 cm ureteral access sheath to the level of the mid ureter.  Using the  endoscopic guidance, a flexible digital ureteroscopy was performed of the proximal left ureter.  There is multifocal ureteral stones   in the upper third of the ureter.  This was managed using sequential basketing in a distal to proximal orientation.  Thus completely clearing the left ureteral stents.  Inspection of the kidney did reveal some residual stone fragments  mostly in the lower mid calix.  Most of these were quite small, less than 1/3 mm; however, a few larger than this are amenable to simple basketing removed and set aside.   The residual small volume stones were then further controlled using  dusting technique with holmium laser energy using settings of 1 joule and 10 Hz; such that all remaining fragments were less than 130 mm. Access sheath was removed under continuous vision.  No significant mucosal abnormalities were found.  Next, the access sheath was placed over the right central working wire at the level of the mid ureter on the right side. Using continuous fluoroscopic guidance, a flexible digital ureteroscopy was performed of the proximal ureter.  There was  moderate volume right intrarenal stone, mostly upper, mid and lower pole calices that were amenable to simple basketing used to retrieve approximately 20 fragments.  There was one fragment that was larger than the basket would  accommodate. This was controlled using holmium laser energy using settings of 0.2 joules and 20 Hz.  Half was dusted and half was fragmented and the fragments then being amenable to stone basketing.  Following complete resolution of all accessible fragments, access  sheath was removed under continuous vision.  No significant mucosal abnormalities were found.   Given the bilateral nature of procedure today, it was felt that brief interval stenting with tether stents would be less prudent.  As such, new 5 x 24 Polaris type stents were placed over remaining safety wires using fluoroscopic guidance.  Good  proximal  and distal plane were noted.  Tethers were fashioned together into the lower area of his large prepubic fat pad.  Procedure was terminated.  The patient tolerated the procedure well.  No immediate periprocedural complications.  The patient was taken to  postanesthesia care in stable condition.  PLAN:  Discharge home and removal of the stents in 2 days.   MUK D: 01/31/2021 3:47:11 pm T: 02/01/2021 1:53:00 am  JOB: [id number]/ [id number]

## 2021-02-11 ENCOUNTER — Encounter: Payer: Self-pay | Admitting: Internal Medicine

## 2021-02-11 NOTE — Progress Notes (Signed)
Subjective:    Patient ID: Brandon Schaefer, male    DOB: [birth year], 65 y.o.   MRN: [id number]  This visit occurred during the SARS-CoV-2 public health emergency.  Safety protocols were in place, including screening questions prior to the visit, additional usage of staff PPE, and extensive cleaning of exam room while observing appropriate contact time as indicated for disinfecting solutions.     HPI The patient is here for follow up of their chronic medical problems, including DM, htn, hld, peripheral neuropathy d/t chemo/dm, leg edema, B12 def, anxiety  He is more compliant with a diabetic diet.  Sugars often low- mid 100's.  He usually does not take insulin at all or takes a little - depending on his sugar.     Medications and allergies reviewed with patient and updated if appropriate.  Patient Active Problem List   Diagnosis Date Noted   Rhabdomyolysis 12/04/2020   AKI (acute kidney injury) ([place]) 12/04/2020   Sepsis ([place]) 11/27/2020   BMI 50.0-59.9, adult ([place]) 02/10/2020   History of nephrolithiasis 11/10/2019   B12 deficiency 05/11/2019   Tremor 01/27/2018   Right medial malleolar fracture 11/26/2015   Anxiety 11/26/2015   Hyperlipidemia 11/26/2015   Bilateral leg edema 08/22/2015   Back pain 03/20/2015   Anemia, iron deficiency 05/05/2012   DM (diabetes mellitus) ([place]) 01/18/2012   HTN (hypertension) 01/18/2012   Morbid obesity ([place]) 01/17/2012   History of colon cancer-sigmoid: 12/23/2011   Arthritis of knee, degenerative 08/09/2011   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     carvedilol (COREG) 6.25 MG tablet TAKE 1 TABLET(6.25 MG) BY MOUTH TWICE DAILY WITH A MEAL 180 tablet 1   Continuous Blood Gluc Receiver (FREESTYLE LIBRE 2 READER) DEVI Use as directed to check sugars   E11.65 1 each 0   Continuous Blood Gluc Sensor  (FREESTYLE LIBRE 14 DAY SENSOR) MISC Use as directed to check sugars  E11.65 28 each 5   glipiZIDE (GLUCOTROL) 10 MG tablet Take 1 tablet (10 mg total) by mouth 2 (two) times daily. 60 tablet 11   insulin lispro protamine-lispro (HUMALOG MIX 75/25) (75-25) 100 UNIT/ML SUSP injection INJECT 45 UNITS UNDER THE SKIN EVERY MORNING AND 40 UNITS AT NIGHT (Patient taking differently: Inject 40 Units into the skin in the morning and at bedtime.) 80 mL 5   Insulin Syringe-Needle U-100 (INSULIN SYRINGE 1CC/31GX5/16") 31G X 5/16" 1 ML MISC USE AS DIRECTED TWICE DAILY FOR INSULIN 200 each 2   metFORMIN (GLUCOPHAGE) 1000 MG tablet TAKE 1 TABLET(1000 MG) BY MOUTH TWICE DAILY WITH A MEAL 180 tablet 1   oxyCODONE-acetaminophen (PERCOCET) 5-325 MG tablet Take 1 tablet by mouth every 6 (six) hours as needed for severe pain (Post-operatively). 20 tablet 0   pregabalin (LYRICA) 100 MG capsule TAKE 1 CAPSULE(100 MG) BY MOUTH THREE TIMES DAILY 90 capsule 0   ramipril (ALTACE) 10 MG capsule TAKE 1 CAPSULE(10 MG) BY MOUTH DAILY BEFORE BREAKFAST 90 capsule 1   rosuvastatin (CRESTOR) 20 MG tablet TAKE 1 TABLET(20 MG) BY MOUTH DAILY BEFORE BREAKFAST 90 tablet 1   No current facility-administered medications on file prior to visit.    Past Medical History:  Diagnosis Date   Allergy    Anemia    Anxiety    colon ca dx'd 11/2011   Dental abscess 08/03/2015  Diabetes mellitus    Headache(784.0)    History of blood transfusion    History of kidney stones    Hyperlipidemia    Hypertension    Neuromuscular disorder ([facility])    peripheral neuropathy   Shortness of breath    with exertion    Past Surgical History:  Procedure Laterality Date   COMPLEX WOUND CLOSURE N/A 01/06/2013   Procedure: EXCISION CHRONIC ABDOMINAL WOUND;  Surgeon: Harl Bowie, MD;  [facility];  Service: General;  Laterality: N/A;   CYSTOSCOPY W/ URETERAL STENT PLACEMENT Bilateral 01/04/2013   Procedure: CYSTOSCOPY WITH RETROGRADE  PYELOGRAM/Right double J URETERAL STENT PLACEMENT;  Surgeon: Alexis Frock, MD;  [facility];  Service: Urology;  Laterality: Bilateral;   CYSTOSCOPY W/ URETERAL STENT PLACEMENT Left 11/27/2020   Procedure: CYSTOSCOPY WITH RETROGRADE PYELOGRAM/URETERAL STENT PLACEMENT;  Surgeon: Ceasar Mons, MD;  [facility];  Service: Urology;  Laterality: Left;   CYSTOSCOPY WITH RETROGRADE PYELOGRAM, URETEROSCOPY AND STENT PLACEMENT Right 01/06/2013   Procedure: CYSTOSCOPY WITH RIGHT RETROGRADE PYELOGRAM, URETEROSCOPY AND BILATERAL STENT EXCHANGE, BILATERAL STONE BASKET EXTRACTION ;  Surgeon: Alexis Frock, MD;  [facility];  Service: Urology;  Laterality: Right;   CYSTOSCOPY WITH RETROGRADE PYELOGRAM, URETEROSCOPY AND STENT PLACEMENT Bilateral 01/10/2021   Procedure: CYSTOSCOPY WITH RETROGRADE PYELOGRAM, URETEROSCOPY AND STENT EXCHANGE, FIRST STAGE;  Surgeon: Alexis Frock, MD;  [facility];  Service: Urology;  Laterality: Bilateral;   CYSTOSCOPY WITH RETROGRADE PYELOGRAM, URETEROSCOPY AND STENT PLACEMENT Bilateral 01/31/2021   Procedure: CYSTOSCOPY WITH RETROGRADE PYELOGRAM, URETEROSCOPY AND STENT EXCHANGE;  Surgeon: Alexis Frock, MD;  [facility];  Service: Urology;  Laterality: Bilateral;  90 MINS   FRACTURE SURGERY      ORIF-left radius has pin   HOLMIUM LASER APPLICATION N/A 2/95/1884   Procedure: HOLMIUM LASER APPLICATION;  Surgeon: Alexis Frock, MD;  [facility];  Service: Urology;  Laterality: N/A;   HOLMIUM LASER APPLICATION Bilateral 1/66/0630   Procedure: HOLMIUM LASER APPLICATION;  Surgeon: Alexis Frock, MD;  [facility];  Service: Urology;  Laterality: Bilateral;   HOLMIUM LASER APPLICATION Bilateral 16/04/930   Procedure: HOLMIUM LASER APPLICATION;  Surgeon: Alexis Frock, MD;  [facility];  Service: Urology;  Laterality: Bilateral;   NEPHROLITHOTOMY Left 01/04/2013   Procedure: NEPHROLITHOTOMY PERCUTANEOUS  LEFT 1ST STAGE  PERCUTANEOUS NEPHROSTOLITHOTOMY;  Surgeon: Alexis Frock, MD;  [facility];  Service: Urology;  Laterality: Left;   NEPHROLITHOTOMY Left 01/06/2013   Procedure: NEPHROLITHOTOMY PERCUTANEOUS SECOND LOOK;  Surgeon: Alexis Frock, MD;  [facility];  Service: Urology;  Laterality: Left;   PARTIAL COLECTOMY  01/17/2012   Procedure: PARTIAL COLECTOMY;  Surgeon: Harl Bowie, MD;  [facility];  Service: General;;   PILONIDAL CYST EXCISION     PORT-A-CATH REMOVAL Left 01/06/2013   Procedure: REMOVAL PORT-A-CATH;  Surgeon: Harl Bowie, MD;  [facility];  Service: General;  Laterality: Left;   PORTACATH PLACEMENT  03/19/2012   Procedure: INSERTION PORT-A-CATH;  Surgeon: Harl Bowie, MD;  Location: [facility];  Service: General;  Laterality: N/A;   PROCTOSCOPY  01/17/2012   Procedure: PROCTOSCOPY;  Surgeon: Harl Bowie, MD;  [facility];  Service: General;;    Social History   Socioeconomic History   Marital status: Single    Spouse name: Not on file   Number of children: Not on file   Years of education: Not on file   Highest education level: Not on file  Occupational History  Not on file  Tobacco Use   Smoking status: Never   Smokeless tobacco: Never   Tobacco comments:    NEVER USED TOBACC0  Vaping Use   Vaping Use: Never used  Substance and Sexual Activity   Alcohol use: No    Alcohol/week: 0.0 standard drinks   Drug use: No   Sexual activity: Not on file  Other Topics Concern   Not on file  Social History Narrative   Not on file   Social Determinants of Health   Financial Resource Strain: Not on file  Food Insecurity: Not on file  Transportation Needs: Not on file  Physical Activity: Not on file  Stress: Not on file  Social Connections: Not on file    Family History  Problem Relation Age of Onset   Diabetes Father    Cancer Maternal Aunt        colon   Cancer Maternal Grandmother        colon     Review of Systems  Constitutional:  Negative for chills and fever.  Respiratory:  Negative for cough, shortness of breath and wheezing.   Cardiovascular:  Positive for leg swelling (controlled). Negative for chest pain and palpitations.  Neurological:  Negative for light-headedness and headaches.      Objective:   Vitals:   02/12/21 0753  BP: 130/60  Pulse: 88  Temp: 98.4 F (36.9 C)  SpO2: 93%   BP Readings from Last 3 Encounters:  02/12/21 130/60  01/31/21 132/68  01/24/21 127/71   Wt Readings from Last 3 Encounters:  02/12/21 (!) 368 lb (166.9 kg)  01/31/21 (!) 364 lb (165.1 kg)  01/10/21 (!) 373 lb 3.2 oz (169.3 kg)   Body mass index is 49.91 kg/m.   Physical Exam    Constitutional: Appears well-developed and well-nourished. No distress.  HENT:  Head: Normocephalic and atraumatic.  Neck: Neck supple. No tracheal deviation present. No thyromegaly present.  No cervical lymphadenopathy Cardiovascular: Normal rate, regular rhythm and normal heart sounds.   No murmur heard. No carotid bruit .  No edema Pulmonary/Chest: Effort normal and breath sounds normal. No respiratory distress. No has no wheezes. No rales.  Skin: Skin is warm and dry. Not diaphoretic.  Psychiatric: Normal mood and affect. Behavior is normal.      Assessment & Plan:    See Problem List for Assessment and Plan of chronic medical problems.

## 2021-02-11 NOTE — Patient Instructions (Addendum)
    Flu immunization administered today.     Your a1c was checked.   Medications changes include :   none    Please followup in 4 months

## 2021-02-12 ENCOUNTER — Ambulatory Visit (INDEPENDENT_AMBULATORY_CARE_PROVIDER_SITE_OTHER): Payer: Medicare Other | Admitting: Internal Medicine

## 2021-02-12 ENCOUNTER — Other Ambulatory Visit: Payer: Self-pay

## 2021-02-12 VITALS — BP 130/60 | HR 88 | Temp 98.4°F | Ht 72.0 in | Wt 368.0 lb

## 2021-02-12 DIAGNOSIS — Z794 Long term (current) use of insulin: Secondary | ICD-10-CM | POA: Diagnosis not present

## 2021-02-12 DIAGNOSIS — E119 Type 2 diabetes mellitus without complications: Secondary | ICD-10-CM | POA: Diagnosis not present

## 2021-02-12 DIAGNOSIS — E1142 Type 2 diabetes mellitus with diabetic polyneuropathy: Secondary | ICD-10-CM

## 2021-02-12 DIAGNOSIS — G622 Polyneuropathy due to other toxic agents: Secondary | ICD-10-CM

## 2021-02-12 DIAGNOSIS — F419 Anxiety disorder, unspecified: Secondary | ICD-10-CM

## 2021-02-12 DIAGNOSIS — Z23 Encounter for immunization: Secondary | ICD-10-CM | POA: Diagnosis not present

## 2021-02-12 DIAGNOSIS — I10 Essential (primary) hypertension: Secondary | ICD-10-CM

## 2021-02-12 DIAGNOSIS — R600 Localized edema: Secondary | ICD-10-CM

## 2021-02-12 DIAGNOSIS — E7849 Other hyperlipidemia: Secondary | ICD-10-CM | POA: Diagnosis not present

## 2021-02-12 DIAGNOSIS — E538 Deficiency of other specified B group vitamins: Secondary | ICD-10-CM | POA: Diagnosis not present

## 2021-02-12 LAB — POCT GLYCOSYLATED HEMOGLOBIN (HGB A1C)
HbA1c POC (<> result, manual entry): 6.7 % (ref 4.0–5.6)
HbA1c, POC (controlled diabetic range): 6.7 % (ref 0.0–7.0)
HbA1c, POC (prediabetic range): 6.7 % — AB (ref 5.7–6.4)
Hemoglobin A1C: 6.7 % — AB (ref 4.0–5.6)

## 2021-02-12 MED ORDER — VITAMIN B-12 1000 MCG PO TABS: 1000.0000 ug | ORAL_TABLET | ORAL | Status: AC

## 2021-02-12 NOTE — Addendum Note (Signed)
Addended by: Marcina Millard on: 02/12/2021 08:32 AM   Modules accepted: Orders

## 2021-02-12 NOTE — Addendum Note (Signed)
Addended by: Marcina Millard on: 02/12/2021 08:31 AM   Modules accepted: Orders

## 2021-02-12 NOTE — Assessment & Plan Note (Signed)
Chronic Regular exercise and healthy diet encouraged Check lipid panel  Continue Crestor 20 mg daily 

## 2021-02-12 NOTE — Assessment & Plan Note (Addendum)
Chronic Swelling well controlled Encourage regular activity, weight loss

## 2021-02-12 NOTE — Assessment & Plan Note (Addendum)
Chronic Taking B12 QOD

## 2021-02-12 NOTE — Assessment & Plan Note (Addendum)
Chronic Lab Results  Component Value Date   HGBA1C 7.6 (H) 12/15/2020   Not ideally controlled, but since his last blood test he has changed his diet significantly A1c today Continue metformin 1000 mg twice daily, insulin Humalog mix 75/2540 units twice daily ( which he takes as needed - 0-40 units), glipizide 10 mg twice daily Stressed diabetic diet, weight loss, increased activity Will adjust medications as needed

## 2021-02-12 NOTE — Assessment & Plan Note (Signed)
Chronic Controlled, Stable Continue Lyrica 100 mg 3 times daily

## 2021-02-12 NOTE — Assessment & Plan Note (Signed)
Chronic Controlled, Stable No longer on Cymbalta-anxiety is controlled Monitor off medication

## 2021-02-12 NOTE — Assessment & Plan Note (Signed)
Chronic Blood pressure well controlled CMP Continue Coreg 6.25 mg twice daily, ramipril 10 mg daily 

## 2021-02-13 ENCOUNTER — Other Ambulatory Visit: Payer: Self-pay | Admitting: Internal Medicine

## 2021-02-19 DIAGNOSIS — N200 Calculus of kidney: Secondary | ICD-10-CM | POA: Diagnosis not present

## 2021-02-19 DIAGNOSIS — R82998 Other abnormal findings in urine: Secondary | ICD-10-CM | POA: Diagnosis not present

## 2021-02-22 DIAGNOSIS — M79674 Pain in right toe(s): Secondary | ICD-10-CM | POA: Diagnosis not present

## 2021-02-22 DIAGNOSIS — L84 Corns and callosities: Secondary | ICD-10-CM | POA: Diagnosis not present

## 2021-02-22 DIAGNOSIS — L603 Nail dystrophy: Secondary | ICD-10-CM | POA: Diagnosis not present

## 2021-02-22 DIAGNOSIS — E1351 Other specified diabetes mellitus with diabetic peripheral angiopathy without gangrene: Secondary | ICD-10-CM | POA: Diagnosis not present

## 2021-02-22 DIAGNOSIS — B351 Tinea unguium: Secondary | ICD-10-CM | POA: Diagnosis not present

## 2021-02-26 ENCOUNTER — Telehealth: Payer: Self-pay | Admitting: Internal Medicine

## 2021-02-26 MED ORDER — PREGABALIN 100 MG PO CAPS: ORAL_CAPSULE | ORAL | 0 refills | Status: DC

## 2021-02-26 NOTE — Addendum Note (Signed)
Addended by: Binnie Rail on: 02/26/2021 02:19 PM   Modules accepted: Orders

## 2021-02-26 NOTE — Telephone Encounter (Signed)
Patient calling in to check status of refill request

## 2021-02-27 ENCOUNTER — Other Ambulatory Visit: Payer: Self-pay | Admitting: Internal Medicine

## 2021-03-14 DIAGNOSIS — Z794 Long term (current) use of insulin: Secondary | ICD-10-CM | POA: Diagnosis not present

## 2021-03-14 DIAGNOSIS — E119 Type 2 diabetes mellitus without complications: Secondary | ICD-10-CM | POA: Diagnosis not present

## 2021-03-26 ENCOUNTER — Other Ambulatory Visit: Payer: Self-pay | Admitting: Internal Medicine

## 2021-04-13 DIAGNOSIS — Z794 Long term (current) use of insulin: Secondary | ICD-10-CM | POA: Diagnosis not present

## 2021-04-13 DIAGNOSIS — E119 Type 2 diabetes mellitus without complications: Secondary | ICD-10-CM | POA: Diagnosis not present

## 2021-04-23 ENCOUNTER — Other Ambulatory Visit: Payer: Self-pay | Admitting: Internal Medicine

## 2021-05-08 DIAGNOSIS — Z794 Long term (current) use of insulin: Secondary | ICD-10-CM | POA: Diagnosis not present

## 2021-05-08 DIAGNOSIS — E119 Type 2 diabetes mellitus without complications: Secondary | ICD-10-CM | POA: Diagnosis not present

## 2021-05-14 ENCOUNTER — Encounter: Payer: Self-pay | Admitting: Internal Medicine

## 2021-05-14 NOTE — Progress Notes (Signed)
Subjective:    Patient ID: Brandon Schaefer, male    DOB: 06-Sep-1955, 66 y.o.   MRN: [id number]  This visit occurred during the SARS-CoV-2 public health emergency.  Safety protocols were in place, including screening questions prior to the visit, additional usage of staff PPE, and extensive cleaning of exam room while observing appropriate contact time as indicated for disinfecting solutions.     HPI The patient is here for follow up of their chronic medical problems, including DM, htn, hld, peripheral neuropathy d/t chemo/dm, leg edema, B12 def, anxiety  He is still compliant with a diabetic diet and eating less.  He has continued to lose weight.    Sugars on average 150 for the past 90 days.  It is up and down.  This morning glucose was 190.    Medications and allergies reviewed with patient and updated if appropriate.  Patient Active Problem List   Diagnosis Date Noted   Rhabdomyolysis 12/04/2020   AKI (acute kidney injury) ([place]) 12/04/2020   Sepsis ([employer]) 11/27/2020   BMI 50.0-59.9, adult ([place]) 02/10/2020   History of nephrolithiasis 11/10/2019   B12 deficiency 05/11/2019   Tremor 01/27/2018   Right medial malleolar fracture 11/26/2015   Anxiety 11/26/2015   Hyperlipidemia 11/26/2015   Bilateral leg edema 08/22/2015   Back pain 03/20/2015   Anemia, iron deficiency 05/05/2012   DM (diabetes mellitus) ([place]) 01/18/2012   HTN (hypertension) 01/18/2012   Morbid obesity ([place]) 01/17/2012   History of colon cancer-sigmoid: 12/23/2011   Arthritis of knee, degenerative 08/09/2011   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     carvedilol (COREG) 6.25 MG tablet TAKE 1 TABLET(6.25 MG) BY MOUTH TWICE DAILY WITH A MEAL 180 tablet 1   Continuous Blood Gluc Receiver (FREESTYLE LIBRE 2 READER) DEVI Use as directed to check sugars   E11.65 1 each 0    Continuous Blood Gluc Sensor (FREESTYLE LIBRE 14 DAY SENSOR) MISC Use as directed to check sugars  E11.65 28 each 5   insulin lispro protamine-lispro (HUMALOG MIX 75/25) (75-25) 100 UNIT/ML SUSP injection INJECT 45 UNITS UNDER THE SKIN EVERY MORNING AND 40 UNITS AT NIGHT (Patient taking differently: Inject 40 Units into the skin in the morning and at bedtime.) 80 mL 5   Insulin Syringe-Needle U-100 (INSULIN SYRINGE 1CC/31GX5/16") 31G X 5/16" 1 ML MISC USE AS DIRECTED TWICE DAILY FOR INSULIN 200 each 2   metFORMIN (GLUCOPHAGE) 1000 MG tablet TAKE 1 TABLET(1000 MG) BY MOUTH TWICE DAILY WITH A MEAL 180 tablet 1   Potassium Citrate 15 MEQ (1620 MG) TBCR Take 1 tablet by mouth 2 (two) times daily.     pregabalin (LYRICA) 100 MG capsule TAKE 1 CAPSULE(100 MG) BY MOUTH THREE TIMES DAILY 90 capsule 0   ramipril (ALTACE) 10 MG capsule TAKE 1 CAPSULE(10 MG) BY MOUTH DAILY BEFORE BREAKFAST 90 capsule 1   rosuvastatin (CRESTOR) 20 MG tablet TAKE 1 TABLET(20 MG) BY MOUTH DAILY BEFORE BREAKFAST 90 tablet 1   vitamin B-12 (CYANOCOBALAMIN) 1000 MCG tablet Take 1 tablet (1,000 mcg total) by mouth every other day.     No current facility-administered medications on file prior to visit.    Past Medical History:  Diagnosis Date   Allergy    Anemia    Anxiety    colon ca dx'd 11/2011   Dental  abscess 08/03/2015   Diabetes mellitus    Headache(784.0)    History of blood transfusion    History of kidney stones    Hyperlipidemia    Hypertension    Neuromuscular disorder (HCC)    peripheral neuropathy   Shortness of breath    with exertion    Past Surgical History:  Procedure Laterality Date   COMPLEX WOUND CLOSURE N/A 01/06/2013   Procedure: EXCISION CHRONIC ABDOMINAL WOUND;  Surgeon: Harl Bowie, MD;  Location: [facility];  Service: General;  Laterality: N/A;   CYSTOSCOPY W/ URETERAL STENT PLACEMENT Bilateral 01/04/2013   Procedure: CYSTOSCOPY WITH RETROGRADE PYELOGRAM/Right double J URETERAL STENT  PLACEMENT;  Surgeon: Alexis Frock, MD;  Location: [facility];  Service: Urology;  Laterality: Bilateral;   CYSTOSCOPY W/ URETERAL STENT PLACEMENT Left 11/27/2020   Procedure: CYSTOSCOPY WITH RETROGRADE PYELOGRAM/URETERAL STENT PLACEMENT;  Surgeon: Ceasar Mons, MD;  Location: [facility];  Service: Urology;  Laterality: Left;   CYSTOSCOPY WITH RETROGRADE PYELOGRAM, URETEROSCOPY AND STENT PLACEMENT Right 01/06/2013   Procedure: CYSTOSCOPY WITH RIGHT RETROGRADE PYELOGRAM, URETEROSCOPY AND BILATERAL STENT EXCHANGE, BILATERAL STONE BASKET EXTRACTION ;  Surgeon: Alexis Frock, MD;  Location: [facility];  Service: Urology;  Laterality: Right;   CYSTOSCOPY WITH RETROGRADE PYELOGRAM, URETEROSCOPY AND STENT PLACEMENT Bilateral 01/10/2021   Procedure: CYSTOSCOPY WITH RETROGRADE PYELOGRAM, URETEROSCOPY AND STENT EXCHANGE, FIRST STAGE;  Surgeon: Alexis Frock, MD;  [facility];  Service: Urology;  Laterality: Bilateral;   CYSTOSCOPY WITH RETROGRADE PYELOGRAM, URETEROSCOPY AND STENT PLACEMENT Bilateral 01/31/2021   Procedure: CYSTOSCOPY WITH RETROGRADE PYELOGRAM, URETEROSCOPY AND STENT EXCHANGE;  Surgeon: Alexis Frock, MD;  Location: [facility];  Service: Urology;  Laterality: Bilateral;  90 MINS   FRACTURE SURGERY      ORIF-left radius has pin   HOLMIUM LASER APPLICATION N/A 9/76/7341   Procedure: HOLMIUM LASER APPLICATION;  Surgeon: Alexis Frock, MD;  [facility];  Service: Urology;  Laterality: N/A;   HOLMIUM LASER APPLICATION Bilateral 9/37/9024   Procedure: HOLMIUM LASER APPLICATION;  Surgeon: Alexis Frock, MD;  [facility];  Service: Urology;  Laterality: Bilateral;   HOLMIUM LASER APPLICATION Bilateral 09/73/5329   Procedure: HOLMIUM LASER APPLICATION;  Surgeon: Alexis Frock, MD;  [facility];  Service: Urology;  Laterality: Bilateral;   NEPHROLITHOTOMY Left 01/04/2013   Procedure: NEPHROLITHOTOMY PERCUTANEOUS  LEFT 1ST STAGE PERCUTANEOUS NEPHROSTOLITHOTOMY;  Surgeon: Alexis Frock, MD;  [facility];  Service: Urology;  Laterality: Left;   NEPHROLITHOTOMY Left 01/06/2013   Procedure: NEPHROLITHOTOMY PERCUTANEOUS SECOND LOOK;  Surgeon: Alexis Frock, MD;  Location[facility];  Service: Urology;  Laterality: Left;   PARTIAL COLECTOMY  01/17/2012   Procedure: PARTIAL COLECTOMY;  Surgeon: Harl Bowie, MD;  [facility];  Service: General;;   PILONIDAL CYST EXCISION     PORT-A-CATH REMOVAL Left 01/06/2013   Procedure: REMOVAL PORT-A-CATH;  Surgeon: Harl Bowie, MD;  Location[facility];  Service: General;  Laterality: Left;   PORTACATH PLACEMENT  03/19/2012   Procedure: INSERTION PORT-A-CATH;  Surgeon: Harl Bowie, MD;  Location: [facility];  Service: General;  Laterality: N/A;   PROCTOSCOPY  01/17/2012   Procedure: PROCTOSCOPY;  Surgeon: Harl Bowie, MD;  [facility];  Service: General;;    Social History   Socioeconomic History   Marital status: Single    Spouse name: Not on file   Number of children: Not on file   Years of education: Not on file   Highest education level: Not on  file  Occupational History   Not on file  Tobacco Use   Smoking status: Never   Smokeless tobacco: Never   Tobacco comments:    NEVER USED TOBACC0  Vaping Use   Vaping Use: Never used  Substance and Sexual Activity   Alcohol use: No    Alcohol/week: 0.0 standard drinks   Drug use: No   Sexual activity: Not on file  Other Topics Concern   Not on file  Social History Narrative   Not on file   Social Determinants of Health   Financial Resource Strain: Not on file  Food Insecurity: Not on file  Transportation Needs: Not on file  Physical Activity: Not on file  Stress: Not on file  Social Connections: Not on file    Family History  Problem Relation Age of Onset   Diabetes Father    Cancer Maternal Aunt        colon   Cancer Maternal Grandmother        colon    Review of Systems  Constitutional:  Negative for  chills and fever.  Respiratory:  Negative for cough, shortness of breath and wheezing.   Cardiovascular:  Negative for chest pain, palpitations and leg swelling.  Genitourinary:  Negative for dysuria and hematuria.  Neurological:  Positive for numbness (numbness in feet is worse, sharp shooting pains in toes and fingres).       Restlessness in leg at night  Psychiatric/Behavioral:  Positive for sleep disturbance.       Objective:   Vitals:   05/15/21 0815  BP: 140/72  Pulse: 71  Temp: 98.6 F (37 C)  SpO2: 97%   BP Readings from Last 3 Encounters:  05/15/21 140/72  02/12/21 130/60  01/31/21 132/68   Wt Readings from Last 3 Encounters:  05/15/21 (!) 353 lb (160.1 kg)  02/12/21 (!) 368 lb (166.9 kg)  01/31/21 (!) 364 lb (165.1 kg)   Body mass index is 47.88 kg/m.   Physical Exam    Constitutional: Appears well-developed and well-nourished. No distress.  HENT:  Head: Normocephalic and atraumatic.  Neck: Neck supple. No tracheal deviation present. No thyromegaly present.  No cervical lymphadenopathy Cardiovascular: Normal rate, regular rhythm and normal heart sounds.   2/6 systolic murmur heard. No carotid bruit .  No edema Pulmonary/Chest: Effort normal and breath sounds normal. No respiratory distress. No has no wheezes. No rales.  Skin: Skin is warm and dry. Not diaphoretic.  Psychiatric: Normal mood and affect. Behavior is normal.      Assessment & Plan:    See Problem List for Assessment and Plan of chronic medical problems.

## 2021-05-14 NOTE — Patient Instructions (Addendum)
° °  Blood work was ordered.     Medications changes include :  increase lyrica to 150 mg three times a day   Your prescription(s) have been submitted to your pharmacy. Please take as directed and contact our office if you believe you are having problem(s) with the medication(s).    Please followup in 3 months

## 2021-05-15 ENCOUNTER — Other Ambulatory Visit: Payer: Self-pay

## 2021-05-15 ENCOUNTER — Ambulatory Visit (INDEPENDENT_AMBULATORY_CARE_PROVIDER_SITE_OTHER): Payer: Medicare Other | Admitting: Internal Medicine

## 2021-05-15 VITALS — BP 140/72 | HR 71 | Temp 98.6°F | Ht 72.0 in | Wt 353.0 lb

## 2021-05-15 DIAGNOSIS — E538 Deficiency of other specified B group vitamins: Secondary | ICD-10-CM

## 2021-05-15 DIAGNOSIS — E1142 Type 2 diabetes mellitus with diabetic polyneuropathy: Secondary | ICD-10-CM | POA: Diagnosis not present

## 2021-05-15 DIAGNOSIS — E7849 Other hyperlipidemia: Secondary | ICD-10-CM | POA: Diagnosis not present

## 2021-05-15 DIAGNOSIS — R600 Localized edema: Secondary | ICD-10-CM | POA: Diagnosis not present

## 2021-05-15 DIAGNOSIS — F419 Anxiety disorder, unspecified: Secondary | ICD-10-CM

## 2021-05-15 DIAGNOSIS — G622 Polyneuropathy due to other toxic agents: Secondary | ICD-10-CM

## 2021-05-15 DIAGNOSIS — I10 Essential (primary) hypertension: Secondary | ICD-10-CM | POA: Diagnosis not present

## 2021-05-15 DIAGNOSIS — Z794 Long term (current) use of insulin: Secondary | ICD-10-CM | POA: Diagnosis not present

## 2021-05-15 LAB — COMPREHENSIVE METABOLIC PANEL
ALT: 20 U/L (ref 0–53)
AST: 17 U/L (ref 0–37)
Albumin: 4.4 g/dL (ref 3.5–5.2)
Alkaline Phosphatase: 75 U/L (ref 39–117)
BUN: 19 mg/dL (ref 6–23)
CO2: 30 mEq/L (ref 19–32)
Calcium: 10.1 mg/dL (ref 8.4–10.5)
Chloride: 101 mEq/L (ref 96–112)
Creatinine, Ser: 0.9 mg/dL (ref 0.40–1.50)
GFR: 88.52 mL/min (ref >60.00)
Glucose, Bld: 175 mg/dL — ABNORMAL HIGH (ref 70–99)
Potassium: 4.2 mEq/L (ref 3.5–5.1)
Sodium: 138 mEq/L (ref 135–145)
Total Bilirubin: 0.8 mg/dL (ref 0.2–1.2)
Total Protein: 7.3 g/dL (ref 6.0–8.3)

## 2021-05-15 LAB — CBC WITH DIFFERENTIAL/PLATELET
Basophils Absolute: 0 10*3/uL (ref 0.0–0.1)
Basophils Relative: 0.4 % (ref 0.0–3.0)
Eosinophils Absolute: 0.3 10*3/uL (ref 0.0–0.7)
Eosinophils Relative: 2.5 % (ref 0.0–5.0)
HCT: 41.4 % (ref 39.0–52.0)
Hemoglobin: 13.8 g/dL (ref 13.0–17.0)
Lymphocytes Relative: 15.1 % (ref 12.0–46.0)
Lymphs Abs: 1.6 10*3/uL (ref 0.7–4.0)
MCHC: 33.3 g/dL (ref 30.0–36.0)
MCV: 83.8 fl (ref 78.0–100.0)
Monocytes Absolute: 0.7 10*3/uL (ref 0.1–1.0)
Monocytes Relative: 6.6 % (ref 3.0–12.0)
Neutro Abs: 7.8 10*3/uL — ABNORMAL HIGH (ref 1.4–7.7)
Neutrophils Relative %: 75.4 % (ref 43.0–77.0)
Platelets: 245 10*3/uL (ref 150.0–400.0)
RBC: 4.94 Mil/uL (ref 4.22–5.81)
RDW: 14.8 % (ref 11.5–15.5)
WBC: 10.4 10*3/uL (ref 4.0–10.5)

## 2021-05-15 LAB — HEMOGLOBIN A1C: Hgb A1c MFr Bld: 6.4 % (ref 4.6–6.5)

## 2021-05-15 MED ORDER — PREGABALIN 150 MG PO CAPS: 150.0000 mg | ORAL_CAPSULE | Freq: Three times a day (TID) | ORAL | 2 refills | Status: DC

## 2021-05-15 NOTE — Assessment & Plan Note (Addendum)
Chronic Secondary chemo and DM Not controlled Continue lyrica, but increase to 150 mg tid

## 2021-05-15 NOTE — Assessment & Plan Note (Signed)
Chronic Controlled w/o medication 

## 2021-05-15 NOTE — Assessment & Plan Note (Signed)
Chronic Continue oral B12 QOD

## 2021-05-15 NOTE — Assessment & Plan Note (Addendum)
Chronic a1c today Taking humalog 75/25 40 mg bid, metformin 500 mg bid Sugars controlled at home - sometimes a little high in the morning - he is not eating after dinner - he does not have any alarms of low sugars in the middle of the night

## 2021-05-15 NOTE — Assessment & Plan Note (Signed)
Chronic Working on weight loss Has been eating well and eating less - continues to lose weight

## 2021-05-15 NOTE — Assessment & Plan Note (Addendum)
Chronic Check lipid panel  Continue crestor 20 mg Regular exercise and healthy diet encouraged  Continue weight loss efforts

## 2021-05-15 NOTE — Assessment & Plan Note (Addendum)
Chronic BP controlled Continue coreg 6.25 mg bid, rampipril 10 mg qd cmp

## 2021-05-16 ENCOUNTER — Other Ambulatory Visit: Payer: Self-pay | Admitting: Internal Medicine

## 2021-05-28 DIAGNOSIS — M79675 Pain in left toe(s): Secondary | ICD-10-CM | POA: Diagnosis not present

## 2021-05-28 DIAGNOSIS — B351 Tinea unguium: Secondary | ICD-10-CM | POA: Diagnosis not present

## 2021-05-28 DIAGNOSIS — L84 Corns and callosities: Secondary | ICD-10-CM | POA: Diagnosis not present

## 2021-05-28 DIAGNOSIS — M79674 Pain in right toe(s): Secondary | ICD-10-CM | POA: Diagnosis not present

## 2021-05-28 DIAGNOSIS — E1351 Other specified diabetes mellitus with diabetic peripheral angiopathy without gangrene: Secondary | ICD-10-CM | POA: Diagnosis not present

## 2021-06-07 DIAGNOSIS — Z794 Long term (current) use of insulin: Secondary | ICD-10-CM | POA: Diagnosis not present

## 2021-06-07 DIAGNOSIS — E119 Type 2 diabetes mellitus without complications: Secondary | ICD-10-CM | POA: Diagnosis not present

## 2021-07-07 DIAGNOSIS — Z794 Long term (current) use of insulin: Secondary | ICD-10-CM | POA: Diagnosis not present

## 2021-07-07 DIAGNOSIS — E119 Type 2 diabetes mellitus without complications: Secondary | ICD-10-CM | POA: Diagnosis not present

## 2021-07-23 ENCOUNTER — Other Ambulatory Visit: Payer: Self-pay | Admitting: Internal Medicine

## 2021-07-31 DIAGNOSIS — Z794 Long term (current) use of insulin: Secondary | ICD-10-CM | POA: Diagnosis not present

## 2021-07-31 DIAGNOSIS — E119 Type 2 diabetes mellitus without complications: Secondary | ICD-10-CM | POA: Diagnosis not present

## 2021-08-02 ENCOUNTER — Other Ambulatory Visit: Payer: Self-pay | Admitting: Internal Medicine

## 2021-08-13 ENCOUNTER — Encounter: Payer: Self-pay | Admitting: Internal Medicine

## 2021-08-13 ENCOUNTER — Other Ambulatory Visit: Payer: Self-pay | Admitting: Internal Medicine

## 2021-08-13 NOTE — Progress Notes (Signed)
? ? ?Subjective:  ? ? Patient ID: Brandon Schaefer, male    DOB: 11/04/1955, 66 y.o.   MRN: [id number] ? ? ?This visit occurred during the SARS-CoV-2 public health emergency.  Safety protocols were in place, including screening questions prior to the visit, additional usage of staff PPE, and extensive cleaning of exam room while observing appropriate contact time as indicated for disinfecting solutions. ? ? ?HPI ?Brandon Schaefer is here for  ?Chief Complaint  ?Patient presents with  ? Annual Exam  ? ? ?Knees are hurting more.   ? ?Average sugars 144 for the last 90 days.  ? ?Medications and allergies reviewed with patient and updated if appropriate. ? ? ?Current Outpatient Medications on File Prior to Visit  ?Medication Sig Dispense Refill  ? aspirin EC 81 MG tablet Take 81 mg by mouth daily.    ? carvedilol (COREG) 6.25 MG tablet TAKE 1 TABLET(6.25 MG) BY MOUTH TWICE DAILY WITH A MEAL 180 tablet 1  ? Continuous Blood Gluc Receiver (FREESTYLE LIBRE 2 READER) DEVI Use as directed to check sugars   E11.65 1 each 0  ? Continuous Blood Gluc Sensor (FREESTYLE LIBRE 14 DAY SENSOR) MISC Use as directed to check sugars  E11.65 28 each 5  ? insulin lispro protamine-lispro (HUMALOG MIX 75/25) (75-25) 100 UNIT/ML SUSP injection INJECT 45 UNITS UNDER THE SKIN EVERY MORNING AND 40 UNITS AT NIGHT (Patient taking differently: Inject 40 Units into the skin in the morning and at bedtime.) 80 mL 5  ? Insulin Syringe-Needle U-100 (INSULIN SYRINGE 1CC/31GX5/16") 31G X 5/16" 1 ML MISC USE AS DIRECTED TWICE DAILY FOR INSULIN 200 each 2  ? metFORMIN (GLUCOPHAGE) 1000 MG tablet TAKE 1 TABLET(1000 MG) BY MOUTH TWICE DAILY WITH A MEAL 180 tablet 1  ? Potassium Citrate 15 MEQ (1620 MG) TBCR Take 1 tablet by mouth 2 (two) times daily.    ? pregabalin (LYRICA) 150 MG capsule TAKE ONE CAPSULE BY MOUTH THREE TIMES DAILY, MORNING, NOON AND AT BEDTIME 90 capsule 0  ? ramipril (ALTACE) 10 MG capsule TAKE 1 CAPSULE(10 MG) BY MOUTH DAILY BEFORE BREAKFAST 90  capsule 1  ? rosuvastatin (CRESTOR) 20 MG tablet TAKE 1 TABLET(20 MG) BY MOUTH DAILY BEFORE BREAKFAST 90 tablet 1  ? vitamin B-12 (CYANOCOBALAMIN) 1000 MCG tablet Take 1 tablet (1,000 mcg total) by mouth every other day.    ? ?No current facility-administered medications on file prior to visit.  ? ? ?Review of Systems  ?Constitutional:  Negative for chills and fever.  ?Eyes:  Negative for visual disturbance.  ?Respiratory:  Negative for cough, shortness of breath and wheezing.   ?Cardiovascular:  Negative for chest pain, palpitations and leg swelling.  ?Gastrointestinal:  Negative for abdominal pain, blood in stool, constipation, diarrhea and nausea.  ?     No gerd  ?Genitourinary:  Negative for difficulty urinating, dysuria and hematuria.  ?Musculoskeletal:  Positive for arthralgias and back pain.  ?Skin:  Negative for rash.  ?Neurological:  Negative for light-headedness and headaches.  ?Psychiatric/Behavioral:  Negative for dysphoric mood. The patient is not nervous/anxious.   ? ?   ?Objective:  ? ?Vitals:  ? 08/14/21 0750  ?BP: 134/76  ?Pulse: 65  ?Temp: 98 ?F (36.7 ?C)  ?SpO2: 93%  ? ?Filed Weights  ? 08/14/21 0750  ?Weight: (!) 353 lb (160.1 kg)  ? ?Body mass index is 47.88 kg/m?. ? ?BP Readings from Last 3 Encounters:  ?08/14/21 134/76  ?05/15/21 140/72  ?02/12/21 130/60  ? ? ?Wt Readings from  Last 3 Encounters:  ?08/14/21 (!) 353 lb (160.1 kg)  ?05/15/21 (!) 353 lb (160.1 kg)  ?02/12/21 (!) 368 lb (166.9 kg)  ? ? ?  ?Physical Exam ?Constitutional: He appears well-developed and well-nourished. No distress.  ?HENT:  ?Head: Normocephalic and atraumatic.  ?Right Ear: External ear normal.  ?Left Ear: External ear normal.  ?Mouth/Throat: Oropharynx is clear and moist.  ?Normal ear canals and TM b/l  ?Eyes: Conjunctivae and EOM are normal.  ?Neck: Neck supple. No tracheal deviation present. No thyromegaly present.  ?No carotid bruit  ?Cardiovascular: Normal rate, regular rhythm, normal heart sounds and intact distal  pulses.   ?No murmur heard. ?Pulmonary/Chest: Effort normal and breath sounds normal. No respiratory distress. He has no wheezes. He has no rales.  ?Abdominal: Soft. He exhibits no distension. There is no tenderness.  ?Genitourinary: deferred  ?Musculoskeletal: He exhibits no edema.  ?Lymphadenopathy:   He has no cervical adenopathy.  ?Skin: Skin is warm and dry. He is not diaphoretic.  ?Psychiatric: He has a normal mood and affect. His behavior is normal.  ? ? ? ? ?   ?Assessment & Plan:  ? ?Physical exam: ?Screening blood work  ordered ?Exercise   walking ?Weight stressed weight loss ?Substance abuse   none ? ? ?Reviewed recommended immunizations. ? ? ?Health Maintenance  ?Topic Date Due  ? OPHTHALMOLOGY EXAM  04/22/2018  ? COVID-19 Vaccine (3 - Pfizer risk series) 08/30/2021 (Originally 07/26/2019)  ? COLONOSCOPY (Pts 45-39yr Insurance coverage will need to be confirmed)  11/10/2021 (Originally 12/18/2012)  ? TETANUS/TDAP  11/10/2021 (Originally 03/22/2018)  ? Zoster Vaccines- Shingrix (1 of 2) 11/14/2021 (Originally 02/01/1973)  ? HEMOGLOBIN A1C  11/12/2021  ? INFLUENZA VACCINE  11/13/2021  ? FOOT EXAM  03/06/2022  ? Pneumonia Vaccine 68 Years old  Completed  ? Hepatitis C Screening  Completed  ? HPV VACCINES  Aged Out  ? ? ? ?See Problem List for Assessment and Plan of chronic medical problems. ? ? ?

## 2021-08-13 NOTE — Patient Instructions (Addendum)
? ? ? ?Blood work was ordered.   ? ? ?Medications changes include :   none ? ? ? ?Return in about 3 months (around 11/14/2021) for follow up. ? ? ?Health Maintenance, Male ?Adopting a healthy lifestyle and getting preventive care are important in promoting health and wellness. Ask your health care provider about: ?The right schedule for you to have regular tests and exams. ?Things you can do on your own to prevent diseases and keep yourself healthy. ?What should I know about diet, weight, and exercise? ?Eat a healthy diet ? ?Eat a diet that includes plenty of vegetables, fruits, low-fat dairy products, and lean protein. ?Do not eat a lot of foods that are high in solid fats, added sugars, or sodium. ?Maintain a healthy weight ?Body mass index (BMI) is a measurement that can be used to identify possible weight problems. It estimates body fat based on height and weight. Your health care provider can help determine your BMI and help you achieve or maintain a healthy weight. ?Get regular exercise ?Get regular exercise. This is one of the most important things you can do for your health. Most adults should: ?Exercise for at least 150 minutes each week. The exercise should increase your heart rate and make you sweat (moderate-intensity exercise). ?Do strengthening exercises at least twice a week. This is in addition to the moderate-intensity exercise. ?Spend less time sitting. Even light physical activity can be beneficial. ?Watch cholesterol and blood lipids ?Have your blood tested for lipids and cholesterol at 68 years of age, then have this test every 5 years. ?You may need to have your cholesterol levels checked more often if: ?Your lipid or cholesterol levels are high. ?You are older than 68 years of age. ?You are at high risk for heart disease. ?What should I know about cancer screening? ?Many types of cancers can be detected early and may often be prevented. Depending on your health history and family history, you  may need to have cancer screening at various ages. This may include screening for: ?Colorectal cancer. ?Prostate cancer. ?Skin cancer. ?Lung cancer. ?What should I know about heart disease, diabetes, and high blood pressure? ?Blood pressure and heart disease ?High blood pressure causes heart disease and increases the risk of stroke. This is more likely to develop in people who have high blood pressure readings or are overweight. ?Talk with your health care provider about your target blood pressure readings. ?Have your blood pressure checked: ?Every 3-5 years if you are 62-59 years of age. ?Every year if you are 39 years old or older. ?If you are between the ages of 54 and 32 and are a current or former smoker, ask your health care provider if you should have a one-time screening for abdominal aortic aneurysm (AAA). ?Diabetes ?Have regular diabetes screenings. This checks your fasting blood sugar level. Have the screening done: ?Once every three years after age 32 if you are at a normal weight and have a low risk for diabetes. ?More often and at a younger age if you are overweight or have a high risk for diabetes. ?What should I know about preventing infection? ?Hepatitis B ?If you have a higher risk for hepatitis B, you should be screened for this virus. Talk with your health care provider to find out if you are at risk for hepatitis B infection. ?Hepatitis C ?Blood testing is recommended for: ?Everyone born from [birth year] through 1967. ?Anyone with known risk factors for hepatitis C. ?Sexually transmitted infections (STIs) ?You should  be screened each year for STIs, including gonorrhea and chlamydia, if: ?You are sexually active and are younger than 68 years of age. ?You are older than 68 years of age and your health care provider tells you that you are at risk for this type of infection. ?Your sexual activity has changed since you were last screened, and you are at increased risk for chlamydia or gonorrhea. Ask your  health care provider if you are at risk. ?Ask your health care provider about whether you are at high risk for HIV. Your health care provider may recommend a prescription medicine to help prevent HIV infection. If you choose to take medicine to prevent HIV, you should first get tested for HIV. You should then be tested every 3 months for as long as you are taking the medicine. ?Follow these instructions at home: ?Alcohol use ?Do not drink alcohol if your health care provider tells you not to drink. ?If you drink alcohol: ?Limit how much you have to 0-2 drinks a day. ?Know how much alcohol is in your drink. In the [place] one drink equals one 12 oz bottle of beer (355 mL), one 5 oz glass of wine (148 mL), or one 1? oz glass of hard liquor (44 mL). ?Lifestyle ?Do not use any products that contain nicotine or tobacco. These products include cigarettes, chewing tobacco, and vaping devices, such as e-cigarettes. If you need help quitting, ask your health care provider. ?Do not use street drugs. ?Do not share needles. ?Ask your health care provider for help if you need support or information about quitting drugs. ?General instructions ?Schedule regular health, dental, and eye exams. ?Stay current with your vaccines. ?Tell your health care provider if: ?You often feel depressed. ?You have ever been abused or do not feel safe at home. ?Summary ?Adopting a healthy lifestyle and getting preventive care are important in promoting health and wellness. ?Follow your health care provider's instructions about healthy diet, exercising, and getting tested or screened for diseases. ?Follow your health care provider's instructions on monitoring your cholesterol and blood pressure. ?This information is not intended to replace advice given to you by your health care provider. Make sure you discuss any questions you have with your health care provider. ?Document Revised: 08/21/2020 Document Reviewed: 08/21/2020 ?Elsevier Patient Education ?  [place]. ? ?

## 2021-08-14 ENCOUNTER — Ambulatory Visit (INDEPENDENT_AMBULATORY_CARE_PROVIDER_SITE_OTHER): Payer: Medicare Other | Admitting: Internal Medicine

## 2021-08-14 VITALS — BP 134/76 | HR 65 | Temp 98.0°F | Ht 72.0 in | Wt 353.0 lb

## 2021-08-14 DIAGNOSIS — G622 Polyneuropathy due to other toxic agents: Secondary | ICD-10-CM | POA: Diagnosis not present

## 2021-08-14 DIAGNOSIS — Z794 Long term (current) use of insulin: Secondary | ICD-10-CM

## 2021-08-14 DIAGNOSIS — I10 Essential (primary) hypertension: Secondary | ICD-10-CM | POA: Diagnosis not present

## 2021-08-14 DIAGNOSIS — Z125 Encounter for screening for malignant neoplasm of prostate: Secondary | ICD-10-CM | POA: Diagnosis not present

## 2021-08-14 DIAGNOSIS — R600 Localized edema: Secondary | ICD-10-CM

## 2021-08-14 DIAGNOSIS — E538 Deficiency of other specified B group vitamins: Secondary | ICD-10-CM | POA: Diagnosis not present

## 2021-08-14 DIAGNOSIS — E7849 Other hyperlipidemia: Secondary | ICD-10-CM

## 2021-08-14 DIAGNOSIS — Z0000 Encounter for general adult medical examination without abnormal findings: Secondary | ICD-10-CM | POA: Diagnosis not present

## 2021-08-14 DIAGNOSIS — E1142 Type 2 diabetes mellitus with diabetic polyneuropathy: Secondary | ICD-10-CM | POA: Diagnosis not present

## 2021-08-14 DIAGNOSIS — D500 Iron deficiency anemia secondary to blood loss (chronic): Secondary | ICD-10-CM

## 2021-08-14 LAB — CBC WITH DIFFERENTIAL/PLATELET
Basophils Absolute: 0.1 10*3/uL (ref 0.0–0.1)
Basophils Relative: 0.5 % (ref 0.0–3.0)
Eosinophils Absolute: 0.3 10*3/uL (ref 0.0–0.7)
Eosinophils Relative: 3 % (ref 0.0–5.0)
HCT: 42.8 % (ref 39.0–52.0)
Hemoglobin: 14.5 g/dL (ref 13.0–17.0)
Lymphocytes Relative: 18.5 % (ref 12.0–46.0)
Lymphs Abs: 1.9 10*3/uL (ref 0.7–4.0)
MCHC: 33.9 g/dL (ref 30.0–36.0)
MCV: 83.6 fl (ref 78.0–100.0)
Monocytes Absolute: 0.7 10*3/uL (ref 0.1–1.0)
Monocytes Relative: 6.4 % (ref 3.0–12.0)
Neutro Abs: 7.4 10*3/uL (ref 1.4–7.7)
Neutrophils Relative %: 71.6 % (ref 43.0–77.0)
Platelets: 236 10*3/uL (ref 150.0–400.0)
RBC: 5.12 Mil/uL (ref 4.22–5.81)
RDW: 14.7 % (ref 11.5–15.5)
WBC: 10.3 10*3/uL (ref 4.0–10.5)

## 2021-08-14 LAB — COMPREHENSIVE METABOLIC PANEL
ALT: 17 U/L (ref 0–53)
AST: 15 U/L (ref 0–37)
Albumin: 4.3 g/dL (ref 3.5–5.2)
Alkaline Phosphatase: 78 U/L (ref 39–117)
BUN: 24 mg/dL — ABNORMAL HIGH (ref 6–23)
CO2: 25 mEq/L (ref 19–32)
Calcium: 9.5 mg/dL (ref 8.4–10.5)
Chloride: 102 mEq/L (ref 96–112)
Creatinine, Ser: 0.98 mg/dL (ref 0.40–1.50)
GFR: 79.78 mL/min (ref >60.00)
Glucose, Bld: 160 mg/dL — ABNORMAL HIGH (ref 70–99)
Potassium: 4.1 mEq/L (ref 3.5–5.1)
Sodium: 140 mEq/L (ref 135–145)
Total Bilirubin: 0.6 mg/dL (ref 0.2–1.2)
Total Protein: 6.9 g/dL (ref 6.0–8.3)

## 2021-08-14 LAB — PSA: PSA: 0.23 ng/mL (ref 0.10–4.00)

## 2021-08-14 LAB — LIPID PANEL
Cholesterol: 91 mg/dL (ref 0–200)
HDL: 38.4 mg/dL — ABNORMAL LOW (ref >39.00)
LDL Cholesterol: 28 mg/dL (ref 0–99)
NonHDL: 52.65
Total CHOL/HDL Ratio: 2
Triglycerides: 122 mg/dL (ref 0.0–149.0)
VLDL: 24.4 mg/dL (ref 0.0–40.0)

## 2021-08-14 LAB — MICROALBUMIN / CREATININE URINE RATIO
Creatinine,U: 137.1 mg/dL
Microalb Creat Ratio: 0.6 mg/g (ref 0.0–30.0)
Microalb, Ur: 0.8 mg/dL (ref 0.0–1.9)

## 2021-08-14 LAB — TSH: TSH: 3.55 u[IU]/mL (ref 0.35–5.50)

## 2021-08-14 LAB — HEMOGLOBIN A1C: Hgb A1c MFr Bld: 6.4 % (ref 4.6–6.5)

## 2021-08-14 LAB — VITAMIN B12: Vitamin B-12: 679 pg/mL (ref 211–911)

## 2021-08-14 NOTE — Assessment & Plan Note (Signed)
Chronic ?Lab Results  ?Component Value Date  ? HGBA1C 6.4 05/15/2021  ? ?Sugars well controlled ?Check A1c, urine microalbumin today ?Continue Humalog 75/25 40 units twice daily, metformin 500 mg twice daily ?Stressed regular exercise, diabetic diet ?Will adjust medication as needed ? ?

## 2021-08-14 NOTE — Assessment & Plan Note (Signed)
Chronic Regular exercise and healthy diet encouraged Check lipid panel  Continue Crestor 20 mg daily 

## 2021-08-14 NOTE — Assessment & Plan Note (Signed)
Chronic ?Continue B12 supplementation ?Check B12 level ?

## 2021-08-14 NOTE — Assessment & Plan Note (Signed)
Chronic Controlled Not currently on any medication  

## 2021-08-14 NOTE — Assessment & Plan Note (Signed)
Chronic ?Secondary to chemotherapy and diabetes ?Somewhat controlled ?Continue Lyrica 150 mg 3 times daily ?

## 2021-08-14 NOTE — Assessment & Plan Note (Signed)
Chronic Blood pressure well controlled CMP Continue Coreg 6.25 mg twice daily, ramipril 10 mg daily 

## 2021-08-14 NOTE — Assessment & Plan Note (Signed)
Chronic ?Encouraged weight loss ?

## 2021-08-27 DIAGNOSIS — I739 Peripheral vascular disease, unspecified: Secondary | ICD-10-CM | POA: Diagnosis not present

## 2021-08-27 DIAGNOSIS — B351 Tinea unguium: Secondary | ICD-10-CM | POA: Diagnosis not present

## 2021-08-27 DIAGNOSIS — G629 Polyneuropathy, unspecified: Secondary | ICD-10-CM | POA: Diagnosis not present

## 2021-08-27 DIAGNOSIS — E1351 Other specified diabetes mellitus with diabetic peripheral angiopathy without gangrene: Secondary | ICD-10-CM | POA: Diagnosis not present

## 2021-08-27 DIAGNOSIS — M79674 Pain in right toe(s): Secondary | ICD-10-CM | POA: Diagnosis not present

## 2021-08-30 ENCOUNTER — Other Ambulatory Visit: Payer: Self-pay | Admitting: Internal Medicine

## 2021-08-30 DIAGNOSIS — E119 Type 2 diabetes mellitus without complications: Secondary | ICD-10-CM | POA: Diagnosis not present

## 2021-08-30 DIAGNOSIS — Z794 Long term (current) use of insulin: Secondary | ICD-10-CM | POA: Diagnosis not present

## 2021-09-29 DIAGNOSIS — Z794 Long term (current) use of insulin: Secondary | ICD-10-CM | POA: Diagnosis not present

## 2021-09-29 DIAGNOSIS — E119 Type 2 diabetes mellitus without complications: Secondary | ICD-10-CM | POA: Diagnosis not present

## 2021-10-23 ENCOUNTER — Other Ambulatory Visit: Payer: Self-pay | Admitting: Internal Medicine

## 2021-10-23 DIAGNOSIS — Z794 Long term (current) use of insulin: Secondary | ICD-10-CM | POA: Diagnosis not present

## 2021-10-23 DIAGNOSIS — E119 Type 2 diabetes mellitus without complications: Secondary | ICD-10-CM | POA: Diagnosis not present

## 2021-11-11 NOTE — Patient Instructions (Addendum)
     Blood work was ordered.     Medications changes include :      Your prescription(s) have been sent to your pharmacy.    A referral was ordered for XX.     Someone from that office will call you to schedule an appointment.    Return in about 3 months (around 02/14/2022) for follow up.

## 2021-11-11 NOTE — Progress Notes (Unsigned)
Subjective:    Patient ID: Brandon Schaefer, male    DOB: 1956-02-24, 66 y.o.   MRN: [id number]     HPI Kalden is here for follow up of his chronic medical problems, including DM, htn, hld, peripheral neuropathy d/t chemo/dm, leg edema, B12 def  Sugars 60-260's.  Average 140.    90% of time he is where he should be.   No concerns.   Medications and allergies reviewed with patient and updated if appropriate.  Current Outpatient Medications on File Prior to Visit  Medication Sig Dispense Refill   aspirin EC 81 MG tablet Take 81 mg by mouth daily.     carvedilol (COREG) 6.25 MG tablet TAKE 1 TABLET(6.25 MG) BY MOUTH TWICE DAILY WITH A MEAL 180 tablet 1   Continuous Blood Gluc Receiver (FREESTYLE LIBRE 2 READER) DEVI Use as directed to check sugars   E11.65 1 each 0   Continuous Blood Gluc Sensor (FREESTYLE LIBRE 14 DAY SENSOR) MISC Use as directed to check sugars  E11.65 28 each 5   HUMALOG MIX 75/25 (75-25) 100 UNIT/ML SUSP injection INJECT 45 UNITS UNDER THE SKIN EVERY MORNING AND 40 UNITS AT NIGHT 80 mL 5   Insulin Syringe-Needle U-100 (INSULIN SYRINGE 1CC/31GX5/16") 31G X 5/16" 1 ML MISC USE AS DIRECTED TWICE DAILY FOR INSULIN 200 each 2   metFORMIN (GLUCOPHAGE) 1000 MG tablet TAKE 1 TABLET(1000 MG) BY MOUTH TWICE DAILY WITH A MEAL 180 tablet 1   Potassium Citrate 15 MEQ (1620 MG) TBCR Take 1 tablet by mouth 2 (two) times daily.     pregabalin (LYRICA) 150 MG capsule TAKE ONE CAPSULE BY MOUTH THREE TIMES DAILY( MORNING, NOON, AT BEDTIME) 90 capsule 0   ramipril (ALTACE) 10 MG capsule TAKE 1 CAPSULE(10 MG) BY MOUTH DAILY BEFORE BREAKFAST 90 capsule 1   rosuvastatin (CRESTOR) 20 MG tablet TAKE 1 TABLET(20 MG) BY MOUTH DAILY BEFORE BREAKFAST 90 tablet 1   vitamin B-12 (CYANOCOBALAMIN) 1000 MCG tablet Take 1 tablet (1,000 mcg total) by mouth every other day.     No current facility-administered medications on file prior to visit.     Review of Systems  Constitutional:  Negative  for fever.  Respiratory:  Negative for cough, shortness of breath and wheezing.   Cardiovascular:  Negative for chest pain, palpitations and leg swelling.  Neurological:  Negative for light-headedness and headaches.       Objective:   Vitals:   11/14/21 0749  BP: 120/68  Pulse: (!) 104  Temp: 98.2 F (36.8 C)  SpO2: 94%   BP Readings from Last 3 Encounters:  11/14/21 120/68  08/14/21 134/76  05/15/21 140/72   Wt Readings from Last 3 Encounters:  11/14/21 (!) 359 lb 6.4 oz (163 kg)  08/14/21 (!) 353 lb (160.1 kg)  05/15/21 (!) 353 lb (160.1 kg)   Body mass index is 48.74 kg/m.    Physical Exam Constitutional:      General: He is not in acute distress.    Appearance: Normal appearance. He is not ill-appearing.  HENT:     Head: Normocephalic and atraumatic.  Eyes:     Conjunctiva/sclera: Conjunctivae normal.  Cardiovascular:     Rate and Rhythm: Normal rate and regular rhythm.     Heart sounds: Normal heart sounds. No murmur heard. Pulmonary:     Effort: Pulmonary effort is normal. No respiratory distress.     Breath sounds: Normal breath sounds. No wheezing or rales.  Musculoskeletal:  Right lower leg: No edema.     Left lower leg: No edema.  Skin:    General: Skin is warm and dry.     Findings: No rash.  Neurological:     Mental Status: He is alert. Mental status is at baseline.  Psychiatric:        Mood and Affect: Mood normal.        Lab Results  Component Value Date   WBC 10.3 08/14/2021   HGB 14.5 08/14/2021   HCT 42.8 08/14/2021   PLT 236.0 08/14/2021   GLUCOSE 160 (H) 08/14/2021   CHOL 91 08/14/2021   TRIG 122.0 08/14/2021   HDL 38.40 (L) 08/14/2021   LDLCALC 28 08/14/2021   ALT 17 08/14/2021   AST 15 08/14/2021   NA 140 08/14/2021   K 4.1 08/14/2021   CL 102 08/14/2021   CREATININE 0.98 08/14/2021   BUN 24 (H) 08/14/2021   CO2 25 08/14/2021   TSH 3.55 08/14/2021   PSA 0.23 08/14/2021   HGBA1C 6.4 08/14/2021   MICROALBUR 0.8  08/14/2021     Assessment & Plan:   Will get eye exam   See Problem List for Assessment and Plan of chronic medical problems.

## 2021-11-14 ENCOUNTER — Ambulatory Visit (INDEPENDENT_AMBULATORY_CARE_PROVIDER_SITE_OTHER): Payer: Medicare Other | Admitting: Internal Medicine

## 2021-11-14 ENCOUNTER — Encounter: Payer: Self-pay | Admitting: Internal Medicine

## 2021-11-14 VITALS — BP 120/68 | HR 104 | Temp 98.2°F | Ht 72.0 in | Wt 359.4 lb

## 2021-11-14 DIAGNOSIS — E7849 Other hyperlipidemia: Secondary | ICD-10-CM | POA: Diagnosis not present

## 2021-11-14 DIAGNOSIS — E538 Deficiency of other specified B group vitamins: Secondary | ICD-10-CM | POA: Diagnosis not present

## 2021-11-14 DIAGNOSIS — E1142 Type 2 diabetes mellitus with diabetic polyneuropathy: Secondary | ICD-10-CM

## 2021-11-14 DIAGNOSIS — R600 Localized edema: Secondary | ICD-10-CM | POA: Diagnosis not present

## 2021-11-14 DIAGNOSIS — F419 Anxiety disorder, unspecified: Secondary | ICD-10-CM | POA: Insufficient documentation

## 2021-11-14 DIAGNOSIS — G622 Polyneuropathy due to other toxic agents: Secondary | ICD-10-CM

## 2021-11-14 DIAGNOSIS — I10 Essential (primary) hypertension: Secondary | ICD-10-CM

## 2021-11-14 DIAGNOSIS — Z794 Long term (current) use of insulin: Secondary | ICD-10-CM

## 2021-11-14 DIAGNOSIS — G619 Inflammatory polyneuropathy, unspecified: Secondary | ICD-10-CM | POA: Insufficient documentation

## 2021-11-14 LAB — COMPREHENSIVE METABOLIC PANEL
ALT: 18 U/L (ref 0–53)
AST: 19 U/L (ref 0–37)
Albumin: 4.3 g/dL (ref 3.5–5.2)
Alkaline Phosphatase: 68 U/L (ref 39–117)
BUN: 27 mg/dL — ABNORMAL HIGH (ref 6–23)
CO2: 26 mEq/L (ref 19–32)
Calcium: 10.2 mg/dL (ref 8.4–10.5)
Chloride: 103 mEq/L (ref 96–112)
Creatinine, Ser: 1.11 mg/dL (ref 0.40–1.50)
GFR: 68.58 mL/min (ref >60.00)
Glucose, Bld: 115 mg/dL — ABNORMAL HIGH (ref 70–99)
Potassium: 4.3 mEq/L (ref 3.5–5.1)
Sodium: 139 mEq/L (ref 135–145)
Total Bilirubin: 0.6 mg/dL (ref 0.2–1.2)
Total Protein: 7.3 g/dL (ref 6.0–8.3)

## 2021-11-14 LAB — HEMOGLOBIN A1C: Hgb A1c MFr Bld: 5.9 % (ref 4.6–6.5)

## 2021-11-14 NOTE — Assessment & Plan Note (Signed)
Chronic Controlled, stable Continue  lyrica 150 mg TID

## 2021-11-14 NOTE — Assessment & Plan Note (Signed)
Chronic Controlled w/o medication monitor

## 2021-11-14 NOTE — Assessment & Plan Note (Signed)
Chronic  Lab Results  Component Value Date   HGBA1C 6.4 08/14/2021   Sugars well controlled Testing sugars 1-2 times a day Check A1c Continue metformin 1000 mg bid, humalog 75-25 45 units AM, 40 units PM Stressed regular exercise, diabetic diet

## 2021-11-14 NOTE — Assessment & Plan Note (Signed)
Chronic BP well controlled Continue coreg 6.25 mg bid, ramipril 10 mg daily cmp

## 2021-11-14 NOTE — Assessment & Plan Note (Signed)
Chronic Lipids controlled Continue Crestor 20 mg daily Regular exercise and healthy diet encouraged

## 2021-11-14 NOTE — Assessment & Plan Note (Signed)
Chronic Continue taking B12 daily

## 2021-11-21 ENCOUNTER — Other Ambulatory Visit: Payer: Self-pay | Admitting: Internal Medicine

## 2021-11-21 ENCOUNTER — Telehealth: Payer: Self-pay | Admitting: Internal Medicine

## 2021-11-21 MED ORDER — PREGABALIN 150 MG PO CAPS: ORAL_CAPSULE | ORAL | 0 refills | Status: DC

## 2021-11-21 NOTE — Telephone Encounter (Signed)
Caller & Relationship to patient: Brandon Schaefer  Call back number: [phone]  Date of last office visit: 11/14/21  Date of next office visit: 02/14/22  Medication(s) to be refilled:   pregabalin (LYRICA) 150 MG capsule      Preferred Pharmacy:  [facility] [place], [place] Phone:  [phone]  Fax:  [phone]

## 2021-11-22 DIAGNOSIS — E119 Type 2 diabetes mellitus without complications: Secondary | ICD-10-CM | POA: Diagnosis not present

## 2021-11-22 DIAGNOSIS — Z794 Long term (current) use of insulin: Secondary | ICD-10-CM | POA: Diagnosis not present

## 2021-11-28 ENCOUNTER — Other Ambulatory Visit: Payer: Self-pay

## 2021-11-28 NOTE — Patient Outreach (Signed)
  Care Coordination   11/28/2021 Name: Brandon Schaefer MRN: [id number] DOB: August 11, 1955   Care Coordination Outreach Attempts:  An unsuccessful telephone outreach was attempted today to offer the patient information about available care coordination services as a benefit of their health plan.   Follow Up Plan:  Additional outreach attempts will be made to offer the patient care coordination information and services.   Encounter Outcome:  No Answer  Care Coordination Interventions Activated:  No   Care Coordination Interventions:  No, not indicated    Thea Silversmith, RN, MSN, BSN, CCM Care Coordinator [phone]

## 2021-11-29 DIAGNOSIS — M79674 Pain in right toe(s): Secondary | ICD-10-CM | POA: Diagnosis not present

## 2021-11-29 DIAGNOSIS — I739 Peripheral vascular disease, unspecified: Secondary | ICD-10-CM | POA: Diagnosis not present

## 2021-11-29 DIAGNOSIS — B351 Tinea unguium: Secondary | ICD-10-CM | POA: Diagnosis not present

## 2021-11-29 DIAGNOSIS — G629 Polyneuropathy, unspecified: Secondary | ICD-10-CM | POA: Diagnosis not present

## 2021-11-29 DIAGNOSIS — E1151 Type 2 diabetes mellitus with diabetic peripheral angiopathy without gangrene: Secondary | ICD-10-CM | POA: Diagnosis not present

## 2021-11-30 ENCOUNTER — Other Ambulatory Visit: Payer: Self-pay

## 2021-11-30 NOTE — Patient Outreach (Signed)
  Care Coordination   Initial Visit Note   11/30/2021 Name: Brandon Schaefer MRN: [id number] DOB: 1955-11-26  Brandon Schaefer is a 66 y.o. year old male who sees Brandon Schaefer, Brandon Lick, MD for primary care. I spoke with  Brandon Schaefer by phone today  What matters to the patients health and wellness today?  Denies any care management/care coordination needs at this time.    Goals Addressed             This Visit's Progress    Health Maintence       Care Coordination Interventions: Advised patient to provide appropriate vaccination information to provider or CM team member at next visit Care Gaps reviewed Encouraged to contact RN if care coordination/care management needs in the future         SDOH assessments and interventions completed:  Yes  SDOH Interventions Today    Flowsheet Row Most Recent Value  SDOH Interventions   Food Insecurity Interventions Intervention Not Indicated  Housing Interventions Intervention Not Indicated  Transportation Interventions Intervention Not Indicated        Care Coordination Interventions Activated:  Yes  Care Coordination Interventions:  Yes, provided   Follow up plan: No further intervention required.   Encounter Outcome:  Pt. Visit Completed   Brandon Silversmith, RN, MSN, BSN, CCM Care Coordinator [phone]

## 2021-12-19 ENCOUNTER — Telehealth: Payer: Self-pay | Admitting: Internal Medicine

## 2021-12-19 ENCOUNTER — Other Ambulatory Visit: Payer: Self-pay | Admitting: Internal Medicine

## 2021-12-19 NOTE — Telephone Encounter (Signed)
Caller & Relationship to patient: Brandon Schaefer   Call back number: [phone]   Date of last office visit: 11/14/21   Date of next office visit: 02/14/22   Medication(s) to be refilled:    pregabalin (LYRICA) 150 MG capsule  Please send to [facility] #[id number]  Phone:  [phone]  Fax:  [phone]

## 2021-12-20 MED ORDER — PREGABALIN 150 MG PO CAPS: ORAL_CAPSULE | ORAL | 0 refills | Status: DC

## 2021-12-22 DIAGNOSIS — Z794 Long term (current) use of insulin: Secondary | ICD-10-CM | POA: Diagnosis not present

## 2021-12-22 DIAGNOSIS — E119 Type 2 diabetes mellitus without complications: Secondary | ICD-10-CM | POA: Diagnosis not present

## 2022-01-15 ENCOUNTER — Other Ambulatory Visit: Payer: Self-pay | Admitting: Internal Medicine

## 2022-01-15 DIAGNOSIS — E119 Type 2 diabetes mellitus without complications: Secondary | ICD-10-CM | POA: Diagnosis not present

## 2022-01-15 DIAGNOSIS — Z794 Long term (current) use of insulin: Secondary | ICD-10-CM | POA: Diagnosis not present

## 2022-02-10 ENCOUNTER — Encounter: Payer: Self-pay | Admitting: Hematology & Oncology

## 2022-02-10 ENCOUNTER — Other Ambulatory Visit: Payer: Self-pay | Admitting: Internal Medicine

## 2022-02-13 ENCOUNTER — Encounter: Payer: Self-pay | Admitting: Internal Medicine

## 2022-02-13 NOTE — Patient Instructions (Addendum)
     Flu immunization administered today.      Blood work was ordered.   The lab is on the first floor.    Medications changes include :   mounjaro 2.5 mg weekly, insulin pen.  Tramadol for back pain if severe.      Return in about 6 months (around 08/15/2022) for follow up.

## 2022-02-13 NOTE — Progress Notes (Unsigned)
Subjective:    Patient ID: Brandon Schaefer, male    DOB: 04-Feb-1956, 66 y.o.   MRN: [id number]     HPI Brandon Schaefer is here for follow up of his chronic medical problems, including DM, htn, hld, peripheral neuropathy d/t chemo/dm, leg edema, B12 def  Sugars at home have been averaging in the 140s over the last 90 days.  Throbbing pain down right leg - starts in back, sometimes in knee - goes to toes.  Pain is intermittent.  His back is a little sore.  He has N/T.  No leg weakness, or change in bowel/bladder.  He has never had this before.   Medications and allergies reviewed with patient and updated if appropriate.  Current Outpatient Medications on File Prior to Visit  Medication Sig Dispense Refill   aspirin EC 81 MG tablet Take 81 mg by mouth daily.     carvedilol (COREG) 6.25 MG tablet TAKE 1 TABLET(6.25 MG) BY MOUTH TWICE DAILY WITH A MEAL 180 tablet 1   Continuous Blood Gluc Receiver (FREESTYLE LIBRE 2 READER) DEVI Use as directed to check sugars   E11.65 1 each 0   Continuous Blood Gluc Sensor (FREESTYLE LIBRE 14 DAY SENSOR) MISC Use as directed to check sugars  E11.65 28 each 5   HUMALOG MIX 75/25 (75-25) 100 UNIT/ML SUSP injection INJECT 45 UNITS UNDER THE SKIN EVERY MORNING AND 40 UNITS AT NIGHT 80 mL 5   Insulin Syringe-Needle U-100 (INSULIN SYRINGE 1CC/31GX5/16") 31G X 5/16" 1 ML MISC USE AS DIRECTED TWICE DAILY FOR INSULIN 200 each 2   metFORMIN (GLUCOPHAGE) 1000 MG tablet TAKE 1 TABLET(1000 MG) BY MOUTH TWICE DAILY WITH A MEAL 180 tablet 1   Potassium Citrate 15 MEQ (1620 MG) TBCR Take 1 tablet by mouth 2 (two) times daily.     pregabalin (LYRICA) 150 MG capsule TAKE 1 CAPSULE BY MOUTH THREE TIMES DAILY(MORNING, NOON AND AT BEDTIME) 90 capsule 0   ramipril (ALTACE) 10 MG capsule TAKE 1 CAPSULE(10 MG) BY MOUTH DAILY BEFORE BREAKFAST 90 capsule 1   rosuvastatin (CRESTOR) 20 MG tablet TAKE 1 TABLET(20 MG) BY MOUTH DAILY BEFORE BREAKFAST 90 tablet 1   vitamin B-12  (CYANOCOBALAMIN) 1000 MCG tablet Take 1 tablet (1,000 mcg total) by mouth every other day.     No current facility-administered medications on file prior to visit.     Review of Systems  Constitutional:  Negative for fever.  Respiratory:  Negative for cough, shortness of breath and wheezing.   Cardiovascular:  Negative for chest pain, palpitations and leg swelling (feet only after travel - legs ok).  Musculoskeletal:  Positive for back pain (with pain down right leg).  Neurological:  Positive for numbness. Negative for light-headedness and headaches.       Objective:   Vitals:   02/14/22 0751  BP: 136/70  Pulse: 90  Temp: 98 F (36.7 C)  SpO2: 95%   BP Readings from Last 3 Encounters:  02/14/22 136/70  11/14/21 120/68  08/14/21 134/76   Wt Readings from Last 3 Encounters:  02/14/22 (!) 380 lb (172.4 kg)  11/14/21 (!) 359 lb 6.4 oz (163 kg)  08/14/21 (!) 353 lb (160.1 kg)   Body mass index is 51.54 kg/m.    Physical Exam Constitutional:      General: He is not in acute distress.    Appearance: Normal appearance. He is not ill-appearing.  HENT:     Head: Normocephalic and atraumatic.  Eyes:  Conjunctiva/sclera: Conjunctivae normal.  Cardiovascular:     Rate and Rhythm: Normal rate and regular rhythm.     Heart sounds: Normal heart sounds. No murmur heard. Pulmonary:     Effort: Pulmonary effort is normal. No respiratory distress.     Breath sounds: Normal breath sounds. No wheezing or rales.  Musculoskeletal:        General: Tenderness (Mild tenderness with palpation lumbar spine) present.     Right lower leg: No edema.     Left lower leg: No edema.  Skin:    General: Skin is warm and dry.     Findings: No rash.  Neurological:     Mental Status: He is alert. Mental status is at baseline.     Sensory: Sensory deficit (Slight change in sensation right leg) present.  Psychiatric:        Mood and Affect: Mood normal.        Lab Results  Component  Value Date   WBC 10.3 08/14/2021   HGB 14.5 08/14/2021   HCT 42.8 08/14/2021   PLT 236.0 08/14/2021   GLUCOSE 115 (H) 11/14/2021   CHOL 91 08/14/2021   TRIG 122.0 08/14/2021   HDL 38.40 (L) 08/14/2021   LDLCALC 28 08/14/2021   ALT 18 11/14/2021   AST 19 11/14/2021   NA 139 11/14/2021   K 4.3 11/14/2021   CL 103 11/14/2021   CREATININE 1.11 11/14/2021   BUN 27 (H) 11/14/2021   CO2 26 11/14/2021   TSH 3.55 08/14/2021   PSA 0.23 08/14/2021   HGBA1C 5.9 11/14/2021   MICROALBUR 0.8 08/14/2021     Assessment & Plan:    See Problem List for Assessment and Plan of chronic medical problems.

## 2022-02-14 ENCOUNTER — Ambulatory Visit (INDEPENDENT_AMBULATORY_CARE_PROVIDER_SITE_OTHER): Payer: Medicare Other | Admitting: Internal Medicine

## 2022-02-14 VITALS — BP 136/70 | HR 90 | Temp 98.0°F | Ht 72.0 in | Wt 380.0 lb

## 2022-02-14 DIAGNOSIS — E7849 Other hyperlipidemia: Secondary | ICD-10-CM

## 2022-02-14 DIAGNOSIS — Z794 Long term (current) use of insulin: Secondary | ICD-10-CM

## 2022-02-14 DIAGNOSIS — E119 Type 2 diabetes mellitus without complications: Secondary | ICD-10-CM | POA: Diagnosis not present

## 2022-02-14 DIAGNOSIS — E1142 Type 2 diabetes mellitus with diabetic polyneuropathy: Secondary | ICD-10-CM | POA: Diagnosis not present

## 2022-02-14 DIAGNOSIS — G622 Polyneuropathy due to other toxic agents: Secondary | ICD-10-CM | POA: Diagnosis not present

## 2022-02-14 DIAGNOSIS — I10 Essential (primary) hypertension: Secondary | ICD-10-CM

## 2022-02-14 DIAGNOSIS — Z23 Encounter for immunization: Secondary | ICD-10-CM

## 2022-02-14 DIAGNOSIS — E538 Deficiency of other specified B group vitamins: Secondary | ICD-10-CM

## 2022-02-14 DIAGNOSIS — R600 Localized edema: Secondary | ICD-10-CM

## 2022-02-14 DIAGNOSIS — M5416 Radiculopathy, lumbar region: Secondary | ICD-10-CM

## 2022-02-14 LAB — BASIC METABOLIC PANEL
BUN: 19 mg/dL (ref 6–23)
CO2: 28 mEq/L (ref 19–32)
Calcium: 9.6 mg/dL (ref 8.4–10.5)
Chloride: 105 mEq/L (ref 96–112)
Creatinine, Ser: 0.91 mg/dL (ref 0.40–1.50)
GFR: 86.89 mL/min (ref >60.00)
Glucose, Bld: 122 mg/dL — ABNORMAL HIGH (ref 70–99)
Potassium: 4.5 mEq/L (ref 3.5–5.1)
Sodium: 141 mEq/L (ref 135–145)

## 2022-02-14 LAB — HEMOGLOBIN A1C: Hgb A1c MFr Bld: 6 % (ref 4.6–6.5)

## 2022-02-14 MED ORDER — INSULIN LISPRO PROT & LISPRO (75-25 MIX) 100 UNIT/ML KWIKPEN: PEN_INJECTOR | SUBCUTANEOUS | 11 refills | Status: DC

## 2022-02-14 MED ORDER — TIRZEPATIDE 2.5 MG/0.5ML ~~LOC~~ SOAJ: 2.5000 mg | SUBCUTANEOUS | 0 refills | Status: DC

## 2022-02-14 MED ORDER — INSULIN PEN NEEDLE 32G X 6 MM MISC: 11 refills | Status: DC

## 2022-02-14 MED ORDER — TRAMADOL HCL 50 MG PO TABS: 50.0000 mg | ORAL_TABLET | Freq: Three times a day (TID) | ORAL | 0 refills | Status: AC | PRN

## 2022-02-14 NOTE — Assessment & Plan Note (Signed)
Chronic Blood pressure well controlled CMP Continue Coreg 6.25 mg twice daily, ramipril 10 mg daily

## 2022-02-14 NOTE — Assessment & Plan Note (Signed)
Chronic ?Taking B12 daily-continue ?

## 2022-02-14 NOTE — Assessment & Plan Note (Signed)
Chronic   Lab Results  Component Value Date   HGBA1C 5.9 11/14/2021   Sugars well controlled Testing sugars at least 1 times a day Check A1c  Continue metformin 1000 mg twice daily, Humalog mix 75/2545 units every morning, 40 units at night Stressed regular exercise, diabetic diet

## 2022-02-14 NOTE — Assessment & Plan Note (Signed)
Acute First episode of lower back pain with radiation of pain, numbness/tingling down right leg Discussed treatment Already on Lyrica 150 mg 3 times daily, which is likely helping We will start tramadol 50-100 mg every 8 hours as needed for severe pain-he will only take this if needed Can take Tylenol or ibuprofen for short course if needed Expect to improve If not better can refer to PT, doing imaging or refer to a specialist

## 2022-02-14 NOTE — Assessment & Plan Note (Signed)
Chronic Lipids have been well controlled Continue rosuvastatin 20 mg daily Encouraged healthy diet, regular exercise and weight loss

## 2022-02-14 NOTE — Assessment & Plan Note (Signed)
Chronic Controlled Continue Lyrica 150 mg 3 times daily

## 2022-02-14 NOTE — Assessment & Plan Note (Signed)
Chronic Controlled without medication Continue to monitor

## 2022-02-14 NOTE — Assessment & Plan Note (Signed)
Chronic He is trying to lose weight, but it is not working well Discussed trying medication for diabetes that would also help with weight loss Weight loss is extremely important for his overall health We will try Mounjaro 2.5 mg weekly Discussed possible side effects Can increase if tolerated or stay on a low-dose

## 2022-02-18 DIAGNOSIS — N200 Calculus of kidney: Secondary | ICD-10-CM | POA: Diagnosis not present

## 2022-02-18 NOTE — Patient Instructions (Signed)
Health Maintenance, Male Adopting a healthy lifestyle and getting preventive care are important in promoting health and wellness. Ask your health care provider about: The right schedule for you to have regular tests and exams. Things you can do on your own to prevent diseases and keep yourself healthy. What should I know about diet, weight, and exercise? Eat a healthy diet  Eat a diet that includes plenty of vegetables, fruits, low-fat dairy products, and lean protein. Do not eat a lot of foods that are high in solid fats, added sugars, or sodium. Maintain a healthy weight Body mass index (BMI) is a measurement that can be used to identify possible weight problems. It estimates body fat based on height and weight. Your health care provider can help determine your BMI and help you achieve or maintain a healthy weight. Get regular exercise Get regular exercise. This is one of the most important things you can do for your health. Most adults should: Exercise for at least 150 minutes each week. The exercise should increase your heart rate and make you sweat (moderate-intensity exercise). Do strengthening exercises at least twice a week. This is in addition to the moderate-intensity exercise. Spend less time sitting. Even light physical activity can be beneficial. Watch cholesterol and blood lipids Have your blood tested for lipids and cholesterol at 68 years of age, then have this test every 5 years. You may need to have your cholesterol levels checked more often if: Your lipid or cholesterol levels are high. You are older than 68 years of age. You are at high risk for heart disease. What should I know about cancer screening? Many types of cancers can be detected early and may often be prevented. Depending on your health history and family history, you may need to have cancer screening at various ages. This may include screening for: Colorectal cancer. Prostate cancer. Skin cancer. Lung  cancer. What should I know about heart disease, diabetes, and high blood pressure? Blood pressure and heart disease High blood pressure causes heart disease and increases the risk of stroke. This is more likely to develop in people who have high blood pressure readings or are overweight. Talk with your health care provider about your target blood pressure readings. Have your blood pressure checked: Every 3-5 years if you are 18-39 years of age. Every year if you are 40 years old or older. If you are between the ages of 65 and 75 and are a current or former smoker, ask your health care provider if you should have a one-time screening for abdominal aortic aneurysm (AAA). Diabetes Have regular diabetes screenings. This checks your fasting blood sugar level. Have the screening done: Once every three years after age 45 if you are at a normal weight and have a low risk for diabetes. More often and at a younger age if you are overweight or have a high risk for diabetes. What should I know about preventing infection? Hepatitis B If you have a higher risk for hepatitis B, you should be screened for this virus. Talk with your health care provider to find out if you are at risk for hepatitis B infection. Hepatitis C Blood testing is recommended for: Everyone born from 1945 through 1965. Anyone with known risk factors for hepatitis C. Sexually transmitted infections (STIs) You should be screened each year for STIs, including gonorrhea and chlamydia, if: You are sexually active and are younger than 68 years of age. You are older than 68 years of age and your   health care provider tells you that you are at risk for this type of infection. Your sexual activity has changed since you were last screened, and you are at increased risk for chlamydia or gonorrhea. Ask your health care provider if you are at risk. Ask your health care provider about whether you are at high risk for HIV. Your health care provider  may recommend a prescription medicine to help prevent HIV infection. If you choose to take medicine to prevent HIV, you should first get tested for HIV. You should then be tested every 3 months for as long as you are taking the medicine. Follow these instructions at home: Alcohol use Do not drink alcohol if your health care provider tells you not to drink. If you drink alcohol: Limit how much you have to 0-2 drinks a day. Know how much alcohol is in your drink. In the [place] one drink equals one 12 oz bottle of beer (355 mL), one 5 oz glass of [place] (148 mL), or one 1 oz glass of hard liquor (44 mL). Lifestyle Do not use any products that contain nicotine or tobacco. These products include cigarettes, chewing tobacco, and vaping devices, such as e-cigarettes. If you need help quitting, ask your health care provider. Do not use street drugs. Do not share needles. Ask your health care provider for help if you need support or information about quitting drugs. General instructions Schedule regular health, dental, and eye exams. Stay current with your vaccines. Tell your health care provider if: You often feel depressed. You have ever been abused or do not feel safe at home. Summary Adopting a healthy lifestyle and getting preventive care are important in promoting health and wellness. Follow your health care provider's instructions about healthy diet, exercising, and getting tested or screened for diseases. Follow your health care provider's instructions on monitoring your cholesterol and blood pressure. This information is not intended to replace advice given to you by your health care provider. Make sure you discuss any questions you have with your health care provider. Document Revised: 08/21/2020 Document Reviewed: 08/21/2020 Elsevier Patient Education  2023 [employer]  

## 2022-02-18 NOTE — Progress Notes (Unsigned)
Subjective:   Brandon Schaefer is a 66 y.o. male who presents for an Initial Medicare Annual Wellness Visit. I connected with  Brandon Schaefer on 02/19/22 by a audio enabled telemedicine application and verified that I am speaking with the correct person using two identifiers.  Patient Location: Home  Provider Location: Home Office  I discussed the limitations of evaluation and management by telemedicine. The patient expressed understanding and agreed to proceed.  Review of Systems    Deferred to PCP Cardiac Risk Factors include: diabetes mellitus;dyslipidemia;hypertension;male gender;obesity (BMI >30kg/m2);sedentary lifestyle     Objective:    Today's Vitals   02/19/22 1407  PainSc: 6    There is no height or weight on file to calculate BMI.     02/19/2022    2:16 PM 01/24/2021    1:08 PM 01/10/2021    6:51 AM 12/26/2020    2:07 PM 11/26/2020    9:07 PM 01/09/2017    9:13 AM 07/09/2016    9:16 AM  Advanced Directives  Does Patient Have a Medical Advance Directive? No No No No No No No  Does patient want to make changes to medical advance directive?     No - Patient declined    Would patient like information on creating a medical advance directive? No - Patient declined  No - Patient declined  No - Patient declined      Current Medications (verified) Outpatient Encounter Medications as of 02/19/2022  Medication Sig   carvedilol (COREG) 6.25 MG tablet TAKE 1 TABLET(6.25 MG) BY MOUTH TWICE DAILY WITH A MEAL   Continuous Blood Gluc Receiver (FREESTYLE LIBRE 2 READER) DEVI Use as directed to check sugars   E11.65   Continuous Blood Gluc Sensor (FREESTYLE LIBRE 14 DAY SENSOR) MISC Use as directed to check sugars  E11.65   HUMALOG MIX 75/25 (75-25) 100 UNIT/ML SUSP injection INJECT 45 UNITS UNDER THE SKIN EVERY MORNING AND 40 UNITS AT NIGHT   Insulin Lispro Prot & Lispro (HUMALOG MIX 75/25 KWIKPEN) (75-25) 100 UNIT/ML Kwikpen Inject 45 units Brandon Schaefer in morning and 40 units Brandon Schaefer at night    Insulin Pen Needle 32G X 6 MM MISC UAD for saxenda injections   Insulin Syringe-Needle U-100 (INSULIN SYRINGE 1CC/31GX5/16") 31G X 5/16" 1 ML MISC USE AS DIRECTED TWICE DAILY FOR INSULIN   metFORMIN (GLUCOPHAGE) 1000 MG tablet TAKE 1 TABLET(1000 MG) BY MOUTH TWICE DAILY WITH A MEAL   pregabalin (LYRICA) 150 MG capsule TAKE 1 CAPSULE BY MOUTH THREE TIMES DAILY(MORNING, NOON AND AT BEDTIME)   ramipril (ALTACE) 10 MG capsule TAKE 1 CAPSULE(10 MG) BY MOUTH DAILY BEFORE BREAKFAST   rosuvastatin (CRESTOR) 20 MG tablet TAKE 1 TABLET(20 MG) BY MOUTH DAILY BEFORE BREAKFAST   tirzepatide (MOUNJARO) 2.5 MG/0.5ML Pen Inject 2.5 mg into the skin once a week.   traMADol (ULTRAM) 50 MG tablet Take 1-2 tablets (50-100 mg total) by mouth every 8 (eight) hours as needed for up to 5 days.   vitamin B-12 (CYANOCOBALAMIN) 1000 MCG tablet Take 1 tablet (1,000 mcg total) by mouth every other day.   aspirin EC 81 MG tablet Take 81 mg by mouth daily. (Patient not taking: Reported on 02/19/2022)   Potassium Citrate 15 MEQ (1620 MG) TBCR Take 1 tablet by mouth 2 (two) times daily. (Patient not taking: Reported on 02/19/2022)   No facility-administered encounter medications on file as of 02/19/2022.    Allergies (verified) [place] elastic foam strap [attends briefs small] and Adhesive [tape]   History: Past  Medical History:  Diagnosis Date   Allergy    Anemia    Anxiety    colon ca dx'd 11/2011   Dental abscess 08/03/2015   Diabetes mellitus    Headache(784.0)    History of blood transfusion    History of kidney stones    Hyperlipidemia    Hypertension    Neuromuscular disorder ([place])    peripheral neuropathy   Shortness of breath    with exertion   Past Surgical History:  Procedure Laterality Date   COMPLEX WOUND CLOSURE N/A 01/06/2013   Procedure: EXCISION CHRONIC ABDOMINAL WOUND;  Surgeon: Harl Bowie, MD;  Location: [facility];  Service: General;  Laterality: N/A;   CYSTOSCOPY W/ URETERAL STENT PLACEMENT  Bilateral 01/04/2013   Procedure: CYSTOSCOPY WITH RETROGRADE PYELOGRAM/Right double J URETERAL STENT PLACEMENT;  Surgeon: Alexis Frock, MD;  Location: [facility];  Service: Urology;  Laterality: Bilateral;   CYSTOSCOPY W/ URETERAL STENT PLACEMENT Left 11/27/2020   Procedure: CYSTOSCOPY WITH RETROGRADE PYELOGRAM/URETERAL STENT PLACEMENT;  Surgeon: Ceasar Mons, MD;  Location: [facility];  Service: Urology;  Laterality: Left;   CYSTOSCOPY WITH RETROGRADE PYELOGRAM, URETEROSCOPY AND STENT PLACEMENT Right 01/06/2013   Procedure: CYSTOSCOPY WITH RIGHT RETROGRADE PYELOGRAM, URETEROSCOPY AND BILATERAL STENT EXCHANGE, BILATERAL STONE BASKET EXTRACTION ;  Surgeon: Alexis Frock, MD;  [facility];  Service: Urology;  Laterality: Right;   CYSTOSCOPY WITH RETROGRADE PYELOGRAM, URETEROSCOPY AND STENT PLACEMENT Bilateral 01/10/2021   Procedure: CYSTOSCOPY WITH RETROGRADE PYELOGRAM, URETEROSCOPY AND STENT EXCHANGE, FIRST STAGE;  Surgeon: Alexis Frock, MD;  [facility];  Service: Urology;  Laterality: Bilateral;   CYSTOSCOPY WITH RETROGRADE PYELOGRAM, URETEROSCOPY AND STENT PLACEMENT Bilateral 01/31/2021   Procedure: CYSTOSCOPY WITH RETROGRADE PYELOGRAM, URETEROSCOPY AND STENT EXCHANGE;  Surgeon: Alexis Frock, MD;  [facility];  Service: Urology;  Laterality: Bilateral;  90 MINS   FRACTURE SURGERY      ORIF-left radius has pin   HOLMIUM LASER APPLICATION N/A 05/09/5807   Procedure: HOLMIUM LASER APPLICATION;  Surgeon: Alexis Frock, MD;  [facility];  Service: Urology;  Laterality: N/A;   HOLMIUM LASER APPLICATION Bilateral 9/83/3825   Procedure: HOLMIUM LASER APPLICATION;  Surgeon: Alexis Frock, MD;  [facility];  Service: Urology;  Laterality: Bilateral;   HOLMIUM LASER APPLICATION Bilateral 05/39/7673   Procedure: HOLMIUM LASER APPLICATION;  Surgeon: Alexis Frock, MD;  [facility];  Service: Urology;  Laterality: Bilateral;   NEPHROLITHOTOMY Left 01/04/2013    Procedure: NEPHROLITHOTOMY PERCUTANEOUS  LEFT 1ST STAGE PERCUTANEOUS NEPHROSTOLITHOTOMY;  Surgeon: Alexis Frock, MD;  [facility];  Service: Urology;  Laterality: Left;   NEPHROLITHOTOMY Left 01/06/2013   Procedure: NEPHROLITHOTOMY PERCUTANEOUS SECOND LOOK;  Surgeon: Alexis Frock, MD;  [facility];  Service: Urology;  Laterality: Left;   PARTIAL COLECTOMY  01/17/2012   Procedure: PARTIAL COLECTOMY;  Surgeon: Harl Bowie, MD;  [facility];  Service: General;;   PILONIDAL CYST EXCISION     PORT-A-CATH REMOVAL Left 01/06/2013   Procedure: REMOVAL PORT-A-CATH;  Surgeon: Harl Bowie, MD;  [facility];  Service: General;  Laterality: Left;   PORTACATH PLACEMENT  03/19/2012   Procedure: INSERTION PORT-A-CATH;  Surgeon: Harl Bowie, MD;  Location: [place];  Service: General;  Laterality: N/A;   PROCTOSCOPY  01/17/2012   Procedure: PROCTOSCOPY;  Surgeon: Harl Bowie, MD;  [facility];  Service: General;;   Family History  Problem Relation Age of Onset   Diabetes Father    Cancer Maternal Aunt  colon   Cancer Maternal Grandmother        colon   Social History   Socioeconomic History   Marital status: Single    Spouse name: Not on file   Number of children: Not on file   Years of education: 11   Highest education level: Not on file  Occupational History   Not on file  Tobacco Use   Smoking status: Never   Smokeless tobacco: Never   Tobacco comments:    NEVER USED TOBACC0  Vaping Use   Vaping Use: Never used  Substance and Sexual Activity   Alcohol use: No    Alcohol/week: 0.0 standard drinks of alcohol   Drug use: No   Sexual activity: Not Currently  Other Topics Concern   Not on file  Social History Narrative   Not on file   Social Determinants of Health   Financial Resource Strain: Low Risk  (02/19/2022)   Overall Financial Resource Strain (CARDIA)    Difficulty of Paying Living Expenses: Not hard at  all  Food Insecurity: No Food Insecurity (02/19/2022)   Hunger Vital Sign    Worried About Running Out of Food in the Last Year: Never true    [place] in the Last Year: Never true  Transportation Needs: No Transportation Needs (02/19/2022)   PRAPARE - Hydrologist (Medical): No    Lack of Transportation (Non-Medical): No  Physical Activity: Inactive (02/19/2022)   Exercise Vital Sign    Days of Exercise per Week: 0 days    Minutes of Exercise per Session: 0 min  Stress: No Stress Concern Present (02/19/2022)   [place]    Feeling of Stress : Not at all  Social Connections: Socially Isolated (02/19/2022)   Social Connection and Isolation Panel [NHANES]    Frequency of Communication with Friends and Family: More than three times a week    Frequency of Social Gatherings with Friends and Family: More than three times a week    Attends Religious Services: Never    Marine scientist or Organizations: No    Attends Music therapist: Never    Marital Status: Never married    Tobacco Counseling Counseling given: Not Answered Tobacco comments: NEVER USED TOBACC0   Clinical Intake:  Pre-visit preparation completed: Yes  Pain : 0-10 Pain Score: 6  Pain Type: Chronic pain Pain Location: Leg (leg mainly. Back and radiates down his leg) Pain Descriptors / Indicators: Aching, Numbness, Discomfort Pain Relieving Factors: medcation  Pain Relieving Factors: medcation  Nutritional Status: BMI > 30  Obese Nutritional Risks: None Diabetes: Yes CBG done?: No (phone visit) Did pt. bring in CBG monitor from home?: No (phone visit)  How often do you need to have someone help you when you read instructions, pamphlets, or other written materials from your doctor or pharmacy?: 1 - Never  Diabetic?Yes Nutrition Risk Assessment:  Has the patient had any N/V/D within the last 2  months?  No  Does the patient have any non-healing wounds?  No  Has the patient had any unintentional weight loss or weight gain?  No   Diabetes:  Is the patient diabetic?  Yes  If diabetic, was a CBG obtained today?  No , phone visit Did the patient bring in their glucometer from home?  No , phone visit How often do you monitor your CBG's? Reports about every hour; has CGM.  Financial Strains and Diabetes Management:  Are you having any financial strains with the device, your supplies or your medication? No .  Does the patient want to be seen by Chronic Care Management for management of their diabetes?  No  Would the patient like to be referred to a Nutritionist or for Diabetic Management?  No   Diabetic Exams:  Diabetic Eye Exam: Completed 2019. Education provided; patient states he will call to make an appointment Diabetic Foot Exam: Completed 03/06/21   Interpreter Needed?: No  Information entered by :: Emelia Loron RN   Activities of Daily Living    02/19/2022    2:29 PM  In your present state of health, do you have any difficulty performing the following activities:  Hearing? 0  Vision? 0  Difficulty concentrating or making decisions? 0  Walking or climbing stairs? 1  Comment uses walker  Dressing or bathing? 0  Doing errands, shopping? 0  Preparing Food and eating ? N  Using the Toilet? N  In the past six months, have you accidently leaked urine? N  Do you have problems with loss of bowel control? N  Managing your Medications? N  Managing your Finances? N  Housekeeping or managing your Housekeeping? N    Patient Care Team: Binnie Rail, MD as PCP - General (Internal Medicine)  Indicate any recent Medical Services you may have received from other than [facility] providers in the past year (date may be approximate).     Assessment:   This is a routine wellness examination for [facility].  Hearing/Vision screen No results found.  Dietary issues and exercise  activities discussed: Current Exercise Habits: The patient does not participate in regular exercise at present, Exercise limited by: orthopedic condition(s)   Goals Addressed             This Visit's Progress    Patient Stated       Maintain current health status      Depression Screen    02/19/2022    2:27 PM 02/14/2022    8:33 AM 08/14/2021    7:55 AM 05/15/2021    8:38 AM 05/12/2020    7:55 AM 05/12/2019    8:05 AM 02/09/2019    9:41 AM  PHQ 2/9 Scores  PHQ - 2 Score 0 0 0 0 0 0 0  PHQ- 9 Score    0       Fall Risk    02/19/2022    2:30 PM 02/14/2022    8:33 AM 08/14/2021    7:55 AM 08/14/2021    7:51 AM 08/11/2020    8:10 AM  Fall Risk   Falls in the past year? 0 0 0 0 0  Number falls in past yr: 0 0 0 0 0  Injury with Fall? 0 0 0 0 0  Risk for fall due to : Impaired balance/gait;Impaired mobility No Fall Risks No Fall Risks No Fall Risks No Fall Risks  Follow up Falls evaluation completed Falls evaluation completed Falls evaluation completed Falls evaluation completed Falls evaluation completed    [place]:  Any stairs in or around the home? No  If so, are there any without handrails?  N/A Home free of loose throw rugs in walkways, pet beds, electrical cords, etc? Yes  Adequate lighting in your home to reduce risk of falls? Yes   ASSISTIVE DEVICES UTILIZED TO PREVENT FALLS:  Life alert? No  Use of a cane, walker or w/c?  Yes  Grab bars in the bathroom? Yes  Shower chair or bench in shower? Yes  Elevated toilet seat or a handicapped toilet? Yes   Cognitive Function:        02/19/2022    2:14 PM  6CIT Screen  What Year? 0 points  What month? 0 points  What time? 0 points  Count back from 20 0 points  Months in reverse 0 points  Repeat phrase 0 points  Total Score 0 points    Immunizations Immunization History  Administered Date(s) Administered   Fluad Quad(high Dose 65+) 02/09/2019, 02/10/2020, 02/12/2021, 02/14/2022    H1N1 03/22/2008   Influenza Split 01/20/2012   Influenza,inj,Quad PF,6+ Mos 12/22/2014, 12/22/2015, 01/27/2017, 01/27/2018   Influenza-Unspecified 12/31/2012, 01/12/2014   PFIZER(Purple Top)SARS-COV-2 Vaccination 06/06/2019, 06/28/2019   Pneumococcal Conjugate-13 03/16/2013   Pneumococcal Polysaccharide-23 11/15/2009, 01/05/2013, 02/09/2019   Tdap 03/22/2008    TDAP status: Due, Education has been provided regarding the importance of this vaccine. Advised may receive this vaccine at local pharmacy or Health Dept. Aware to provide a copy of the vaccination record if obtained from local pharmacy or Health Dept. Verbalized acceptance and understanding.  Flu Vaccine status: Up to date  Pneumococcal vaccine status: Up to date  Covid-19 vaccine status: Information provided on how to obtain vaccines.   Qualifies for Shingles Vaccine? Yes   Zostavax completed No   Shingrix Completed?: No.    Education has been provided regarding the importance of this vaccine. Patient has been advised to call insurance company to determine out of pocket expense if they have not yet received this vaccine. Advised may also receive vaccine at local pharmacy or Health Dept. Verbalized acceptance and understanding.  Screening Tests Health Maintenance  Topic Date Due   Zoster Vaccines- Shingrix (1 of 2) Never done   COLONOSCOPY (Pts 45-55yr Insurance coverage will need to be confirmed)  12/18/2012   TETANUS/TDAP  03/22/2018   OPHTHALMOLOGY EXAM  04/22/2018   COVID-19 Vaccine (3 - Pfizer series) 08/23/2019   FOOT EXAM  03/06/2022   Diabetic kidney evaluation - Urine ACR  08/15/2022   HEMOGLOBIN A1C  08/15/2022   Diabetic kidney evaluation - GFR measurement  02/15/2023   Medicare Annual Wellness (AWV)  02/20/2023   Pneumonia Vaccine 66 Years old  Completed   INFLUENZA VACCINE  Completed   Hepatitis C Screening  Completed   HPV VACCINES  Aged Out    Health Maintenance  Health Maintenance Due  Topic  Date Due   Zoster Vaccines- Shingrix (1 of 2) Never done   COLONOSCOPY (Pts 45-467yrInsurance coverage will need to be confirmed)  12/18/2012   TETANUS/TDAP  03/22/2018   OPHTHALMOLOGY EXAM  04/22/2018   COVID-19 Vaccine (3 - Pfizer series) 08/23/2019    Colorectal Screening: 1 year recall; provided education. Patient states he will discuss colonoscopy with his oncologist.  Lung Cancer Screening: (Low Dose CT Chest recommended if Age 68-80ears, 30 pack-year currently smoking OR have quit w/in 15years.) does not qualify.   Additional Screening:  Hepatitis C Screening: does qualify; Completed 12/22/15  Vision Screening: Recommended annual ophthalmology exams for early detection of glaucoma and other disorders of the eye. Is the patient up to date with their annual eye exam?  No  Who is the provider or what is the name of the office in which the patient attends annual eye exams? [facility] with [facility] provider, would they like to be referred to a provider to establish  care? No , declines stating he will call to make an appointment  Dental Screening: Recommended annual dental exams for proper oral hygiene  Community Resource Referral / Chronic Care Management: CRR required this visit?  No   CCM required this visit?  No      Plan:     I have personally reviewed and noted the following in the patient's chart:   Medical and social history Use of alcohol, tobacco or illicit drugs  Current medications and supplements including opioid prescriptions. Patient is not currently taking opioid prescriptions. Functional ability and status Nutritional status Physical activity Advanced directives List of other physicians Hospitalizations, surgeries, and ER visits in previous 12 months Vitals Screenings to include cognitive, depression, and falls Referrals and appointments  In addition, I have reviewed and discussed with patient certain preventive protocols,  quality metrics, and best practice recommendations. A written personalized care plan for preventive services as well as general preventive health recommendations were provided to patient.     Michiel Cowboy, RN   02/19/2022   Nurse Notes:  Mr. Zeidman , Thank you for taking time to come for your Medicare Wellness Visit. I appreciate your ongoing commitment to your health goals. Please review the following plan we discussed and let me know if I can assist you in the future.   These are the goals we discussed:  Goals      Health Maintence     Care Coordination Interventions: Advised patient to provide appropriate vaccination information to provider or CM team member at next visit Care Gaps reviewed Encouraged to contact RN if care coordination/care management needs in the future      Patient Stated     Maintain current health status        This is a list of the screening recommended for you and due dates:  Health Maintenance  Topic Date Due   Zoster (Shingles) Vaccine (1 of 2) Never done   Colon Cancer Screening  12/18/2012   Tetanus Vaccine  03/22/2018   Eye exam for diabetics  04/22/2018   COVID-19 Vaccine (3 - Pfizer series) 08/23/2019   Complete foot exam   03/06/2022   Yearly kidney health urinalysis for diabetes  08/15/2022   Hemoglobin A1C  08/15/2022   Yearly kidney function blood test for diabetes  02/15/2023   Medicare Annual Wellness Visit  02/20/2023   Pneumonia Vaccine  Completed   Flu Shot  Completed   Hepatitis C Screening: USPSTF Recommendation to screen - Ages 18-79 yo.  Completed   HPV Vaccine  Aged Out

## 2022-02-19 ENCOUNTER — Ambulatory Visit (INDEPENDENT_AMBULATORY_CARE_PROVIDER_SITE_OTHER): Payer: Medicare Other | Admitting: *Deleted

## 2022-02-19 DIAGNOSIS — Z0000 Encounter for general adult medical examination without abnormal findings: Secondary | ICD-10-CM

## 2022-03-10 ENCOUNTER — Other Ambulatory Visit: Payer: Self-pay | Admitting: Internal Medicine

## 2022-03-16 DIAGNOSIS — E119 Type 2 diabetes mellitus without complications: Secondary | ICD-10-CM | POA: Diagnosis not present

## 2022-03-16 DIAGNOSIS — Z794 Long term (current) use of insulin: Secondary | ICD-10-CM | POA: Diagnosis not present

## 2022-04-09 DIAGNOSIS — Z794 Long term (current) use of insulin: Secondary | ICD-10-CM | POA: Diagnosis not present

## 2022-04-09 DIAGNOSIS — E119 Type 2 diabetes mellitus without complications: Secondary | ICD-10-CM | POA: Diagnosis not present

## 2022-04-17 DIAGNOSIS — E119 Type 2 diabetes mellitus without complications: Secondary | ICD-10-CM | POA: Diagnosis not present

## 2022-04-17 DIAGNOSIS — H2513 Age-related nuclear cataract, bilateral: Secondary | ICD-10-CM | POA: Diagnosis not present

## 2022-04-18 DIAGNOSIS — G629 Polyneuropathy, unspecified: Secondary | ICD-10-CM | POA: Diagnosis not present

## 2022-04-18 DIAGNOSIS — B351 Tinea unguium: Secondary | ICD-10-CM | POA: Diagnosis not present

## 2022-04-18 DIAGNOSIS — I739 Peripheral vascular disease, unspecified: Secondary | ICD-10-CM | POA: Diagnosis not present

## 2022-04-18 DIAGNOSIS — E1351 Other specified diabetes mellitus with diabetic peripheral angiopathy without gangrene: Secondary | ICD-10-CM | POA: Diagnosis not present

## 2022-04-18 DIAGNOSIS — E1151 Type 2 diabetes mellitus with diabetic peripheral angiopathy without gangrene: Secondary | ICD-10-CM | POA: Diagnosis not present

## 2022-04-18 DIAGNOSIS — M79674 Pain in right toe(s): Secondary | ICD-10-CM | POA: Diagnosis not present

## 2022-04-27 LAB — HM DIABETES EYE EXAM

## 2022-05-01 ENCOUNTER — Other Ambulatory Visit: Payer: Self-pay | Admitting: Internal Medicine

## 2022-05-09 ENCOUNTER — Encounter: Payer: Self-pay | Admitting: Internal Medicine

## 2022-05-09 DIAGNOSIS — Z794 Long term (current) use of insulin: Secondary | ICD-10-CM | POA: Diagnosis not present

## 2022-05-09 DIAGNOSIS — E119 Type 2 diabetes mellitus without complications: Secondary | ICD-10-CM | POA: Diagnosis not present

## 2022-05-09 NOTE — Progress Notes (Signed)
Outside notes received. Information abstracted. Notes sent to scan. 

## 2022-05-29 ENCOUNTER — Other Ambulatory Visit: Payer: Self-pay | Admitting: Internal Medicine

## 2022-06-08 DIAGNOSIS — E119 Type 2 diabetes mellitus without complications: Secondary | ICD-10-CM | POA: Diagnosis not present

## 2022-06-08 DIAGNOSIS — Z794 Long term (current) use of insulin: Secondary | ICD-10-CM | POA: Diagnosis not present

## 2022-06-23 ENCOUNTER — Other Ambulatory Visit: Payer: Self-pay | Admitting: Internal Medicine

## 2022-06-24 ENCOUNTER — Telehealth: Payer: Self-pay | Admitting: Internal Medicine

## 2022-06-24 NOTE — Telephone Encounter (Signed)
Patient called wanting to know why his medication refill for pregabalin (LYRICA) 150 MG capsule was denied and discontinued. Patient stated the pharmacy told him he needs to call his PCP to see why it was denied. Patient stated that Dr. Quay Burow never told him that he needs to stop taking this medication. Patient is requesting a call back from Dr. Quay Burow nurse.  Best callback number is [phone].

## 2022-06-24 NOTE — Telephone Encounter (Signed)
Spoke with patient today and he was able to pick up his script.

## 2022-07-02 ENCOUNTER — Telehealth: Payer: Self-pay | Admitting: Internal Medicine

## 2022-07-02 DIAGNOSIS — Z794 Long term (current) use of insulin: Secondary | ICD-10-CM | POA: Diagnosis not present

## 2022-07-02 DIAGNOSIS — E119 Type 2 diabetes mellitus without complications: Secondary | ICD-10-CM | POA: Diagnosis not present

## 2022-07-02 NOTE — Telephone Encounter (Signed)
Rhuverlyn from [facility] [employer] called. She said they need the patient's glucose monitor downloads from the start of 2024 and medical notes from 11/06/21-06/08/22.  Best callback is [phone] ext [phone].

## 2022-07-03 NOTE — Telephone Encounter (Signed)
Info faxed today

## 2022-07-10 ENCOUNTER — Other Ambulatory Visit: Payer: Self-pay | Admitting: Internal Medicine

## 2022-07-10 ENCOUNTER — Telehealth: Payer: Self-pay | Admitting: Internal Medicine

## 2022-07-10 ENCOUNTER — Other Ambulatory Visit: Payer: Self-pay

## 2022-07-10 MED ORDER — ROSUVASTATIN CALCIUM 20 MG PO TABS: ORAL_TABLET | ORAL | 1 refills | Status: DC

## 2022-07-10 MED ORDER — CARVEDILOL 6.25 MG PO TABS: ORAL_TABLET | ORAL | 1 refills | Status: DC

## 2022-07-10 MED ORDER — RAMIPRIL 10 MG PO CAPS: ORAL_CAPSULE | ORAL | 1 refills | Status: DC

## 2022-07-10 MED ORDER — METFORMIN HCL 1000 MG PO TABS: ORAL_TABLET | ORAL | 1 refills | Status: DC

## 2022-07-10 NOTE — Telephone Encounter (Signed)
Patient called and said his pharmacy told him his medications were denied. He would like to know why they were denied. His last appointment was 02/14/22 and his next one is 08/15/22. He would like a call back at [phone]. Medications are carvedilol (COREG) 6.25 MG tablet , metFORMIN (GLUCOPHAGE) 1000 MG tablet, ramipril (ALTACE) 10 MG capsule , rosuvastatin (CRESTOR) 20 MG tablet . He would like for them to be sent to [place], [address] AT [address] if they can be filled.

## 2022-07-10 NOTE — Telephone Encounter (Signed)
Scripts sent in for patient today.  Patient informed.

## 2022-07-16 ENCOUNTER — Telehealth: Payer: Self-pay | Admitting: Internal Medicine

## 2022-07-16 ENCOUNTER — Other Ambulatory Visit: Payer: Self-pay | Admitting: Internal Medicine

## 2022-07-16 MED ORDER — PREGABALIN 150 MG PO CAPS: ORAL_CAPSULE | ORAL | 0 refills | Status: DC

## 2022-07-16 NOTE — Telephone Encounter (Signed)
Prescription Request  07/16/2022  LOV: 02/14/2022  What is the name of the medication or equipment?  pregabalin (LYRICA) 150 MG capsule  Have you contacted your pharmacy to request a refill? Yes   Which pharmacy would you like this sent to?  [facility] with [employer]    Patient notified that their request is being sent to the clinical staff for review and that they should receive a response within 2 business days.   Please advise at Mobile [phone] (mobile)    Patient would like a callback from a nurse at above number.

## 2022-08-01 DIAGNOSIS — Z794 Long term (current) use of insulin: Secondary | ICD-10-CM | POA: Diagnosis not present

## 2022-08-01 DIAGNOSIS — E119 Type 2 diabetes mellitus without complications: Secondary | ICD-10-CM | POA: Diagnosis not present

## 2022-08-07 ENCOUNTER — Telehealth: Payer: Self-pay | Admitting: Internal Medicine

## 2022-08-07 NOTE — Telephone Encounter (Signed)
Contacted Brandon Schaefer to schedule their annual wellness visit. Appointment made for 08/14/2022.  [facility] Care Guide [facility] AWV TEAM Direct Dial: [phone]

## 2022-08-14 ENCOUNTER — Ambulatory Visit (INDEPENDENT_AMBULATORY_CARE_PROVIDER_SITE_OTHER): Payer: Medicare Other

## 2022-08-14 VITALS — Ht 72.0 in | Wt 370.0 lb

## 2022-08-14 DIAGNOSIS — Z0000 Encounter for general adult medical examination without abnormal findings: Secondary | ICD-10-CM

## 2022-08-14 NOTE — Progress Notes (Unsigned)
Subjective:    Patient ID: Brandon Schaefer, male    DOB: [birth year], 67 y.o.   MRN: [id number]     HPI Brandon Schaefer is here for follow up of his chronic medical problems.  Mounjaro prescribed in [place] expensive.  Eating well.  Sugars average 140.  Walks when his feet do not hurt - from neuropathy.  Medications and allergies reviewed with patient and updated if appropriate.  Current Outpatient Medications on File Prior to Visit  Medication Sig Dispense Refill   aspirin EC 81 MG tablet Take 81 mg by mouth daily.     carvedilol (COREG) 6.25 MG tablet TAKE 1 TABLET(6.25 MG) BY MOUTH TWICE DAILY WITH A MEAL 180 tablet 1   Continuous Blood Gluc Receiver (FREESTYLE LIBRE 2 READER) DEVI Use as directed to check sugars   E11.65 1 each 0   Continuous Blood Gluc Sensor (FREESTYLE LIBRE 14 DAY SENSOR) MISC Use as directed to check sugars  E11.65 28 each 5   HUMALOG MIX 75/25 (75-25) 100 UNIT/ML SUSP injection INJECT 45 UNITS UNDER THE SKIN EVERY MORNING AND 40 UNITS AT NIGHT 80 mL 5   Insulin Lispro Prot & Lispro (HUMALOG MIX 75/25 KWIKPEN) (75-25) 100 UNIT/ML Kwikpen Inject 45 units [place] in morning and 40 units [place] at night 30 mL 11   Insulin Pen Needle 32G X 6 MM MISC UAD for saxenda injections 30 each 11   Insulin Syringe-Needle U-100 (INSULIN SYRINGE 1CC/31GX5/16") 31G X 5/16" 1 ML MISC USE AS DIRECTED TWICE DAILY FOR INSULIN 200 each 2   metFORMIN (GLUCOPHAGE) 1000 MG tablet TAKE 1 TABLET(1000 MG) BY MOUTH TWICE DAILY WITH A MEAL 180 tablet 1   pregabalin (LYRICA) 150 MG capsule TAKE 1 CAPSULE BY MOUTH THREE TIMES DAILY( MORNING, NOON, AND BEDTIME) 270 capsule 0   ramipril (ALTACE) 10 MG capsule TAKE 1 CAPSULE(10 MG) BY MOUTH DAILY BEFORE BREAKFAST 90 capsule 1   rosuvastatin (CRESTOR) 20 MG tablet TAKE 1 TABLET(20 MG) BY MOUTH DAILY BEFORE BREAKFAST 90 tablet 1   vitamin B-12 (CYANOCOBALAMIN) 1000 MCG tablet Take 1 tablet (1,000 mcg total) by mouth every other day.     No current  facility-administered medications on file prior to visit.     Review of Systems  Constitutional:  Negative for fever.  Respiratory:  Negative for cough, shortness of breath and wheezing.   Cardiovascular:  Negative for chest pain, palpitations and leg swelling.  Neurological:  Negative for light-headedness and headaches.       Objective:   Vitals:   08/15/22 0756 08/15/22 0820  BP: (!) 140/78 128/78  Pulse: 100   Temp: 98.7 F (37.1 C)   SpO2: 97%    BP Readings from Last 3 Encounters:  08/15/22 128/78  02/14/22 136/70  11/14/21 120/68   Wt Readings from Last 3 Encounters:  08/15/22 (!) 387 lb (175.5 kg)  08/14/22 (!) 370 lb (167.8 kg)  02/14/22 (!) 380 lb (172.4 kg)   Body mass index is 52.49 kg/m.    Physical Exam Constitutional:      General: He is not in acute distress.    Appearance: Normal appearance. He is not ill-appearing.  HENT:     Head: Normocephalic and atraumatic.  Eyes:     Conjunctiva/sclera: Conjunctivae normal.  Cardiovascular:     Rate and Rhythm: Normal rate and regular rhythm.     Heart sounds: Normal heart sounds.  Pulmonary:     Effort: Pulmonary effort is normal. No respiratory distress.  Breath sounds: Normal breath sounds. No wheezing or rales.  Musculoskeletal:     Right lower leg: No edema.     Left lower leg: No edema.  Skin:    General: Skin is warm and dry.     Findings: No rash.  Neurological:     Mental Status: He is alert. Mental status is at baseline.  Psychiatric:        Mood and Affect: Mood normal.        Lab Results  Component Value Date   WBC 10.3 08/14/2021   HGB 14.5 08/14/2021   HCT 42.8 08/14/2021   PLT 236.0 08/14/2021   GLUCOSE 122 (H) 02/14/2022   CHOL 91 08/14/2021   TRIG 122.0 08/14/2021   HDL 38.40 (L) 08/14/2021   LDLCALC 28 08/14/2021   ALT 18 11/14/2021   AST 19 11/14/2021   NA 141 02/14/2022   K 4.5 02/14/2022   CL 105 02/14/2022   CREATININE 0.91 02/14/2022   BUN 19 02/14/2022    CO2 28 02/14/2022   TSH 3.55 08/14/2021   PSA 0.23 08/14/2021   HGBA1C 6.0 02/14/2022   MICROALBUR 0.8 08/14/2021     Assessment & Plan:    See Problem List for Assessment and Plan of chronic medical problems.

## 2022-08-14 NOTE — Progress Notes (Signed)
I connected with  Rashi Giuliani on 08/14/22 by a audio enabled telemedicine application and verified that I am speaking with the correct person using two identifiers.  Patient Location: Home  Provider Location: Office/Clinic  I discussed the limitations of evaluation and management by telemedicine. The patient expressed understanding and agreed to proceed.  Subjective:   Brandon Schaefer is a 67 y.o. male who presents for Medicare Annual/Subsequent preventive examination.  Review of Systems     Cardiac Risk Factors include: advanced age (>57men, >71 women);diabetes mellitus;dyslipidemia;hypertension;male gender;obesity (BMI >30kg/m2);sedentary lifestyle     Objective:    Today's Vitals   08/14/22 0918 08/14/22 0920  Weight: (!) 370 lb (167.8 kg)   Height: 6' (1.829 m)   PainSc: 8  8    Body mass index is 50.18 kg/m.     08/14/2022    9:20 AM 02/19/2022    2:16 PM 01/24/2021    1:08 PM 01/10/2021    6:51 AM 12/26/2020    2:07 PM 11/26/2020    9:07 PM 01/09/2017    9:13 AM  Advanced Directives  Does Patient Have a Medical Advance Directive? No No No No No No No  Does patient want to make changes to medical advance directive?      No - Patient declined   Would patient like information on creating a medical advance directive? No - Patient declined No - Patient declined  No - Patient declined  No - Patient declined     Current Medications (verified) Outpatient Encounter Medications as of 08/14/2022  Medication Sig   aspirin EC 81 MG tablet Take 81 mg by mouth daily. (Patient not taking: Reported on 02/19/2022)   carvedilol (COREG) 6.25 MG tablet TAKE 1 TABLET(6.25 MG) BY MOUTH TWICE DAILY WITH A MEAL   Continuous Blood Gluc Receiver (FREESTYLE LIBRE 2 READER) DEVI Use as directed to check sugars   E11.65   Continuous Blood Gluc Sensor (FREESTYLE LIBRE 14 DAY SENSOR) MISC Use as directed to check sugars  E11.65   HUMALOG MIX 75/25 (75-25) 100 UNIT/ML SUSP injection INJECT 45 UNITS  UNDER THE SKIN EVERY MORNING AND 40 UNITS AT NIGHT   Insulin Lispro Prot & Lispro (HUMALOG MIX 75/25 KWIKPEN) (75-25) 100 UNIT/ML Kwikpen Inject 45 units [place] in morning and 40 units [place] at night   Insulin Pen Needle 32G X 6 MM MISC UAD for saxenda injections   Insulin Syringe-Needle U-100 (INSULIN SYRINGE 1CC/31GX5/16") 31G X 5/16" 1 ML MISC USE AS DIRECTED TWICE DAILY FOR INSULIN   metFORMIN (GLUCOPHAGE) 1000 MG tablet TAKE 1 TABLET(1000 MG) BY MOUTH TWICE DAILY WITH A MEAL   Potassium Citrate 15 MEQ (1620 MG) TBCR Take 1 tablet by mouth 2 (two) times daily. (Patient not taking: Reported on 02/19/2022)   pregabalin (LYRICA) 150 MG capsule TAKE 1 CAPSULE BY MOUTH THREE TIMES DAILY( MORNING, NOON, AND BEDTIME)   ramipril (ALTACE) 10 MG capsule TAKE 1 CAPSULE(10 MG) BY MOUTH DAILY BEFORE BREAKFAST   rosuvastatin (CRESTOR) 20 MG tablet TAKE 1 TABLET(20 MG) BY MOUTH DAILY BEFORE BREAKFAST   tirzepatide (MOUNJARO) 2.5 MG/0.5ML Pen Inject 2.5 mg into the skin once a week.   vitamin B-12 (CYANOCOBALAMIN) 1000 MCG tablet Take 1 tablet (1,000 mcg total) by mouth every other day.   No facility-administered encounter medications on file as of 08/14/2022.    Allergies (verified) [place] elastic foam strap [attends briefs small] and Adhesive [tape]   History: Past Medical History:  Diagnosis Date   Allergy  Anemia    Anxiety    colon ca dx'd 11/2011   Dental abscess 08/03/2015   Diabetes mellitus    Headache(784.0)    History of blood transfusion    History of kidney stones    Hyperlipidemia    Hypertension    Neuromuscular disorder (HCC)    peripheral neuropathy   Shortness of breath    with exertion   Past Surgical History:  Procedure Laterality Date   COMPLEX WOUND CLOSURE N/A 01/06/2013   Procedure: EXCISION CHRONIC ABDOMINAL WOUND;  Surgeon: Shelly Rubenstein, MD;  Location: [facility];  Service: General;  Laterality: N/A;   CYSTOSCOPY W/ URETERAL STENT PLACEMENT Bilateral 01/04/2013    Procedure: CYSTOSCOPY WITH RETROGRADE PYELOGRAM/Right double J URETERAL STENT PLACEMENT;  Surgeon: Sebastian Ache, MD;  Location: [facility];  Service: Urology;  Laterality: Bilateral;   CYSTOSCOPY W/ URETERAL STENT PLACEMENT Left 11/27/2020   Procedure: CYSTOSCOPY WITH RETROGRADE PYELOGRAM/URETERAL STENT PLACEMENT;  Surgeon: Rene Paci, MD;  Location: [facility];  Service: Urology;  Laterality: Left;   CYSTOSCOPY WITH RETROGRADE PYELOGRAM, URETEROSCOPY AND STENT PLACEMENT Right 01/06/2013   Procedure: CYSTOSCOPY WITH RIGHT RETROGRADE PYELOGRAM, URETEROSCOPY AND BILATERAL STENT EXCHANGE, BILATERAL STONE BASKET EXTRACTION ;  Surgeon: Sebastian Ache, MD;  Location: [facility];  Service: Urology;  Laterality: Right;   CYSTOSCOPY WITH RETROGRADE PYELOGRAM, URETEROSCOPY AND STENT PLACEMENT Bilateral 01/10/2021   Procedure: CYSTOSCOPY WITH RETROGRADE PYELOGRAM, URETEROSCOPY AND STENT EXCHANGE, FIRST STAGE;  Surgeon: Sebastian Ache, MD;  Location: [facility];  Service: Urology;  Laterality: Bilateral;   CYSTOSCOPY WITH RETROGRADE PYELOGRAM, URETEROSCOPY AND STENT PLACEMENT Bilateral 01/31/2021   Procedure: CYSTOSCOPY WITH RETROGRADE PYELOGRAM, URETEROSCOPY AND STENT EXCHANGE;  Surgeon: Sebastian Ache, MD;  Location: [facility];  Service: Urology;  Laterality: Bilateral;  90 MINS   FRACTURE SURGERY      ORIF-left radius has pin   HOLMIUM LASER APPLICATION N/A 01/04/2013   Procedure: HOLMIUM LASER APPLICATION;  Surgeon: Sebastian Ache, MD;  Location: [facility];  Service: Urology;  Laterality: N/A;   HOLMIUM LASER APPLICATION Bilateral 01/10/2021   Procedure: HOLMIUM LASER APPLICATION;  Surgeon: Sebastian Ache, MD;  Location: [facility];  Service: Urology;  Laterality: Bilateral;   HOLMIUM LASER APPLICATION Bilateral 01/31/2021   Procedure: HOLMIUM LASER APPLICATION;  Surgeon: Sebastian Ache, MD;  Location: [facility];  Service: Urology;  Laterality: Bilateral;   NEPHROLITHOTOMY Left 01/04/2013   Procedure: NEPHROLITHOTOMY  PERCUTANEOUS  LEFT 1ST STAGE PERCUTANEOUS NEPHROSTOLITHOTOMY;  Surgeon: Sebastian Ache, MD;  Location: [facility];  Service: Urology;  Laterality: Left;   NEPHROLITHOTOMY Left 01/06/2013   Procedure: NEPHROLITHOTOMY PERCUTANEOUS SECOND LOOK;  Surgeon: Sebastian Ache, MD;  Location: [facility];  Service: Urology;  Laterality: Left;   PARTIAL COLECTOMY  01/17/2012   Procedure: PARTIAL COLECTOMY;  Surgeon: Shelly Rubenstein, MD;  Location: [facility];  Service: General;;   PILONIDAL CYST EXCISION     PORT-A-CATH REMOVAL Left 01/06/2013   Procedure: REMOVAL PORT-A-CATH;  Surgeon: Shelly Rubenstein, MD;  Location: [facility];  Service: General;  Laterality: Left;   PORTACATH PLACEMENT  03/19/2012   Procedure: INSERTION PORT-A-CATH;  Surgeon: Shelly Rubenstein, MD;  Location: [facility];  Service: General;  Laterality: N/A;   PROCTOSCOPY  01/17/2012   Procedure: PROCTOSCOPY;  Surgeon: Shelly Rubenstein, MD;  Location[facility];  Service: General;;   Family History  Problem Relation Age of Onset   Diabetes Father    Cancer Maternal Aunt        colon   Cancer  Maternal Grandmother        colon   Social History   Socioeconomic History   Marital status: Single    Spouse name: Not on file   Number of children: Not on file   Years of education: 11   Highest education level: 10th grade  Occupational History   Not on file  Tobacco Use   Smoking status: Never   Smokeless tobacco: Never   Tobacco comments:    NEVER USED TOBACC0  Vaping Use   Vaping Use: Never used  Substance and Sexual Activity   Alcohol use: No    Alcohol/week: 0.0 standard drinks of alcohol   Drug use: No   Sexual activity: Not Currently  Other Topics Concern   Not on file  Social History Narrative   Not on file   Social Determinants of Health   Financial Resource Strain: Low Risk  (08/14/2022)   Overall Financial Resource Strain (CARDIA)    Difficulty of Paying Living Expenses: Not hard at all  Food Insecurity: No  Food Insecurity (08/14/2022)   Hunger Vital Sign    Worried About Running Out of Food in the Last Year: Never true    Ran Out of Food in the Last Year: Never true  Transportation Needs: No Transportation Needs (08/14/2022)   PRAPARE - Administrator, Civil Service (Medical): No    Lack of Transportation (Non-Medical): No  Physical Activity: Inactive (08/14/2022)   Exercise Vital Sign    Days of Exercise per Week: 0 days    Minutes of Exercise per Session: 0 min  Stress: No Stress Concern Present (08/14/2022)   [employer] of Occupational Health - Occupational Stress Questionnaire    Feeling of Stress : Not at all  Social Connections: Socially Isolated (08/14/2022)   Social Connection and Isolation Panel [NHANES]    Frequency of Communication with Friends and Family: Once a week    Frequency of Social Gatherings with Friends and Family: More than three times a week    Attends Religious Services: Never    Database administrator or Organizations: No    Attends Engineer, structural: Never    Marital Status: Never married    Tobacco Counseling Counseling given: Not Answered Tobacco comments: NEVER USED TOBACC0   Clinical Intake:  Pre-visit preparation completed: Yes  Pain : 0-10 Pain Score: 8  Pain Type: Neuropathic pain Pain Location: Leg Pain Orientation: Left, Right Pain Radiating Towards: feet     BMI - recorded: 50.18 Nutritional Status: BMI > 30  Obese Nutritional Risks: None Diabetes: Yes CBG done?: No Did pt. bring in CBG monitor from home?: No  How often do you need to have someone help you when you read instructions, pamphlets, or other written materials from your doctor or pharmacy?: 1 - Never What is the last grade level you completed in school?: HSG  Nutrition Risk Assessment:  Has the patient had any N/V/D within the last 2 months?  No  Does the patient have any non-healing wounds?  No  Has the patient had any unintentional weight loss  or weight gain?  No   Diabetes:  Is the patient diabetic?  Yes  If diabetic, was a CBG obtained today?  No  Did the patient bring in their glucometer from home?  No  How often do you monitor your CBG's? Freestyle Libre Continuous Monitor.   Financial Strains and Diabetes Management:  Are you having any financial strains with the device, your  supplies or your medication? No .  Does the patient want to be seen by Chronic Care Management for management of their diabetes?  No  Would the patient like to be referred to a Nutritionist or for Diabetic Management?  No   Diabetic Exams:  Diabetic Eye Exam: Completed 04/27/2022 Diabetic Foot Exam: Completed 03/06/2021; overdue.   Interpreter Needed?: No  Information entered by :: Susie Cassette, LPN.   Activities of Daily Living    08/14/2022    9:24 AM 02/19/2022    2:29 PM  In your present state of health, do you have any difficulty performing the following activities:  Hearing? 0 0  Vision? 0 0  Difficulty concentrating or making decisions? 0 0  Walking or climbing stairs? 0 1  Comment  uses walker  Dressing or bathing? 0 0  Doing errands, shopping? 0 0  Preparing Food and eating ? N N  Using the Toilet? N N  In the past six months, have you accidently leaked urine? N N  Do you have problems with loss of bowel control? N N  Managing your Medications? N N  Managing your Finances? N N  Housekeeping or managing your Housekeeping? N N    Patient Care Team: Pincus Sanes, MD as PCP - General (Internal Medicine) Ander Purpura, OD as Consulting Physician (Optometry) Berneice Heinrich, Delbert Phenix., MD as Consulting Physician (Urology) Larey Dresser, DPM as Consulting Physician (Podiatry)  Indicate any recent Medical Services you may have received from other than [facility] providers in the past year (date may be approximate).     Assessment:   This is a routine wellness examination for [facility].  Hearing/Vision screen Hearing Screening -  Comments:: Denies hearing difficulties   Vision Screening - Comments:: Does not wears rx glasses - up to date with routine eye exams with [facility]   Dietary issues and exercise activities discussed: Current Exercise Habits: The patient does not participate in regular exercise at present, Exercise limited by: orthopedic condition(s);Other - see comments;neurologic condition(s) (Polyneuropathy in legs and feet)   Goals Addressed             This Visit's Progress    Patient has no healthcare goals at this time.        Depression Screen    08/14/2022    9:22 AM 02/19/2022    2:27 PM 02/14/2022    8:33 AM 08/14/2021    7:55 AM 05/15/2021    8:38 AM 05/12/2020    7:55 AM 05/12/2019    8:05 AM  PHQ 2/9 Scores  PHQ - 2 Score 0 0 0 0 0 0 0  PHQ- 9 Score 0    0      Fall Risk    08/14/2022    9:21 AM 02/19/2022    2:30 PM 02/14/2022    8:33 AM 08/14/2021    7:55 AM 08/14/2021    7:51 AM  Fall Risk   Falls in the past year? 0 0 0 0 0  Number falls in past yr: 0 0 0 0 0  Injury with Fall? 0 0 0 0 0  Risk for fall due to : No Fall Risks Impaired balance/gait;Impaired mobility No Fall Risks No Fall Risks No Fall Risks  Follow up Falls prevention discussed Falls evaluation completed Falls evaluation completed Falls evaluation completed Falls evaluation completed    FALL RISK PREVENTION PERTAINING TO THE HOME:  Any stairs in or around the home? No  If so, are  there any without handrails? No  Home free of loose throw rugs in walkways, pet beds, electrical cords, etc? Yes  Adequate lighting in your home to reduce risk of falls? Yes   ASSISTIVE DEVICES UTILIZED TO PREVENT FALLS:  Life alert? No  Use of a cane, walker or w/c? No  Grab bars in the bathroom? No  Shower chair or bench in shower? Yes  Elevated toilet seat or a handicapped toilet? Yes   TIMED UP AND GO:  Was the test performed? No . Telephonic Visit  Cognitive Function:        08/14/2022    9:25 AM 02/19/2022    2:14  PM  6CIT Screen  What Year? 0 points 0 points  What month? 0 points 0 points  What time? 0 points 0 points  Count back from 20 0 points 0 points  Months in reverse 0 points 0 points  Repeat phrase 0 points 0 points  Total Score 0 points 0 points    Immunizations Immunization History  Administered Date(s) Administered   Fluad Quad(high Dose 65+) 02/09/2019, 02/10/2020, 02/12/2021, 02/14/2022   H1N1 03/22/2008   Influenza Split 01/20/2012   Influenza,inj,Quad PF,6+ Mos 12/22/2014, 12/22/2015, 01/27/2017, 01/27/2018   Influenza-Unspecified 12/31/2012, 01/12/2014   PFIZER(Purple Top)SARS-COV-2 Vaccination 06/06/2019, 06/28/2019   Pneumococcal Conjugate-13 03/16/2013   Pneumococcal Polysaccharide-23 11/15/2009, 01/05/2013, 02/09/2019   Tdap 03/22/2008    TDAP status: Due, Education has been provided regarding the importance of this vaccine. Advised may receive this vaccine at local pharmacy or Health Dept. Aware to provide a copy of the vaccination record if obtained from local pharmacy or Health Dept. Verbalized acceptance and understanding.  Flu Vaccine status: Up to date  Pneumococcal vaccine status: Up to date  Covid-19 vaccine status: Completed vaccines  Qualifies for Shingles Vaccine? Yes   Zostavax completed No   Shingrix Completed?: No.    Education has been provided regarding the importance of this vaccine. Patient has been advised to call insurance company to determine out of pocket expense if they have not yet received this vaccine. Advised may also receive vaccine at local pharmacy or Health Dept. Verbalized acceptance and understanding.  Screening Tests Health Maintenance  Topic Date Due   Zoster Vaccines- Shingrix (1 of 2) Never done   COLONOSCOPY (Pts 45-58yrs Insurance coverage will need to be confirmed)  12/18/2012   DTaP/Tdap/Td (2 - Td or Tdap) 03/22/2018   COVID-19 Vaccine (3 - 2023-24 season) 12/14/2021   FOOT EXAM  03/06/2022   Diabetic kidney evaluation  - Urine ACR  08/15/2022   HEMOGLOBIN A1C  08/15/2022   INFLUENZA VACCINE  11/14/2022   Diabetic kidney evaluation - eGFR measurement  02/15/2023   OPHTHALMOLOGY EXAM  04/28/2023   Medicare Annual Wellness (AWV)  08/14/2023   Pneumonia Vaccine 37+ Years old  Completed   Hepatitis C Screening  Completed   HPV VACCINES  Aged Out    Health Maintenance  Health Maintenance Due  Topic Date Due   Zoster Vaccines- Shingrix (1 of 2) Never done   COLONOSCOPY (Pts 45-3yrs Insurance coverage will need to be confirmed)  12/18/2012   DTaP/Tdap/Td (2 - Td or Tdap) 03/22/2018   COVID-19 Vaccine (3 - 2023-24 season) 12/14/2021   FOOT EXAM  03/06/2022   Diabetic kidney evaluation - Urine ACR  08/15/2022    Colorectal cancer screening: Type of screening: Colonoscopy. Completed 12/29/2011. Repeat every 1 years Patient declined screening.  Lung Cancer Screening: (Low Dose CT Chest recommended if Age 36-80 years,  30 pack-year currently smoking OR have quit w/in 15years.) does not qualify.   Lung Cancer Screening Referral: No  Additional Screening:  Hepatitis C Screening: does qualify; Completed 12/22/2015  Vision Screening: Recommended annual ophthalmology exams for early detection of glaucoma and other disorders of the eye. Is the patient up to date with their annual eye exam?  Yes  Who is the provider or what is the name of the office in which the patient attends annual eye exams? [facility] Willaim Bane, MD. If pt is not established with a provider, would they like to be referred to a provider to establish care? No .   Dental Screening: Recommended annual dental exams for proper oral hygiene  Community Resource Referral / Chronic Care Management: CRR required this visit?  No   CCM required this visit?  No      Plan:     I have personally reviewed and noted the following in the patient's chart:   Medical and social history Use of alcohol, tobacco or illicit drugs  Current medications  and supplements including opioid prescriptions. Patient is not currently taking opioid prescriptions. Functional ability and status Nutritional status Physical activity Advanced directives List of other physicians Hospitalizations, surgeries, and ER visits in previous 12 months Vitals Screenings to include cognitive, depression, and falls Referrals and appointments  In addition, I have reviewed and discussed with patient certain preventive protocols, quality metrics, and best practice recommendations. A written personalized care plan for preventive services as well as general preventive health recommendations were provided to patient.     Mickeal Needy, LPN   04/15/9145   Nurse Notes: Normal cognitive status assessed by direct observation via telephone conversation by this Nurse Health Advisor. No abnormalities found.

## 2022-08-14 NOTE — Patient Instructions (Addendum)
Brandon Schaefer , Thank you for taking time to come for your Medicare Wellness Visit. I appreciate your ongoing commitment to your health goals. Please review the following plan we discussed and let me know if I can assist you in the future.   These are the goals we discussed:  Goals      Patient has no healthcare goals at this time.        This is a list of the screening recommended for you and due dates:  Health Maintenance  Topic Date Due   Zoster (Shingles) Vaccine (1 of 2) Never done   Colon Cancer Screening  12/18/2012   DTaP/Tdap/Td vaccine (2 - Td or Tdap) 03/22/2018   COVID-19 Vaccine (3 - 2023-24 season) 12/14/2021   Complete foot exam   03/06/2022   Yearly kidney health urinalysis for diabetes  08/15/2022   Hemoglobin A1C  08/15/2022   Flu Shot  11/14/2022   Yearly kidney function blood test for diabetes  02/15/2023   Eye exam for diabetics  04/28/2023   Medicare Annual Wellness Visit  08/14/2023   Pneumonia Vaccine  Completed   Hepatitis C Screening: USPSTF Recommendation to screen - Ages 18-79 yo.  Completed   HPV Vaccine  Aged Out    Advanced directives: No; Advance directive discussed with you today. Even though you declined this today please call our office should you change your mind and we can give you the proper paperwork for you to fill out.  Conditions/risks identified: Yes, Type II Diabetes Mellitus  Next appointment: Follow up in one year for your annual wellness visit.   Preventive Care 19 Years and Older, Male  Preventive care refers to lifestyle choices and visits with your health care provider that can promote health and wellness. What does preventive care include? A yearly physical exam. This is also called an annual well check. Dental exams once or twice a year. Routine eye exams. Ask your health care provider how often you should have your eyes checked. Personal lifestyle choices, including: Daily care of your teeth and gums. Regular physical  activity. Eating a healthy diet. Avoiding tobacco and drug use. Limiting alcohol use. Practicing safe sex. Taking low doses of aspirin every day. Taking vitamin and mineral supplements as recommended by your health care provider. What happens during an annual well check? The services and screenings done by your health care provider during your annual well check will depend on your age, overall health, lifestyle risk factors, and family history of disease. Counseling  Your health care provider may ask you questions about your: Alcohol use. Tobacco use. Drug use. Emotional well-being. Home and relationship well-being. Sexual activity. Eating habits. History of falls. Memory and ability to understand (cognition). Work and work Astronomer. Screening  You may have the following tests or measurements: Height, weight, and BMI. Blood pressure. Lipid and cholesterol levels. These may be checked every 5 years, or more frequently if you are over 64 years old. Skin check. Lung cancer screening. You may have this screening every year starting at age 22 if you have a 30-pack-year history of smoking and currently smoke or have quit within the past 15 years. Fecal occult blood test (FOBT) of the stool. You may have this test every year starting at age 57. Flexible sigmoidoscopy or colonoscopy. You may have a sigmoidoscopy every 5 years or a colonoscopy every 10 years starting at age 52. Prostate cancer screening. Recommendations will vary depending on your family history and other risks. Hepatitis C  blood test. Hepatitis B blood test. Sexually transmitted disease (STD) testing. Diabetes screening. This is done by checking your blood sugar (glucose) after you have not eaten for a while (fasting). You may have this done every 1-3 years. Abdominal aortic aneurysm (AAA) screening. You may need this if you are a current or former smoker. Osteoporosis. You may be screened starting at age 54 if you are  at high risk. Talk with your health care provider about your test results, treatment options, and if necessary, the need for more tests. Vaccines  Your health care provider may recommend certain vaccines, such as: Influenza vaccine. This is recommended every year. Tetanus, diphtheria, and acellular pertussis (Tdap, Td) vaccine. You may need a Td booster every 10 years. Zoster vaccine. You may need this after age 68. Pneumococcal 13-valent conjugate (PCV13) vaccine. One dose is recommended after age 73. Pneumococcal polysaccharide (PPSV23) vaccine. One dose is recommended after age 69. Talk to your health care provider about which screenings and vaccines you need and how often you need them. This information is not intended to replace advice given to you by your health care provider. Make sure you discuss any questions you have with your health care provider. Document Released: 04/28/2015 Document Revised: 12/20/2015 Document Reviewed: 01/31/2015 Elsevier Interactive Patient Education  2017 [employer].  Fall Prevention in the Home Falls can cause injuries. They can happen to people of all ages. There are many things you can do to make your home safe and to help prevent falls. What can I do on the outside of my home? Regularly fix the edges of walkways and driveways and fix any cracks. Remove anything that might make you trip as you walk through a door, such as a raised step or threshold. Trim any bushes or trees on the path to your home. Use bright outdoor lighting. Clear any walking paths of anything that might make someone trip, such as rocks or tools. Regularly check to see if handrails are loose or broken. Make sure that both sides of any steps have handrails. Any raised decks and porches should have guardrails on the edges. Have any leaves, snow, or ice cleared regularly. Use sand or salt on walking paths during winter. Clean up any spills in your garage right away. This includes oil  or grease spills. What can I do in the bathroom? Use night lights. Install grab bars by the toilet and in the tub and shower. Do not use towel bars as grab bars. Use non-skid mats or decals in the tub or shower. If you need to sit down in the shower, use a plastic, non-slip stool. Keep the floor dry. Clean up any water that spills on the floor as soon as it happens. Remove soap buildup in the tub or shower regularly. Attach bath mats securely with double-sided non-slip rug tape. Do not have throw rugs and other things on the floor that can make you trip. What can I do in the bedroom? Use night lights. Make sure that you have a light by your bed that is easy to reach. Do not use any sheets or blankets that are too big for your bed. They should not hang down onto the floor. Have a firm chair that has side arms. You can use this for support while you get dressed. Do not have throw rugs and other things on the floor that can make you trip. What can I do in the kitchen? Clean up any spills right away. Avoid walking on  wet floors. Keep items that you use a lot in easy-to-reach places. If you need to reach something above you, use a strong step stool that has a grab bar. Keep electrical cords out of the way. Do not use floor polish or wax that makes floors slippery. If you must use wax, use non-skid floor wax. Do not have throw rugs and other things on the floor that can make you trip. What can I do with my stairs? Do not leave any items on the stairs. Make sure that there are handrails on both sides of the stairs and use them. Fix handrails that are broken or loose. Make sure that handrails are as long as the stairways. Check any carpeting to make sure that it is firmly attached to the stairs. Fix any carpet that is loose or worn. Avoid having throw rugs at the top or bottom of the stairs. If you do have throw rugs, attach them to the floor with carpet tape. Make sure that you have a light  switch at the top of the stairs and the bottom of the stairs. If you do not have them, ask someone to add them for you. What else can I do to help prevent falls? Wear shoes that: Do not have high heels. Have rubber bottoms. Are comfortable and fit you well. Are closed at the toe. Do not wear sandals. If you use a stepladder: Make sure that it is fully opened. Do not climb a closed stepladder. Make sure that both sides of the stepladder are locked into place. Ask someone to hold it for you, if possible. Clearly mark and make sure that you can see: Any grab bars or handrails. First and last steps. Where the edge of each step is. Use tools that help you move around (mobility aids) if they are needed. These include: Canes. Walkers. Scooters. Crutches. Turn on the lights when you go into a dark area. Replace any light bulbs as soon as they burn out. Set up your furniture so you have a clear path. Avoid moving your furniture around. If any of your floors are uneven, fix them. If there are any pets around you, be aware of where they are. Review your medicines with your doctor. Some medicines can make you feel dizzy. This can increase your chance of falling. Ask your doctor what other things that you can do to help prevent falls. This information is not intended to replace advice given to you by your health care provider. Make sure you discuss any questions you have with your health care provider. Document Released: 01/26/2009 Document Revised: 09/07/2015 Document Reviewed: 05/06/2014 Elsevier Interactive Patient Education  2017 [employer].

## 2022-08-14 NOTE — Patient Instructions (Addendum)
      Blood work was ordered.   The lab is on the first floor.    Medications changes include :  none       Return in about 6 months (around 02/15/2023) for follow up.

## 2022-08-15 ENCOUNTER — Ambulatory Visit (INDEPENDENT_AMBULATORY_CARE_PROVIDER_SITE_OTHER): Payer: Medicare Other | Admitting: Internal Medicine

## 2022-08-15 ENCOUNTER — Encounter: Payer: Self-pay | Admitting: Internal Medicine

## 2022-08-15 VITALS — BP 128/78 | HR 100 | Temp 98.7°F | Ht 72.0 in | Wt 387.0 lb

## 2022-08-15 DIAGNOSIS — I10 Essential (primary) hypertension: Secondary | ICD-10-CM | POA: Diagnosis not present

## 2022-08-15 DIAGNOSIS — Z794 Long term (current) use of insulin: Secondary | ICD-10-CM

## 2022-08-15 DIAGNOSIS — G622 Polyneuropathy due to other toxic agents: Secondary | ICD-10-CM

## 2022-08-15 DIAGNOSIS — E1142 Type 2 diabetes mellitus with diabetic polyneuropathy: Secondary | ICD-10-CM

## 2022-08-15 DIAGNOSIS — E7849 Other hyperlipidemia: Secondary | ICD-10-CM

## 2022-08-15 DIAGNOSIS — E538 Deficiency of other specified B group vitamins: Secondary | ICD-10-CM | POA: Diagnosis not present

## 2022-08-15 LAB — CBC WITH DIFFERENTIAL/PLATELET
Basophils Absolute: 0.1 10*3/uL (ref 0.0–0.1)
Basophils Relative: 0.6 % (ref 0.0–3.0)
Eosinophils Absolute: 0.4 10*3/uL (ref 0.0–0.7)
Eosinophils Relative: 4.9 % (ref 0.0–5.0)
HCT: 42.7 % (ref 39.0–52.0)
Hemoglobin: 14.5 g/dL (ref 13.0–17.0)
Lymphocytes Relative: 24.2 % (ref 12.0–46.0)
Lymphs Abs: 2.1 10*3/uL (ref 0.7–4.0)
MCHC: 34 g/dL (ref 30.0–36.0)
MCV: 85.5 fl (ref 78.0–100.0)
Monocytes Absolute: 0.7 10*3/uL (ref 0.1–1.0)
Monocytes Relative: 8 % (ref 3.0–12.0)
Neutro Abs: 5.4 10*3/uL (ref 1.4–7.7)
Neutrophils Relative %: 62.3 % (ref 43.0–77.0)
Platelets: 208 10*3/uL (ref 150.0–400.0)
RBC: 5 Mil/uL (ref 4.22–5.81)
RDW: 14.4 % (ref 11.5–15.5)
WBC: 8.7 10*3/uL (ref 4.0–10.5)

## 2022-08-15 LAB — LIPID PANEL
Cholesterol: 82 mg/dL (ref 0–200)
HDL: 33.2 mg/dL — ABNORMAL LOW (ref >39.00)
LDL Cholesterol: 23 mg/dL (ref 0–99)
NonHDL: 49.2
Total CHOL/HDL Ratio: 2
Triglycerides: 132 mg/dL (ref 0.0–149.0)
VLDL: 26.4 mg/dL (ref 0.0–40.0)

## 2022-08-15 LAB — COMPREHENSIVE METABOLIC PANEL
ALT: 21 U/L (ref 0–53)
AST: 19 U/L (ref 0–37)
Albumin: 4 g/dL (ref 3.5–5.2)
Alkaline Phosphatase: 61 U/L (ref 39–117)
BUN: 24 mg/dL — ABNORMAL HIGH (ref 6–23)
CO2: 27 mEq/L (ref 19–32)
Calcium: 9.3 mg/dL (ref 8.4–10.5)
Chloride: 105 mEq/L (ref 96–112)
Creatinine, Ser: 1.02 mg/dL (ref 0.40–1.50)
GFR: 75.51 mL/min (ref >60.00)
Glucose, Bld: 110 mg/dL — ABNORMAL HIGH (ref 70–99)
Potassium: 4.5 mEq/L (ref 3.5–5.1)
Sodium: 141 mEq/L (ref 135–145)
Total Bilirubin: 0.6 mg/dL (ref 0.2–1.2)
Total Protein: 6.8 g/dL (ref 6.0–8.3)

## 2022-08-15 LAB — MICROALBUMIN / CREATININE URINE RATIO
Creatinine,U: 153 mg/dL
Microalb Creat Ratio: 1 mg/g (ref 0.0–30.0)
Microalb, Ur: 1.5 mg/dL (ref 0.0–1.9)

## 2022-08-15 LAB — HEMOGLOBIN A1C: Hgb A1c MFr Bld: 6.2 % (ref 4.6–6.5)

## 2022-08-15 NOTE — Assessment & Plan Note (Addendum)
Chronic   Lab Results  Component Value Date   HGBA1C 6.0 02/14/2022   Sugars well controlled Testing sugars at least 1 times a day - average 140's Check A1c  Continue metformin 1000 mg twice daily, Humalog mix 75/25  45 units every morning, 40 units at night Stressed regular exercise, diabetic diet

## 2022-08-15 NOTE — Assessment & Plan Note (Addendum)
Chronic Courage weight loss which he wants Mounjaro prescribed last fall-too expensive Stressed decreased portions, diet high in protein and low in sugars and carbs Encouraged this much activity/exercise as possible

## 2022-08-15 NOTE — Assessment & Plan Note (Addendum)
Chronic ?Taking B12 daily-continue ?

## 2022-08-15 NOTE — Assessment & Plan Note (Addendum)
Chronic Blood pressure elevated here initially - improved when rechecked CMP Continue Coreg 6.25 mg twice daily, ramipril 10 mg daily

## 2022-08-15 NOTE — Assessment & Plan Note (Signed)
Chronic Check lipid panel Continue rosuvastatin 20 mg daily Encouraged healthy diet, regular exercise and weight loss

## 2022-08-15 NOTE — Assessment & Plan Note (Addendum)
Chronic Having increased pain in feet Continue Lyrica 150 mg 3 times daily

## 2022-08-26 ENCOUNTER — Other Ambulatory Visit: Payer: Self-pay

## 2022-08-26 DIAGNOSIS — E1142 Type 2 diabetes mellitus with diabetic polyneuropathy: Secondary | ICD-10-CM

## 2022-08-26 MED ORDER — HUMALOG MIX 75/25 (75-25) 100 UNIT/ML ~~LOC~~ SUSP
SUBCUTANEOUS | 5 refills | Status: DC
Start: 2022-08-26 — End: 2023-10-09

## 2022-08-27 ENCOUNTER — Telehealth: Payer: Self-pay | Admitting: Internal Medicine

## 2022-08-31 DIAGNOSIS — Z794 Long term (current) use of insulin: Secondary | ICD-10-CM | POA: Diagnosis not present

## 2022-08-31 DIAGNOSIS — E119 Type 2 diabetes mellitus without complications: Secondary | ICD-10-CM | POA: Diagnosis not present

## 2022-09-24 DIAGNOSIS — Z794 Long term (current) use of insulin: Secondary | ICD-10-CM | POA: Diagnosis not present

## 2022-09-24 DIAGNOSIS — E119 Type 2 diabetes mellitus without complications: Secondary | ICD-10-CM | POA: Diagnosis not present

## 2022-09-29 ENCOUNTER — Other Ambulatory Visit: Payer: Self-pay | Admitting: Internal Medicine

## 2022-10-02 ENCOUNTER — Telehealth: Payer: Self-pay | Admitting: Internal Medicine

## 2022-10-02 NOTE — Telephone Encounter (Signed)
Prescription Request  10/02/2022  LOV: 08/15/2022  What is the name of the medication or equipment? carvedilol (COREG) 6.25 MG tablet  metFORMIN (GLUCOPHAGE) 1000 MG tablet  rosuvastatin (CRESTOR) 20 MG tablet  Have you contacted your pharmacy to request a refill? Yes   Which pharmacy would you like this sent to?  [facility] - [place], [place] - [address] [place] [address] Phone: [phone] Fax: [phone]    Patient notified that their request is being sent to the clinical staff for review and that they should receive a response within 2 business days.   Please advise at Mobile [phone] (mobile)

## 2022-10-03 ENCOUNTER — Other Ambulatory Visit: Payer: Self-pay | Admitting: Radiology

## 2022-10-03 MED ORDER — CARVEDILOL 6.25 MG PO TABS: ORAL_TABLET | ORAL | 1 refills | Status: DC

## 2022-10-03 MED ORDER — ROSUVASTATIN CALCIUM 20 MG PO TABS: ORAL_TABLET | ORAL | 1 refills | Status: DC

## 2022-10-03 MED ORDER — RAMIPRIL 10 MG PO CAPS: ORAL_CAPSULE | ORAL | 1 refills | Status: DC

## 2022-10-03 MED ORDER — METFORMIN HCL 1000 MG PO TABS: ORAL_TABLET | ORAL | 1 refills | Status: DC

## 2022-10-24 DIAGNOSIS — E119 Type 2 diabetes mellitus without complications: Secondary | ICD-10-CM | POA: Diagnosis not present

## 2022-10-24 DIAGNOSIS — Z794 Long term (current) use of insulin: Secondary | ICD-10-CM | POA: Diagnosis not present

## 2022-11-04 LAB — HEMOGLOBIN A1C: A1c: 5.4

## 2022-11-23 DIAGNOSIS — E119 Type 2 diabetes mellitus without complications: Secondary | ICD-10-CM | POA: Diagnosis not present

## 2022-11-23 DIAGNOSIS — Z794 Long term (current) use of insulin: Secondary | ICD-10-CM | POA: Diagnosis not present

## 2022-12-02 DIAGNOSIS — I739 Peripheral vascular disease, unspecified: Secondary | ICD-10-CM | POA: Diagnosis not present

## 2022-12-02 DIAGNOSIS — B351 Tinea unguium: Secondary | ICD-10-CM | POA: Diagnosis not present

## 2022-12-02 DIAGNOSIS — E1351 Other specified diabetes mellitus with diabetic peripheral angiopathy without gangrene: Secondary | ICD-10-CM | POA: Diagnosis not present

## 2022-12-02 DIAGNOSIS — G629 Polyneuropathy, unspecified: Secondary | ICD-10-CM | POA: Diagnosis not present

## 2022-12-02 DIAGNOSIS — M79674 Pain in right toe(s): Secondary | ICD-10-CM | POA: Diagnosis not present

## 2022-12-02 DIAGNOSIS — E1151 Type 2 diabetes mellitus with diabetic peripheral angiopathy without gangrene: Secondary | ICD-10-CM | POA: Diagnosis not present

## 2022-12-23 ENCOUNTER — Other Ambulatory Visit: Payer: Self-pay | Admitting: Internal Medicine

## 2023-01-11 DIAGNOSIS — Z794 Long term (current) use of insulin: Secondary | ICD-10-CM | POA: Diagnosis not present

## 2023-01-11 DIAGNOSIS — E119 Type 2 diabetes mellitus without complications: Secondary | ICD-10-CM | POA: Diagnosis not present

## 2023-02-16 ENCOUNTER — Encounter: Payer: Self-pay | Admitting: Internal Medicine

## 2023-02-16 NOTE — Patient Instructions (Addendum)
     Flu immunization administered today.     Blood work was ordered.   The lab is on the first floor.    Medications changes include :   none     Return in about 3 months (around 05/20/2023) for Physical Exam.

## 2023-02-16 NOTE — Progress Notes (Unsigned)
Subjective:    Patient ID: Brandon Schaefer, male    DOB: 1955/05/20, 67 y.o.   MRN: [id number]     HPI Neldon is here for follow up of his chronic medical problems.   Feet are numb to touch, has stinging sensation from neuropathy.   Had rash on b/l elbows, chest - starting to go away.  No new products.     Medications and allergies reviewed with patient and updated if appropriate.  Current Outpatient Medications on File Prior to Visit  Medication Sig Dispense Refill   aspirin EC 81 MG tablet Take 81 mg by mouth daily.     carvedilol (COREG) 6.25 MG tablet TAKE 1 TABLET BY MOUTH TWICE  DAILY WITH A MEAL 200 tablet 2   Continuous Blood Gluc Receiver (FREESTYLE LIBRE 2 READER) DEVI Use as directed to check sugars   E11.65 1 each 0   Continuous Blood Gluc Sensor (FREESTYLE LIBRE 14 DAY SENSOR) MISC Use as directed to check sugars  E11.65 28 each 5   insulin lispro protamine-lispro (HUMALOG MIX 75/25) (75-25) 100 UNIT/ML SUSP injection INJECT 45 UNITS UNDER THE SKIN EVERY MORNING AND 40 UNITS AT NIGHT 80 mL 5   Insulin Syringe-Needle U-100 (INSULIN SYRINGE 1CC/31GX5/16") 31G X 5/16" 1 ML MISC USE AS DIRECTED TWICE DAILY FOR INSULIN 200 each 2   metFORMIN (GLUCOPHAGE) 1000 MG tablet TAKE 1 TABLET BY MOUTH TWICE  DAILY WITH A MEAL 200 tablet 2   pregabalin (LYRICA) 150 MG capsule TAKE 1 CAPSULE BY MOUTH 3 TIMES  DAILY (MORNING, NOON, AND   BEDTIME) 270 capsule 0   ramipril (ALTACE) 10 MG capsule TAKE 1 CAPSULE BY MOUTH DAILY  BEFORE BREAKFAST 100 capsule 2   rosuvastatin (CRESTOR) 20 MG tablet TAKE 1 TABLET BY MOUTH DAILY  BEFORE BREAKFAST 100 tablet 2   vitamin B-12 (CYANOCOBALAMIN) 1000 MCG tablet Take 1 tablet (1,000 mcg total) by mouth every other day.     No current facility-administered medications on file prior to visit.     Review of Systems  Constitutional:  Negative for fever.  Respiratory:  Negative for cough, shortness of breath and wheezing.   Cardiovascular:   Positive for leg swelling (minimal). Negative for chest pain and palpitations.  Neurological:  Negative for light-headedness and headaches.       Objective:   Vitals:   02/17/23 0759  BP: 134/70  Pulse: (!) 101  Temp: 98.2 F (36.8 C)  SpO2: 92%   BP Readings from Last 3 Encounters:  02/17/23 134/70  08/15/22 128/78  02/14/22 136/70   Wt Readings from Last 3 Encounters:  02/17/23 (!) 394 lb (178.7 kg)  08/15/22 (!) 387 lb (175.5 kg)  08/14/22 (!) 370 lb (167.8 kg)   Body mass index is 53.44 kg/m.    Physical Exam Constitutional:      General: He is not in acute distress.    Appearance: Normal appearance. He is not ill-appearing.  HENT:     Head: Normocephalic and atraumatic.  Eyes:     Conjunctiva/sclera: Conjunctivae normal.  Cardiovascular:     Rate and Rhythm: Normal rate and regular rhythm.     Heart sounds: Murmur (2/6 systolic) heard.  Pulmonary:     Effort: Pulmonary effort is normal. No respiratory distress.     Breath sounds: Normal breath sounds. No wheezing or rales.  Musculoskeletal:     Right lower leg: No edema.     Left lower leg: No edema.  Skin:  General: Skin is warm and dry.     Findings: Erythema (chronic b/l lower extremities - stable) and rash (resolving rash b/l upper arms) present.  Neurological:     Mental Status: He is alert. Mental status is at baseline.  Psychiatric:        Mood and Affect: Mood normal.        Lab Results  Component Value Date   WBC 8.7 08/15/2022   HGB 14.5 08/15/2022   HCT 42.7 08/15/2022   PLT 208.0 08/15/2022   GLUCOSE 110 (H) 08/15/2022   CHOL 82 08/15/2022   TRIG 132.0 08/15/2022   HDL 33.20 (L) 08/15/2022   LDLCALC 23 08/15/2022   ALT 21 08/15/2022   AST 19 08/15/2022   NA 141 08/15/2022   K 4.5 08/15/2022   CL 105 08/15/2022   CREATININE 1.02 08/15/2022   BUN 24 (H) 08/15/2022   CO2 27 08/15/2022   TSH 3.55 08/14/2021   PSA 0.23 08/14/2021   HGBA1C 6.2 08/15/2022   MICROALBUR 1.5  08/15/2022     Assessment & Plan:    See Problem List for Assessment and Plan of chronic medical problems.

## 2023-02-17 ENCOUNTER — Ambulatory Visit (INDEPENDENT_AMBULATORY_CARE_PROVIDER_SITE_OTHER): Payer: Medicare Other | Admitting: Internal Medicine

## 2023-02-17 VITALS — BP 134/70 | HR 101 | Temp 98.2°F | Ht 72.0 in | Wt 394.0 lb

## 2023-02-17 DIAGNOSIS — Z85038 Personal history of other malignant neoplasm of large intestine: Secondary | ICD-10-CM

## 2023-02-17 DIAGNOSIS — E1142 Type 2 diabetes mellitus with diabetic polyneuropathy: Secondary | ICD-10-CM

## 2023-02-17 DIAGNOSIS — Z23 Encounter for immunization: Secondary | ICD-10-CM | POA: Diagnosis not present

## 2023-02-17 DIAGNOSIS — E538 Deficiency of other specified B group vitamins: Secondary | ICD-10-CM

## 2023-02-17 DIAGNOSIS — I2510 Atherosclerotic heart disease of native coronary artery without angina pectoris: Secondary | ICD-10-CM | POA: Insufficient documentation

## 2023-02-17 DIAGNOSIS — E7849 Other hyperlipidemia: Secondary | ICD-10-CM

## 2023-02-17 DIAGNOSIS — Z6841 Body Mass Index (BMI) 40.0 and over, adult: Secondary | ICD-10-CM

## 2023-02-17 DIAGNOSIS — G622 Polyneuropathy due to other toxic agents: Secondary | ICD-10-CM | POA: Diagnosis not present

## 2023-02-17 DIAGNOSIS — I10 Essential (primary) hypertension: Secondary | ICD-10-CM | POA: Diagnosis not present

## 2023-02-17 DIAGNOSIS — Z794 Long term (current) use of insulin: Secondary | ICD-10-CM

## 2023-02-17 LAB — COMPREHENSIVE METABOLIC PANEL
ALT: 17 U/L (ref 0–53)
AST: 16 U/L (ref 0–37)
Albumin: 4.2 g/dL (ref 3.5–5.2)
Alkaline Phosphatase: 62 U/L (ref 39–117)
BUN: 20 mg/dL (ref 6–23)
CO2: 30 meq/L (ref 19–32)
Calcium: 9.5 mg/dL (ref 8.4–10.5)
Chloride: 101 meq/L (ref 96–112)
Creatinine, Ser: 1.02 mg/dL (ref 0.40–1.50)
GFR: 75.24 mL/min (ref >60.00)
Glucose, Bld: 126 mg/dL — ABNORMAL HIGH (ref 70–99)
Potassium: 4.5 meq/L (ref 3.5–5.1)
Sodium: 141 meq/L (ref 135–145)
Total Bilirubin: 0.6 mg/dL (ref 0.2–1.2)
Total Protein: 7 g/dL (ref 6.0–8.3)

## 2023-02-17 LAB — CBC WITH DIFFERENTIAL/PLATELET
Basophils Absolute: 0 10*3/uL (ref 0.0–0.1)
Basophils Relative: 0.5 % (ref 0.0–3.0)
Eosinophils Absolute: 0.4 10*3/uL (ref 0.0–0.7)
Eosinophils Relative: 4 % (ref 0.0–5.0)
HCT: 44.7 % (ref 39.0–52.0)
Hemoglobin: 14.7 g/dL (ref 13.0–17.0)
Lymphocytes Relative: 21.6 % (ref 12.0–46.0)
Lymphs Abs: 2 10*3/uL (ref 0.7–4.0)
MCHC: 32.9 g/dL (ref 30.0–36.0)
MCV: 89.2 fL (ref 78.0–100.0)
Monocytes Absolute: 0.7 10*3/uL (ref 0.1–1.0)
Monocytes Relative: 7.9 % (ref 3.0–12.0)
Neutro Abs: 6.1 10*3/uL (ref 1.4–7.7)
Neutrophils Relative %: 66 % (ref 43.0–77.0)
Platelets: 262 10*3/uL (ref 150.0–400.0)
RBC: 5.02 Mil/uL (ref 4.22–5.81)
RDW: 14 % (ref 11.5–15.5)
WBC: 9.3 10*3/uL (ref 4.0–10.5)

## 2023-02-17 LAB — LIPID PANEL
Cholesterol: 82 mg/dL (ref 0–200)
HDL: 34.3 mg/dL — ABNORMAL LOW (ref >39.00)
LDL Cholesterol: 18 mg/dL (ref 0–99)
NonHDL: 47.92
Total CHOL/HDL Ratio: 2
Triglycerides: 152 mg/dL — ABNORMAL HIGH (ref 0.0–149.0)
VLDL: 30.4 mg/dL (ref 0.0–40.0)

## 2023-02-17 LAB — MICROALBUMIN / CREATININE URINE RATIO
Creatinine,U: 117 mg/dL
Microalb Creat Ratio: 1.5 mg/g (ref 0.0–30.0)
Microalb, Ur: 1.8 mg/dL (ref 0.0–1.9)

## 2023-02-17 LAB — HEMOGLOBIN A1C: Hgb A1c MFr Bld: 6 % (ref 4.6–6.5)

## 2023-02-17 NOTE — Assessment & Plan Note (Signed)
Chronic Having increased pain in feet Continue Lyrica 150 mg 3 times daily

## 2023-02-17 NOTE — Assessment & Plan Note (Signed)
Chronic  Lab Results  Component Value Date   HGBA1C 6.2 08/15/2022   Sugars well controlled Testing sugars at least 1 times a day Check A1c  Continue metformin 1000 mg twice daily, Humalog mix 75/25  45 units every morning, 40 units at night Stressed regular exercise, diabetic diet

## 2023-02-17 NOTE — Assessment & Plan Note (Signed)
Chronic Blood pressure elevated here initially - improved when rechecked CMP, CBC Continue Coreg 6.25 mg twice daily, ramipril 10 mg daily

## 2023-02-17 NOTE — Assessment & Plan Note (Addendum)
Chronic Evidence of CAD on previous imaging No chest pain Continue aspirin 81 mg daily, Coreg 6.25 mg twice daily, rosuvastatin 20 mg daily Would benefit greatly from weight loss and further risk reduction

## 2023-02-17 NOTE — Assessment & Plan Note (Signed)
H/o colon cancer Diagnosed 2013.  Had surgery and chemotherapy (completed June 2014) no evidence of recurrence Overdue for colonoscopy - he deferred today - he will think about it

## 2023-02-17 NOTE — Assessment & Plan Note (Signed)
Chronic Morbidly obese Stressed weight loss

## 2023-02-17 NOTE — Assessment & Plan Note (Signed)
Chronic Encouraged weight loss  Mounjaro prescribed last fall-too expensive Stressed decreased portions, diet high in protein and low in sugars and carbs Encouraged this much activity/exercise as possible

## 2023-02-17 NOTE — Assessment & Plan Note (Signed)
Chronic ?Taking B12 daily-continue ?

## 2023-02-17 NOTE — Assessment & Plan Note (Signed)
Chronic Check lipid panel Continue rosuvastatin 20 mg daily Encouraged healthy diet, regular exercise and weight loss

## 2023-03-10 ENCOUNTER — Other Ambulatory Visit: Payer: Self-pay | Admitting: Internal Medicine

## 2023-03-21 DIAGNOSIS — E1151 Type 2 diabetes mellitus with diabetic peripheral angiopathy without gangrene: Secondary | ICD-10-CM | POA: Diagnosis not present

## 2023-03-21 DIAGNOSIS — E1351 Other specified diabetes mellitus with diabetic peripheral angiopathy without gangrene: Secondary | ICD-10-CM | POA: Diagnosis not present

## 2023-03-21 DIAGNOSIS — M79674 Pain in right toe(s): Secondary | ICD-10-CM | POA: Diagnosis not present

## 2023-03-21 DIAGNOSIS — B351 Tinea unguium: Secondary | ICD-10-CM | POA: Diagnosis not present

## 2023-03-21 DIAGNOSIS — M79675 Pain in left toe(s): Secondary | ICD-10-CM | POA: Diagnosis not present

## 2023-03-21 DIAGNOSIS — G629 Polyneuropathy, unspecified: Secondary | ICD-10-CM | POA: Diagnosis not present

## 2023-03-21 DIAGNOSIS — I739 Peripheral vascular disease, unspecified: Secondary | ICD-10-CM | POA: Diagnosis not present

## 2023-03-31 ENCOUNTER — Telehealth: Payer: Self-pay | Admitting: Internal Medicine

## 2023-03-31 NOTE — Telephone Encounter (Signed)
[facility] called and needs a prescription for the Free style Libre 2 and the face to face notes faxed to:  [phone]

## 2023-04-01 DIAGNOSIS — N200 Calculus of kidney: Secondary | ICD-10-CM | POA: Diagnosis not present

## 2023-04-02 ENCOUNTER — Other Ambulatory Visit: Payer: Self-pay

## 2023-04-02 DIAGNOSIS — E1142 Type 2 diabetes mellitus with diabetic polyneuropathy: Secondary | ICD-10-CM

## 2023-04-02 MED ORDER — FREESTYLE LIBRE 14 DAY SENSOR MISC: 5 refills | Status: AC

## 2023-04-02 MED ORDER — FREESTYLE LIBRE 2 READER DEVI: 0 refills | Status: DC

## 2023-04-02 NOTE — Telephone Encounter (Signed)
Order and office notes printed out.   Awaiting for Dr. Lawerance Bach to sign

## 2023-04-03 NOTE — Telephone Encounter (Signed)
Form faxed back today and fax conformation received.

## 2023-04-07 DIAGNOSIS — E1142 Type 2 diabetes mellitus with diabetic polyneuropathy: Secondary | ICD-10-CM | POA: Diagnosis not present

## 2023-04-07 DIAGNOSIS — Z794 Long term (current) use of insulin: Secondary | ICD-10-CM | POA: Diagnosis not present

## 2023-05-18 ENCOUNTER — Encounter: Payer: Self-pay | Admitting: Internal Medicine

## 2023-05-18 NOTE — Patient Instructions (Addendum)
 Blood work was ordered.       Medications changes include :   None     Return in about 3 months (around 08/17/2023) for follow up.    Health Maintenance, Male Adopting a healthy lifestyle and getting preventive care are important in promoting health and wellness. Ask your health care provider about: The right schedule for you to have regular tests and exams. Things you can do on your own to prevent diseases and keep yourself healthy. What should I know about diet, weight, and exercise? Eat a healthy diet  Eat a diet that includes plenty of vegetables, fruits, low-fat dairy products, and lean protein. Do not eat a lot of foods that are high in solid fats, added sugars, or sodium. Maintain a healthy weight Body mass index (BMI) is a measurement that can be used to identify possible weight problems. It estimates body fat based on height and weight. Your health care provider can help determine your BMI and help you achieve or maintain a healthy weight. Get regular exercise Get regular exercise. This is one of the most important things you can do for your health. Most adults should: Exercise for at least 150 minutes each week. The exercise should increase your heart rate and make you sweat (moderate-intensity exercise). Do strengthening exercises at least twice a week. This is in addition to the moderate-intensity exercise. Spend less time sitting. Even light physical activity can be beneficial. Watch cholesterol and blood lipids Have your blood tested for lipids and cholesterol at 70 years of age, then have this test every 5 years. You may need to have your cholesterol levels checked more often if: Your lipid or cholesterol levels are high. You are older than 70 years of age. You are at high risk for heart disease. What should I know about cancer screening? Many types of cancers can be detected early and may often be prevented. Depending on your health history and family  history, you may need to have cancer screening at various ages. This may include screening for: Colorectal cancer. Prostate cancer. Skin cancer. Lung cancer. What should I know about heart disease, diabetes, and high blood pressure? Blood pressure and heart disease High blood pressure causes heart disease and increases the risk of stroke. This is more likely to develop in people who have high blood pressure readings or are overweight. Talk with your health care provider about your target blood pressure readings. Have your blood pressure checked: Every 3-5 years if you are 70-70 years of age. Every year if you are 18 years old or older. If you are between the ages of 49 and 62 and are a current or former smoker, ask your health care provider if you should have a one-time screening for abdominal aortic aneurysm (AAA). Diabetes Have regular diabetes screenings. This checks your fasting blood sugar level. Have the screening done: Once every three years after age 57 if you are at a normal weight and have a low risk for diabetes. More often and at a younger age if you are overweight or have a high risk for diabetes. What should I know about preventing infection? Hepatitis B If you have a higher risk for hepatitis B, you should be screened for this virus. Talk with your health care provider to find out if you are at risk for hepatitis B infection. Hepatitis C Blood testing is recommended for: Everyone born from [birth year] through 1967. Anyone with known risk factors for hepatitis C. Sexually  transmitted infections (STIs) You should be screened each year for STIs, including gonorrhea and chlamydia, if: You are sexually active and are younger than 70 years of age. You are older than 70 years of age and your health care provider tells you that you are at risk for this type of infection. Your sexual activity has changed since you were last screened, and you are at increased risk for chlamydia or  gonorrhea. Ask your health care provider if you are at risk. Ask your health care provider about whether you are at high risk for HIV. Your health care provider may recommend a prescription medicine to help prevent HIV infection. If you choose to take medicine to prevent HIV, you should first get tested for HIV. You should then be tested every 3 months for as long as you are taking the medicine. Follow these instructions at home: Alcohol use Do not drink alcohol if your health care provider tells you not to drink. If you drink alcohol: Limit how much you have to 0-2 drinks a day. Know how much alcohol is in your drink. In the [place] one drink equals one 12 oz bottle of beer (355 mL), one 5 oz glass of wine (148 mL), or one 1 oz glass of hard liquor (44 mL). Lifestyle Do not use any products that contain nicotine or tobacco. These products include cigarettes, chewing tobacco, and vaping devices, such as e-cigarettes. If you need help quitting, ask your health care provider. Do not use street drugs. Do not share needles. Ask your health care provider for help if you need support or information about quitting drugs. General instructions Schedule regular health, dental, and eye exams. Stay current with your vaccines. Tell your health care provider if: You often feel depressed. You have ever been abused or do not feel safe at home. Summary Adopting a healthy lifestyle and getting preventive care are important in promoting health and wellness. Follow your health care provider's instructions about healthy diet, exercising, and getting tested or screened for diseases. Follow your health care provider's instructions on monitoring your cholesterol and blood pressure. This information is not intended to replace advice given to you by your health care provider. Make sure you discuss any questions you have with your health care provider. Document Revised: 08/21/2020 Document Reviewed: 08/21/2020 Elsevier  Patient Education  2024 [employer].

## 2023-05-18 NOTE — Progress Notes (Signed)
 Subjective:    Patient ID: Brandon Schaefer, male    DOB: 31-Mar-1956, 68 y.o.   MRN: [id number]     HPI Brandon Schaefer is here for a physical exam and his chronic medical problems.   No changes in health.  No concerns.     Medications and allergies reviewed with patient and updated if appropriate.  Current Outpatient Medications on File Prior to Visit  Medication Sig Dispense Refill   aspirin  EC 81 MG tablet Take 81 mg by mouth daily.     carvedilol  (COREG ) 6.25 MG tablet TAKE 1 TABLET BY MOUTH TWICE  DAILY WITH A MEAL 200 tablet 2   Continuous Glucose Receiver (FREESTYLE LIBRE 2 READER) DEVI Use as directed to check sugars   E11.65 1 each 0   Continuous Glucose Sensor (FREESTYLE LIBRE 14 DAY SENSOR) MISC Use as directed to check sugars  E11.65 28 each 5   insulin  lispro protamine-lispro (HUMALOG  MIX 75/25) (75-25) 100 UNIT/ML SUSP injection INJECT 45 UNITS UNDER THE SKIN EVERY MORNING AND 40 UNITS AT NIGHT 80 mL 5   Insulin  Syringe-Needle U-100 (INSULIN  SYRINGE 1CC/31GX5/16) 31G X 5/16 1 ML MISC USE AS DIRECTED TWICE DAILY FOR INSULIN  200 each 2   metFORMIN  (GLUCOPHAGE ) 1000 MG tablet TAKE 1 TABLET BY MOUTH TWICE  DAILY WITH A MEAL 200 tablet 2   pregabalin  (LYRICA ) 150 MG capsule TAKE 1 CAPSULE BY MOUTH 3 TIMES  DAILY (MORNING, NOON, AND  BEDTIME) 270 capsule 0   ramipril  (ALTACE ) 10 MG capsule TAKE 1 CAPSULE BY MOUTH DAILY  BEFORE BREAKFAST 100 capsule 2   rosuvastatin  (CRESTOR ) 20 MG tablet TAKE 1 TABLET BY MOUTH DAILY  BEFORE BREAKFAST 100 tablet 2   vitamin B-12 (CYANOCOBALAMIN ) 1000 MCG tablet Take 1 tablet (1,000 mcg total) by mouth every other day.     No current facility-administered medications on file prior to visit.    Review of Systems  Constitutional:  Negative for fever.  Eyes:  Negative for visual disturbance.  Respiratory:  Negative for cough, shortness of breath and wheezing.   Cardiovascular:  Negative for chest pain, palpitations and leg swelling.   Gastrointestinal:  Negative for abdominal pain, blood in stool, constipation and diarrhea.       No gerd  Genitourinary:  Negative for difficulty urinating, dysuria and hematuria.  Musculoskeletal:  Positive for arthralgias (mild, chronic). Negative for back pain.  Skin:  Positive for rash (chronic - intermittent - itches).  Neurological:  Positive for headaches (allergy related). Negative for light-headedness.  Psychiatric/Behavioral:  Negative for dysphoric mood. The patient is not nervous/anxious.        Objective:   Vitals:   05/20/23 0803  BP: 136/78  Pulse: (!) 101  Temp: 98.1 F (36.7 C)  SpO2: 97%   Filed Weights   05/20/23 0803  Weight: (!) 398 lb (180.5 kg)   Body mass index is 53.98 kg/m.  BP Readings from Last 3 Encounters:  05/20/23 136/78  02/17/23 134/70  08/15/22 128/78    Wt Readings from Last 3 Encounters:  05/20/23 (!) 398 lb (180.5 kg)  02/17/23 (!) 394 lb (178.7 kg)  08/15/22 (!) 387 lb (175.5 kg)      Physical Exam Constitutional: He appears well-developed and well-nourished. No distress.  HENT:  Head: Normocephalic and atraumatic.  Right Ear: External ear normal.  Left Ear: External ear normal.  Normal ear canals and TM b/l  Mouth/Throat: Oropharynx is clear and moist. Eyes: Conjunctivae and EOM are normal.  Neck:  Neck supple. No tracheal deviation present. No thyromegaly present.  No carotid bruit  Cardiovascular: Normal rate, regular rhythm, normal heart sounds and intact distal pulses.   No murmur heard.  No lower extremity edema. Pulmonary/Chest: Effort normal and breath sounds normal. No respiratory distress. He has no wheezes. He has no rales.  Abdominal: Soft. He exhibits no distension. There is no tenderness.  Genitourinary: deferred  Lymphadenopathy:   He has no cervical adenopathy.  Skin: Skin is warm and dry. He is not diaphoretic. Chronic skin changes b/l LE - stable.  Mild rash right upper arm - patch of erythema -  chronic, intermittent Psychiatric: He has a normal mood and affect. His behavior is normal.         Assessment & Plan:   Physical exam: Screening blood work  ordered Exercise   minimal Weight  obese Substance abuse   none   Reviewed recommended immunizations.   Health Maintenance  Topic Date Due   COVID-19 Vaccine (3 - 2024-25 season) 12/15/2022   OPHTHALMOLOGY EXAM  04/28/2023   Zoster Vaccines- Shingrix (1 of 2) 05/20/2023 (Originally 02/02/2004)   DTaP/Tdap/Td (2 - Td or Tdap) 02/17/2024 (Originally 03/22/2018)   Colonoscopy  02/17/2024 (Originally 12/18/2012)   Medicare Annual Wellness (AWV)  08/14/2023   HEMOGLOBIN A1C  08/17/2023   FOOT EXAM  12/02/2023   Diabetic kidney evaluation - eGFR measurement  02/17/2024   Diabetic kidney evaluation - Urine ACR  02/17/2024   Pneumonia Vaccine 41+ Years old  Completed   INFLUENZA VACCINE  Completed   Hepatitis C Screening  Completed   HPV VACCINES  Aged Out     See Problem List for Assessment and Plan of chronic medical problems.

## 2023-05-20 ENCOUNTER — Encounter: Payer: Self-pay | Admitting: Internal Medicine

## 2023-05-20 ENCOUNTER — Ambulatory Visit: Payer: Medicare Other | Admitting: Internal Medicine

## 2023-05-20 VITALS — BP 136/78 | HR 101 | Temp 98.1°F | Ht 72.0 in | Wt 398.0 lb

## 2023-05-20 DIAGNOSIS — Z0000 Encounter for general adult medical examination without abnormal findings: Secondary | ICD-10-CM | POA: Diagnosis not present

## 2023-05-20 DIAGNOSIS — G622 Polyneuropathy due to other toxic agents: Secondary | ICD-10-CM

## 2023-05-20 DIAGNOSIS — E1142 Type 2 diabetes mellitus with diabetic polyneuropathy: Secondary | ICD-10-CM

## 2023-05-20 DIAGNOSIS — E538 Deficiency of other specified B group vitamins: Secondary | ICD-10-CM

## 2023-05-20 DIAGNOSIS — E7849 Other hyperlipidemia: Secondary | ICD-10-CM

## 2023-05-20 DIAGNOSIS — I10 Essential (primary) hypertension: Secondary | ICD-10-CM | POA: Diagnosis not present

## 2023-05-20 DIAGNOSIS — Z794 Long term (current) use of insulin: Secondary | ICD-10-CM

## 2023-05-20 DIAGNOSIS — Z6841 Body Mass Index (BMI) 40.0 and over, adult: Secondary | ICD-10-CM

## 2023-05-20 DIAGNOSIS — I2510 Atherosclerotic heart disease of native coronary artery without angina pectoris: Secondary | ICD-10-CM | POA: Diagnosis not present

## 2023-05-20 LAB — COMPREHENSIVE METABOLIC PANEL
ALT: 18 U/L (ref 0–53)
AST: 17 U/L (ref 0–37)
Albumin: 4.3 g/dL (ref 3.5–5.2)
Alkaline Phosphatase: 62 U/L (ref 39–117)
BUN: 18 mg/dL (ref 6–23)
CO2: 23 meq/L (ref 19–32)
Calcium: 9.1 mg/dL (ref 8.4–10.5)
Chloride: 105 meq/L (ref 96–112)
Creatinine, Ser: 0.94 mg/dL (ref 0.40–1.50)
GFR: 82.84 mL/min (ref >60.00)
Glucose, Bld: 152 mg/dL — ABNORMAL HIGH (ref 70–99)
Potassium: 4.3 meq/L (ref 3.5–5.1)
Sodium: 142 meq/L (ref 135–145)
Total Bilirubin: 0.6 mg/dL (ref 0.2–1.2)
Total Protein: 7 g/dL (ref 6.0–8.3)

## 2023-05-20 LAB — LIPID PANEL
Cholesterol: 88 mg/dL (ref 0–200)
HDL: 36.6 mg/dL — ABNORMAL LOW (ref >39.00)
LDL Cholesterol: 23 mg/dL (ref 0–99)
NonHDL: 51.17
Total CHOL/HDL Ratio: 2
Triglycerides: 141 mg/dL (ref 0.0–149.0)
VLDL: 28.2 mg/dL (ref 0.0–40.0)

## 2023-05-20 LAB — VITAMIN B12: Vitamin B-12: 193 pg/mL — ABNORMAL LOW (ref 211–911)

## 2023-05-20 LAB — HEMOGLOBIN A1C: Hgb A1c MFr Bld: 6.2 % (ref 4.6–6.5)

## 2023-05-20 NOTE — Assessment & Plan Note (Signed)
 Chronic Lab Results  Component Value Date   HGBA1C 6.0 02/17/2023   Sugars well controlled Testing sugars at least 1 times a day Check A1c  Continue metformin  1000 mg twice daily, Humalog  mix 75/25  45 units every morning, 40 units at night Stressed regular exercise, diabetic diet

## 2023-05-20 NOTE — Assessment & Plan Note (Signed)
Chronic Continue taking B12 daily Check B12 level

## 2023-05-20 NOTE — Assessment & Plan Note (Signed)
 Chronic Evidence of CAD on previous imaging No chest pain Continue aspirin 81 mg daily, Coreg 6.25 mg twice daily, rosuvastatin 20 mg daily Would benefit greatly from weight loss and further risk reduction

## 2023-05-20 NOTE — Assessment & Plan Note (Signed)
Chronic Check lipid panel Continue rosuvastatin 20 mg daily Encouraged healthy diet, regular exercise and weight loss

## 2023-05-20 NOTE — Assessment & Plan Note (Addendum)
Chronic Morbidly obese Encouraged weight loss

## 2023-05-20 NOTE — Assessment & Plan Note (Addendum)
Chronic Having increased pain in feet - getting worse Continue Lyrica 150 mg 3 times daily

## 2023-05-20 NOTE — Assessment & Plan Note (Signed)
Chronic Blood pressure controlled  CMP, CBC Continue Coreg 6.25 mg twice daily, ramipril 10 mg daily

## 2023-05-26 ENCOUNTER — Other Ambulatory Visit: Payer: Self-pay | Admitting: Internal Medicine

## 2023-05-29 ENCOUNTER — Telehealth: Payer: Self-pay

## 2023-05-29 NOTE — Telephone Encounter (Signed)
15 pages faxed to Emi [facility] today for patient to [phone] for diabetic supplies.

## 2023-06-06 ENCOUNTER — Telehealth: Payer: Self-pay | Admitting: Internal Medicine

## 2023-06-06 MED ORDER — PREGABALIN 150 MG PO CAPS: ORAL_CAPSULE | ORAL | 0 refills | Status: DC

## 2023-06-06 NOTE — Telephone Encounter (Signed)
 Copied from CRM [id number]. Topic: Clinical - Prescription Issue >> Jun 06, 2023 10:09 AM Irine Seal wrote: Reason for CRM: [facility] pharmacist Amil Amen) called regarding the patient's pregabalin (Lyrica) 150 mg capsule   The pharmacist reported that [employer] is unable to locate the package, and it has not been updated since 02/18. The patient is out of medication and is requesting the prescription be resent.   Since pregabalin is a controlled substance, a new prescription will be required. The pharmacist will attempt to send a 10-day bridge supply to ensure the patient receives medication, as the patient is unable to go to the local pharmacy due to a disability and ran out of medication yesterday.

## 2023-06-06 NOTE — Telephone Encounter (Signed)
 New rx sent

## 2023-06-13 DIAGNOSIS — E1351 Other specified diabetes mellitus with diabetic peripheral angiopathy without gangrene: Secondary | ICD-10-CM | POA: Diagnosis not present

## 2023-06-13 DIAGNOSIS — G629 Polyneuropathy, unspecified: Secondary | ICD-10-CM | POA: Diagnosis not present

## 2023-06-13 DIAGNOSIS — M79674 Pain in right toe(s): Secondary | ICD-10-CM | POA: Diagnosis not present

## 2023-07-08 DIAGNOSIS — E1165 Type 2 diabetes mellitus with hyperglycemia: Secondary | ICD-10-CM | POA: Diagnosis not present

## 2023-07-08 DIAGNOSIS — E119 Type 2 diabetes mellitus without complications: Secondary | ICD-10-CM | POA: Diagnosis not present

## 2023-07-09 DIAGNOSIS — I70203 Unspecified atherosclerosis of native arteries of extremities, bilateral legs: Secondary | ICD-10-CM | POA: Diagnosis not present

## 2023-07-09 DIAGNOSIS — M79674 Pain in right toe(s): Secondary | ICD-10-CM | POA: Diagnosis not present

## 2023-07-09 DIAGNOSIS — E1351 Other specified diabetes mellitus with diabetic peripheral angiopathy without gangrene: Secondary | ICD-10-CM | POA: Diagnosis not present

## 2023-07-09 DIAGNOSIS — G629 Polyneuropathy, unspecified: Secondary | ICD-10-CM | POA: Diagnosis not present

## 2023-07-11 ENCOUNTER — Other Ambulatory Visit: Payer: Self-pay | Admitting: Internal Medicine

## 2023-07-11 NOTE — Telephone Encounter (Signed)
 Copied from CRM [id number]. Topic: Clinical - Prescription Issue >> Jul 11, 2023  2:08 PM Alcus Dad wrote: Reason for CRM: pharmacy deleted prescription from system and insurance. Need to send prescription to pharmacy to put it back in system. pregabalin (LYRICA) 150 MG capsule

## 2023-07-13 MED ORDER — PREGABALIN 150 MG PO CAPS: ORAL_CAPSULE | ORAL | 0 refills | Status: DC

## 2023-07-21 DIAGNOSIS — G629 Polyneuropathy, unspecified: Secondary | ICD-10-CM | POA: Diagnosis not present

## 2023-07-21 DIAGNOSIS — M79674 Pain in right toe(s): Secondary | ICD-10-CM | POA: Diagnosis not present

## 2023-07-21 DIAGNOSIS — E1351 Other specified diabetes mellitus with diabetic peripheral angiopathy without gangrene: Secondary | ICD-10-CM | POA: Diagnosis not present

## 2023-07-21 DIAGNOSIS — I70203 Unspecified atherosclerosis of native arteries of extremities, bilateral legs: Secondary | ICD-10-CM | POA: Diagnosis not present

## 2023-07-31 DIAGNOSIS — I739 Peripheral vascular disease, unspecified: Secondary | ICD-10-CM | POA: Diagnosis not present

## 2023-08-03 ENCOUNTER — Other Ambulatory Visit: Payer: Self-pay | Admitting: Internal Medicine

## 2023-08-11 DIAGNOSIS — L600 Ingrowing nail: Secondary | ICD-10-CM | POA: Diagnosis not present

## 2023-08-11 DIAGNOSIS — I70203 Unspecified atherosclerosis of native arteries of extremities, bilateral legs: Secondary | ICD-10-CM | POA: Diagnosis not present

## 2023-08-11 DIAGNOSIS — M79674 Pain in right toe(s): Secondary | ICD-10-CM | POA: Diagnosis not present

## 2023-08-11 DIAGNOSIS — E1351 Other specified diabetes mellitus with diabetic peripheral angiopathy without gangrene: Secondary | ICD-10-CM | POA: Diagnosis not present

## 2023-08-11 DIAGNOSIS — G629 Polyneuropathy, unspecified: Secondary | ICD-10-CM | POA: Diagnosis not present

## 2023-08-18 ENCOUNTER — Ambulatory Visit (INDEPENDENT_AMBULATORY_CARE_PROVIDER_SITE_OTHER): Payer: Medicare Other

## 2023-08-18 ENCOUNTER — Ambulatory Visit: Payer: Medicare Other | Admitting: Internal Medicine

## 2023-08-18 VITALS — Ht 72.0 in | Wt 398.0 lb

## 2023-08-18 DIAGNOSIS — Z0000 Encounter for general adult medical examination without abnormal findings: Secondary | ICD-10-CM

## 2023-08-18 DIAGNOSIS — Z85038 Personal history of other malignant neoplasm of large intestine: Secondary | ICD-10-CM

## 2023-08-18 NOTE — Progress Notes (Signed)
 Subjective:   Brandon Schaefer is a 68 y.o. who presents for a Medicare Wellness preventive visit.  Visit Complete: Virtual I connected with  Particia Bolus on 08/18/23 by a audio enabled telemedicine application and verified that I am speaking with the correct person using two identifiers.  Patient Location: Home  Provider Location: Office/Clinic  I discussed the limitations of evaluation and management by telemedicine. The patient expressed understanding and agreed to proceed.  Vital Signs: Because this visit was a virtual/telehealth visit, some criteria may be missing or patient reported. Any vitals not documented were not able to be obtained and vitals that have been documented are patient reported.  VideoDeclined- This patient declined Librarian, academic. Therefore the visit was completed with audio only.  Persons Participating in Visit: Patient.  AWV Questionnaire: Yes: Patient Medicare AWV questionnaire was completed by the patient on 08/14/2023; I have confirmed that all information answered by patient is correct and no changes since this date.  Cardiac Risk Factors include: advanced age (>51men, >11 women);diabetes mellitus;dyslipidemia;hypertension;male gender;obesity (BMI >30kg/m2)     Objective:    Today's Vitals   08/18/23 0927  Weight: (!) 398 lb (180.5 kg)  Height: 6' (1.829 m)   Body mass index is 53.98 kg/m.     08/18/2023    9:26 AM 08/14/2022    9:20 AM 02/19/2022    2:16 PM 01/24/2021    1:08 PM 01/10/2021    6:51 AM 12/26/2020    2:07 PM 11/26/2020    9:07 PM  Advanced Directives  Does Patient Have a Medical Advance Directive? No No No No No No No  Does patient want to make changes to medical advance directive?       No - Patient declined  Would patient like information on creating a medical advance directive? No - Patient declined No - Patient declined No - Patient declined  No - Patient declined  No - Patient declined    Current  Medications (verified) Outpatient Encounter Medications as of 08/18/2023  Medication Sig   aspirin  EC 81 MG tablet Take 81 mg by mouth daily.   carvedilol  (COREG ) 6.25 MG tablet TAKE 1 TABLET BY MOUTH TWICE  DAILY WITH A MEAL   Continuous Glucose Receiver (FREESTYLE LIBRE 2 READER) DEVI Use as directed to check sugars   E11.65   Continuous Glucose Sensor (FREESTYLE LIBRE 14 DAY SENSOR) MISC Use as directed to check sugars  E11.65   insulin  lispro protamine-lispro (HUMALOG  MIX 75/25) (75-25) 100 UNIT/ML SUSP injection INJECT 45 UNITS UNDER THE SKIN EVERY MORNING AND 40 UNITS AT NIGHT   Insulin  Syringe-Needle U-100 (INSULIN  SYRINGE 1CC/31GX5/16") 31G X 5/16" 1 ML MISC USE AS DIRECTED TWICE DAILY FOR INSULIN    metFORMIN  (GLUCOPHAGE ) 1000 MG tablet TAKE 1 TABLET BY MOUTH TWICE  DAILY WITH A MEAL   pregabalin  (LYRICA ) 150 MG capsule TAKE 1 CAPSULE BY MOUTH 3 TIMES  DAILY (MORNING, NOON, AND  BEDTIME)   ramipril  (ALTACE ) 10 MG capsule TAKE 1 CAPSULE BY MOUTH DAILY  BEFORE BREAKFAST   rosuvastatin  (CRESTOR ) 20 MG tablet TAKE 1 TABLET BY MOUTH DAILY  BEFORE BREAKFAST   vitamin B-12 (CYANOCOBALAMIN ) 1000 MCG tablet Take 1 tablet (1,000 mcg total) by mouth every other day.   No facility-administered encounter medications on file as of 08/18/2023.    Allergies (verified) [place] elastic foam strap [attends briefs small] and Adhesive [tape]   History: Past Medical History:  Diagnosis Date   Allergy    Anemia  Anxiety    colon ca dx'd 11/2011   Dental abscess 08/03/2015   Diabetes mellitus    Headache(784.0)    History of blood transfusion    History of kidney stones    Hyperlipidemia    Hypertension    Neuromuscular disorder (HCC)    peripheral neuropathy   Shortness of breath    with exertion   Past Surgical History:  Procedure Laterality Date   COMPLEX WOUND CLOSURE N/A 01/06/2013   Procedure: EXCISION CHRONIC ABDOMINAL WOUND;  Surgeon: Rogena Class, MD;  Location: [facility];  Service:  General;  Laterality: N/A;   CYSTOSCOPY W/ URETERAL STENT PLACEMENT Bilateral 01/04/2013   Procedure: CYSTOSCOPY WITH RETROGRADE PYELOGRAM/Right double J URETERAL STENT PLACEMENT;  Surgeon: Osborn Blaze, MD;  Location: [facility];  Service: Urology;  Laterality: Bilateral;   CYSTOSCOPY W/ URETERAL STENT PLACEMENT Left 11/27/2020   Procedure: CYSTOSCOPY WITH RETROGRADE PYELOGRAM/URETERAL STENT PLACEMENT;  Surgeon: Adelbert Homans, MD;  Location: [facility];  Service: Urology;  Laterality: Left;   CYSTOSCOPY WITH RETROGRADE PYELOGRAM, URETEROSCOPY AND STENT PLACEMENT Right 01/06/2013   Procedure: CYSTOSCOPY WITH RIGHT RETROGRADE PYELOGRAM, URETEROSCOPY AND BILATERAL STENT EXCHANGE, BILATERAL STONE BASKET EXTRACTION ;  Surgeon: Osborn Blaze, MD;  Location: [facility];  Service: Urology;  Laterality: Right;   CYSTOSCOPY WITH RETROGRADE PYELOGRAM, URETEROSCOPY AND STENT PLACEMENT Bilateral 01/10/2021   Procedure: CYSTOSCOPY WITH RETROGRADE PYELOGRAM, URETEROSCOPY AND STENT EXCHANGE, FIRST STAGE;  Surgeon: Osborn Blaze, MD;  Location: [facility];  Service: Urology;  Laterality: Bilateral;   CYSTOSCOPY WITH RETROGRADE PYELOGRAM, URETEROSCOPY AND STENT PLACEMENT Bilateral 01/31/2021   Procedure: CYSTOSCOPY WITH RETROGRADE PYELOGRAM, URETEROSCOPY AND STENT EXCHANGE;  Surgeon: Osborn Blaze, MD;  Location: [facility];  Service: Urology;  Laterality: Bilateral;  90 MINS   FRACTURE SURGERY      ORIF-left radius has pin   HOLMIUM LASER APPLICATION N/A 01/04/2013   Procedure: HOLMIUM LASER APPLICATION;  Surgeon: Osborn Blaze, MD;  Location: [facility];  Service: Urology;  Laterality: N/A;   HOLMIUM LASER APPLICATION Bilateral 01/10/2021   Procedure: HOLMIUM LASER APPLICATION;  Surgeon: Osborn Blaze, MD;  Location: [facility];  Service: Urology;  Laterality: Bilateral;   HOLMIUM LASER APPLICATION Bilateral 01/31/2021   Procedure: HOLMIUM LASER APPLICATION;  Surgeon: Osborn Blaze, MD;  Location: [facility];  Service:  Urology;  Laterality: Bilateral;   NEPHROLITHOTOMY Left 01/04/2013   Procedure: NEPHROLITHOTOMY PERCUTANEOUS  LEFT 1ST STAGE PERCUTANEOUS NEPHROSTOLITHOTOMY;  Surgeon: Osborn Blaze, MD;  Location: [facility];  Service: Urology;  Laterality: Left;   NEPHROLITHOTOMY Left 01/06/2013   Procedure: NEPHROLITHOTOMY PERCUTANEOUS SECOND LOOK;  Surgeon: Osborn Blaze, MD;  Location: [facility];  Service: Urology;  Laterality: Left;   PARTIAL COLECTOMY  01/17/2012   Procedure: PARTIAL COLECTOMY;  Surgeon: Rogena Class, MD;  Location: [facility];  Service: General;;   PILONIDAL CYST EXCISION     PORT-A-CATH REMOVAL Left 01/06/2013   Procedure: REMOVAL PORT-A-CATH;  Surgeon: Rogena Class, MD;  Location: [facility];  Service: General;  Laterality: Left;   PORTACATH PLACEMENT  03/19/2012   Procedure: INSERTION PORT-A-CATH;  Surgeon: Rogena Class, MD;  Location: [facility];  Service: General;  Laterality: N/A;   PROCTOSCOPY  01/17/2012   Procedure: PROCTOSCOPY;  Surgeon: Rogena Class, MD;  Location: [facility];  Service: General;;   Family History  Problem Relation Age of Onset   Diabetes Father    Cancer Maternal Aunt        colon   Cancer Maternal Grandmother  colon   Social History   Socioeconomic History   Marital status: Single    Spouse name: Not on file   Number of children: Not on file   Years of education: 11   Highest education level: 11th grade  Occupational History   Not on file  Tobacco Use   Smoking status: Never    Passive exposure: Never   Smokeless tobacco: Never   Tobacco comments:    NEVER USED TOBACC0  Vaping Use   Vaping status: Never Used  Substance and Sexual Activity   Alcohol use: No    Alcohol/week: 0.0 standard drinks of alcohol   Drug use: No   Sexual activity: Not Currently  Other Topics Concern   Not on file  Social History Narrative   Single   Social Drivers of Health   Financial Resource Strain: Low Risk  (08/18/2023)   Overall  Financial Resource Strain (CARDIA)    Difficulty of Paying Living Expenses: Not hard at all  Food Insecurity: No Food Insecurity (08/18/2023)   Hunger Vital Sign    Worried About Running Out of Food in the Last Year: Never true    Ran Out of Food in the Last Year: Never true  Transportation Needs: No Transportation Needs (08/18/2023)   PRAPARE - Administrator, Civil Service (Medical): No    Lack of Transportation (Non-Medical): No  Physical Activity: Insufficiently Active (08/18/2023)   Exercise Vital Sign    Days of Exercise per Week: 2 days    Minutes of Exercise per Session: 10 min  Stress: No Stress Concern Present (08/18/2023)   [employer] of Occupational Health - Occupational Stress Questionnaire    Feeling of Stress : Not at all  Social Connections: Moderately Isolated (08/18/2023)   Social Connection and Isolation Panel [NHANES]    Frequency of Communication with Friends and Family: More than three times a week    Frequency of Social Gatherings with Friends and Family: More than three times a week    Attends Religious Services: 1 to 4 times per year    Active Member of [employer] or Organizations: No    Attends Engineer, structural: Never    Marital Status: Never married    Tobacco Counseling Counseling given: No Tobacco comments: NEVER USED TOBACC0    Clinical Intake:  Pre-visit preparation completed: Yes  Pain : No/denies pain     BMI - recorded: 53.98 Nutritional Risks: None Diabetes: Yes CBG done?: Yes CBG resulted in Enter/ Edit results?: Yes (fasting - 79) Did pt. bring in CBG monitor from home?: No  Lab Results  Component Value Date   HGBA1C 6.2 05/20/2023   HGBA1C 6.0 02/17/2023   HGBA1C 6.2 08/15/2022     How often do you need to have someone help you when you read instructions, pamphlets, or other written materials from your doctor or pharmacy?: 1 - Never  Interpreter Needed?: No  Information entered by :: Kandy Orris,  CMA   Activities of Daily Living     08/18/2023    9:29 AM 08/14/2023   12:22 PM  In your present state of health, do you have any difficulty performing the following activities:  Hearing? 0 0  Vision? 0 0  Difficulty concentrating or making decisions? 0 0  Walking or climbing stairs? 1 1  Dressing or bathing? 0 0  Doing errands, shopping? 0 0  Preparing Food and eating ? N N  Using the Toilet? N N  In the  past six months, have you accidently leaked urine? Y Y  Do you have problems with loss of bowel control? N N  Managing your Medications? N N  Managing your Finances? N N  Housekeeping or managing your Housekeeping? N N    Patient Care Team: Colene Dauphin, MD as PCP - General (Internal Medicine) Dorean Gambles, OD as Consulting Physician (Optometry) Secundino Dach, Harvey Linen., MD as Consulting Physician (Urology) Radene Buffalo, DPM as Consulting Physician (Podiatry) Maria Shiner, Sherryll Donald, MD as Consulting Physician (Oncology)  Indicate any recent Medical Services you may have received from other than [facility] providers in the past year (date may be approximate).     Assessment:   This is a routine wellness examination for [facility].  Hearing/Vision screen Hearing Screening - Comments:: Denies hearing difficulties   Vision Screening - Comments:: Wears eyeglasses for reading only - pt plans to schedule an appt for a routine diabetic eye exams with [facility]   Goals Addressed               This Visit's Progress     Patient Stated (pt-stated)        Patient stated he wants to stay active - nothing in particular       Depression Screen     08/18/2023    9:31 AM 05/20/2023    8:10 AM 02/17/2023    8:08 AM 08/15/2022    8:28 AM 08/14/2022    9:22 AM 02/19/2022    2:27 PM 02/14/2022    8:33 AM  PHQ 2/9 Scores  PHQ - 2 Score 0 0 0 0 0 0 0  PHQ- 9 Score 0  0 0 0      Fall Risk     08/18/2023    9:33 AM 08/14/2023   12:22 PM 05/20/2023    8:10 AM 02/17/2023    8:07 AM 08/15/2022    8:28  AM  Fall Risk   Falls in the past year? 0 0 0 0 0  Number falls in past yr: 0 0 0 0 0  Injury with Fall? 0 0 0 0 0  Risk for fall due to : No Fall Risks  No Fall Risks No Fall Risks No Fall Risks  Follow up Falls prevention discussed;Falls evaluation completed  Falls evaluation completed Falls evaluation completed Falls evaluation completed    MEDICARE RISK AT HOME:  Medicare Risk at Home Any stairs in or around the home?: Yes If so, are there any without handrails?: No Home free of loose throw rugs in walkways, pet beds, electrical cords, etc?: Yes Adequate lighting in your home to reduce risk of falls?: Yes Life alert?: No Use of a cane, walker or w/c?: Yes (cane) Grab bars in the bathroom?: No Shower chair or bench in shower?: Yes Elevated toilet seat or a handicapped toilet?: No  TIMED UP AND GO:  Was the test performed?  No  Cognitive Function: 6CIT completed        08/18/2023    9:33 AM 08/14/2022    9:25 AM 02/19/2022    2:14 PM  6CIT Screen  What Year? 0 points 0 points 0 points  What month? 0 points 0 points 0 points  What time? 0 points 0 points 0 points  Count back from 20 0 points 0 points 0 points  Months in reverse 0 points 0 points 0 points  Repeat phrase 0 points 0 points 0 points  Total Score 0 points 0 points 0  points    Immunizations Immunization History  Administered Date(s) Administered   Fluad Quad(high Dose 65+) 02/09/2019, 02/10/2020, 02/12/2021, 02/14/2022   Fluad Trivalent(High Dose 65+) 02/17/2023   H1N1 03/22/2008   Influenza Split 01/20/2012   Influenza,inj,Quad PF,6+ Mos 12/22/2014, 12/22/2015, 01/27/2017, 01/27/2018   Influenza-Unspecified 12/31/2012, 01/12/2014   PFIZER(Purple Top)SARS-COV-2 Vaccination 06/06/2019, 06/28/2019   Pneumococcal Conjugate-13 03/16/2013   Pneumococcal Polysaccharide-23 11/15/2009, 01/05/2013, 02/09/2019   Tdap 03/22/2008    Screening Tests Health Maintenance  Topic Date Due   COVID-19 Vaccine (3 -  2024-25 season) 12/15/2022   OPHTHALMOLOGY EXAM  04/28/2023   Zoster Vaccines- Shingrix (1 of 2) 11/18/2023 (Originally 02/02/2004)   DTaP/Tdap/Td (2 - Td or Tdap) 02/17/2024 (Originally 03/22/2018)   Colonoscopy  02/17/2024 (Originally 12/18/2012)   INFLUENZA VACCINE  11/14/2023   HEMOGLOBIN A1C  11/17/2023   FOOT EXAM  12/02/2023   Diabetic kidney evaluation - Urine ACR  02/17/2024   Diabetic kidney evaluation - eGFR measurement  05/19/2024   Medicare Annual Wellness (AWV)  08/17/2024   Pneumonia Vaccine 18+ Years old  Completed   Hepatitis C Screening  Completed   HPV VACCINES  Aged Out   Meningococcal B Vaccine  Aged Out    Health Maintenance  Health Maintenance Due  Topic Date Due   COVID-19 Vaccine (3 - 2024-25 season) 12/15/2022   OPHTHALMOLOGY EXAM  04/28/2023   Health Maintenance Items Addressed: Referral sent to GI for colonoscopy - to Dr Elsie Halo.    Additional Screening:  Vision Screening: Recommended annual ophthalmology exams for early detection of glaucoma and other disorders of the eye.  Dental Screening: Recommended annual dental exams for proper oral hygiene  Community Resource Referral / Chronic Care Management: CRR required this visit?  No   CCM required this visit?  No     Plan:     I have personally reviewed and noted the following in the patient's chart:   Medical and social history Use of alcohol, tobacco or illicit drugs  Current medications and supplements including opioid prescriptions. Patient is not currently taking opioid prescriptions. Functional ability and status Nutritional status Physical activity Advanced directives List of other physicians Hospitalizations, surgeries, and ER visits in previous 12 months Vitals Screenings to include cognitive, depression, and falls Referrals and appointments  In addition, I have reviewed and discussed with patient certain preventive protocols, quality metrics, and best practice recommendations. A  written personalized care plan for preventive services as well as general preventive health recommendations were provided to patient.     Patria Bookbinder, CMA   08/18/2023   After Visit Summary: (MyChart) Due to this being a telephonic visit, the after visit summary with patients personalized plan was offered to patient via MyChart   Notes: Nothing significant to report at this time.

## 2023-08-18 NOTE — Patient Instructions (Signed)
 Brandon Schaefer , Thank you for taking time to come for your Medicare Wellness Visit. I appreciate your ongoing commitment to your health goals. Please review the following plan we discussed and let me know if I can assist you in the future.   Referrals/Orders/Follow-Ups/Clinician Recommendations: Aim for 30 minutes of exercise or brisk walking, 6-8 glasses of water , and 5 servings of fruits and vegetables each day. Due for a repeat Colonoscopy w/Dr Elsie Halo.  Educated and advised on getting the Tdap (Tetenus) and Shingles vaccines in 2025 at local pharmacy.  This is a list of the screening recommended for you and due dates:  Health Maintenance  Topic Date Due   COVID-19 Vaccine (3 - 2024-25 season) 12/15/2022   Eye exam for diabetics  04/28/2023   Zoster (Shingles) Vaccine (1 of 2) 11/18/2023*   DTaP/Tdap/Td vaccine (2 - Td or Tdap) 02/17/2024*   Colon Cancer Screening  02/17/2024*   Flu Shot  11/14/2023   Hemoglobin A1C  11/17/2023   Complete foot exam   12/02/2023   Yearly kidney health urinalysis for diabetes  02/17/2024   Yearly kidney function blood test for diabetes  05/19/2024   Medicare Annual Wellness Visit  08/17/2024   Pneumonia Vaccine  Completed   Hepatitis C Screening  Completed   HPV Vaccine  Aged Out   Meningitis B Vaccine  Aged Out  *Topic was postponed. The date shown is not the original due date.    Advanced directives: (Declined) Advance directive discussed with you today. Even though you declined this today, please call our office should you change your mind, and we can give you the proper paperwork for you to fill out.  Next Medicare Annual Wellness Visit scheduled for next year: Yes  Have you seen your provider in the last 6 months (3 months if uncontrolled diabetes)? Yes

## 2023-08-25 DIAGNOSIS — L03031 Cellulitis of right toe: Secondary | ICD-10-CM | POA: Diagnosis not present

## 2023-09-10 DIAGNOSIS — I70203 Unspecified atherosclerosis of native arteries of extremities, bilateral legs: Secondary | ICD-10-CM | POA: Diagnosis not present

## 2023-09-10 DIAGNOSIS — M79674 Pain in right toe(s): Secondary | ICD-10-CM | POA: Diagnosis not present

## 2023-09-10 DIAGNOSIS — L600 Ingrowing nail: Secondary | ICD-10-CM | POA: Diagnosis not present

## 2023-09-10 DIAGNOSIS — E1351 Other specified diabetes mellitus with diabetic peripheral angiopathy without gangrene: Secondary | ICD-10-CM | POA: Diagnosis not present

## 2023-09-10 DIAGNOSIS — G629 Polyneuropathy, unspecified: Secondary | ICD-10-CM | POA: Diagnosis not present

## 2023-09-15 ENCOUNTER — Ambulatory Visit: Admitting: Internal Medicine

## 2023-10-09 ENCOUNTER — Other Ambulatory Visit: Payer: Self-pay | Admitting: Internal Medicine

## 2023-10-09 DIAGNOSIS — E1142 Type 2 diabetes mellitus with diabetic polyneuropathy: Secondary | ICD-10-CM

## 2023-10-12 NOTE — Patient Instructions (Addendum)
      Blood work was ordered.       Medications changes include :   None    A referral was ordered and someone will call you to schedule an appointment.     Return in about 5 months (around 03/29/2024) for follow up.

## 2023-10-12 NOTE — Progress Notes (Unsigned)
 Subjective:    Patient ID: Brandon Schaefer, male    DOB: [birth year], 68 y.o.   MRN: [id number]     HPI Reymundo is here for follow up of his chronic medical problems.  Had foot surgery - had toenails removed, bone spur - it has healed.  He was not active for a while and has leg weakness and pain from not walking.      Medications and allergies reviewed with patient and updated if appropriate.  Current Outpatient Medications on File Prior to Visit  Medication Sig Dispense Refill   aspirin  EC 81 MG tablet Take 81 mg by mouth daily.     carvedilol  (COREG ) 6.25 MG tablet TAKE 1 TABLET BY MOUTH TWICE  DAILY WITH A MEAL 200 tablet 2   Continuous Glucose Receiver (FREESTYLE LIBRE 2 READER) DEVI Use as directed to check sugars   E11.65 1 each 0   Continuous Glucose Sensor (FREESTYLE LIBRE 14 DAY SENSOR) MISC Use as directed to check sugars  E11.65 28 each 5   insulin  lispro protamine-lispro (HUMALOG  MIX 75/25) (75-25) 100 UNIT/ML SUSP injection INJECT SUBCUTANEOUSLY 45 UNITS  EVERY MORNING AND 40 UNITS AT  NIGHT 90 mL 2   Insulin  Syringe-Needle U-100 (INSULIN  SYRINGE 1CC/31GX5/16) 31G X 5/16 1 ML MISC USE AS DIRECTED TWICE DAILY FOR INSULIN  200 each 2   metFORMIN  (GLUCOPHAGE ) 1000 MG tablet TAKE 1 TABLET BY MOUTH TWICE  DAILY WITH A MEAL 200 tablet 2   ramipril  (ALTACE ) 10 MG capsule TAKE 1 CAPSULE BY MOUTH DAILY  BEFORE BREAKFAST 100 capsule 2   rosuvastatin  (CRESTOR ) 20 MG tablet TAKE 1 TABLET BY MOUTH DAILY  BEFORE BREAKFAST 100 tablet 2   vitamin B-12 (CYANOCOBALAMIN ) 1000 MCG tablet Take 1 tablet (1,000 mcg total) by mouth every other day.     No current facility-administered medications on file prior to visit.     Review of Systems  Constitutional:  Negative for fever.  Respiratory:  Negative for cough, shortness of breath and wheezing.   Cardiovascular:  Positive for leg swelling (a little since not walking as much). Negative for chest pain and palpitations.  Neurological:   Negative for light-headedness and headaches.       Objective:   Vitals:   10/14/23 0749  BP: 138/82  Pulse: (!) 111  Temp: 98.3 F (36.8 C)  SpO2: 95%   BP Readings from Last 3 Encounters:  10/14/23 138/82  05/20/23 136/78  02/17/23 134/70   Wt Readings from Last 3 Encounters:  10/14/23 (!) 407 lb (184.6 kg)  08/18/23 (!) 398 lb (180.5 kg)  05/20/23 (!) 398 lb (180.5 kg)   Body mass index is 55.2 kg/m.    Physical Exam Constitutional:      General: He is not in acute distress.    Appearance: Normal appearance. He is not ill-appearing.  HENT:     Head: Normocephalic and atraumatic.   Eyes:     Conjunctiva/sclera: Conjunctivae normal.    Cardiovascular:     Rate and Rhythm: Normal rate and regular rhythm.     Heart sounds: Normal heart sounds.  Pulmonary:     Effort: Pulmonary effort is normal. No respiratory distress.     Breath sounds: Normal breath sounds. No wheezing or rales.   Musculoskeletal:     Right lower leg: No edema.     Left lower leg: No edema.   Skin:    General: Skin is warm and dry.     Findings:  No rash.   Neurological:     Mental Status: He is alert. Mental status is at baseline.   Psychiatric:        Mood and Affect: Mood normal.        Lab Results  Component Value Date   WBC 9.3 02/17/2023   HGB 14.7 02/17/2023   HCT 44.7 02/17/2023   PLT 262.0 02/17/2023   GLUCOSE 152 (H) 05/20/2023   CHOL 88 05/20/2023   TRIG 141.0 05/20/2023   HDL 36.60 (L) 05/20/2023   LDLCALC 23 05/20/2023   ALT 18 05/20/2023   AST 17 05/20/2023   NA 142 05/20/2023   K 4.3 05/20/2023   CL 105 05/20/2023   CREATININE 0.94 05/20/2023   BUN 18 05/20/2023   CO2 23 05/20/2023   TSH 3.55 08/14/2021   PSA 0.23 08/14/2021   HGBA1C 6.2 05/20/2023     Assessment & Plan:    See Problem List for Assessment and Plan of chronic medical problems.

## 2023-10-14 ENCOUNTER — Encounter: Payer: Self-pay | Admitting: Internal Medicine

## 2023-10-14 ENCOUNTER — Ambulatory Visit (INDEPENDENT_AMBULATORY_CARE_PROVIDER_SITE_OTHER): Admitting: Internal Medicine

## 2023-10-14 VITALS — BP 124/80 | HR 111 | Temp 98.3°F | Ht 72.0 in | Wt >= 6400 oz

## 2023-10-14 DIAGNOSIS — E1142 Type 2 diabetes mellitus with diabetic polyneuropathy: Secondary | ICD-10-CM

## 2023-10-14 DIAGNOSIS — I10 Essential (primary) hypertension: Secondary | ICD-10-CM | POA: Diagnosis not present

## 2023-10-14 DIAGNOSIS — E7849 Other hyperlipidemia: Secondary | ICD-10-CM | POA: Diagnosis not present

## 2023-10-14 DIAGNOSIS — I2510 Atherosclerotic heart disease of native coronary artery without angina pectoris: Secondary | ICD-10-CM

## 2023-10-14 DIAGNOSIS — G622 Polyneuropathy due to other toxic agents: Secondary | ICD-10-CM

## 2023-10-14 DIAGNOSIS — E538 Deficiency of other specified B group vitamins: Secondary | ICD-10-CM | POA: Diagnosis not present

## 2023-10-14 DIAGNOSIS — Z794 Long term (current) use of insulin: Secondary | ICD-10-CM

## 2023-10-14 DIAGNOSIS — Z6841 Body Mass Index (BMI) 40.0 and over, adult: Secondary | ICD-10-CM

## 2023-10-14 LAB — HEMOGLOBIN A1C: Hgb A1c MFr Bld: 6.4 % (ref 4.6–6.5)

## 2023-10-14 LAB — MICROALBUMIN / CREATININE URINE RATIO
Creatinine,U: 150.5 mg/dL
Microalb Creat Ratio: 23.9 mg/g (ref 0.0–30.0)
Microalb, Ur: 3.6 mg/dL — ABNORMAL HIGH (ref 0.0–1.9)

## 2023-10-14 LAB — LIPID PANEL
Cholesterol: 86 mg/dL (ref 0–200)
HDL: 33.6 mg/dL — ABNORMAL LOW (ref >39.00)
LDL Cholesterol: 25 mg/dL (ref 0–99)
NonHDL: 52
Total CHOL/HDL Ratio: 3
Triglycerides: 136 mg/dL (ref 0.0–149.0)
VLDL: 27.2 mg/dL (ref 0.0–40.0)

## 2023-10-14 LAB — COMPREHENSIVE METABOLIC PANEL WITH GFR
ALT: 17 U/L (ref 0–53)
AST: 16 U/L (ref 0–37)
Albumin: 4.2 g/dL (ref 3.5–5.2)
Alkaline Phosphatase: 61 U/L (ref 39–117)
BUN: 18 mg/dL (ref 6–23)
CO2: 26 meq/L (ref 19–32)
Calcium: 9.5 mg/dL (ref 8.4–10.5)
Chloride: 103 meq/L (ref 96–112)
Creatinine, Ser: 0.92 mg/dL (ref 0.40–1.50)
GFR: 84.76 mL/min (ref >60.00)
Glucose, Bld: 158 mg/dL — ABNORMAL HIGH (ref 70–99)
Potassium: 4.4 meq/L (ref 3.5–5.1)
Sodium: 141 meq/L (ref 135–145)
Total Bilirubin: 0.6 mg/dL (ref 0.2–1.2)
Total Protein: 7.1 g/dL (ref 6.0–8.3)

## 2023-10-14 LAB — VITAMIN B12: Vitamin B-12: 273 pg/mL (ref 211–911)

## 2023-10-14 MED ORDER — PREGABALIN 200 MG PO CAPS: 200.0000 mg | ORAL_CAPSULE | Freq: Three times a day (TID) | ORAL | 0 refills | Status: DC

## 2023-10-14 NOTE — Assessment & Plan Note (Signed)
Chronic Check lipid panel Continue rosuvastatin 20 mg daily Encouraged healthy diet, regular exercise and weight loss

## 2023-10-14 NOTE — Assessment & Plan Note (Signed)
 Chronic Encouraged weight loss  Mounjaro prescribed last fall-too expensive Stressed decreased portions, diet high in protein and low in sugars and carbs Encouraged this much activity/exercise as possible

## 2023-10-14 NOTE — Assessment & Plan Note (Addendum)
 Chronic Blood pressure borderline - will recheck  CMP Continue Coreg  6.25 mg twice daily, ramipril  10 mg daily

## 2023-10-14 NOTE — Assessment & Plan Note (Signed)
 Chronic Pain in bilateral feet Continue Lyrica  150 mg 3 times daily

## 2023-10-14 NOTE — Assessment & Plan Note (Addendum)
 Chronic Lab Results  Component Value Date   HGBA1C 6.2 05/20/2023   Sugars well controlled Check A1c , urine albumin/cr ratio Continue metformin  1000 mg twice daily, Humalog  mix 75/25  45 units every morning, 40 units at night Stressed regular exercise - has been less active due to foot surgery - will increase his walking diabetic diet

## 2023-10-14 NOTE — Assessment & Plan Note (Signed)
 Chronic Continue taking B12 daily Check B12 level

## 2023-10-14 NOTE — Assessment & Plan Note (Signed)
 Chronic Evidence of CAD on previous imaging No chest pain Continue aspirin  81 mg daily, Coreg  6.25 mg twice daily, rosuvastatin  20 mg daily Would benefit greatly from weight loss

## 2023-10-15 ENCOUNTER — Ambulatory Visit: Payer: Self-pay | Admitting: Internal Medicine

## 2023-11-10 DIAGNOSIS — M79675 Pain in left toe(s): Secondary | ICD-10-CM | POA: Diagnosis not present

## 2023-11-10 DIAGNOSIS — M79674 Pain in right toe(s): Secondary | ICD-10-CM | POA: Diagnosis not present

## 2023-11-10 DIAGNOSIS — G629 Polyneuropathy, unspecified: Secondary | ICD-10-CM | POA: Diagnosis not present

## 2023-11-10 DIAGNOSIS — B351 Tinea unguium: Secondary | ICD-10-CM | POA: Diagnosis not present

## 2023-11-10 DIAGNOSIS — E1142 Type 2 diabetes mellitus with diabetic polyneuropathy: Secondary | ICD-10-CM | POA: Diagnosis not present

## 2023-11-10 DIAGNOSIS — L603 Nail dystrophy: Secondary | ICD-10-CM | POA: Diagnosis not present

## 2023-12-10 DIAGNOSIS — E119 Type 2 diabetes mellitus without complications: Secondary | ICD-10-CM | POA: Diagnosis not present

## 2024-01-01 ENCOUNTER — Other Ambulatory Visit: Payer: Self-pay | Admitting: Internal Medicine

## 2024-01-15 ENCOUNTER — Ambulatory Visit: Admitting: Internal Medicine

## 2024-01-18 ENCOUNTER — Encounter: Payer: Self-pay | Admitting: Internal Medicine

## 2024-01-18 NOTE — Patient Instructions (Addendum)
    Flu immunization administered today.      Blood work was ordered.       Medications changes include :   None     Return in about 6 months (around 07/19/2024) for Physical Exam.

## 2024-01-18 NOTE — Progress Notes (Unsigned)
 Subjective:    Patient ID: Brandon Schaefer, male    DOB: [birth year], 68 y.o.   MRN: [id number]     HPI [name] is here for follow up of his chronic medical problems.  Sugars avg 150.-A little higher than usual-not sure why.  Stomach messed up x 2-3 weeks.  Having diarrhea and gas, bloating.  No change in meds or diet.  He is not sure what caused it.  Improving.    Medications and allergies reviewed with patient and updated if appropriate.  Current Outpatient Medications on File Prior to Visit  Medication Sig Dispense Refill   aspirin  EC 81 MG tablet Take 81 mg by mouth daily.     carvedilol  (COREG ) 6.25 MG tablet TAKE 1 TABLET BY MOUTH TWICE  DAILY WITH A MEAL 200 tablet 2   Continuous Glucose Receiver (FREESTYLE LIBRE 2 READER) DEVI Use as directed to check sugars   E11.65 1 each 0   Continuous Glucose Sensor (FREESTYLE LIBRE 14 DAY SENSOR) MISC Use as directed to check sugars  E11.65 28 each 5   insulin  lispro protamine-lispro (HUMALOG  MIX 75/25) (75-25) 100 UNIT/ML SUSP injection INJECT SUBCUTANEOUSLY 45 UNITS  EVERY MORNING AND 40 UNITS AT  NIGHT 90 mL 2   Insulin  Syringe-Needle U-100 (INSULIN  SYRINGE 1CC/31GX5/16) 31G X 5/16 1 ML MISC USE AS DIRECTED TWICE DAILY FOR INSULIN  200 each 2   metFORMIN  (GLUCOPHAGE ) 1000 MG tablet TAKE 1 TABLET BY MOUTH TWICE  DAILY WITH A MEAL 200 tablet 2   pregabalin  (LYRICA ) 200 MG capsule TAKE 1 CAPSULE(200 MG) BY MOUTH THREE TIMES DAILY 270 capsule 0   ramipril  (ALTACE ) 10 MG capsule TAKE 1 CAPSULE BY MOUTH DAILY  BEFORE BREAKFAST 100 capsule 2   rosuvastatin  (CRESTOR ) 20 MG tablet TAKE 1 TABLET BY MOUTH DAILY  BEFORE BREAKFAST 100 tablet 2   vitamin B-12 (CYANOCOBALAMIN ) 1000 MCG tablet Take 1 tablet (1,000 mcg total) by mouth every other day.     No current facility-administered medications on file prior to visit.     Review of Systems  Constitutional:  Negative for fever.  Respiratory:  Negative for cough, shortness of breath and  wheezing.   Cardiovascular:  Negative for chest pain, palpitations and leg swelling.  Gastrointestinal:  Positive for diarrhea. Negative for abdominal pain.       No gerd  Neurological:  Negative for light-headedness and headaches.       Objective:   Vitals:   01/19/24 0901  BP: 130/80  Pulse: 100  Temp: (!) 97.3 F (36.3 C)  SpO2: 90%   BP Readings from Last 3 Encounters:  01/19/24 130/80  10/14/23 124/80  05/20/23 136/78   Wt Readings from Last 3 Encounters:  01/19/24 (!) 408 lb 14.4 oz (185.5 kg)  10/14/23 (!) 407 lb (184.6 kg)  08/18/23 (!) 398 lb (180.5 kg)   Body mass index is 55.46 kg/m.    Physical Exam Constitutional:      General: He is not in acute distress.    Appearance: Normal appearance. He is not ill-appearing.  HENT:     Head: Normocephalic and atraumatic.  Eyes:     Conjunctiva/sclera: Conjunctivae normal.  Cardiovascular:     Rate and Rhythm: Normal rate and regular rhythm.     Heart sounds: Normal heart sounds.  Pulmonary:     Effort: Pulmonary effort is normal. No respiratory distress.     Breath sounds: Normal breath sounds. No wheezing or rales.  Musculoskeletal:  Right lower leg: No edema.     Left lower leg: No edema.  Skin:    General: Skin is warm and dry.     Findings: No rash.  Neurological:     Mental Status: He is alert. Mental status is at baseline.  Psychiatric:        Mood and Affect: Mood normal.        Lab Results  Component Value Date   WBC 9.3 02/17/2023   HGB 14.7 02/17/2023   HCT 44.7 02/17/2023   PLT 262.0 02/17/2023   GLUCOSE 158 (H) 10/14/2023   CHOL 86 10/14/2023   TRIG 136.0 10/14/2023   HDL 33.60 (L) 10/14/2023   LDLCALC 25 10/14/2023   ALT 17 10/14/2023   AST 16 10/14/2023   NA 141 10/14/2023   K 4.4 10/14/2023   CL 103 10/14/2023   CREATININE 0.92 10/14/2023   BUN 18 10/14/2023   CO2 26 10/14/2023   TSH 3.55 08/14/2021   PSA 0.23 08/14/2021   HGBA1C 6.4 10/14/2023   MICROALBUR 3.6 (H)  10/14/2023     Assessment & Plan:    See Problem List for Assessment and Plan of chronic medical problems.

## 2024-01-19 ENCOUNTER — Ambulatory Visit (INDEPENDENT_AMBULATORY_CARE_PROVIDER_SITE_OTHER): Admitting: Internal Medicine

## 2024-01-19 ENCOUNTER — Encounter: Payer: Self-pay | Admitting: Internal Medicine

## 2024-01-19 VITALS — BP 130/80 | HR 100 | Temp 97.3°F | Ht 72.0 in | Wt >= 6400 oz

## 2024-01-19 DIAGNOSIS — I2510 Atherosclerotic heart disease of native coronary artery without angina pectoris: Secondary | ICD-10-CM | POA: Diagnosis not present

## 2024-01-19 DIAGNOSIS — E119 Type 2 diabetes mellitus without complications: Secondary | ICD-10-CM | POA: Insufficient documentation

## 2024-01-19 DIAGNOSIS — E785 Hyperlipidemia, unspecified: Secondary | ICD-10-CM

## 2024-01-19 DIAGNOSIS — I152 Hypertension secondary to endocrine disorders: Secondary | ICD-10-CM

## 2024-01-19 DIAGNOSIS — Z794 Long term (current) use of insulin: Secondary | ICD-10-CM

## 2024-01-19 DIAGNOSIS — G622 Polyneuropathy due to other toxic agents: Secondary | ICD-10-CM | POA: Diagnosis not present

## 2024-01-19 DIAGNOSIS — Z125 Encounter for screening for malignant neoplasm of prostate: Secondary | ICD-10-CM

## 2024-01-19 DIAGNOSIS — Z23 Encounter for immunization: Secondary | ICD-10-CM | POA: Diagnosis not present

## 2024-01-19 DIAGNOSIS — E1159 Type 2 diabetes mellitus with other circulatory complications: Secondary | ICD-10-CM

## 2024-01-19 DIAGNOSIS — Z6841 Body Mass Index (BMI) 40.0 and over, adult: Secondary | ICD-10-CM

## 2024-01-19 DIAGNOSIS — E1169 Type 2 diabetes mellitus with other specified complication: Secondary | ICD-10-CM | POA: Diagnosis not present

## 2024-01-19 DIAGNOSIS — E538 Deficiency of other specified B group vitamins: Secondary | ICD-10-CM

## 2024-01-19 NOTE — Assessment & Plan Note (Signed)
 Chronic Continue taking B12 daily Check B12 level

## 2024-01-19 NOTE — Assessment & Plan Note (Signed)
 Chronic Evidence of CAD on previous imaging No chest pain Continue aspirin  81 mg daily, Coreg  6.25 mg twice daily, rosuvastatin  20 mg daily Would benefit greatly from weight loss

## 2024-01-19 NOTE — Assessment & Plan Note (Addendum)
 Chronic Associated with coronary artery disease, hyperlipidemia  Lab Results  Component Value Date   HGBA1C 6.4 10/14/2023   Sugars well controlled Check A1c Continue metformin  1000 mg twice daily, Humalog  mix 75/25  45 units every morning, 40 units at night Stressed regular exercise  diabetic diet

## 2024-01-19 NOTE — Assessment & Plan Note (Signed)
 Chronic Blood pressure controlled CMP Continue Coreg  6.25 mg twice daily, ramipril  10 mg daily

## 2024-01-19 NOTE — Assessment & Plan Note (Signed)
 Chronic Morbidly obese Encouraged weight loss

## 2024-01-19 NOTE — Assessment & Plan Note (Signed)
 Chronic Encouraged weight loss  Mounjaro  prescribed last year-too expensive Stressed decreased portions, diet high in protein and low in sugars and carbs Encouraged this much activity/exercise as possible

## 2024-01-19 NOTE — Assessment & Plan Note (Signed)
 Chronic Pain in bilateral feet Continue Lyrica  200 mg 3 times daily

## 2024-01-19 NOTE — Assessment & Plan Note (Signed)
 Chronic Check lipid panel, CMP Continue rosuvastatin  20 mg daily Encouraged healthy diet, regular exercise and weight loss

## 2024-01-21 LAB — CBC
HCT: 42.9 % (ref 39.0–52.0)
Hemoglobin: 14.3 g/dL (ref 13.0–17.0)
MCHC: 33.3 g/dL (ref 30.0–36.0)
MCV: 87.7 fl (ref 78.0–100.0)
Platelets: 240 K/uL (ref 150.0–400.0)
RBC: 4.9 Mil/uL (ref 4.22–5.81)
RDW: 14.2 % (ref 11.5–15.5)
WBC: 6.9 K/uL (ref 4.0–10.5)

## 2024-01-21 LAB — LIPID PANEL
Cholesterol: 79 mg/dL (ref 0–200)
HDL: 32.1 mg/dL — ABNORMAL LOW (ref >39.00)
LDL Cholesterol: 7 mg/dL (ref 0–99)
NonHDL: 46.48
Total CHOL/HDL Ratio: 2
Triglycerides: 199 mg/dL — ABNORMAL HIGH (ref 0.0–149.0)
VLDL: 39.8 mg/dL (ref 0.0–40.0)

## 2024-01-21 LAB — COMPREHENSIVE METABOLIC PANEL WITH GFR
ALT: 19 U/L (ref 0–53)
AST: 18 U/L (ref 0–37)
Albumin: 4.2 g/dL (ref 3.5–5.2)
Alkaline Phosphatase: 54 U/L (ref 39–117)
BUN: 19 mg/dL (ref 6–23)
CO2: 26 meq/L (ref 19–32)
Calcium: 9 mg/dL (ref 8.4–10.5)
Chloride: 102 meq/L (ref 96–112)
Creatinine, Ser: 0.92 mg/dL (ref 0.40–1.50)
GFR: 84.6 mL/min (ref >60.00)
Glucose, Bld: 170 mg/dL — ABNORMAL HIGH (ref 70–99)
Potassium: 4.7 meq/L (ref 3.5–5.1)
Sodium: 142 meq/L (ref 135–145)
Total Bilirubin: 0.6 mg/dL (ref 0.2–1.2)
Total Protein: 6.3 g/dL (ref 6.0–8.3)

## 2024-01-21 LAB — VITAMIN B12: Vitamin B-12: 195 pg/mL — ABNORMAL LOW (ref 211–911)

## 2024-01-21 LAB — PSA, MEDICARE: PSA: 0.34 ng/mL (ref 0.10–4.00)

## 2024-01-21 LAB — HEMOGLOBIN A1C: Hgb A1c MFr Bld: 6.5 % (ref 4.6–6.5)

## 2024-03-08 ENCOUNTER — Other Ambulatory Visit: Payer: Self-pay | Admitting: Internal Medicine

## 2024-03-15 ENCOUNTER — Telehealth: Payer: Self-pay

## 2024-03-15 NOTE — Telephone Encounter (Signed)
 Copied from CRM [id number]. Topic: Clinical - Medication Question >> Mar 15, 2024 11:35 AM Thersia BROCKS wrote: Reason for CRM: [facility] valid prescription from doctor for Continuous Glucose Receiver (FREESTYLE LIBRE 2 READER) DEVI  Fax Number [phone]

## 2024-03-18 ENCOUNTER — Other Ambulatory Visit: Payer: Self-pay

## 2024-03-18 DIAGNOSIS — E1169 Type 2 diabetes mellitus with other specified complication: Secondary | ICD-10-CM

## 2024-03-18 MED ORDER — FREESTYLE LIBRE 2 READER DEVI: Status: DC

## 2024-03-18 MED ORDER — FREESTYLE LIBRE 2 READER DEVI: 12 refills | Status: AC

## 2024-03-18 NOTE — Telephone Encounter (Signed)
 Faxed today

## 2024-03-30 ENCOUNTER — Other Ambulatory Visit: Payer: Self-pay | Admitting: Internal Medicine

## 2024-04-04 ENCOUNTER — Other Ambulatory Visit: Payer: Self-pay | Admitting: Internal Medicine

## 2024-05-20 ENCOUNTER — Other Ambulatory Visit: Payer: Self-pay | Admitting: Internal Medicine

## 2024-05-20 DIAGNOSIS — E1142 Type 2 diabetes mellitus with diabetic polyneuropathy: Secondary | ICD-10-CM

## 2024-07-23 ENCOUNTER — Encounter: Admitting: Internal Medicine

## 2024-08-19 ENCOUNTER — Ambulatory Visit
